# Patient Record
Sex: Male | Born: 1940
Health system: Southern US, Community
[De-identification: ages and names within clinical notes are randomized; demographics above are authoritative.]

## PROBLEM LIST (undated history)

## (undated) DIAGNOSIS — E785 Hyperlipidemia, unspecified: Secondary | ICD-10-CM

## (undated) DIAGNOSIS — R55 Syncope and collapse: Secondary | ICD-10-CM

## (undated) DIAGNOSIS — M1990 Unspecified osteoarthritis, unspecified site: Secondary | ICD-10-CM

## (undated) DIAGNOSIS — I77810 Thoracic aortic ectasia: Secondary | ICD-10-CM

## (undated) DIAGNOSIS — F32A Depression, unspecified: Secondary | ICD-10-CM

## (undated) DIAGNOSIS — R0789 Other chest pain: Secondary | ICD-10-CM

## (undated) DIAGNOSIS — I2699 Other pulmonary embolism without acute cor pulmonale: Secondary | ICD-10-CM

## (undated) DIAGNOSIS — F329 Major depressive disorder, single episode, unspecified: Secondary | ICD-10-CM

## (undated) DIAGNOSIS — Z8639 Personal history of other endocrine, nutritional and metabolic disease: Secondary | ICD-10-CM

## (undated) DIAGNOSIS — M1A9XX0 Chronic gout, unspecified, without tophus (tophi): Secondary | ICD-10-CM

## (undated) DIAGNOSIS — K2970 Gastritis, unspecified, without bleeding: Secondary | ICD-10-CM

## (undated) DIAGNOSIS — J4520 Mild intermittent asthma, uncomplicated: Secondary | ICD-10-CM

## (undated) DIAGNOSIS — I10 Essential (primary) hypertension: Secondary | ICD-10-CM

## (undated) DIAGNOSIS — M259 Joint disorder, unspecified: Secondary | ICD-10-CM

## (undated) DIAGNOSIS — K08109 Complete loss of teeth, unspecified cause, unspecified class: Secondary | ICD-10-CM

## (undated) DIAGNOSIS — N400 Enlarged prostate without lower urinary tract symptoms: Secondary | ICD-10-CM

## (undated) DIAGNOSIS — N401 Enlarged prostate with lower urinary tract symptoms: Secondary | ICD-10-CM

## (undated) DIAGNOSIS — K529 Noninfective gastroenteritis and colitis, unspecified: Secondary | ICD-10-CM

## (undated) DIAGNOSIS — E7800 Pure hypercholesterolemia, unspecified: Secondary | ICD-10-CM

## (undated) DIAGNOSIS — Z973 Presence of spectacles and contact lenses: Secondary | ICD-10-CM

## (undated) DIAGNOSIS — Z8719 Personal history of other diseases of the digestive system: Secondary | ICD-10-CM

## (undated) DIAGNOSIS — J45909 Unspecified asthma, uncomplicated: Secondary | ICD-10-CM

## (undated) DIAGNOSIS — K5792 Diverticulitis of intestine, part unspecified, without perforation or abscess without bleeding: Secondary | ICD-10-CM

## (undated) HISTORY — DX: Depression, unspecified: F32.A

## (undated) HISTORY — DX: Benign prostatic hyperplasia without lower urinary tract symptoms: N40.0

## (undated) HISTORY — DX: Thoracic aortic ectasia: I77.810

## (undated) HISTORY — DX: Syncope and collapse: R55

## (undated) HISTORY — DX: Unspecified osteoarthritis, unspecified site: M19.90

## (undated) HISTORY — DX: Personal history of other endocrine, nutritional and metabolic disease: Z86.39

## (undated) HISTORY — DX: Joint disorder, unspecified: M25.9

## (undated) HISTORY — DX: Other pulmonary embolism without acute cor pulmonale: I26.99

## (undated) HISTORY — DX: Pure hypercholesterolemia, unspecified: E78.00

## (undated) HISTORY — DX: Major depressive disorder, single episode, unspecified: F32.9

## (undated) HISTORY — DX: Gastritis, unspecified, without bleeding: K29.70

## (undated) HISTORY — DX: Diverticulitis of intestine, part unspecified, without perforation or abscess without bleeding: K57.92

## (undated) HISTORY — DX: Other chest pain: R07.89

## (undated) HISTORY — DX: Noninfective gastroenteritis and colitis, unspecified: K52.9

## (undated) HISTORY — DX: Unspecified asthma, uncomplicated: J45.909

## (undated) HISTORY — DX: Essential (primary) hypertension: I10

---

## 1998-01-28 HISTORY — PX: KNEE ARTHROSCOPY: SUR90

## 1999-01-16 ENCOUNTER — Encounter: Payer: Self-pay | Admitting: Internal Medicine

## 1999-01-16 ENCOUNTER — Ambulatory Visit (HOSPITAL_COMMUNITY): Admission: RE | Admit: 1999-01-16 | Discharge: 1999-01-16 | Payer: Self-pay | Admitting: Internal Medicine

## 1999-04-03 ENCOUNTER — Encounter: Payer: Self-pay | Admitting: Emergency Medicine

## 1999-04-03 ENCOUNTER — Emergency Department (HOSPITAL_COMMUNITY): Admission: EM | Admit: 1999-04-03 | Discharge: 1999-04-03 | Payer: Self-pay | Admitting: Emergency Medicine

## 2000-04-30 ENCOUNTER — Encounter: Payer: Self-pay | Admitting: Emergency Medicine

## 2000-04-30 ENCOUNTER — Emergency Department (HOSPITAL_COMMUNITY): Admission: EM | Admit: 2000-04-30 | Discharge: 2000-04-30 | Payer: Self-pay | Admitting: Emergency Medicine

## 2000-05-27 ENCOUNTER — Encounter: Payer: Self-pay | Admitting: Orthopedic Surgery

## 2000-05-27 ENCOUNTER — Encounter: Admission: RE | Admit: 2000-05-27 | Discharge: 2000-05-27 | Payer: Self-pay | Admitting: Orthopedic Surgery

## 2001-07-02 ENCOUNTER — Encounter: Admission: RE | Admit: 2001-07-02 | Discharge: 2001-07-02 | Payer: Self-pay | Admitting: Internal Medicine

## 2001-09-01 ENCOUNTER — Encounter: Admission: RE | Admit: 2001-09-01 | Discharge: 2001-09-01 | Payer: Self-pay | Admitting: Internal Medicine

## 2001-09-07 ENCOUNTER — Encounter: Admission: RE | Admit: 2001-09-07 | Discharge: 2001-09-07 | Payer: Self-pay | Admitting: Internal Medicine

## 2001-09-15 ENCOUNTER — Encounter: Admission: RE | Admit: 2001-09-15 | Discharge: 2001-09-15 | Payer: Self-pay | Admitting: Internal Medicine

## 2001-10-09 ENCOUNTER — Encounter: Admission: RE | Admit: 2001-10-09 | Discharge: 2001-10-09 | Payer: Self-pay | Admitting: Internal Medicine

## 2002-01-05 ENCOUNTER — Encounter: Admission: RE | Admit: 2002-01-05 | Discharge: 2002-01-05 | Payer: Self-pay | Admitting: Internal Medicine

## 2002-01-15 ENCOUNTER — Encounter: Admission: RE | Admit: 2002-01-15 | Discharge: 2002-01-15 | Payer: Self-pay | Admitting: Internal Medicine

## 2002-02-02 ENCOUNTER — Encounter: Admission: RE | Admit: 2002-02-02 | Discharge: 2002-02-02 | Payer: Self-pay | Admitting: Internal Medicine

## 2002-02-09 ENCOUNTER — Ambulatory Visit (HOSPITAL_COMMUNITY): Admission: RE | Admit: 2002-02-09 | Discharge: 2002-02-09 | Payer: Self-pay | Admitting: Internal Medicine

## 2002-02-09 ENCOUNTER — Encounter: Admission: RE | Admit: 2002-02-09 | Discharge: 2002-02-09 | Payer: Self-pay | Admitting: Internal Medicine

## 2002-04-12 ENCOUNTER — Encounter: Admission: RE | Admit: 2002-04-12 | Discharge: 2002-04-12 | Payer: Self-pay | Admitting: Internal Medicine

## 2002-04-26 ENCOUNTER — Encounter: Admission: RE | Admit: 2002-04-26 | Discharge: 2002-04-26 | Payer: Self-pay | Admitting: Internal Medicine

## 2002-04-26 ENCOUNTER — Ambulatory Visit (HOSPITAL_COMMUNITY): Admission: RE | Admit: 2002-04-26 | Discharge: 2002-04-26 | Payer: Self-pay | Admitting: Internal Medicine

## 2002-04-26 ENCOUNTER — Encounter: Payer: Self-pay | Admitting: Internal Medicine

## 2002-05-04 ENCOUNTER — Encounter: Admission: RE | Admit: 2002-05-04 | Discharge: 2002-05-04 | Payer: Self-pay | Admitting: Internal Medicine

## 2002-06-16 ENCOUNTER — Encounter: Admission: RE | Admit: 2002-06-16 | Discharge: 2002-06-16 | Payer: Self-pay | Admitting: Internal Medicine

## 2002-08-11 ENCOUNTER — Ambulatory Visit (HOSPITAL_COMMUNITY): Admission: RE | Admit: 2002-08-11 | Discharge: 2002-08-11 | Payer: Self-pay | Admitting: Internal Medicine

## 2002-08-11 ENCOUNTER — Encounter: Admission: RE | Admit: 2002-08-11 | Discharge: 2002-08-11 | Payer: Self-pay | Admitting: Internal Medicine

## 2002-08-11 ENCOUNTER — Encounter: Payer: Self-pay | Admitting: Internal Medicine

## 2002-08-19 ENCOUNTER — Encounter: Payer: Self-pay | Admitting: Internal Medicine

## 2002-08-19 ENCOUNTER — Ambulatory Visit (HOSPITAL_COMMUNITY): Admission: RE | Admit: 2002-08-19 | Discharge: 2002-08-19 | Payer: Self-pay | Admitting: Internal Medicine

## 2002-09-01 ENCOUNTER — Encounter: Admission: RE | Admit: 2002-09-01 | Discharge: 2002-09-01 | Payer: Self-pay | Admitting: Internal Medicine

## 2002-12-29 ENCOUNTER — Encounter: Admission: RE | Admit: 2002-12-29 | Discharge: 2002-12-29 | Payer: Self-pay | Admitting: Internal Medicine

## 2003-01-29 DIAGNOSIS — Z8719 Personal history of other diseases of the digestive system: Secondary | ICD-10-CM

## 2003-01-29 HISTORY — DX: Personal history of other diseases of the digestive system: Z87.19

## 2003-03-23 ENCOUNTER — Ambulatory Visit (HOSPITAL_COMMUNITY): Admission: RE | Admit: 2003-03-23 | Discharge: 2003-03-23 | Payer: Self-pay | Admitting: Internal Medicine

## 2003-03-23 ENCOUNTER — Encounter: Admission: RE | Admit: 2003-03-23 | Discharge: 2003-03-23 | Payer: Self-pay | Admitting: Internal Medicine

## 2003-03-29 HISTORY — PX: TRANSURETHRAL RESECTION OF PROSTATE: SHX73

## 2003-03-30 ENCOUNTER — Emergency Department (HOSPITAL_COMMUNITY): Admission: EM | Admit: 2003-03-30 | Discharge: 2003-03-30 | Payer: Self-pay | Admitting: Emergency Medicine

## 2003-04-01 ENCOUNTER — Inpatient Hospital Stay (HOSPITAL_COMMUNITY): Admission: RE | Admit: 2003-04-01 | Discharge: 2003-04-03 | Payer: Self-pay | Admitting: Urology

## 2003-04-01 ENCOUNTER — Encounter (INDEPENDENT_AMBULATORY_CARE_PROVIDER_SITE_OTHER): Payer: Self-pay | Admitting: *Deleted

## 2003-04-01 HISTORY — PX: TRANSURETHRAL RESECTION OF PROSTATE: SHX73

## 2003-08-25 ENCOUNTER — Encounter: Admission: RE | Admit: 2003-08-25 | Discharge: 2003-08-25 | Payer: Self-pay | Admitting: Internal Medicine

## 2003-10-12 ENCOUNTER — Ambulatory Visit: Payer: Self-pay | Admitting: Internal Medicine

## 2003-10-12 ENCOUNTER — Ambulatory Visit (HOSPITAL_COMMUNITY): Admission: RE | Admit: 2003-10-12 | Discharge: 2003-10-12 | Payer: Self-pay | Admitting: Internal Medicine

## 2004-01-13 ENCOUNTER — Ambulatory Visit (HOSPITAL_COMMUNITY): Admission: RE | Admit: 2004-01-13 | Discharge: 2004-01-13 | Payer: Self-pay | Admitting: Gastroenterology

## 2004-01-13 ENCOUNTER — Encounter (INDEPENDENT_AMBULATORY_CARE_PROVIDER_SITE_OTHER): Payer: Self-pay | Admitting: *Deleted

## 2004-03-10 ENCOUNTER — Emergency Department (HOSPITAL_COMMUNITY): Admission: EM | Admit: 2004-03-10 | Discharge: 2004-03-10 | Payer: Self-pay | Admitting: Emergency Medicine

## 2004-05-16 ENCOUNTER — Ambulatory Visit: Payer: Self-pay | Admitting: Internal Medicine

## 2004-07-08 ENCOUNTER — Emergency Department (HOSPITAL_COMMUNITY): Admission: EM | Admit: 2004-07-08 | Discharge: 2004-07-08 | Payer: Self-pay | Admitting: Emergency Medicine

## 2004-07-12 ENCOUNTER — Ambulatory Visit: Payer: Self-pay | Admitting: Internal Medicine

## 2004-10-07 ENCOUNTER — Emergency Department (HOSPITAL_COMMUNITY): Admission: EM | Admit: 2004-10-07 | Discharge: 2004-10-07 | Payer: Self-pay | Admitting: Emergency Medicine

## 2004-10-30 ENCOUNTER — Ambulatory Visit: Payer: Self-pay | Admitting: Hospitalist

## 2004-10-31 ENCOUNTER — Encounter (INDEPENDENT_AMBULATORY_CARE_PROVIDER_SITE_OTHER): Payer: Self-pay | Admitting: Hospitalist

## 2005-04-08 ENCOUNTER — Ambulatory Visit: Payer: Self-pay | Admitting: Internal Medicine

## 2005-07-01 ENCOUNTER — Ambulatory Visit (HOSPITAL_COMMUNITY): Admission: RE | Admit: 2005-07-01 | Discharge: 2005-07-01 | Payer: Self-pay | Admitting: Internal Medicine

## 2005-07-23 ENCOUNTER — Ambulatory Visit (HOSPITAL_COMMUNITY): Admission: RE | Admit: 2005-07-23 | Discharge: 2005-07-23 | Payer: Self-pay | Admitting: Internal Medicine

## 2005-07-23 ENCOUNTER — Ambulatory Visit: Payer: Self-pay | Admitting: Internal Medicine

## 2005-08-01 ENCOUNTER — Encounter: Admission: RE | Admit: 2005-08-01 | Discharge: 2005-08-28 | Payer: Self-pay | Admitting: Internal Medicine

## 2005-10-03 ENCOUNTER — Ambulatory Visit (HOSPITAL_COMMUNITY): Admission: RE | Admit: 2005-10-03 | Discharge: 2005-10-03 | Payer: Self-pay | Admitting: Hospitalist

## 2005-10-03 ENCOUNTER — Ambulatory Visit: Payer: Self-pay | Admitting: Hospitalist

## 2005-10-23 ENCOUNTER — Encounter: Admission: RE | Admit: 2005-10-23 | Discharge: 2005-10-23 | Payer: Self-pay | Admitting: Family Medicine

## 2005-12-12 ENCOUNTER — Encounter (INDEPENDENT_AMBULATORY_CARE_PROVIDER_SITE_OTHER): Payer: Self-pay | Admitting: Hospitalist

## 2005-12-12 DIAGNOSIS — I10 Essential (primary) hypertension: Secondary | ICD-10-CM | POA: Insufficient documentation

## 2005-12-12 DIAGNOSIS — Z8679 Personal history of other diseases of the circulatory system: Secondary | ICD-10-CM | POA: Insufficient documentation

## 2005-12-12 HISTORY — DX: Essential (primary) hypertension: I10

## 2005-12-12 HISTORY — DX: Personal history of other diseases of the circulatory system: Z86.79

## 2006-02-17 DIAGNOSIS — M1990 Unspecified osteoarthritis, unspecified site: Secondary | ICD-10-CM | POA: Insufficient documentation

## 2006-05-25 ENCOUNTER — Emergency Department (HOSPITAL_COMMUNITY): Admission: EM | Admit: 2006-05-25 | Discharge: 2006-05-25 | Payer: Self-pay | Admitting: *Deleted

## 2006-05-29 ENCOUNTER — Encounter (INDEPENDENT_AMBULATORY_CARE_PROVIDER_SITE_OTHER): Payer: Self-pay | Admitting: Pulmonary Disease

## 2006-05-29 ENCOUNTER — Ambulatory Visit: Payer: Self-pay | Admitting: Internal Medicine

## 2006-05-29 LAB — CONVERTED CEMR LAB
CO2: 28 meq/L (ref 19–32)
Calcium: 9.7 mg/dL (ref 8.4–10.5)
Chloride: 98 meq/L (ref 96–112)
Glucose, Bld: 114 mg/dL — ABNORMAL HIGH (ref 70–99)
Sodium: 138 meq/L (ref 135–145)

## 2006-05-31 ENCOUNTER — Ambulatory Visit (HOSPITAL_COMMUNITY): Admission: RE | Admit: 2006-05-31 | Discharge: 2006-05-31 | Payer: Self-pay | Admitting: Internal Medicine

## 2006-06-12 ENCOUNTER — Ambulatory Visit: Payer: Self-pay | Admitting: Internal Medicine

## 2006-06-12 ENCOUNTER — Encounter (INDEPENDENT_AMBULATORY_CARE_PROVIDER_SITE_OTHER): Payer: Self-pay | Admitting: Pulmonary Disease

## 2006-06-12 LAB — CONVERTED CEMR LAB
BUN: 16 mg/dL (ref 6–23)
CO2: 28 meq/L (ref 19–32)
Calcium: 8.9 mg/dL (ref 8.4–10.5)
Creatinine, Ser: 1.22 mg/dL (ref 0.40–1.50)
Glucose, Bld: 74 mg/dL (ref 70–99)

## 2006-09-13 ENCOUNTER — Ambulatory Visit: Payer: Self-pay | Admitting: Hospitalist

## 2006-09-13 ENCOUNTER — Inpatient Hospital Stay (HOSPITAL_COMMUNITY): Admission: EM | Admit: 2006-09-13 | Discharge: 2006-09-16 | Payer: Self-pay | Admitting: Emergency Medicine

## 2006-09-13 DIAGNOSIS — Z86718 Personal history of other venous thrombosis and embolism: Secondary | ICD-10-CM

## 2006-09-13 DIAGNOSIS — Z86711 Personal history of pulmonary embolism: Secondary | ICD-10-CM | POA: Insufficient documentation

## 2006-09-13 DIAGNOSIS — I2699 Other pulmonary embolism without acute cor pulmonale: Secondary | ICD-10-CM

## 2006-09-13 HISTORY — DX: Personal history of other venous thrombosis and embolism: Z86.718

## 2006-09-13 HISTORY — DX: Other pulmonary embolism without acute cor pulmonale: I26.99

## 2006-09-13 HISTORY — DX: Personal history of pulmonary embolism: Z86.711

## 2006-09-16 ENCOUNTER — Encounter (INDEPENDENT_AMBULATORY_CARE_PROVIDER_SITE_OTHER): Payer: Self-pay | Admitting: *Deleted

## 2006-09-18 ENCOUNTER — Ambulatory Visit: Payer: Self-pay | Admitting: General Surgery

## 2006-09-22 ENCOUNTER — Ambulatory Visit: Payer: Self-pay | Admitting: Internal Medicine

## 2006-09-29 ENCOUNTER — Encounter (INDEPENDENT_AMBULATORY_CARE_PROVIDER_SITE_OTHER): Payer: Self-pay | Admitting: Hospitalist

## 2006-10-02 ENCOUNTER — Ambulatory Visit: Payer: Self-pay | Admitting: Internal Medicine

## 2006-10-02 LAB — CONVERTED CEMR LAB

## 2006-10-06 ENCOUNTER — Telehealth: Payer: Self-pay | Admitting: *Deleted

## 2006-10-07 ENCOUNTER — Encounter (INDEPENDENT_AMBULATORY_CARE_PROVIDER_SITE_OTHER): Payer: Self-pay | Admitting: *Deleted

## 2006-10-07 ENCOUNTER — Ambulatory Visit: Payer: Self-pay | Admitting: Internal Medicine

## 2006-10-20 ENCOUNTER — Encounter (INDEPENDENT_AMBULATORY_CARE_PROVIDER_SITE_OTHER): Payer: Self-pay | Admitting: Hospitalist

## 2006-10-20 ENCOUNTER — Ambulatory Visit: Payer: Self-pay | Admitting: Internal Medicine

## 2006-10-20 LAB — CONVERTED CEMR LAB: INR: 3.6

## 2006-10-21 ENCOUNTER — Encounter (INDEPENDENT_AMBULATORY_CARE_PROVIDER_SITE_OTHER): Payer: Self-pay | Admitting: Hospitalist

## 2006-11-03 ENCOUNTER — Ambulatory Visit: Payer: Self-pay | Admitting: Hospitalist

## 2006-11-03 LAB — CONVERTED CEMR LAB
ALT: 20 units/L (ref 0–53)
Albumin: 4.4 g/dL (ref 3.5–5.2)
CO2: 25 meq/L (ref 19–32)
Calcium: 9.1 mg/dL (ref 8.4–10.5)
Chloride: 99 meq/L (ref 96–112)
Cholesterol: 176 mg/dL (ref 0–200)
Glucose, Bld: 85 mg/dL (ref 70–99)
Potassium: 3.8 meq/L (ref 3.5–5.3)
Sodium: 137 meq/L (ref 135–145)
Total Protein: 7.8 g/dL (ref 6.0–8.3)
Triglycerides: 117 mg/dL (ref <150)

## 2006-11-17 ENCOUNTER — Ambulatory Visit: Payer: Self-pay | Admitting: Infectious Diseases

## 2006-11-17 LAB — CONVERTED CEMR LAB: INR: 3.7

## 2006-11-25 ENCOUNTER — Encounter (INDEPENDENT_AMBULATORY_CARE_PROVIDER_SITE_OTHER): Payer: Self-pay | Admitting: Hospitalist

## 2006-12-15 ENCOUNTER — Ambulatory Visit: Payer: Self-pay | Admitting: *Deleted

## 2006-12-15 LAB — CONVERTED CEMR LAB: INR: 3.4

## 2007-03-23 ENCOUNTER — Ambulatory Visit: Payer: Self-pay | Admitting: Internal Medicine

## 2007-03-23 LAB — CONVERTED CEMR LAB: INR: 1.9

## 2007-04-20 ENCOUNTER — Ambulatory Visit: Payer: Self-pay | Admitting: Hospitalist

## 2007-04-20 LAB — CONVERTED CEMR LAB

## 2007-06-01 ENCOUNTER — Telehealth: Payer: Self-pay | Admitting: *Deleted

## 2007-06-08 ENCOUNTER — Ambulatory Visit: Payer: Self-pay | Admitting: Internal Medicine

## 2007-06-08 LAB — CONVERTED CEMR LAB: INR: 2.8

## 2007-07-13 ENCOUNTER — Ambulatory Visit: Payer: Self-pay | Admitting: Internal Medicine

## 2007-08-04 ENCOUNTER — Emergency Department (HOSPITAL_COMMUNITY): Admission: EM | Admit: 2007-08-04 | Discharge: 2007-08-04 | Payer: Self-pay | Admitting: Emergency Medicine

## 2007-08-10 ENCOUNTER — Ambulatory Visit: Payer: Self-pay | Admitting: Internal Medicine

## 2007-08-10 LAB — CONVERTED CEMR LAB: INR: 2.2

## 2007-11-18 ENCOUNTER — Ambulatory Visit (HOSPITAL_COMMUNITY): Admission: RE | Admit: 2007-11-18 | Discharge: 2007-11-18 | Payer: Self-pay | Admitting: *Deleted

## 2007-11-18 ENCOUNTER — Ambulatory Visit: Payer: Self-pay | Admitting: *Deleted

## 2007-11-19 ENCOUNTER — Encounter (INDEPENDENT_AMBULATORY_CARE_PROVIDER_SITE_OTHER): Payer: Self-pay | Admitting: *Deleted

## 2007-11-20 ENCOUNTER — Ambulatory Visit: Payer: Self-pay | Admitting: Cardiology

## 2007-12-04 ENCOUNTER — Ambulatory Visit: Payer: Self-pay

## 2007-12-04 ENCOUNTER — Ambulatory Visit: Payer: Self-pay | Admitting: Cardiology

## 2007-12-04 ENCOUNTER — Telehealth (INDEPENDENT_AMBULATORY_CARE_PROVIDER_SITE_OTHER): Payer: Self-pay | Admitting: Pharmacist

## 2007-12-04 ENCOUNTER — Encounter (INDEPENDENT_AMBULATORY_CARE_PROVIDER_SITE_OTHER): Payer: Self-pay | Admitting: *Deleted

## 2007-12-07 ENCOUNTER — Ambulatory Visit: Payer: Self-pay | Admitting: *Deleted

## 2008-01-04 ENCOUNTER — Ambulatory Visit: Payer: Self-pay | Admitting: Internal Medicine

## 2008-01-04 LAB — CONVERTED CEMR LAB: INR: 2.5

## 2008-02-29 ENCOUNTER — Telehealth: Payer: Self-pay | Admitting: *Deleted

## 2008-03-24 ENCOUNTER — Ambulatory Visit: Payer: Self-pay | Admitting: Internal Medicine

## 2008-03-24 LAB — CONVERTED CEMR LAB: INR: 2.5

## 2008-03-30 ENCOUNTER — Telehealth (INDEPENDENT_AMBULATORY_CARE_PROVIDER_SITE_OTHER): Payer: Self-pay | Admitting: *Deleted

## 2008-05-04 ENCOUNTER — Telehealth (INDEPENDENT_AMBULATORY_CARE_PROVIDER_SITE_OTHER): Payer: Self-pay | Admitting: *Deleted

## 2008-05-09 ENCOUNTER — Ambulatory Visit: Payer: Self-pay | Admitting: Internal Medicine

## 2008-05-09 LAB — CONVERTED CEMR LAB

## 2008-05-17 ENCOUNTER — Ambulatory Visit: Payer: Self-pay | Admitting: *Deleted

## 2008-05-17 LAB — CONVERTED CEMR LAB
BUN: 17 mg/dL (ref 6–23)
Calcium: 9.4 mg/dL (ref 8.4–10.5)
Creatinine, Ser: 1.21 mg/dL (ref 0.40–1.50)
GFR calc Af Amer: >60 mL/min (ref >60)
GFR calc non Af Amer: 60 mL/min — ABNORMAL LOW (ref >60)

## 2008-06-03 ENCOUNTER — Ambulatory Visit: Payer: Self-pay | Admitting: Infectious Disease

## 2008-06-03 ENCOUNTER — Encounter (INDEPENDENT_AMBULATORY_CARE_PROVIDER_SITE_OTHER): Payer: Self-pay | Admitting: Internal Medicine

## 2008-06-03 LAB — CONVERTED CEMR LAB
CO2: 31 meq/L (ref 19–32)
Calcium: 9.2 mg/dL (ref 8.4–10.5)
Creatinine, Ser: 1.1 mg/dL (ref 0.40–1.50)
Glucose, Bld: 77 mg/dL (ref 70–99)

## 2008-06-06 ENCOUNTER — Ambulatory Visit: Payer: Self-pay | Admitting: Vascular Surgery

## 2008-06-06 ENCOUNTER — Ambulatory Visit (HOSPITAL_COMMUNITY): Admission: RE | Admit: 2008-06-06 | Discharge: 2008-06-06 | Payer: Self-pay | Admitting: Infectious Disease

## 2008-06-06 ENCOUNTER — Ambulatory Visit: Payer: Self-pay | Admitting: *Deleted

## 2008-06-06 ENCOUNTER — Encounter: Payer: Self-pay | Admitting: Infectious Disease

## 2008-06-10 ENCOUNTER — Ambulatory Visit: Payer: Self-pay | Admitting: Internal Medicine

## 2008-06-10 ENCOUNTER — Encounter (INDEPENDENT_AMBULATORY_CARE_PROVIDER_SITE_OTHER): Payer: Self-pay | Admitting: Internal Medicine

## 2008-06-10 LAB — CONVERTED CEMR LAB
Monocyte/Macrophage: 12 % — ABNORMAL LOW (ref 50–90)
Neutrophil, Synovial: 75 % — ABNORMAL HIGH (ref 0–25)
WBC, Synovial: 14510 — ABNORMAL HIGH (ref 0–200)

## 2008-06-14 ENCOUNTER — Encounter (INDEPENDENT_AMBULATORY_CARE_PROVIDER_SITE_OTHER): Payer: Self-pay | Admitting: *Deleted

## 2008-07-11 ENCOUNTER — Ambulatory Visit: Payer: Self-pay | Admitting: Internal Medicine

## 2008-07-11 LAB — CONVERTED CEMR LAB: INR: 2.1

## 2008-09-19 ENCOUNTER — Ambulatory Visit: Payer: Self-pay | Admitting: Internal Medicine

## 2008-11-07 ENCOUNTER — Ambulatory Visit: Payer: Self-pay | Admitting: Internal Medicine

## 2008-11-07 LAB — CONVERTED CEMR LAB: INR: 2.9

## 2008-11-15 ENCOUNTER — Telehealth: Payer: Self-pay | Admitting: Internal Medicine

## 2008-12-05 ENCOUNTER — Ambulatory Visit: Payer: Self-pay | Admitting: Internal Medicine

## 2008-12-05 LAB — CONVERTED CEMR LAB: INR: 2.6

## 2009-01-10 ENCOUNTER — Ambulatory Visit: Payer: Self-pay | Admitting: Internal Medicine

## 2009-01-10 LAB — CONVERTED CEMR LAB
BUN: 17 mg/dL (ref 6–23)
Chloride: 103 meq/L (ref 96–112)
Cholesterol: 153 mg/dL (ref 0–200)
Eosinophils Relative: 4 % (ref 0–5)
HCT: 38.8 % — ABNORMAL LOW (ref 39.0–52.0)
HDL: 34 mg/dL — ABNORMAL LOW (ref >39)
Hemoglobin: 13.5 g/dL (ref 13.0–17.0)
LDL Cholesterol: 106 mg/dL — ABNORMAL HIGH (ref 0–99)
Lymphocytes Relative: 39 % (ref 12–46)
Lymphs Abs: 2.6 10*3/uL (ref 0.7–4.0)
Neutro Abs: 3.4 10*3/uL (ref 1.7–7.7)
Platelets: 273 10*3/uL (ref 150–400)
Potassium: 3.5 meq/L (ref 3.5–5.3)
Sodium: 141 meq/L (ref 135–145)
Total CHOL/HDL Ratio: 4.5
Triglycerides: 64 mg/dL (ref <150)
VLDL: 13 mg/dL (ref 0–40)
WBC: 6.6 10*3/uL (ref 4.0–10.5)

## 2009-02-13 ENCOUNTER — Ambulatory Visit: Payer: Self-pay | Admitting: Internal Medicine

## 2009-02-13 DIAGNOSIS — F329 Major depressive disorder, single episode, unspecified: Secondary | ICD-10-CM

## 2009-02-13 LAB — CONVERTED CEMR LAB

## 2009-02-20 ENCOUNTER — Encounter: Payer: Self-pay | Admitting: Internal Medicine

## 2009-02-20 ENCOUNTER — Telehealth (INDEPENDENT_AMBULATORY_CARE_PROVIDER_SITE_OTHER): Payer: Self-pay | Admitting: Pharmacist

## 2009-03-09 ENCOUNTER — Encounter: Payer: Self-pay | Admitting: Internal Medicine

## 2009-03-09 LAB — HM COLONOSCOPY: HM Colonoscopy: ABNORMAL

## 2009-04-03 ENCOUNTER — Ambulatory Visit: Payer: Self-pay | Admitting: Infectious Diseases

## 2009-04-03 LAB — CONVERTED CEMR LAB: INR: 1.9

## 2009-04-24 ENCOUNTER — Ambulatory Visit: Payer: Self-pay | Admitting: Infectious Diseases

## 2009-04-24 LAB — CONVERTED CEMR LAB: INR: 2.4

## 2009-05-01 ENCOUNTER — Telehealth: Payer: Self-pay | Admitting: Internal Medicine

## 2009-06-12 ENCOUNTER — Ambulatory Visit: Payer: Self-pay | Admitting: Internal Medicine

## 2009-06-12 LAB — CONVERTED CEMR LAB: INR: 2.8

## 2009-07-10 ENCOUNTER — Ambulatory Visit: Payer: Self-pay | Admitting: Internal Medicine

## 2009-07-10 LAB — CONVERTED CEMR LAB: INR: 4.4

## 2009-08-07 ENCOUNTER — Ambulatory Visit: Payer: Self-pay | Admitting: Internal Medicine

## 2009-08-28 ENCOUNTER — Ambulatory Visit: Payer: Self-pay | Admitting: Internal Medicine

## 2009-11-13 ENCOUNTER — Ambulatory Visit: Payer: Self-pay | Admitting: Internal Medicine

## 2009-11-13 LAB — CONVERTED CEMR LAB: INR: 2.3

## 2009-12-01 ENCOUNTER — Telehealth: Payer: Self-pay | Admitting: Internal Medicine

## 2009-12-11 ENCOUNTER — Ambulatory Visit: Payer: Self-pay | Admitting: Internal Medicine

## 2009-12-11 LAB — CONVERTED CEMR LAB

## 2010-01-08 ENCOUNTER — Ambulatory Visit: Payer: Self-pay | Admitting: Internal Medicine

## 2010-01-28 DIAGNOSIS — I77810 Thoracic aortic ectasia: Secondary | ICD-10-CM

## 2010-01-28 HISTORY — DX: Thoracic aortic ectasia: I77.810

## 2010-01-31 ENCOUNTER — Telehealth: Payer: Self-pay | Admitting: Internal Medicine

## 2010-02-05 ENCOUNTER — Ambulatory Visit: Admission: RE | Admit: 2010-02-05 | Discharge: 2010-02-05 | Payer: Self-pay | Source: Home / Self Care

## 2010-02-05 ENCOUNTER — Encounter: Payer: Self-pay | Admitting: Internal Medicine

## 2010-02-05 LAB — CONVERTED CEMR LAB
ALT: 17 units/L (ref 0–53)
Alkaline Phosphatase: 60 units/L (ref 39–117)
Basophils Relative: 1 % (ref 0–1)
Bilirubin Urine: NEGATIVE
CO2: 24 meq/L (ref 19–32)
Creatinine, Ser: 1.22 mg/dL (ref 0.40–1.50)
Eosinophils Absolute: 0.4 10*3/uL (ref 0.0–0.7)
Eosinophils Relative: 8 % — ABNORMAL HIGH (ref 0–5)
Glucose, Bld: 89 mg/dL (ref 70–99)
HCT: 38.6 % — ABNORMAL LOW (ref 39.0–52.0)
Ketones, ur: NEGATIVE mg/dL
Lymphs Abs: 2 10*3/uL (ref 0.7–4.0)
MCHC: 33.4 g/dL (ref 30.0–36.0)
MCV: 81.3 fL (ref 78.0–100.0)
Monocytes Relative: 7 % (ref 3–12)
RBC: 4.75 M/uL (ref 4.22–5.81)
Sodium: 136 meq/L (ref 135–145)
Specific Gravity, Urine: 1.023 (ref 1.005–1.03)
Total Bilirubin: 0.4 mg/dL (ref 0.3–1.2)
Total Protein: 7.8 g/dL (ref 6.0–8.3)
Urine Glucose: NEGATIVE mg/dL
WBC: 5.1 10*3/uL (ref 4.0–10.5)
pH: 6 (ref 5.0–8.0)

## 2010-02-08 ENCOUNTER — Emergency Department (HOSPITAL_COMMUNITY)
Admission: EM | Admit: 2010-02-08 | Discharge: 2010-02-09 | Payer: Self-pay | Source: Home / Self Care | Admitting: Emergency Medicine

## 2010-02-09 ENCOUNTER — Telehealth: Payer: Self-pay | Admitting: Internal Medicine

## 2010-02-09 ENCOUNTER — Encounter: Payer: Self-pay | Admitting: Internal Medicine

## 2010-02-10 ENCOUNTER — Telehealth: Payer: Self-pay | Admitting: Internal Medicine

## 2010-02-10 ENCOUNTER — Encounter: Payer: Self-pay | Admitting: Ophthalmology

## 2010-02-10 ENCOUNTER — Inpatient Hospital Stay (HOSPITAL_COMMUNITY)
Admission: EM | Admit: 2010-02-10 | Discharge: 2010-02-14 | Payer: Self-pay | Source: Home / Self Care | Attending: Internal Medicine | Admitting: Internal Medicine

## 2010-02-10 DIAGNOSIS — Z87898 Personal history of other specified conditions: Secondary | ICD-10-CM

## 2010-02-10 HISTORY — DX: Personal history of other specified conditions: Z87.898

## 2010-02-12 ENCOUNTER — Encounter: Payer: Self-pay | Admitting: Internal Medicine

## 2010-02-12 LAB — CARDIAC PANEL(CRET KIN+CKTOT+MB+TROPI)
CK, MB: 2.2 ng/mL (ref 0.3–4.0)
CK, MB: 1.6 ng/mL (ref 0.3–4.0)
CK, MB: 1.1 ng/mL (ref 0.3–4.0)
Relative Index: 1.6 (ref 0.0–2.5)
Relative Index: 1.2 (ref 0.0–2.5)
Relative Index: 0.9 (ref 0.0–2.5)
Total CK: 134 U/L (ref 7–232)
Total CK: 129 U/L (ref 7–232)
Total CK: 118 U/L (ref 7–232)
Troponin I: <0.01 ng/mL (ref 0.00–0.06)
Troponin I: 0.01 ng/mL (ref 0.00–0.06)
Troponin I: 0.01 ng/mL (ref 0.00–0.06)

## 2010-02-12 LAB — LIPID PANEL
Cholesterol: 116 mg/dL (ref 0–200)
HDL: 23 mg/dL — ABNORMAL LOW (ref >39)
LDL Cholesterol: 79 mg/dL (ref 0–99)
Total CHOL/HDL Ratio: 5 RATIO
Triglycerides: 68 mg/dL (ref <150)
VLDL: 14 mg/dL (ref 0–40)

## 2010-02-12 LAB — POCT I-STAT, CHEM 8
BUN: 21 mg/dL (ref 6–23)
Calcium, Ion: 1.04 mmol/L — ABNORMAL LOW (ref 1.12–1.32)
Chloride: 101 mEq/L (ref 96–112)
Creatinine, Ser: 1.4 mg/dL (ref 0.4–1.5)
Glucose, Bld: 90 mg/dL (ref 70–99)
HCT: 37 % — ABNORMAL LOW (ref 39.0–52.0)
Hemoglobin: 12.6 g/dL — ABNORMAL LOW (ref 13.0–17.0)
Potassium: 4 mEq/L (ref 3.5–5.1)
Sodium: 133 mEq/L — ABNORMAL LOW (ref 135–145)
TCO2: 25 mmol/L (ref 0–100)

## 2010-02-12 LAB — CBC
HCT: 36.5 % — ABNORMAL LOW (ref 39.0–52.0)
HCT: 34.8 % — ABNORMAL LOW (ref 39.0–52.0)
HCT: 33.7 % — ABNORMAL LOW (ref 39.0–52.0)
Hemoglobin: 12.1 g/dL — ABNORMAL LOW (ref 13.0–17.0)
Hemoglobin: 11.6 g/dL — ABNORMAL LOW (ref 13.0–17.0)
Hemoglobin: 11.4 g/dL — ABNORMAL LOW (ref 13.0–17.0)
MCH: 26.5 pg (ref 26.0–34.0)
MCH: 26.5 pg (ref 26.0–34.0)
MCH: 27.1 pg (ref 26.0–34.0)
MCHC: 33.2 g/dL (ref 30.0–36.0)
MCHC: 33.3 g/dL (ref 30.0–36.0)
MCHC: 33.8 g/dL (ref 30.0–36.0)
MCV: 79.9 fL (ref 78.0–100.0)
MCV: 79.5 fL (ref 78.0–100.0)
MCV: 80 fL (ref 78.0–100.0)
Platelets: 194 10*3/uL (ref 150–400)
Platelets: 196 10*3/uL (ref 150–400)
Platelets: 174 10*3/uL (ref 150–400)
RBC: 4.57 MIL/uL (ref 4.22–5.81)
RBC: 4.38 MIL/uL (ref 4.22–5.81)
RBC: 4.21 MIL/uL — ABNORMAL LOW (ref 4.22–5.81)
RDW: 13.5 % (ref 11.5–15.5)
RDW: 13.4 % (ref 11.5–15.5)
RDW: 13.6 % (ref 11.5–15.5)
WBC: 4.7 10*3/uL (ref 4.0–10.5)
WBC: 3.6 10*3/uL — ABNORMAL LOW (ref 4.0–10.5)
WBC: 3 10*3/uL — ABNORMAL LOW (ref 4.0–10.5)

## 2010-02-12 LAB — COMPREHENSIVE METABOLIC PANEL
ALT: 19 U/L (ref 0–53)
ALT: 19 U/L (ref 0–53)
AST: 27 U/L (ref 0–37)
AST: 28 U/L (ref 0–37)
Albumin: 3.9 g/dL (ref 3.5–5.2)
Albumin: 3 g/dL — ABNORMAL LOW (ref 3.5–5.2)
Alkaline Phosphatase: 56 U/L (ref 39–117)
Alkaline Phosphatase: 51 U/L (ref 39–117)
BUN: 22 mg/dL (ref 6–23)
BUN: 11 mg/dL (ref 6–23)
CO2: 23 mEq/L (ref 19–32)
CO2: 23 mEq/L (ref 19–32)
Calcium: 9 mg/dL (ref 8.4–10.5)
Calcium: 8.3 mg/dL — ABNORMAL LOW (ref 8.4–10.5)
Chloride: 100 mEq/L (ref 96–112)
Chloride: 108 mEq/L (ref 96–112)
Creatinine, Ser: 1.31 mg/dL (ref 0.4–1.5)
Creatinine, Ser: 1.15 mg/dL (ref 0.4–1.5)
GFR calc Af Amer: >60 mL/min (ref >60)
GFR calc Af Amer: >60 mL/min (ref >60)
GFR calc non Af Amer: 54 mL/min — ABNORMAL LOW (ref >60)
GFR calc non Af Amer: >60 mL/min (ref >60)
Glucose, Bld: 83 mg/dL (ref 70–99)
Glucose, Bld: 79 mg/dL (ref 70–99)
Potassium: 4.1 mEq/L (ref 3.5–5.1)
Potassium: 4 mEq/L (ref 3.5–5.1)
Sodium: 133 mEq/L — ABNORMAL LOW (ref 135–145)
Sodium: 138 mEq/L (ref 135–145)
Total Bilirubin: 0.7 mg/dL (ref 0.3–1.2)
Total Bilirubin: 0.6 mg/dL (ref 0.3–1.2)
Total Protein: 7.1 g/dL (ref 6.0–8.3)
Total Protein: 5.8 g/dL — ABNORMAL LOW (ref 6.0–8.3)

## 2010-02-12 LAB — URINALYSIS, ROUTINE W REFLEX MICROSCOPIC
Bilirubin Urine: NEGATIVE
Hgb urine dipstick: NEGATIVE
Ketones, ur: 15 mg/dL — AB
Nitrite: NEGATIVE
Protein, ur: NEGATIVE mg/dL
Specific Gravity, Urine: 1.019 (ref 1.005–1.030)
Urine Glucose, Fasting: NEGATIVE mg/dL
Urobilinogen, UA: 1 mg/dL (ref 0.0–1.0)
pH: 5.5 (ref 5.0–8.0)

## 2010-02-12 LAB — DIFFERENTIAL
Basophils Absolute: 0 10*3/uL (ref 0.0–0.1)
Basophils Absolute: 0 10*3/uL (ref 0.0–0.1)
Basophils Relative: 0 % (ref 0–1)
Basophils Relative: 0 % (ref 0–1)
Eosinophils Absolute: 0.2 10*3/uL (ref 0.0–0.7)
Eosinophils Absolute: 0.2 10*3/uL (ref 0.0–0.7)
Eosinophils Relative: 4 % (ref 0–5)
Eosinophils Relative: 4 % (ref 0–5)
Lymphocytes Relative: 28 % (ref 12–46)
Lymphocytes Relative: 32 % (ref 12–46)
Lymphs Abs: 1.3 10*3/uL (ref 0.7–4.0)
Lymphs Abs: 1.1 10*3/uL (ref 0.7–4.0)
Monocytes Absolute: 0.4 10*3/uL (ref 0.1–1.0)
Monocytes Absolute: 0.3 10*3/uL (ref 0.1–1.0)
Monocytes Relative: 9 % (ref 3–12)
Monocytes Relative: 10 % (ref 3–12)
Neutro Abs: 2.7 10*3/uL (ref 1.7–7.7)
Neutro Abs: 1.9 10*3/uL (ref 1.7–7.7)
Neutrophils Relative %: 59 % (ref 43–77)
Neutrophils Relative %: 54 % (ref 43–77)

## 2010-02-12 LAB — PROTIME-INR
INR: 2.1 — ABNORMAL HIGH (ref 0.00–1.49)
INR: 2.04 — ABNORMAL HIGH (ref 0.00–1.49)
INR: 2.27 — ABNORMAL HIGH (ref 0.00–1.49)
Prothrombin Time: 23.7 seconds — ABNORMAL HIGH (ref 11.6–15.2)
Prothrombin Time: 23.2 seconds — ABNORMAL HIGH (ref 11.6–15.2)
Prothrombin Time: 25.2 seconds — ABNORMAL HIGH (ref 11.6–15.2)

## 2010-02-12 LAB — POCT CARDIAC MARKERS
CKMB, poc: <1 ng/mL — ABNORMAL LOW (ref 1.0–8.0)
CKMB, poc: <1 ng/mL — ABNORMAL LOW (ref 1.0–8.0)
CKMB, poc: <1 ng/mL — ABNORMAL LOW (ref 1.0–8.0)
Myoglobin, poc: 114 ng/mL (ref 12–200)
Myoglobin, poc: 95.5 ng/mL (ref 12–200)
Myoglobin, poc: 175 ng/mL (ref 12–200)
Troponin i, poc: <0.05 ng/mL (ref 0.00–0.09)
Troponin i, poc: <0.05 ng/mL (ref 0.00–0.09)
Troponin i, poc: <0.05 ng/mL (ref 0.00–0.09)

## 2010-02-12 LAB — TSH: TSH: 1.372 u[IU]/mL (ref 0.350–4.500)

## 2010-02-12 LAB — AMMONIA: Ammonia: 32 umol/L (ref 11–35)

## 2010-02-12 LAB — D-DIMER, QUANTITATIVE: D-Dimer, Quant: 0.39 ug/mL-FEU (ref 0.00–0.48)

## 2010-02-12 LAB — LIPASE, BLOOD: Lipase: 41 U/L (ref 11–59)

## 2010-02-13 ENCOUNTER — Ambulatory Visit: Admit: 2010-02-13 | Payer: Self-pay

## 2010-02-14 ENCOUNTER — Encounter: Payer: Self-pay | Admitting: Ophthalmology

## 2010-02-14 DIAGNOSIS — Z8669 Personal history of other diseases of the nervous system and sense organs: Secondary | ICD-10-CM | POA: Insufficient documentation

## 2010-02-14 LAB — PROTIME-INR
INR: 2.58 — ABNORMAL HIGH (ref 0.00–1.49)
Prothrombin Time: 27.8 seconds — ABNORMAL HIGH (ref 11.6–15.2)

## 2010-02-14 LAB — CBC
HCT: 31.3 % — ABNORMAL LOW (ref 39.0–52.0)
Hemoglobin: 10.6 g/dL — ABNORMAL LOW (ref 13.0–17.0)
MCH: 27 pg (ref 26.0–34.0)
MCHC: 33.9 g/dL (ref 30.0–36.0)
MCV: 79.6 fL (ref 78.0–100.0)
Platelets: 182 10*3/uL (ref 150–400)
RBC: 3.93 MIL/uL — ABNORMAL LOW (ref 4.22–5.81)
RDW: 13.6 % (ref 11.5–15.5)
WBC: 3.7 10*3/uL — ABNORMAL LOW (ref 4.0–10.5)

## 2010-02-14 LAB — GLUCOSE, CAPILLARY: Glucose-Capillary: 88 mg/dL (ref 70–99)

## 2010-02-14 LAB — CARDIAC PANEL(CRET KIN+CKTOT+MB+TROPI)
CK, MB: 0.7 ng/mL (ref 0.3–4.0)
CK, MB: 0.7 ng/mL (ref 0.3–4.0)
CK, MB: 0.9 ng/mL (ref 0.3–4.0)
Relative Index: INVALID (ref 0.0–2.5)
Relative Index: INVALID (ref 0.0–2.5)
Relative Index: INVALID (ref 0.0–2.5)
Total CK: 60 U/L (ref 7–232)
Total CK: 71 U/L (ref 7–232)
Total CK: 66 U/L (ref 7–232)
Troponin I: 0.01 ng/mL (ref 0.00–0.06)
Troponin I: 0.01 ng/mL (ref 0.00–0.06)
Troponin I: <0.01 ng/mL (ref 0.00–0.06)

## 2010-02-14 LAB — FERRITIN: Ferritin: 664 ng/mL — ABNORMAL HIGH (ref 22–322)

## 2010-02-14 LAB — CORTISOL: Cortisol, Plasma: 3.5 ug/dL

## 2010-02-14 LAB — PREALBUMIN: Prealbumin: 13.8 mg/dL — ABNORMAL LOW (ref 17.0–34.0)

## 2010-02-21 ENCOUNTER — Ambulatory Visit: Admission: RE | Admit: 2010-02-21 | Discharge: 2010-02-21 | Payer: Self-pay | Source: Home / Self Care

## 2010-02-21 DIAGNOSIS — A048 Other specified bacterial intestinal infections: Secondary | ICD-10-CM | POA: Insufficient documentation

## 2010-02-21 DIAGNOSIS — E538 Deficiency of other specified B group vitamins: Secondary | ICD-10-CM | POA: Insufficient documentation

## 2010-02-21 LAB — CONVERTED CEMR LAB
BUN: 10 mg/dL (ref 6–23)
Basophils Absolute: 0 10*3/uL (ref 0.0–0.1)
Basophils Relative: 1 % (ref 0–1)
Calcium: 8.7 mg/dL (ref 8.4–10.5)
Chloride: 104 meq/L (ref 96–112)
Creatinine, Ser: 1.02 mg/dL (ref 0.40–1.50)
Eosinophils Relative: 8 % — ABNORMAL HIGH (ref 0–5)
Ferritin: 447 ng/mL — ABNORMAL HIGH (ref 22–322)
HCT: 35.1 % — ABNORMAL LOW (ref 39.0–52.0)
Hemoglobin: 11.5 g/dL — ABNORMAL LOW (ref 13.0–17.0)
MCHC: 32.8 g/dL (ref 30.0–36.0)
MCV: 82.8 fL (ref 78.0–100.0)
Monocytes Absolute: 0.5 10*3/uL (ref 0.1–1.0)
Monocytes Relative: 9 % (ref 3–12)
RBC: 4.24 M/uL (ref 4.22–5.81)
RDW: 14.8 % (ref 11.5–15.5)

## 2010-02-23 NOTE — Consult Note (Signed)
[name], [name]               ACCOUNT NO.:  [id number]  MEDICAL RECORD NO.:  [id number]           PATIENT TYPE:  LOCATION:                                 FACILITY:  PHYSICIAN:  Jordan Hawks. Elnoria Howard, MD    DATE OF BIRTH:  18-Mar-1940  DATE OF CONSULTATION:  02/11/2010 DATE OF DISCHARGE:                                CONSULTATION   This is a Teaching Service patient.  HISTORY OF PRESENT ILLNESS:  This is a 70 year old gentleman with a past medical history of PE and DVT in August 2008, requiring chronic Coumadin, hypertension, BPH, hyperlipidemia, osteoarthritis, and depression, who was admitted to the hospital with syncopal episode.  The patient states that on the day of admission he had some lightheadedness and mild diaphoresis with a resultant syncopal episode.  The patient was found unconscious by his daughter, and subsequently he was brought into the emergency room for further evaluation and treatment.  His blood pressure was noted to be in the 80s systolic with a heart rate in the 60s.  There was no evidence of any seizure which have precipitated the episode.  The patient also has some mild GI type symptoms with intermittent nausea and vomiting, no complaints of any dysphagia.  There is a report of weight loss over a year's time of approximately 60-65 pounds.  He did lose his wife in 2009 and does suffer from depression as a resolved, and there is a report that his depression appears to be worsening.  Currently, the patient feels better after IV hydration and feels well at this time.  No complaints of any nausea and vomiting currently.  No reports of any diarrhea, constipation, hematochezia, or melena.  Because of the patient's weight loss history, some nausea and vomiting issues, GI consultation was requested.  PAST MEDICAL HISTORY AND PAST SURGICAL HISTORY:  As stated above.  FAMILY HISTORY:  Noncontributory.  SOCIAL HISTORY:  Negative for alcohol, tobacco, or illicit  drug use.  REVIEW OF SYSTEMS:  As stated above in history of present illness, otherwise negative.  MEDICATIONS: 1. Aspirin 81 mg p.o. daily. 2. Celexa 20 mg p.o. daily. 3. Remeron 50 mg p.o. at bedtime. 4. Protonix 40 mg p.o. daily. 5. Coumadin 2.5-5 mg p.o. daily depending on the protocol. 6. Zofran 4 mg IV q.6 h. IV p.r.n.  ALLERGIES:  No known drug allergies.  PHYSICAL EXAMINATION:  VITAL SIGNS:  Blood pressure is 127/75, heart rate is 60, respirations 20, temperature is 99.1. GENERAL:  The patient is in no acute distress, alert and oriented. HEENT:  Normocephalic, atraumatic.  Extraocular muscles intact. NECK:  Supple.  No lymphadenopathy. LUNGS:  Clear to auscultation bilaterally. CARDIOVASCULAR:  Regular rate and rhythm. ABDOMEN:  Flat, soft, mild epigastric tenderness.  No rebound or rigidity.  Positive bowel sounds. EXTREMITIES:  No clubbing, cyanosis, or edema.  Hemoccult of the stool was heme negative, and the patient had brown stool.  LABORATORY VALUES:  White blood cell count 3.0, hemoglobin 11.4, MCV is 80.0, and platelets at 174,000.  PT is 25.2, INR 2.27.  Sodium is 138, potassium 4.0, chloride 108, CO2 23, glucose  79, BUN 11, creatinine is 1.1.  Total bilirubin 0.6, alk phos 51, AST 28, ALT is 19.  Albumin is 2.0.  IMPRESSION: 1. Mild anemia. 2. Weight loss. 3. Intermittent nausea and vomiting. 4. Epigastric discomfort.  I am uncertain about the source of the patient's symptoms.  He did have a colonoscopy in 2005 which was essentially unrevealing.  There was some evidence of some types of ulcerations in the left side of the colon, which was nonspecific.  The patient has not had any further lower GI symptoms.  At this time, the patient appears to be stable, but with the epigastric pain and mild anemia and weight loss, an EGD is warranted. Plan is for EGD tomorrow and further recommendations pending the findings.     Jordan Hawks Elnoria Howard,  MD     PDH/MEDQ  D:  02/11/2010  T:  02/12/2010  Job:  [id number]  Electronically Signed by Jeani Hawking MD on 02/21/2010 07:18:07 AM

## 2010-02-25 LAB — CONVERTED CEMR LAB
ALT: 13 units/L (ref 0–53)
AST: 22 units/L (ref 0–37)
Albumin: 4.6 g/dL (ref 3.5–5.2)
Alkaline Phosphatase: 51 units/L (ref 39–117)
BUN: 15 mg/dL (ref 6–23)
Calcium: 9.3 mg/dL (ref 8.4–10.5)
Chloride: 99 meq/L (ref 96–112)
Creatinine, Ser: 1.11 mg/dL (ref 0.40–1.50)
INR: 1.9
Potassium: 3.5 meq/L (ref 3.5–5.3)
VLDL: 25 mg/dL (ref 0–40)

## 2010-02-26 ENCOUNTER — Telehealth: Payer: Self-pay | Admitting: *Deleted

## 2010-02-27 NOTE — Progress Notes (Signed)
Summary: Refill/gh  Phone Note Refill Request Message from:  Fax from Pharmacy on December 01, 2009 2:20 PM  Refills Requested: Medication #1:  COUMADIN 5 MG TAB Take as directed.   Last Refilled: 08/01/2009  Method Requested: Electronic Initial call taken by: Angelina Ok RN,  December 01, 2009 2:20 PM  Follow-up for Phone Call        Refill approved-nurse to complete    Prescriptions: COUMADIN 5 MG TAB (WARFARIN SODIUM) Take as directed.  #30 x 2   Entered and Authorized by:   Vassie Loll MD   Signed by:   Vassie Loll MD on 12/01/2009   Method used:   Telephoned to ...       [facility] (retail)       [address]       [place], [place]  [address]       Ph: [phone]       Fax: [phone]   RxID:   [id number]

## 2010-02-27 NOTE — Progress Notes (Signed)
  Phone Note From Other Clinic   Caller: Nurse Call For: Hulen Luster PharmD CACP Reason for Call: Schedule Patient Appt, Medication Check, Diagnosis Check Request: Talk with Provider Action Taken: Phone Call Completed, Provider Notified, Patient called Details for Reason: Office Nurse of Dr. Jeani Hawking calls citing patient is to undergo colonoscopy on 10-Feb-11 and the GI Medicine Physician wishes patient to discontinue warfarin 4-5 days prior to planned colonoscopy. Details of Complaint: No complaints of bleeding or thrombosis per nursing/physician assessment. Details of Action Taken: Warfarin to be discontinued 4-5 days prior to planned GI procedure. To recommence following day and see me in [facility] 1 week later. Summary of Call: Planned interruption of warfarin around GI Medicine Procedure. Initial call taken by: Hulen Luster PharmD CACP

## 2010-02-27 NOTE — Progress Notes (Signed)
Summary: med refill/gp  Phone Note Refill Request Message from:  Fax from Pharmacy on May 01, 2009 12:10 PM  Refills Requested: Medication #1:  REMERON 15 MG TABS Take 1 tab by mouth at bedtime every day..   Last Refilled: 03/30/2009  Method Requested: Telephone to Pharmacy Initial call taken by: Chinita Pester RN,  May 01, 2009 12:10 PM  Follow-up for Phone Call        Refill approved-nurse to complete    Prescriptions: REMERON 15 MG TABS (MIRTAZAPINE) Take 1 tab by mouth at bedtime every day.  #31 x 3   Entered and Authorized by:   Vassie Loll MD   Signed by:   Vassie Loll MD on 05/01/2009   Method used:   Telephoned to ...       [facility] (retail)       [address]       [place], [place]  [address]       Ph: [phone]       Fax: [phone]   RxID:   [id number]   Appended Document: med refill/gp Rx refill request faxed to [facility].
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
## 2010-02-27 NOTE — Consult Note (Signed)
Summary: [facility]  [facility]   Imported By: Florinda Marker 03/06/2009 11:13:46  _____________________________________________________________________  External Attachment:    Type:   Image     Comment:   External Document

## 2010-02-27 NOTE — Assessment & Plan Note (Signed)
Summary: COU/SB.  Anticoagulant Therapy Managed by: Barbera Setters. Janie Morning  PharmD CACP PCP: Vassie Loll MD [facility] Attending: Blanch Media MD Indication 1: Pulmonary  embolus Indication 2: Encounter for therapeutic drug monitoring  V58.83 Start date: 09/13/2006 Duration: Indefinite  Patient Assessment Reviewed by: Chancy Milroy PharmD  November 13, 2009 Medication review: verified warfarin dosage & schedule,verified previous prescription medications, verified doses & any changes, verified new medications, reviewed OTC medications, reviewed OTC health products-vitamins supplements etc Complications: none Dietary changes: none   Health status changes: none   Lifestyle changes: none   Recent/future hospitalizations: none   Recent/future procedures: none   Recent/future dental: none Patient Assessment Part 2:  Have you MISSED ANY DOSES or CHANGED TABLETS?  No missed Warfarin doses or changed tablets.  Have you had any BRUISING or BLEEDING ( nose or gum bleeds,blood in urine or stool)?  No reported bruising or bleeding in nose, gums, urine, stool.  Have you STARTED or STOPPED any MEDICATIONS, including OTC meds,herbals or supplements?  No other medications or herbal supplements were started or stopped.  Have you CHANGED your DIET, especially green vegetables,or ALCOHOL intake?  No changes in diet or alcohol intake.  Have you had any ILLNESSES or HOSPITALIZATIONS?  No reported illnesses or hospitalizations  Have you had any signs of CLOTTING?(chest discomfort,dizziness,shortness of breath,arms tingling,slurred speech,swelling or redness in leg)    No chest discomfort, dizziness, shortness of breath, tingling in arm, slurred speech, swelling, or redness in leg.     Treatment  Target INR: 2.0-3.0 INR: 2.3  Date: 11/13/2009 Regimen In:  30.0mg /week INR reflects regimen in: 2.3  New  Tablet strength: : 5mg  Regimen Out:     Sunday: 1 Tablet     Monday: 1/2 Tablet     Tuesday: 1  Tablet     Wednesday: 1 Tablet     Thursday: 1/2 Tablet      Friday: 1 Tablet     Saturday: 1 Tablet Total Weekly: 30.0mg /week mg  Next INR Due: 12/11/2009 Adjusted by: Barbera Setters. Alexandria Lodge III PharmD CACP   Return to anticoagulation clinic:  12/11/2009 Time of next visit: 1030    Allergies: No Known Drug Allergies

## 2010-02-27 NOTE — Assessment & Plan Note (Signed)
Summary: COU/VS  Anticoagulant Therapy Managed by: Barbera Setters. Janie Morning  PharmD CACP PCP: Vassie Loll MD [facility] Attending: Sampson Goon MD, Onalee Hua Indication 1: Pulmonary  embolus Indication 2: Encounter for therapeutic drug monitoring  V58.83 Start date: 09/13/2006 Duration: Indefinite  Patient Assessment Reviewed by: Chancy Milroy PharmD  April 24, 2009 Medication review: verified warfarin dosage & schedule,verified previous prescription medications, verified doses & any changes, verified new medications, reviewed OTC medications, reviewed OTC health products-vitamins supplements etc Complications: none Dietary changes: none   Health status changes: none   Lifestyle changes: none   Recent/future hospitalizations: none   Recent/future procedures: none   Recent/future dental: none Patient Assessment Part 2:  Have you MISSED ANY DOSES or CHANGED TABLETS?  No missed Warfarin doses or changed tablets.  Have you had any BRUISING or BLEEDING ( nose or gum bleeds,blood in urine or stool)?  No reported bruising or bleeding in nose, gums, urine, stool.  Have you STARTED or STOPPED any MEDICATIONS, including OTC meds,herbals or supplements?  No other medications or herbal supplements were started or stopped.  Have you CHANGED your DIET, especially green vegetables,or ALCOHOL intake?  No changes in diet or alcohol intake.  Have you had any ILLNESSES or HOSPITALIZATIONS?  No reported illnesses or hospitalizations  Have you had any signs of CLOTTING?(chest discomfort,dizziness,shortness of breath,arms tingling,slurred speech,swelling or redness in leg)    No chest discomfort, dizziness, shortness of breath, tingling in arm, slurred speech, swelling, or redness in leg.     Treatment  Target INR: 2.0-3.0 INR: 2.4  Date: 04/24/2009 Regimen In:  37.5mg /week INR reflects regimen in: 2.4  New  Tablet strength: : 5mg  Regimen Out:     Sunday: 1 Tablet     Monday: 1 Tablet     Tuesday: 1  Tablet     Wednesday: 1 & 1/2 Tablet     Thursday: 1 Tablet      Friday: 1 Tablet     Saturday: 1 Tablet Total Weekly: 37.5mg /week mg  Next INR Due: 05/22/2009 Adjusted by: Barbera Setters. Alexandria Lodge III PharmD CACP   Return to anticoagulation clinic:  05/22/2009 Time of next visit: 1045    Allergies: No Known Drug Allergies

## 2010-02-27 NOTE — Assessment & Plan Note (Signed)
Summary: COU/CH  Anticoagulant Therapy Managed by: Barbera Setters. Janie Morning  PharmD CACP PCP: Vassie Loll MD [facility] Attending: Margarito Liner MD Indication 1: Pulmonary  embolus Indication 2: Encounter for therapeutic drug monitoring  V58.83 Start date: 09/13/2006 Duration: Indefinite  Patient Assessment Reviewed by: Chancy Milroy PharmD  August 07, 2009 Medication review: verified warfarin dosage & schedule,verified previous prescription medications, verified doses & any changes, verified new medications, reviewed OTC medications, reviewed OTC health products-vitamins supplements etc Complications: none Dietary changes: none   Health status changes: none   Lifestyle changes: none   Recent/future hospitalizations: none   Recent/future procedures: none   Recent/future dental: none Patient Assessment Part 2:  Have you MISSED ANY DOSES or CHANGED TABLETS?  No missed Warfarin doses or changed tablets.  Have you had any BRUISING or BLEEDING ( nose or gum bleeds,blood in urine or stool)?  No reported bruising or bleeding in nose, gums, urine, stool.  Have you STARTED or STOPPED any MEDICATIONS, including OTC meds,herbals or supplements?  No other medications or herbal supplements were started or stopped.  Have you CHANGED your DIET, especially green vegetables,or ALCOHOL intake?  No changes in diet or alcohol intake.  Have you had any ILLNESSES or HOSPITALIZATIONS?  No reported illnesses or hospitalizations  Have you had any signs of CLOTTING?(chest discomfort,dizziness,shortness of breath,arms tingling,slurred speech,swelling or redness in leg)    No chest discomfort, dizziness, shortness of breath, tingling in arm, slurred speech, swelling, or redness in leg.     Treatment  Target INR: 2.0-3.0 INR: 3.5  Date: 08/07/2009 Regimen In:  32.5mg /week INR reflects regimen in: 3.5  New  Tablet strength: : 5mg  Regimen Out:     Sunday: 1 Tablet     Monday: 1/2 Tablet     Tuesday: 1 Tablet     Wednesday: 1 Tablet     Thursday: 1/2 Tablet      Friday: 1 Tablet     Saturday: 1 Tablet Total Weekly: 30mg /wk mg  Next INR Due: 08/28/2009 Adjusted by: Barbera Setters. Alexandria Lodge III PharmD CACP   Return to anticoagulation clinic:  08/28/2009 Time of next visit: 1015    Allergies: No Known Drug Allergies

## 2010-02-27 NOTE — Assessment & Plan Note (Signed)
Summary: pt coming 10:00am  Anticoagulant Therapy Managed by: Barbera Setters. Janie Morning  PharmD CACP PCP: Vassie Loll MD [facility] Attending: Darl Pikes, Beth Indication 1: Pulmonary  embolus Indication 2: Encounter for therapeutic drug monitoring  V58.83 Start date: 09/13/2006 Duration: Indefinite  Patient Assessment Reviewed by: Chancy Milroy PharmD  Jun 12, 2009 Medication review: verified warfarin dosage & schedule,verified previous prescription medications, verified doses & any changes, verified new medications, reviewed OTC medications, reviewed OTC health products-vitamins supplements etc Complications: none Dietary changes: none   Health status changes: none   Lifestyle changes: none   Recent/future hospitalizations: none   Recent/future procedures: none   Recent/future dental: none Patient Assessment Part 2:  Have you MISSED ANY DOSES or CHANGED TABLETS?  No missed Warfarin doses or changed tablets.  Have you had any BRUISING or BLEEDING ( nose or gum bleeds,blood in urine or stool)?  No reported bruising or bleeding in nose, gums, urine, stool.  Have you STARTED or STOPPED any MEDICATIONS, including OTC meds,herbals or supplements?  No other medications or herbal supplements were started or stopped.  Have you CHANGED your DIET, especially green vegetables,or ALCOHOL intake?  No changes in diet or alcohol intake.  Have you had any ILLNESSES or HOSPITALIZATIONS?  No reported illnesses or hospitalizations  Have you had any signs of CLOTTING?(chest discomfort,dizziness,shortness of breath,arms tingling,slurred speech,swelling or redness in leg)    No chest discomfort, dizziness, shortness of breath, tingling in arm, slurred speech, swelling, or redness in leg.     Treatment  Target INR: 2.0-3.0 INR: 2.8  Date: 06/12/2009 Regimen In:  37.5mg /week INR reflects regimen in: 2.8  New  Tablet strength: : 5mg  Regimen Out:     Sunday: 1 Tablet     Monday: 1 Tablet     Tuesday:  1 Tablet     Wednesday: 1 & 1/2 Tablet     Thursday: 1 Tablet      Friday: 1 Tablet     Saturday: 1 Tablet Total Weekly: 37.5mg /week mg  Next INR Due: 07/10/2009 Adjusted by: Barbera Setters. Alexandria Lodge III PharmD CACP   Return to anticoagulation clinic:  07/10/2009 Time of next visit: 1015    Allergies: No Known Drug Allergies

## 2010-02-27 NOTE — Procedures (Signed)
Summary: [facility] Colonoscopy  [facility] Colonoscopy   Imported By: Florinda Marker 03/23/2009 15:15:31  _____________________________________________________________________  External Attachment:    Type:   Image     Comment:   External Document

## 2010-02-27 NOTE — Assessment & Plan Note (Signed)
Summary: COU/CH  Anticoagulant Therapy Managed by: Barbera Setters. Janie Morning  PharmD CACP PCP: Vassie Loll MD [facility] Attending: Darl Pikes, Beth Indication 1: Pulmonary  embolus Indication 2: Encounter for therapeutic drug monitoring  V58.83 Start date: 09/13/2006 Duration: Indefinite  Patient Assessment Reviewed by: Chancy Milroy PharmD  February 13, 2009 Medication review: verified warfarin dosage & schedule,verified previous prescription medications, verified doses & any changes, verified new medications, reviewed OTC medications, reviewed OTC health products-vitamins supplements etc Complications: none Dietary changes: none   Health status changes: none   Lifestyle changes: none   Recent/future hospitalizations: none   Recent/future procedures: none   Recent/future dental: none Patient Assessment Part 2:  Have you MISSED ANY DOSES or CHANGED TABLETS?  No missed Warfarin doses or changed tablets.  Have you had any BRUISING or BLEEDING ( nose or gum bleeds,blood in urine or stool)?  No reported bruising or bleeding in nose, gums, urine, stool.  Have you STARTED or STOPPED any MEDICATIONS, including OTC meds,herbals or supplements?  No other medications or herbal supplements were started or stopped.  Have you CHANGED your DIET, especially green vegetables,or ALCOHOL intake?  No changes in diet or alcohol intake.  Have you had any ILLNESSES or HOSPITALIZATIONS?  No reported illnesses or hospitalizations  Have you had any signs of CLOTTING?(chest discomfort,dizziness,shortness of breath,arms tingling,slurred speech,swelling or redness in leg)    No chest discomfort, dizziness, shortness of breath, tingling in arm, slurred speech, swelling, or redness in leg.     Treatment  Target INR: 2.0-3.0 INR: 2.5  Date: 02/13/2009 Regimen In:  30.0mg /week INR reflects regimen in: 2.5  New  Tablet strength: : 5mg  Regimen Out:     Sunday: 1 Tablet     Monday: 1 Tablet     Tuesday: 1  Tablet     Wednesday: 1/2 Tablet     Thursday: 1 Tablet      Friday: 1 Tablet     Saturday: 1 Tablet Total Weekly: 32.5mg /week mg  Next INR Due: 03/13/2009 Adjusted by: Barbera Setters. Alexandria Lodge III PharmD CACP   Return to anticoagulation clinic:  03/13/2009 Time of next visit: 1015    Allergies: No Known Drug Allergies

## 2010-02-27 NOTE — Assessment & Plan Note (Signed)
Summary: COU/VS  Anticoagulant Therapy Managed by: Barbera Setters. [name]  PharmD CACP PCP: Vassie Loll MD [facility] Attending: Sampson Goon MD, Onalee Hua Indication 1: Pulmonary  embolus Indication 2: Encounter for therapeutic drug monitoring  V58.83 Start date: 09/13/2006 Duration: Indefinite  Patient Assessment Reviewed by: Chancy Milroy PharmD  April 03, 2009 Medication review: verified warfarin dosage & schedule,verified previous prescription medications, verified doses & any changes, verified new medications, reviewed OTC medications, reviewed OTC health products-vitamins supplements etc Complications: none Dietary changes: none   Health status changes: none   Lifestyle changes: none   Recent/future hospitalizations: none   Recent/future procedures: yes  Details: YES. Had colonoscopy performed 10-Feb-11 and had continued bleeding per rectum for a couple of days--he was instructed by his GI Medicine Physician to stay OFF warfarin--until 16-Feb-11 which he did. He has been back on warfarin for about 2 weeks now.  He has NOT had any more continued BRPPR or melanotic stools. Recent/future dental: none Patient Assessment Part 2:  Have you MISSED ANY DOSES or CHANGED TABLETS?  YES. Was off of warfarin prior to and post-colonoscopy per instructions from his GI Medicine Physician who performed a colonoscopy on 10-Feb-11.  Have you had any BRUISING or BLEEDING ( nose or gum bleeds,blood in urine or stool)?  No reported bruising or bleeding in nose, gums, urine, stool.  Have you STARTED or STOPPED any MEDICATIONS, including OTC meds,herbals or supplements?  No other medications or herbal supplements were started or stopped.  Have you CHANGED your DIET, especially green vegetables,or ALCOHOL intake?  No changes in diet or alcohol intake.  Have you had any ILLNESSES or HOSPITALIZATIONS?  No reported illnesses or hospitalizations  Have you had any signs of CLOTTING?(chest discomfort,dizziness,shortness  of breath,arms tingling,slurred speech,swelling or redness in leg)    No chest discomfort, dizziness, shortness of breath, tingling in arm, slurred speech, swelling, or redness in leg.     Treatment  Target INR: 2.0-3.0 INR: 1.9  Date: 04/03/2009 Regimen In:  32.5mg /week INR reflects regimen in: 1.9  New  Tablet strength: : 5mg  Regimen Out:     Sunday: 1 Tablet     Monday: 1 Tablet     Tuesday: 1 Tablet     Wednesday: 1 & 1/2 Tablet     Thursday: 1 Tablet      Friday: 1 Tablet     Saturday: 1 Tablet Total Weekly: 37.5mg /week mg  Next INR Due: 04/24/2009 Adjusted by: Barbera Setters. Alexandria Lodge III PharmD CACP   Return to anticoagulation clinic:  04/24/2009 Time of next visit: 1045    Allergies: No Known Drug Allergies

## 2010-02-27 NOTE — Assessment & Plan Note (Signed)
Summary: COU/CH  Anticoagulant Therapy Managed by: Barbera Setters. Janie Morning  PharmD CACP PCP: Vassie Loll MD [facility] Attending: Darl Pikes, Beth Indication 1: Pulmonary  embolus Indication 2: Encounter for therapeutic drug monitoring  V58.83 Start date: 09/13/2006 Duration: Indefinite  Patient Assessment Reviewed by: Chancy Milroy PharmD  July 10, 2009 Medication review: verified warfarin dosage & schedule,verified previous prescription medications, verified doses & any changes, verified new medications, reviewed OTC medications, reviewed OTC health products-vitamins supplements etc Complications: none Dietary changes: none   Health status changes: none   Lifestyle changes: none   Recent/future hospitalizations: none   Recent/future procedures: none   Recent/future dental: none Patient Assessment Part 2:  Have you MISSED ANY DOSES or CHANGED TABLETS?  No missed Warfarin doses or changed tablets.  Have you had any BRUISING or BLEEDING ( nose or gum bleeds,blood in urine or stool)?  No reported bruising or bleeding in nose, gums, urine, stool.  Have you STARTED or STOPPED any MEDICATIONS, including OTC meds,herbals or supplements?  No other medications or herbal supplements were started or stopped.  Have you CHANGED your DIET, especially green vegetables,or ALCOHOL intake?  No changes in diet or alcohol intake.  Have you had any ILLNESSES or HOSPITALIZATIONS?  No reported illnesses or hospitalizations  Have you had any signs of CLOTTING?(chest discomfort,dizziness,shortness of breath,arms tingling,slurred speech,swelling or redness in leg)    No chest discomfort, dizziness, shortness of breath, tingling in arm, slurred speech, swelling, or redness in leg.     Treatment  Target INR: 2.0-3.0 INR: 4.4  Date: 07/10/2009 Regimen In:  37.5mg /week INR reflects regimen in: 4.4  New  Tablet strength: : 5mg  Regimen Out:     Sunday: 1 Tablet     Monday: 1 Tablet     Tuesday: 1 Tablet     Wednesday: 1/2 Tablet     Thursday: 1 Tablet      Friday: 1 Tablet     Saturday: 1 Tablet Total Weekly: 32.5mg /week mg  Next INR Due: 08/07/2009 Adjusted by: Barbera Setters. Alexandria Lodge III PharmD CACP   Return to anticoagulation clinic:  08/07/2009 Time of next visit: 1015  Hold:  1 Days     Allergies: No Known Drug Allergies

## 2010-02-27 NOTE — Assessment & Plan Note (Signed)
Summary: COU/CH  Anticoagulant Therapy Managed by: Barbera Setters. [name]  PharmD CACP PCP: Vassie Loll MD [facility] Attending: Lowella Bandy MD Indication 1: Pulmonary  embolus Indication 2: Encounter for therapeutic drug monitoring  V58.83 Start date: 09/13/2006 Duration: Indefinite  Patient Assessment Reviewed by: Chancy Milroy PharmD  December 11, 2009 Medication review: verified warfarin dosage & schedule,verified previous prescription medications, verified doses & any changes, verified new medications, reviewed OTC medications, reviewed OTC health products-vitamins supplements etc Complications: none Dietary changes: none   Health status changes: none   Lifestyle changes: none   Recent/future hospitalizations: none   Recent/future procedures: none   Recent/future dental: none Patient Assessment Part 2:  Have you MISSED ANY DOSES or CHANGED TABLETS?  No missed Warfarin doses or changed tablets.  Have you had any BRUISING or BLEEDING ( nose or gum bleeds,blood in urine or stool)?  No reported bruising or bleeding in nose, gums, urine, stool.  Have you STARTED or STOPPED any MEDICATIONS, including OTC meds,herbals or supplements?  No other medications or herbal supplements were started or stopped.  Have you CHANGED your DIET, especially green vegetables,or ALCOHOL intake?  No changes in diet or alcohol intake.  Have you had any ILLNESSES or HOSPITALIZATIONS?  No reported illnesses or hospitalizations  Have you had any signs of CLOTTING?(chest discomfort,dizziness,shortness of breath,arms tingling,slurred speech,swelling or redness in leg)    No chest discomfort, dizziness, shortness of breath, tingling in arm, slurred speech, swelling, or redness in leg.     Treatment  Target INR: 2.0-3.0 INR: 2.7  Date: 12/11/2009 Regimen In:  30.0mg /week INR reflects regimen in: 2.7  New  Tablet strength: : 5mg  Regimen Out:     Sunday: 1 Tablet     Monday: 1/2 Tablet     Tuesday: 1  Tablet     Wednesday: 1 Tablet     Thursday: 1/2 Tablet      Friday: 1 Tablet     Saturday: 1 Tablet Total Weekly: 30.0mg/week mg  Next INR Due: 01/08/2010 Adjusted by: James B. Groce III PharmD CACP   Return to anticoagulation clinic:  01/08/2010 Time of next visit: 0945    Allergies: No Known Drug Allergies Prescriptions: COUMADIN 5 MG TAB (WARFARIN SODIUM) Take as directed.  #30 x 2   Entered by:   Jay Groce PharmD   Authorized by:   Stewart Rogers MD   Signed by:   Jay Groce PharmD on 12/11/2009   Method used:   Telephoned to ...       [facility] (retail)       [phone]       [place], [place]  [address]       Ph: [phone]       Fax: [phone]   RxID:   [id number]
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
## 2010-02-27 NOTE — Assessment & Plan Note (Signed)
Summary: COU/CH  Anticoagulant Therapy Managed by: Barbera Setters. Janie Morning  PharmD CACP PCP: Vassie Loll MD [facility] Attending: Coralee Pesa MD, Joen Laura  Indication 1: Pulmonary  embolus Indication 2: Encounter for therapeutic drug monitoring  V58.83 Start date: 09/13/2006 Duration: Indefinite  Patient Assessment Reviewed by: Chancy Milroy PharmD  August 28, 2009 Medication review: verified warfarin dosage & schedule,verified previous prescription medications, verified doses & any changes, verified new medications, reviewed OTC medications, reviewed OTC health products-vitamins supplements etc Complications: none Dietary changes: none   Health status changes: none   Lifestyle changes: none   Recent/future hospitalizations: none   Recent/future procedures: none   Recent/future dental: none Patient Assessment Part 2:  Have you MISSED ANY DOSES or CHANGED TABLETS?  No missed Warfarin doses or changed tablets.  Have you had any BRUISING or BLEEDING ( nose or gum bleeds,blood in urine or stool)?  No reported bruising or bleeding in nose, gums, urine, stool.  Have you STARTED or STOPPED any MEDICATIONS, including OTC meds,herbals or supplements?  No other medications or herbal supplements were started or stopped.  Have you CHANGED your DIET, especially green vegetables,or ALCOHOL intake?  No changes in diet or alcohol intake.  Have you had any ILLNESSES or HOSPITALIZATIONS?  No reported illnesses or hospitalizations  Have you had any signs of CLOTTING?(chest discomfort,dizziness,shortness of breath,arms tingling,slurred speech,swelling or redness in leg)    No chest discomfort, dizziness, shortness of breath, tingling in arm, slurred speech, swelling, or redness in leg.     Treatment  Target INR: 2.0-3.0 INR: 2.80  Date: 08/28/2009 Regimen In:  30mg /wk INR reflects regimen in: 2.80  New  Tablet strength: : 5mg  Regimen Out:     Sunday: 1 Tablet     Monday: 1 Tablet     Tuesday: 1  Tablet     Wednesday: 0 Tablet     Thursday: 1 Tablet      Friday: 1 Tablet     Saturday: 1 Tablet Total Weekly: 30.0mg /week mg  Next INR Due: 09/25/2009 Adjusted by: Barbera Setters. Alexandria Lodge III PharmD CACP   Return to anticoagulation clinic:  09/25/2009 Time of next visit: 1030    Allergies: No Known Drug Allergies

## 2010-03-01 ENCOUNTER — Ambulatory Visit: Admit: 2010-03-01 | Payer: Self-pay | Admitting: Cardiology

## 2010-03-01 ENCOUNTER — Encounter (INDEPENDENT_AMBULATORY_CARE_PROVIDER_SITE_OTHER): Payer: PRIVATE HEALTH INSURANCE | Admitting: Cardiology

## 2010-03-01 ENCOUNTER — Encounter: Payer: Self-pay | Admitting: Cardiology

## 2010-03-01 DIAGNOSIS — R55 Syncope and collapse: Secondary | ICD-10-CM
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
## 2010-03-01 NOTE — Progress Notes (Signed)
  Phone Note Call from Patient   Caller: Daughter Reason for Call: Acute Illness, Talk to Doctor Summary of Call: Patient passed out this AM. BP systolic was low in 80s. Pt had earlier ED workup which did not show anything. I advised them to bring him back to ED for further evaluation. Of note, it seems he is on Wararin for PE, but daughter did not know the indicaiton. I am not sure though, if he needs lifelong anticoagulation.  Initial call taken by: Clerance Lav MD,  February 10, 2010 8:12 AM
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
## 2010-03-01 NOTE — Assessment & Plan Note (Signed)
Summary: COU/CH  Anticoagulant Therapy Managed by: Barbera Setters. Janie Morning  PharmD CACP PCP: Vassie Loll MD [facility] Attending: Rogelia Boga MD, Lanora Manis Indication 1: Pulmonary  embolus Indication 2: Encounter for therapeutic drug monitoring  V58.83 Start date: 09/13/2006 Duration: Indefinite  Patient Assessment Reviewed by: Chancy Milroy PharmD  February 05, 2010 Medication review: verified warfarin dosage & schedule,verified previous prescription medications, verified doses & any changes, verified new medications, reviewed OTC medications, reviewed OTC health products-vitamins supplements etc Complications: none Dietary changes: none   Health status changes: none   Lifestyle changes: none   Recent/future hospitalizations: none   Recent/future procedures: none   Recent/future dental: none Patient Assessment Part 2:  Have you MISSED ANY DOSES or CHANGED TABLETS?  No missed Warfarin doses or changed tablets.  Have you had any BRUISING or BLEEDING ( nose or gum bleeds,blood in urine or stool)?  No reported bruising or bleeding in nose, gums, urine, stool.  Have you STARTED or STOPPED any MEDICATIONS, including OTC meds,herbals or supplements?  No other medications or herbal supplements were started or stopped.  Have you CHANGED your DIET, especially green vegetables,or ALCOHOL intake?  No changes in diet or alcohol intake.  Have you had any ILLNESSES or HOSPITALIZATIONS?  No reported illnesses or hospitalizations  Have you had any signs of CLOTTING?(chest discomfort,dizziness,shortness of breath,arms tingling,slurred speech,swelling or redness in leg)    No chest discomfort, dizziness, shortness of breath, tingling in arm, slurred speech, swelling, or redness in leg.     Treatment  Target INR: 2.0-3.0 INR: 2.9  Date: 02/05/2010 Regimen In:  30.0mg /week INR reflects regimen in: 2.9  New  Tablet strength: : 5mg  Regimen Out:     Sunday: 0 Tablet     Monday: 1 Tablet     Tuesday: 1  Tablet     Wednesday: 1/2 Tablet     Thursday: 1 Tablet      Friday: 1 Tablet     Saturday: 1 Tablet Total Weekly: 27.5mg /week mg  Next INR Due: 03/05/2010 Adjusted by: Barbera Setters. Alexandria Lodge III PharmD CACP   Return to anticoagulation clinic:  03/05/2010 Time of next visit: 1400    Allergies: No Known Drug Allergies

## 2010-03-01 NOTE — Discharge Summary (Signed)
Summary: Hospital Discharge Update    Hospital Discharge Update:  Date of Admission: 02/10/2010 Date of Discharge: 02/14/2010  Brief Summary:  This is a 70 year old male with a hx of hypertension, PE/DVT in 2008, and depression who presented with weight loss, orthostatic vital signs, and nausea/vomiting.  EGD was performed and demonstrated gastritis for which the pt was started on a PPI.  IVF were given which resolved the pts orthostasis.  Pt did have a 2D echo performed here which was normal and cardiology recomnded addition of florinef for the pts neurocardiogenic syncope.  Of note, pt's coumadin was discontinued as he has had no other thromotic events since 08. The case was discused with Dr. Alexandria Lodge.  Lab or other results pending at discharge:  FU pathology from EGD.  Labs needed at follow-up: CBC with differential  Other follow-up issues:  FU pts BP as floronef was added and all BP meds discontinued.  Pt has not had hypertension while hospitalized.  Pt is anemic but this is probably inpart secondary to IVF administration.  Ferritin was high during this stay.  Please continue to monitor.   Problem list changes:  Added new problem of SYNCOPE, HX OF (ICD-V12.49)  Medication list changes:  Removed medication of COUMADIN 5 MG TAB (WARFARIN SODIUM) Take as directed. Removed medication of LISINOPRIL-HYDROCHLOROTHIAZIDE 10-12.5 MG TABS (LISINOPRIL-HYDROCHLOROTHIAZIDE) Take 1 tablet by mouth once a day Removed medication of KLOR-CON M20 20 MEQ CR-TABS (POTASSIUM CHLORIDE CRYS CR) Take 1 tablet by mouth once a day Added new medication of FLUDROCORTISONE ACETATE 0.1 MG TABS (FLUDROCORTISONE ACETATE) Take 1 tablet by mouth once a day - Signed Added new medication of PRILOSEC 40 MG CPDR (OMEPRAZOLE) Take 1 tablet by mouth once a day - Signed Rx of FLUDROCORTISONE ACETATE 0.1 MG TABS (FLUDROCORTISONE ACETATE) Take 1 tablet by mouth once a day;  #31 x 1;  Signed;  Entered by: Sinda Du MD;   Authorized by: Sinda Du MD;  Method used: Print then Give to Patient Rx of PRILOSEC 40 MG CPDR (OMEPRAZOLE) Take 1 tablet by mouth once a day;  #31 x 1;  Signed;  Entered by: Sinda Du MD;  Authorized by: Sinda Du MD;  Method used: Print then Give to Patient  The medication, problem, and allergy lists have been updated.  Please see the dictated discharge summary for details.  Discharge medications:  BABY ASPIRIN 81 MG CHEW (ASPIRIN) Take 1 tablet by mouth once a day TYLENOL 8 HOUR 650 MG CR-TABS (ACETAMINOPHEN) Take 1 tablet by mouth four times a day, as needed. REMERON 15 MG TABS (MIRTAZAPINE) Take 1 tab by mouth at bedtime every day. CITALOPRAM HYDROBROMIDE 20 MG TABS (CITALOPRAM HYDROBROMIDE) Take 1 tablet by mouth once a day FLUDROCORTISONE ACETATE 0.1 MG TABS (FLUDROCORTISONE ACETATE) Take 1 tablet by mouth once a day PRILOSEC 40 MG CPDR (OMEPRAZOLE) Take 1 tablet by mouth once a day  Other patient instructions:  Please come for an appointment at the outpatient clinic at [facility] on 1/25 at 9:45 am with Dr. Scot Dock  for a followup visit.  Please follow up with your cardiologist Dr. Shirlee Latch on 2/2 9:30 at [address]- [facility]  Please be careful when you stand.  If you feel dizzy, sit down, and call the clinic at [phone].   Please take your medication as prescribed below.  If you have any problem, Please call the clinic.   In case of an emergency  dial 911 or go  to the emergency department.   Note: Hospital Discharge Medications & Other Instructions handout was printed, one copy for patient and a second copy to be placed in hospital chart.

## 2010-03-01 NOTE — Letter (Signed)
Summary: *Referral Letter  [facility]  [address]   [place], [place] [address]   Phone: [phone]  Fax: [phone]    02/05/2010  Thank you in advance for agreeing to see my patient:  Arless Vineyard [address] [place], [place]  [address]  Phone: [phone]  Reason for Referral:  Weight loss, ulcers noted on recent colonoscopy, early satiety  Procedures Requested: EGD and possible repeat colonoscopy/ eval for inflammatory bowel disease  Current Medical Problems: 1)  HEALTH MAINTENANCE EXAM (ICD-V70.0) 2)  DEPRESSION (ICD-311) 3)  WEIGHT LOSS (ICD-783.21) 4)  POPLITEAL CYST, RIGHT (ICD-727.51) 5)  BAKER'S CYST, RIGHT KNEE (ICD-727.51) 6)  HYPOKALEMIA (ICD-276.8) 7)  CHEST PAIN (ICD-786.50) 8)  PULMONARY EMBOLISM (ICD-415.19) 9)  AFTERCARE, LONG-TERM USE, MEDICATIONS NEC (ICD-V58.69) 10)  OSTEOARTHRITIS (ICD-715.90) 11)  DIVERTICULITIS, HX OF (ICD-V12.79) 12)  HYPERTENSION (ICD-401.9)   Current Medications: 1)  BABY ASPIRIN 81 MG CHEW (ASPIRIN) Take 1 tablet by mouth once a day 2)  COUMADIN 5 MG TAB (WARFARIN SODIUM) Take as directed. 3)  TYLENOL 8 HOUR 650 MG CR-TABS (ACETAMINOPHEN) Take 1 tablet by mouth four times a day, as needed. 4)  LISINOPRIL-HYDROCHLOROTHIAZIDE 20-12.5 MG TABS (LISINOPRIL-HYDROCHLOROTHIAZIDE) Take 1 tablet by mouth once a day 5)  KLOR-CON M20 20 MEQ CR-TABS (POTASSIUM CHLORIDE CRYS CR) Take 1 tablet by mouth once a day 6)  REMERON 15 MG TABS (MIRTAZAPINE) Take 1 tab by mouth at bedtime every day.   Past Medical History: 1)  PE/DVT in August 2008.  The patient continues to take Coumadin and 2)      this episode appears to have been unprovoked. 3)  Hypertension. 4)   BPH. 5)  Adenosine Myoview in 2004 showing EF of 52% with no evidence for 6)      ischemia.  There was basal inferior fixed defect that may have been 7)      artifact. 8)  History of hypercholesterolemia.  9)  Diverticulitis, hx of colonoscopy  12/2003, 02/2009        - colonic ulcers noted on both colonoscopies.        - path 2005:increased inflammatory cells in lamina propria c/w chronic colitis                             (ddx:UC,Crohns,NSAIDs and divericular disease) 10) . Osteoarthritis       -  Left knee primarily 11)  -  Acute R knee swelling 9/07 12) Proximal carpo-scapholunate disassociation, seen by Dr. Althea Charon    Prior History of Blood Transfusions:   Pertinent Labs: CBC, CMET, ASCO-ab,ESR, CRP pending   Thank you again for agreeing to see our patient; please contact us if you have any further questions or need additional information.  Sincerely,  Nelda Bucks DO

## 2010-03-01 NOTE — Miscellaneous (Signed)
Clinical Lists Changes  Problems: Assessed DEPRESSION as comment only - Dr. Dierdre Highman (ED physician) called [place] about the patient to get our opinion as the patient had been brought to the ED by his daughters who were concerned that the patient was very sick as he has lost  ~40 pounds in the past few years and seems overly fatigued.  Dr. Dierdre Highman felt that the patient was stable and able to go home as labwork and imaging were all wnl (including head CT, CXR, and bloodwork). Patient has not been seen often in the clinic, though by reviewing records it appears that he has had a depressed mood and gradual weight loss since the death of his wife in 16-Jun-2007.  His daughters agree with this.  We spoke with the daughters and the patient and they agreed to that he should start an antidepressant (citalopram) and have close follow-up in the clinic. Medications: Added new medication of CITALOPRAM HYDROBROMIDE 20 MG TABS (CITALOPRAM HYDROBROMIDE) Take 1 tablet by mouth once a day - Signed Rx of CITALOPRAM HYDROBROMIDE 20 MG TABS (CITALOPRAM HYDROBROMIDE) Take 1 tablet by mouth once a day;  #30 x 5;  Signed;  Entered by: Danelle Berry, MD;  Authorized by: Danelle Berry, MD;  Method used: Handwritten Observations: Added new observation of PROBLEMS REV: Done (02/09/2010 4:18) Added new observation of MEDRECON: current updated (02/09/2010 4:18) Added new observation of NKA: T (02/09/2010 4:18) Added new observation of ALLERGY REV: Done (02/09/2010 4:18)    Prescriptions: CITALOPRAM HYDROBROMIDE 20 MG TABS (CITALOPRAM HYDROBROMIDE) Take 1 tablet by mouth once a day  #30 x 5   Entered and Authorized by:   Danelle Berry, MD   Signed by:   Danelle Berry, MD on 02/09/2010   Method used:   Handwritten   RxID:   [id number]    Current Problems (verified): 1)  Health Maintenance Exam  (ICD-V70.0) 2)  Depression  (ICD-311) 3)  Weight Loss  (ICD-783.21) 4)  Popliteal Cyst, Right  (ICD-727.51) 5)  Baker's Cyst,  Right Knee  (ICD-727.51) 6)  Hypokalemia  (ICD-276.8) 7)  Chest Pain  (ICD-786.50) 8)  Pulmonary Embolism  (ICD-415.19) 9)  Aftercare, Long-term Use, Medications Nec  (ICD-V58.69) 10)  Osteoarthritis  (ICD-715.90) 11)  Diverticulitis, Hx of  (ICD-V12.79) 12)  Hypertension  (ICD-401.9)  Current Medications (verified): 1)  Baby Aspirin 81 Mg Chew (Aspirin) .... Take 1 Tablet By Mouth Once A Day 2)  Coumadin 5 Mg Tab (Warfarin Sodium) .... Take As Directed. 3)  Tylenol 8 Hour 650 Mg Cr-Tabs (Acetaminophen) .... Take 1 Tablet By Mouth Four Times A Day, As Needed. 4)  Lisinopril-Hydrochlorothiazide 10-12.5 Mg Tabs (Lisinopril-Hydrochlorothiazide) .... Take 1 Tablet By Mouth Once A Day 5)  Klor-Con M20 20 Meq Cr-Tabs (Potassium Chloride Crys Cr) .... Take 1 Tablet By Mouth Once A Day 6)  Remeron 15 Mg Tabs (Mirtazapine) .... Take 1 Tab By Mouth At Bedtime Every Day. 7)  Citalopram Hydrobromide 20 Mg Tabs (Citalopram Hydrobromide) .... Take 1 Tablet By Mouth Once A Day  Allergies (verified): No Known Drug Allergies   Impression & Recommendations:  Problem # 1:  DEPRESSION (ICD-311) Dr. Dierdre Highman (ED physician) called [place] about the patient to get our opinion as the patient had been brought to the ED by his daughters who were concerned that the patient was very sick as he has lost  ~40 pounds in the past few years and seems overly fatigued.  Dr. Dierdre Highman felt that the patient was stable and able to go  home as labwork and imaging were all wnl (including head CT, CXR, and bloodwork). Patient has not been seen often in the clinic, though by reviewing records it appears that he has had a depressed mood and gradual weight loss since the death of his wife in 07-01-2007.  His daughters agree with this.  We spoke with the daughters and the patient and they agreed to that he should start an antidepressant (citalopram) and have close follow-up in the clinic.  Complete Medication List: 1)  Baby Aspirin 81 Mg Chew  (Aspirin) .... Take 1 tablet by mouth once a day 2)  Coumadin 5 Mg Tab (Warfarin sodium) .... Take as directed. 3)  Tylenol 8 Hour 650 Mg Cr-tabs (Acetaminophen) .... Take 1 tablet by mouth four times a day, as needed. 4)  Lisinopril-hydrochlorothiazide 10-12.5 Mg Tabs (Lisinopril-hydrochlorothiazide) .... Take 1 tablet by mouth once a day 5)  Klor-con M20 20 Meq Cr-tabs (Potassium chloride crys cr) .... Take 1 tablet by mouth once a day 6)  Remeron 15 Mg Tabs (Mirtazapine) .... Take 1 tab by mouth at bedtime every day. 7)  Citalopram Hydrobromide 20 Mg Tabs (Citalopram hydrobromide) .... Take 1 tablet by mouth once a day

## 2010-03-01 NOTE — Assessment & Plan Note (Signed)
Summary: 945AM COU APPT/CH  Anticoagulant Therapy Managed by: Barbera Setters. Janie Morning  PharmD CACP PCP: Vassie Loll MD [facility] Attending: Margarito Liner MD Indication 1: Pulmonary  embolus Indication 2: Encounter for therapeutic drug monitoring  V58.83 Start date: 09/13/2006 Duration: Indefinite  Patient Assessment Reviewed by: Chancy Milroy PharmD  January 08, 2010 Medication review: verified warfarin dosage & schedule,verified previous prescription medications, verified doses & any changes, verified new medications, reviewed OTC medications, reviewed OTC health products-vitamins supplements etc Complications: none Dietary changes: none   Health status changes: none   Lifestyle changes: none   Recent/future hospitalizations: none   Recent/future procedures: none   Recent/future dental: none Patient Assessment Part 2:  Have you MISSED ANY DOSES or CHANGED TABLETS?  No missed Warfarin doses or changed tablets.  Have you had any BRUISING or BLEEDING ( nose or gum bleeds,blood in urine or stool)?  No reported bruising or bleeding in nose, gums, urine, stool.  Have you STARTED or STOPPED any MEDICATIONS, including OTC meds,herbals or supplements?  No other medications or herbal supplements were started or stopped.  Have you CHANGED your DIET, especially green vegetables,or ALCOHOL intake?  No changes in diet or alcohol intake.  Have you had any ILLNESSES or HOSPITALIZATIONS?  No reported illnesses or hospitalizations  Have you had any signs of CLOTTING?(chest discomfort,dizziness,shortness of breath,arms tingling,slurred speech,swelling or redness in leg)    No chest discomfort, dizziness, shortness of breath, tingling in arm, slurred speech, swelling, or redness in leg.     Treatment  Target INR: 2.0-3.0 INR: 2.7  Date: 01/08/2010 Regimen In:  30.0mg /week INR reflects regimen in: 2.7  New  Tablet strength: : 5mg  Regimen Out:     Sunday: 1 Tablet     Monday: 1/2 Tablet  Tuesday: 1 Tablet     Wednesday: 1 Tablet     Thursday: 1/2 Tablet      Friday: 1 Tablet     Saturday: 1 Tablet Total Weekly: 30.0mg /week mg  Next INR Due: 02/05/2010 Adjusted by: Barbera Setters. Alexandria Lodge III PharmD CACP   Return to anticoagulation clinic:  02/05/2010 Time of next visit: 0945    Allergies: No Known Drug Allergies

## 2010-03-01 NOTE — Progress Notes (Signed)
Summary: ED f/u/ hla  Phone Note Outgoing Call   Summary of Call: per dr Claudette Laws pt needs a f/u, daughter has appt tues at 1530 w/ dr mills, pt is scheduled to follow daughter's appt at 1600 Initial call taken by: Marin Roberts RN,  February 09, 2010 9:48 AM  Follow-up for Phone Call        Thanks! Follow-up by: Leodis Sias MD,  February 09, 2010 11:42 AM

## 2010-03-01 NOTE — Progress Notes (Signed)
Summary: refill/gg  Phone Note Refill Request  on January 31, 2010 3:35 PM  Refills Requested: Medication #1:  KLOR-CON M20 20 MEQ CR-TABS Take 1 tablet by mouth once a day   Last Refilled: 12/30/2009  Medication #2:  LISINOPRIL 10 MG TABS Take 1 tablet by mouth once a day   Last Refilled: 12/30/2009  Medication #3:  HYDROCHLOROTHIAZIDE 25 MG TABS Take one tablet by mouth once daily   Last Refilled: 12/30/2009 Last office visit  02/13/09 last labs   01/10/09 !!   Method Requested: Fax to Local Pharmacy Initial call taken by: Merrie Roof RN,  January 31, 2010 3:35 PM  Follow-up for Phone Call        Has Jan 9th appt. Will fill one month. Pt must keep appt to receive additional meds Follow-up by: Blanch Media MD,  February 01, 2010 10:45 AM    Prescriptions: KLOR-CON M20 20 MEQ CR-TABS (POTASSIUM CHLORIDE CRYS CR) Take 1 tablet by mouth once a day  #31 x 0   Entered and Authorized by:   Blanch Media MD   Signed by:   Blanch Media MD on 02/01/2010   Method used:   Faxed to ...       [facility] (retail)       [address]       [place], [place]  [address]       Ph: [phone]       Fax: [phone]   RxID:   [id number] LISINOPRIL 10 MG TABS (LISINOPRIL) Take 1 tablet by mouth once a day  #30 x 0   Entered and Authorized by:   Blanch Media MD   Signed by:   Blanch Media MD on 02/01/2010   Method used:   Faxed to ...       [facility] (retail)       [address]       [address]       [place], [place]  [address]       Ph: [phone]       Fax: [phone]   RxID:   [id number] HYDROCHLOROTHIAZIDE 25 MG TABS (HYDROCHLOROTHIAZIDE) Take one tablet by mouth once daily  #31 x 0   Entered and Authorized by:   Blanch Media MD   Signed by:   Blanch Media MD on 02/01/2010   Method used:   Faxed to ...       [facility] (retail)       [address]       [place], [place]  [address]       Ph: [phone]       Fax: [phone]   RxID:   [id number]

## 2010-03-01 NOTE — Assessment & Plan Note (Signed)
Summary: HFU-PER DR BOWEN/CFB(PRIBULA)   Vital Signs:  Patient profile:   70 year old male Height:      71 inches (180.34 cm) Weight:      177.5 pounds (80.68 kg) BMI:     24.85 Temp:     97.5 degrees F (36.39 degrees C) oral Pulse rate:   67 / minute BP sitting:   110 / 79  (left arm) BP standing:   120 / 80  Vitals Entered By: Stanton Kidney Ditzler RN (February 21, 2010 9:35 AM) Is Patient Diabetic? No Pain Assessment Patient in pain? no      Nutritional Status BMI of 19 -24 = normal Nutritional Status Detail appetite good  Have you ever been in a relationship where you felt threatened, hurt or afraid?denies   Does patient need assistance? Functional Status Self care Ambulation Normal Comments Daughter with pt. HFU - feeling better.   Primary Care Provider:  Leodis Sias MD   History of Present Illness: This is a 70 year old male with a history of PE/DVT in 2008, hypertension and depression comes for hfu   he was in the hospital last week for syncope which was possibly from orhtostatic hypotension. Was started on florinef.  No chest pain, syncope, dizzines after discharge. has been on florinef without apperent side effects He was also started on h-pylori tretment after discharge. he started taking them yesterday  Depression History:      The patient denies a depressed mood most of the day and a diminished interest in his usual daily activities.         Preventive Screening-Counseling & Management  Alcohol-Tobacco     Smoking Status: quit     Year Quit: long time ago  Caffeine-Diet-Exercise     Does Patient Exercise: no  Current Medications (verified): 1)  Baby Aspirin 81 Mg Chew (Aspirin) .... Take 1 Tablet By Mouth Once A Day 2)  Tylenol 8 Hour 650 Mg Cr-Tabs (Acetaminophen) .... Take 1 Tablet By Mouth Four Times A Day, As Needed. 3)  Remeron 15 Mg Tabs (Mirtazapine) .... Take 1 Tab By Mouth At Bedtime Every Day. 4)  Citalopram Hydrobromide 20 Mg Tabs  (Citalopram Hydrobromide) .... Take 1 Tablet By Mouth Once A Day 5)  Fludrocortisone Acetate 0.1 Mg Tabs (Fludrocortisone Acetate) .... Take 1 Tablet By Mouth Once A Day 6)  Prilosec 40 Mg Cpdr (Omeprazole) .... Take 1 Tablet By Mouth Once A Day 7)  Clarithromycin 500 Mg Tabs (Clarithromycin) .... Two Times A Day 8)  Omeprazole 40 Mg Cpdr (Omeprazole) .... Take 1 Tablet By Mouth Once A Day 9)  Amoxicillin 500 Mg Caps (Amoxicillin) .... 2 Tab Two Times A Day  Allergies (verified): No Known Drug Allergies  Review of Systems  The patient denies anorexia, fever, weight loss, weight gain, vision loss, decreased hearing, hoarseness, chest pain, syncope, dyspnea on exertion, peripheral edema, prolonged cough, headaches, hemoptysis, abdominal pain, melena, hematochezia, severe indigestion/heartburn, hematuria, incontinence, genital sores, muscle weakness, suspicious skin lesions, transient blindness, difficulty walking, depression, unusual weight change, abnormal bleeding, enlarged lymph nodes, angioedema, breast masses, and testicular masses.    Physical Exam  General:  Gen: VS reveiwed, Alert, well developed, nodistress ENT: mucous membranes pink & moist. No abnormal finds in ear and nose. CVC:S1 S2 , no murmurs, no abnormal heart sounds. Lungs: Clear to auscultation B/L. No wheezes, crackles or other abnormal sounds Abdomen: soft, non distended, no tender. Normal Bowel sounds EXT: no pitting edema, no engorged veins, Pulsations normal  Neuro:alert,  oriented *3, cranial nerved 2-12 intact, strenght normal in all  extremities, senstations normal to light touch.      Impression & Recommendations:  Problem # 1:  SYNCOPE, HX OF (ICD-V12.49) felling better since discharge, no furhter episodes of syncope, dizziness etc tolerating florinef orhtostatic vistals ok   plan - contine monitoring BP for now- advised to check it at home as well and lets [place] know if it runs <100 or >150 systolic -  contine fludricortisone - check elctrolytes - follow up with cardiology on 03/01/10 - consider clarifying the role of fludricortisone  in him. He does not have any documented mineralocorticoid deficiency and his labs results in the hospital do not point towards aldesterone deficiency.  He might need to be taken off florinef for few weeks and see how does, his aldosterone levels can be checked at this time to see if he would benifit from mineralocorticoid supplementation   Orders: T-CBC w/Diff ([id number]) T-Basic Metabolic Panel ([id number])  Problem # 2:  ANEMIA (ICD-285.9) had Hb of 10-11 in the hospital with ferritin of 600 will rechekc CBC and ferritin will continue to monitor, no symptoms from anemia  order T-Ferritin ([id number]) Augusto Gamble ([id number])  Problem # 3:  HELICOBACTER PYLORI GASTRITIS (ICD-041.86) on treatment with ppi and antibiotics continue to monitor  Complete Medication List: 1)  Baby Aspirin 81 Mg Chew (Aspirin) .... Take 1 tablet by mouth once a day 2)  Tylenol 8 Hour 650 Mg Cr-tabs (Acetaminophen) .... Take 1 tablet by mouth four times a day, as needed. 3)  Remeron 15 Mg Tabs (Mirtazapine) .... Take 1 tab by mouth at bedtime every day. 4)  Citalopram Hydrobromide 20 Mg Tabs (Citalopram hydrobromide) .... Take 1 tablet by mouth once a day 5)  Fludrocortisone Acetate 0.1 Mg Tabs (Fludrocortisone acetate) .... Take 1 tablet by mouth once a day 6)  Prilosec 40 Mg Cpdr (Omeprazole) .... Take 1 tablet by mouth once a day 7)  Clarithromycin 500 Mg Tabs (Clarithromycin) .... Two times a day 8)  Omeprazole 40 Mg Cpdr (Omeprazole) .... Take 1 tablet by mouth once a day 9)  Amoxicillin 500 Mg Caps (Amoxicillin) .... 2 tab two times a day  Patient Instructions: 1)  Please schedule a follow-up appointment in 1 month.   Orders Added: 1)  Est. Patient Level IV [99214] 2)  T-CBC w/Diff [[id number]] 3)  T-Basic Metabolic Panel [[id number]] 4)  T-Ferritin  [[id number]] 5)  Augusto Gamble [[id number]]   Process Orders Check Orders Results:     [facility]: Order checked:     23350 -- T-Ferritin -- ABN required due to diagnosis (CPT: 03474)     23310 -- T-Iron -- ABN required due to diagnosis (CPT: 83540) Tests Sent for requisitioning (February 21, 2010 1:55 PM):     02/21/2010: [facility] -- T-CBC w/Diff [[id number]] (signed)     02/21/2010: [facility] -- T-Basic Metabolic Panel [phone] (signed)     02/21/2010: [facility] -- T-Ferritin [[id number]] (signed)     02/21/2010: [facility] -- Augusto Gamble [[id number]] (signed)     Prevention & Chronic Care Immunizations   Influenza vaccine: Fluvax 3+  (02/05/2010)   Influenza vaccine due: 09/28/2009    Tetanus booster: 01/10/2009: Tdap    Pneumococcal vaccine: Pneumovax (Medicare)  (02/05/2010)   Pneumococcal vaccine deferral: Deferred  (01/10/2009)    H. zoster vaccine: Not documented   H. zoster vaccine deferral: Deferred  (01/10/2009)  Colorectal Screening   Hemoccult: Negative  (07/17/2001)  Colonoscopy: Abnormal  (01/13/2004)   Colonoscopy action/deferral: GI referral  (01/10/2009)  Other Screening   PSA: 2.07  (01/10/2009)   PSA action/deferral: Discussed-PSA requested  (01/10/2009)   Smoking status: quit  (02/21/2010)  Lipids   Total Cholesterol: 153  (01/10/2009)   Lipid panel action/deferral: Lipid Panel ordered   LDL: 106  (01/10/2009)   LDL Direct: Not documented   HDL: 34  (01/10/2009)   Triglycerides: 64  (01/10/2009)  Hypertension   Last Blood Pressure: 110 / 79  (02/21/2010)   Serum creatinine: 1.22  (02/05/2010)   BMP action: Ordered   Serum potassium 3.8  (02/05/2010)  Self-Management Support :   Personal Goals (by the next clinic visit) :      Personal blood pressure goal: 140/90  (01/10/2009)   Patient will work on the following items until the next clinic visit to  reach self-care goals:     Medications and monitoring: take my medicines every day, check my blood pressure, bring all of my medications to every visit  (02/21/2010)     Eating: eat more vegetables, use fresh or frozen vegetables, eat foods that are low in salt, eat fruit for snacks and desserts, limit or avoid alcohol  (02/21/2010)     Activity: take a 30 minute walk every day  (02/21/2010)    Hypertension self-management support: Written self-care plan, Education handout, Resources for patients handout  (02/21/2010)   Hypertension self-care plan printed.   Hypertension education handout printed      Resource handout printed.  Process Orders Check Orders Results:     [facility]: Order checked:     23350 -- T-Ferritin -- ABN required due to diagnosis (CPT: 81191)     23310 -- T-Iron -- ABN required due to diagnosis (CPT: 219 344 4430) Tests Sent for requisitioning (February 21, 2010 1:55 PM):     02/21/2010: [facility] -- T-CBC w/Diff [[id number]] (signed)     02/21/2010: [facility] -- T-Basic Metabolic Panel [phone] (signed)     02/21/2010: [facility] -- T-Ferritin [phone] (signed)     02/21/2010: [facility] -- Augusto Gamble [[id number]] (signed)

## 2010-03-05 ENCOUNTER — Ambulatory Visit: Payer: Self-pay | Admitting: Pharmacist

## 2010-03-07 NOTE — Progress Notes (Signed)
Summary: call  ---- Converted from flag ---- ---- 02/09/2010 4:17 AM, Marin Roberts RN wrote: tried to call daughter, got vmail, left message to rtc asap for appt  h.  ---- 02/09/2010 4:17 AM, Danelle Berry, MD wrote: Mr. Mazo was seen by [place] in the ED and discharged home but he needs close follow-up in the [facility] clinic.  Any availablility early next week?  Please call the patient and his daughter Arville Lime at [phone] with the appointment details.  Thank you very much,  Shanda Bumps ------------------------------
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
## 2010-03-08 ENCOUNTER — Ambulatory Visit: Payer: Self-pay | Admitting: Internal Medicine

## 2010-03-15 ENCOUNTER — Other Ambulatory Visit (INDEPENDENT_AMBULATORY_CARE_PROVIDER_SITE_OTHER): Payer: PRIVATE HEALTH INSURANCE

## 2010-03-15 ENCOUNTER — Other Ambulatory Visit: Payer: Self-pay

## 2010-03-15 ENCOUNTER — Encounter (INDEPENDENT_AMBULATORY_CARE_PROVIDER_SITE_OTHER): Payer: Self-pay | Admitting: *Deleted

## 2010-03-15 DIAGNOSIS — R55 Syncope and collapse: Secondary | ICD-10-CM

## 2010-03-15 LAB — BASIC METABOLIC PANEL
BUN: 8 mg/dL (ref 6–23)
CO2: 28 mEq/L (ref 19–32)
Chloride: 106 mEq/L (ref 96–112)
GFR: 115.02 mL/min (ref >60.00)
Glucose, Bld: 99 mg/dL (ref 70–99)
Potassium: 3.3 mEq/L — ABNORMAL LOW (ref 3.5–5.1)
Sodium: 140 mEq/L (ref 135–145)

## 2010-03-22 ENCOUNTER — Ambulatory Visit: Payer: Self-pay | Admitting: Ophthalmology

## 2010-03-23 ENCOUNTER — Encounter: Payer: Self-pay | Admitting: Ophthalmology

## 2010-04-13 ENCOUNTER — Other Ambulatory Visit: Payer: Self-pay | Admitting: Internal Medicine

## 2010-04-13 ENCOUNTER — Ambulatory Visit (INDEPENDENT_AMBULATORY_CARE_PROVIDER_SITE_OTHER): Payer: PRIVATE HEALTH INSURANCE | Admitting: Internal Medicine

## 2010-04-13 DIAGNOSIS — I2699 Other pulmonary embolism without acute cor pulmonale: Secondary | ICD-10-CM

## 2010-04-13 DIAGNOSIS — I10 Essential (primary) hypertension: Secondary | ICD-10-CM

## 2010-04-13 DIAGNOSIS — Z8669 Personal history of other diseases of the nervous system and sense organs: Secondary | ICD-10-CM

## 2010-04-13 DIAGNOSIS — F329 Major depressive disorder, single episode, unspecified: Secondary | ICD-10-CM

## 2010-04-13 MED ORDER — FLUDROCORTISONE ACETATE 0.1 MG PO TABS: 0.0500 mg | ORAL_TABLET | Freq: Every day | ORAL | Status: DC

## 2010-04-13 NOTE — Patient Instructions (Addendum)
Change your Fludrocortisone to 1/2 tablet daily (0.05 mg). Check your blood pressure several times per day and if you can keep a record of that bring it with you to your next appointment.   If you start noticing dizziness when your standing or problems with passing out again call us right away.  [phone].    Please come back next week in the morning before you have breakfast to have a cholesterol check drawn.  If there is anything we need to discuss when I get the results I will call you.  Continue all your other medications as prescribed.  Follow up with me on 05/25/10.

## 2010-04-13 NOTE — Progress Notes (Signed)
  Subjective:    Patient ID: ALESSANDER SIKORSKI, male    DOB: [birth year], 70 y.o.   MRN: [id number]  HPI  Patient is a 70 year old man who presents today for follow up on his chronic medical conditions and to get established with me as a primary care provider.  He was recently hospitalized for syncope thought to be related to neurocardiogenic syncope.  He was started on Florinef and since then has been doing well.  He states he has been taking his medications without side effects.  He is able to get up and move more and has gained some weight which he attributes to a much improved appetite.  He states even his chronic pain he had before feels much better.  He also states his mood is much better recently and he is getting good sleep at night.  Review of Systems    Constitutional: Denies fever, chills, diaphoresis, appetite change and fatigue.  HEENT: Denies photophobia, eye pain, redness, hearing loss, ear pain, congestion, sore throat, rhinorrhea, sneezing, mouth sores, trouble swallowing, neck pain, neck stiffness and tinnitus.   Respiratory: Denies SOB, DOE, cough, chest tightness,  and wheezing.   Cardiovascular: Denies chest pain, palpitations and leg swelling.  Gastrointestinal: Denies nausea, vomiting, abdominal pain, diarrhea, constipation, blood in stool and abdominal distention.  Genitourinary: Denies dysuria, urgency, frequency, hematuria, flank pain and difficulty urinating.  Musculoskeletal: Denies myalgias, back pain, joint swelling, arthralgias and gait problem.  Skin: Denies pallor, rash and wound.  Neurological: Denies dizziness, seizures, syncope, weakness, light-headedness, numbness and headaches.  Hematological: Denies adenopathy. Easy bruising, personal or family bleeding history  Psychiatric/Behavioral: Denies suicidal ideation, mood changes, confusion, nervousness, sleep disturbance and agitation  Objective:   Physical Exam   Constitutional: Vital signs reviewed.  Patient is  a well-developed and well-nourished man in no acute distress and cooperative with exam. Alert and oriented x3.  Head: Normocephalic and atraumatic Ear: TM normal bilaterally Mouth: no erythema or exudates, MMM Eyes: PERRL, EOMI, conjunctivae normal, No scleral icterus.  Neck: Supple, Trachea midline normal ROM, No JVD, mass, thyromegaly, or carotid bruit present.  Cardiovascular: RRR, S1 normal, S2 normal, no MRG, pulses symmetric and intact bilaterally Pulmonary/Chest: CTAB, no wheezes, rales, or rhonchi Abdominal: Soft. Non-tender, non-distended, bowel sounds are normal, no masses, organomegaly, or guarding present.  GU: no CVA tenderness Musculoskeletal: No joint deformities, erythema, or stiffness, ROM full and no nontender Hematology: no cervical, inginal, or axillary adenopathy.  Neurological: A&O x3, Strenght is normal and symmetric bilaterally, cranial nerve II-XII are grossly intact, no focal motor deficit, sensory intact to light touch bilaterally.  Skin: Warm, dry and intact. No rash, cyanosis, or clubbing.  Psychiatric: Normal mood and affect. speech and behavior is normal. Judgment and thought content normal. Cognition and memory are normal.   Assessment & Plan:

## 2010-04-14 ENCOUNTER — Other Ambulatory Visit: Payer: Self-pay | Admitting: Ophthalmology

## 2010-04-16 ENCOUNTER — Other Ambulatory Visit: Payer: Self-pay | Admitting: *Deleted

## 2010-04-16 ENCOUNTER — Encounter: Payer: Self-pay | Admitting: Internal Medicine

## 2010-04-16 MED ORDER — OMEPRAZOLE 40 MG PO CPDR: 40.0000 mg | DELAYED_RELEASE_CAPSULE | Freq: Every day | ORAL | Status: DC

## 2010-04-16 NOTE — Assessment & Plan Note (Addendum)
Patient has had no recurrence of these episodes since starting Florinef.  He has followed up with Dr. Shirlee Latch and he wants to keep him on Florinef and not switch him to an acetylcholinesterase inhibitor because of the risk of severe bradycardia.  The patient seems to be tolerating the medication well but is having increased HTN.  We will continue the Florinef for this but at 0.05 mg daily to try to control the hypertension.  If he starts to have recurrences of his syncope we will likely have to go back to the 0.1 mg daily and adjust other BP meds to try to control his blood pressure.

## 2010-04-16 NOTE — Assessment & Plan Note (Signed)
His blood pressure is increased from his baseline likely due to the sodium retention and preload increase from the Florinef.  He has not had a recurrence of the syncope but his BP is well above his goal.  The plan is to decrease the dose of the Florinef to 0.05 mg daily and have him follow up to see where we go from here.  At his follow up he should have orthostatic vital signs taken and discuss if he has had a return of his syncope.

## 2010-04-16 NOTE — Assessment & Plan Note (Signed)
Mood is excellent today.  He states he is sleeping well and able to be active and involved.  His Celexa and Remeron doses are have been stable.  Will continue these medications for now and see if in the future he is able to be weaned off one or the other or both.

## 2010-04-16 NOTE — Telephone Encounter (Signed)
Patient on citalopram 20mg , started on prilosec while inpatient.  Interaction b/w the meds noted and max dose citalopram is 20mg  while on prilosec.  EKGs previously with normal QTc.  Will provide 90 day supply only and consider stopping as EGD while inpatient only showed some gastritis.

## 2010-04-16 NOTE — Assessment & Plan Note (Addendum)
He was taken off his coumadin because of his increase fall risk from the syncope.  With the treatment of the syncope we will need to have a discussion if he would need to return to coumadin.  Since he has not had a thromboembolic event since 2008 it is unlikely he will need to return to coumadin therapy.

## 2010-04-23 ENCOUNTER — Encounter: Payer: Self-pay | Admitting: *Deleted

## 2010-05-07 ENCOUNTER — Ambulatory Visit: Payer: PRIVATE HEALTH INSURANCE | Admitting: Cardiology

## 2010-05-21 ENCOUNTER — Other Ambulatory Visit: Payer: Self-pay | Admitting: *Deleted

## 2010-05-21 MED ORDER — MIRTAZAPINE 15 MG PO TABS: 15.0000 mg | ORAL_TABLET | Freq: Every day | ORAL | Status: DC

## 2010-05-25 ENCOUNTER — Encounter: Payer: Self-pay | Admitting: Internal Medicine

## 2010-05-25 ENCOUNTER — Ambulatory Visit (INDEPENDENT_AMBULATORY_CARE_PROVIDER_SITE_OTHER): Payer: PRIVATE HEALTH INSURANCE | Admitting: Cardiology

## 2010-05-25 ENCOUNTER — Encounter: Payer: Self-pay | Admitting: Cardiology

## 2010-05-25 ENCOUNTER — Ambulatory Visit (INDEPENDENT_AMBULATORY_CARE_PROVIDER_SITE_OTHER): Payer: PRIVATE HEALTH INSURANCE | Admitting: Internal Medicine

## 2010-05-25 VITALS — BP 162/90 | HR 59 | Ht 71.0 in | Wt 202.0 lb

## 2010-05-25 DIAGNOSIS — F329 Major depressive disorder, single episode, unspecified: Secondary | ICD-10-CM

## 2010-05-25 DIAGNOSIS — F3289 Other specified depressive episodes: Secondary | ICD-10-CM

## 2010-05-25 DIAGNOSIS — Z8669 Personal history of other diseases of the nervous system and sense organs: Secondary | ICD-10-CM

## 2010-05-25 DIAGNOSIS — R55 Syncope and collapse: Secondary | ICD-10-CM

## 2010-05-25 DIAGNOSIS — I2699 Other pulmonary embolism without acute cor pulmonale: Secondary | ICD-10-CM

## 2010-05-25 DIAGNOSIS — I10 Essential (primary) hypertension: Secondary | ICD-10-CM

## 2010-05-25 DIAGNOSIS — G5690 Unspecified mononeuropathy of unspecified upper limb: Secondary | ICD-10-CM

## 2010-05-25 MED ORDER — LISINOPRIL 20 MG PO TABS: 20.0000 mg | ORAL_TABLET | Freq: Every day | ORAL | Status: DC

## 2010-05-25 NOTE — Patient Instructions (Signed)
Continue taking all your medications as prescribed.  Come back in 2 weeks for a blood pressure check and a laboratory test.  If you have any problems please don't hesitate to call us at [phone].

## 2010-05-25 NOTE — Progress Notes (Signed)
  Subjective:    Patient ID: [name], male    DOB: 24-Sep-1940, 70 y.o.   MRN: [id number]  HPI  Patient is a 70 year old man with a PMH of HTN, Depression, and syncope who presents to clinic for follow up from his last visit.  He was hospitalized recently for his syncope and was started on Florinef.  At his follow up appointment his blood pressure was greatly elevated.  We decreased his Florinef to 1/2 tablet daily and added back his lisinopril.  He is here for a recheck today.  Since his last visit he has not had any recurrence of his syncope.  He has been taking his medications and doing quite well.  He actually saw Dr. Shirlee Latch today and he increased his lisinopril from 10 to 20 mg.   His mood has been good recently and he has been sleeping well.    He has had some intermittent numbness and tingling in his right arm for the last 2-3 weeks.  He states it is usually at night when he is sleeping on it or if he is resting his arm on a table.  If he lets his arm hang or changes position it resolves.  He denies weakness or pain.    Review of Systems    Constitutional: Denies fever, chills, diaphoresis, appetite change and fatigue.  HEENT: Denies photophobia, eye pain, redness, hearing loss, ear pain, congestion, sore throat, rhinorrhea, sneezing, mouth sores, trouble swallowing, neck pain, neck stiffness and tinnitus.   Respiratory: Denies SOB, DOE, cough, chest tightness,  and wheezing.   Cardiovascular: Denies chest pain, palpitations and leg swelling.  Gastrointestinal: Denies nausea, vomiting, abdominal pain, diarrhea, constipation, blood in stool and abdominal distention.  Genitourinary: Denies dysuria, urgency, frequency, hematuria, flank pain and difficulty urinating.  Musculoskeletal: Denies myalgias, back pain, joint swelling, arthralgias and gait problem.  Skin: Denies pallor, rash and wound.  Neurological: Positive for numbness and tingling in right arm and hand. Denies dizziness,  seizures, syncope, weakness, light-headedness,  and headaches.  Hematological: Denies adenopathy. Easy bruising, personal or family bleeding history  Psychiatric/Behavioral: Denies suicidal ideation, mood changes, confusion, nervousness, sleep disturbance and agitation  Objective:   Physical Exam    Constitutional: Vital signs reviewed.  Patient is a well-developed and well-nourished man in no acute distress and cooperative with exam. Alert and oriented x3.  Head: Normocephalic and atraumatic Ear: TM normal bilaterally Mouth: no erythema or exudates, MMM Eyes: PERRL, EOMI, conjunctivae normal, No scleral icterus.  Neck: Supple, Trachea midline normal ROM, No JVD, mass, thyromegaly, or carotid bruit present.  Cardiovascular: RRR, S1 normal, S2 normal, no MRG, pulses symmetric and intact bilaterally Pulmonary/Chest: CTAB, no wheezes, rales, or rhonchi Abdominal: Soft. Non-tender, non-distended, bowel sounds are normal, no masses, organomegaly, or guarding present.  GU: no CVA tenderness Musculoskeletal: No joint deformities, erythema, or stiffness, ROM full and no nontender Hematology: no cervical, inginal, or axillary adenopathy.  Neurological: A&O x3, Strength is normal and symmetric bilaterally, cranial nerve II-XII are grossly intact, no focal motor deficit, sensory intact to light touch bilaterally.  negative Tinnel sign in ulnar groove and over median nerve,  Median nerve compression test negative. Skin: Warm, dry and intact. No rash, cyanosis, or clubbing.  Psychiatric: Normal mood and affect. speech and behavior is normal. Judgment and thought content normal. Cognition and memory are normal.    Assessment & Plan:

## 2010-05-25 NOTE — Patient Instructions (Signed)
Increase Lisinopril to 20mg  daily.  Lab in 2 weeks--BMP  780.2  401.9--you have the order.   Your physician wants you to follow-up in: 6 months with Dr Shirlee Latch. (October 2012). You will receive a reminder letter in the mail two months in advance. If you don't receive a letter, please call our office to schedule the follow-up appointment.

## 2010-05-27 DIAGNOSIS — G5690 Unspecified mononeuropathy of unspecified upper limb: Secondary | ICD-10-CM | POA: Insufficient documentation
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
## 2010-05-27 NOTE — Assessment & Plan Note (Signed)
Patient's symptoms appear to have been neurocardiogenic in nature (vasovagal).  HR dropped and BP dropped with standing while in the hospital.  Since starting Florinef, symptoms have completely resolved. The problem now is that his BP is running high.  If he had a problem with orthostatic BP only, we could treat him with pyridostigmine (which would not raise BP).  However, he also had bradycardic episodes so I do not want to use this medication (could lower HR further).  I will continue current dose of Florinef and increase his lisinopril to 20 mg daily.  He will need BMET in 2 wks.

## 2010-05-27 NOTE — Assessment & Plan Note (Signed)
No recurrence of the syncope with the decreased dose of Florinef.  Will continue to monitor as we are working to control his blood pressure.  He will need orthostatic blood pressures at his follow up!

## 2010-05-27 NOTE — Assessment & Plan Note (Signed)
Mood has been excellent.  He has been sleeping well on the Remeron.  We will continue it for now and consider a possible taper off the medication in 6-12 months.

## 2010-05-27 NOTE — Assessment & Plan Note (Signed)
At today's visit he has no discernable sign of neuropathy and the pattern doesn't fully match either ulnar or median neuropathy.  He denies any neck pain or trauma to his shoulder or neck that would indicate a disk problem in his cervical spine.  If he continues to have problems and they get worse we could start with a MRI of his neck and possible EMG to try to diagnose the problem.  In the meantime he was encouraged to not to sleep on his shoulder and to try to find a padded elbow splint to help alleviate the symptoms.

## 2010-05-27 NOTE — Assessment & Plan Note (Signed)
Blood pressure today is better then previously.  Likely still increased from the Florinef dose.  His goal with his other comorbid conditions would be 140/90 but he was having syncope with orthostasis.  We will continue to titrate his lisinopril and have him come back in 2 weeks for a recheck on the new dose.  If he continues to be hypertensive the plan would be to decrease the Florinef to 0.05 mg every other day and follow from there.    BP Readings from Last 3 Encounters:  05/25/10 159/78  05/25/10 162/90  04/13/10 179/94
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
## 2010-06-08 ENCOUNTER — Other Ambulatory Visit: Payer: PRIVATE HEALTH INSURANCE

## 2010-06-08 DIAGNOSIS — Z8669 Personal history of other diseases of the nervous system and sense organs: Secondary | ICD-10-CM

## 2010-06-09 LAB — CORTISOL: Cortisol, Plasma: 1.4 ug/dL
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
## 2010-06-12 NOTE — Discharge Summary (Signed)
[name], [name]               ACCOUNT NO.:  [id number]   MEDICAL RECORD NO.:  [id number]          PATIENT TYPE:  INP   LOCATION:  5708                         FACILITY:  [facility]   PHYSICIAN:  Eliseo Gum, M.D.   DATE OF BIRTH:  03-26-1940   DATE OF ADMISSION:  09/13/2006  DATE OF DISCHARGE:  09/16/2006                               DISCHARGE SUMMARY   DISCHARGE DIAGNOSES:  1. Bilateral pulmonary embolism.  2. Left calf deep venous thrombosis.  3. Hypertension.  4. Hyperlipidemia.   DISCHARGE MEDICATIONS:  1. Hydrochlorothiazide 25 mg p.o. daily.  2. Aspirin 325 mg p.o. daily.  3. Lovenox 95 mg p.o. twice daily.  Stop on September 18, 2006.  4. Coumadin 7.5 mg p.o. daily.  5. Vicodin 5/500 mg p.o. q.6h. p.r.n. pain.   DISPOSITION/FOLLOWUP:  1. Follow up at the [facility]      with Dr. Yetta Barre on October 07, 2006, at 9:15 a.m.      Continue to follow the patient's INR and any more symptoms      associated with a pulmonary embolism.  2. Follow up with [facility] with      Dr. Alexandria Lodge on Monday, September 22, 2006, at 3:30 p.m., to make more      adjustments made with Coumadin.  3. Follow up with [facility] on      Thursday, September 18, 2006, at 10 a.m., to have PT and INR values      checked by labs.  4. The patient also has a home health physical therapist set up to      help ambulation, and was sent home on a single-point cane.   PROCEDURES PERFORMED:  1. On September 13, 2006, an electrocardiogram showed beta cardia.  2. On September 13, 2006, showed no abnormalities.  3. On September 13, 2006, a CT with contrast showed filling defects in      the pulmonary arteries to the lower lobes bilaterally, right      greater than left, and is consistent with pulmonary embolism.   LABORATORY DATA:  On September 13, 2006, on September 14, 2006, and on September 15, 2006, CBCs, BMETs and  CMETs.  On September 13, 2006, a urinalysis  showed no abnormalities.  On September 13, 2006, urine culture showed no  organisms present.  On September 15, 2006, showed a PSA which was normal.   CONSULTATIONS:  None.   HISTORY OF PRESENT ILLNESS:  [name] is a 70 year old  African/American male with a significant past medical history for  hypertension, typical chest pain and possible congestive heart failure,  per the family, who presents to the emergency department with a history  of leg pain for the past five days and chest pain for the past three  days.  He describes his increasingly worsening chest pain as non-  radiating and states that it increases in pain with arm movements or  deep respiration.  He states it doesn't feel like my heart has enough  space to move.  He  describes his left leg pain as constant and says his  left leg is swollen and it hurts when anyone touches it.  He reports  having a decreased appetite and nausea due to the pain.  In the  emergency department the patient received sublingual 0.4 mg of  nitroglycerin, which did not change his chest pain.   PHYSICAL EXAMINATION:  VITAL SIGNS:  Temperature 97 degrees, blood  pressure 129/82, pulse 59, respirations 18, O2 saturation 100% on room  air.  GENERAL:  Elderly male, laying comfortably in bed, in no acute distress.  HEENT:  He is anicteric.  Pupils equal, round, reactive to light and  accommodation.  ENT:  Deferred.  NECK:  Trachea midline without masses.  LUNGS:  Clear to auscultation bilaterally with shallow tachypneic  inspirations.  CARDIOVASCULAR:  A regular rate and rhythm with no rales, murmurs or  gallops.  ABDOMEN:  Bowel sounds positive.  Nontender, non-distended.  EXTREMITIES:  With 2+ pulses in all four extremities.  Left calf  swollen, warm and tender on examination.  GENITOURINARY:  Deferred.  SKIN:  Dry and without ulcerations or discolorations.  LYMPH:  Deferred.  MUSCULOSKELETAL:  Good muscle  tone.  NEUROLOGIC:  Grossly intact.  Alert and oriented x3.  The patient is a  good historian.   ADMISSION LABORATORY DATA:  Showed sodium of 137, potassium 3.9,  chloride 102, BUN 7, glucose 80.  Hemoglobin 15, hematocrit 45,  platelets 293, white blood cell count of 6.3.  Cardiac enzymes were also  drawn which showed a myoglobin of 79.4, CK-MB of 1.1 and a troponin I of  less than 0.05, all of which was normal.   HOSPITAL COURSE:  #1 - PULMONARY EMBOLISM, SECONDARY TO LEFT LEG DEEP  VENOUS THROMBOSIS:  The patient's chest pain started two days after his  leg pain, which suggested a deep venous thrombosis with pulmonary  embolism.  The diagnosis of pulmonary embolism was confirmed with a CT  with contrast, which showed bilateral embolisms in the lower lung lobes.  The patient was started on Lovenox 95 mg twice daily and Coumadin 10 mg  daily.  He received laboratory tests to check his PT and INR, to ensure  therapeutic goal of 2 to 3.  His INR showed consistent bridging and had  the early value starting on September 14, 2006, of 1.1 to 1.3 to 1.9.  He  was without signs or symptoms of thrombocytopenia or new thrombotic  events.  The patient's history is negative of traditional risk factors  for deep venous thrombosis, such as a history of previous deep venous  thrombosis, smoking vacationing or long periods of inactivity, surgery  or medication use, and a hyper-coagulable panel was advisable when the  patient was finished with his regimen of Coumadin.  Upon discharge the  patient will continue to take 7.5 mg of Coumadin daily for one year, or  up to life-long.  He will follow up with Dr. Yetta Barre and Dr.  Alexandria Lodge at [facility].   #2 - HYPERTENSION:  The patient's hypertension was stable and well-  controlled throughout the hospital course.  The patient received 25 mg  of hydrochlorothiazide and will continue taking this medication upon   discharge.   #3 - HYPERLIPIDEMIA/[place] HEART RISK FACTORS FOR CARDIOVASCULAR  DISEASE:  In an attempt to better control the risk factors for  cardiovascular disease in [name], the [place] score sheet was  used to calculate his overall cardiovascular risk  factors.  There was  some criteria such as age, controlled blood pressure, history of deep  venous thrombosis, lipid panel results, etc.  The patient began taking  Lipitor 40 mg to lower his LDL to less than 100, and will continue this  medication upon discharge.   #4 - HYPERCOAGULABILITY/THROMBOPHILIA:  Due to Mr. Brunsman lack of  traditional risk factors for deep venous thrombosis, it was advised that  to further investigate possible comorbidities that could cause his deep  venous thromboses.  Mr. Sagar does not have signs of infection such as  fever or leukocytosis, and his urinalysis and urine culture were  negative for any symptomatic urinary tract infection.  The pathology  reports of his past colonoscopy in 2004, showed no evidence of  colorectal cancer.  His past transurethral resection of the prostate  procedure in 2005, showed no evidence of prostate cancer on biopsy.  His  PSA in 2004, was 7; however, his follow-up PSA was normal on September 16, 2006, at 1.1.  He does not have hypercalcemia or increased homocysteine  levels in the blood.  He has been negative for other hypercoagulable  markers such as positive lupus markers, and has also tested negative for  protein-C as anti-thrombin-3 deficiencies.   #5 - PAIN ON AMBULATION:  The patient's pain is improved but he still  has difficulty to pain on ambulation, due to his sore left leg.  The  patient will receive Vicodin 5/500 mg q.6h. p.r.n.  He was shown to have  an O2 saturation of consistently above 94% on ambulation, and could not  bear full weight.  OT and PT recommended limited home health nursing  care and the use of a one-point cane.   DISCHARGE  VITAL SIGNS:  Temperature 98.2 degrees, pulse 51, respirations  20 with O2 saturation of 99% on 2 liters of oxygen, blood pressure  90/52.  Previous blood pressure was 128/73.   LABORATORY DATA:  White blood cell count of 6.9, hemoglobin 13.3,  hematocrit 38.5, platelets 309.  No pending labs.      Yetta Barre, M.D.  Electronically Signed      Eliseo Gum, M.D.  Electronically Signed    SS/MEDQ  D:  09/16/2006  T:  09/16/2006  Job:  [id number]

## 2010-06-12 NOTE — Discharge Summary (Signed)
NAMELUNDY, COZART               ACCOUNT NO.:  [id number]   MEDICAL RECORD NO.:  [id number]          PATIENT TYPE:  INP   LOCATION:  5708                         FACILITY:  [facility]   PHYSICIAN:  Eliseo Gum, M.D.   DATE OF BIRTH:  Jan 25, 1941   DATE OF ADMISSION:  09/13/2006  DATE OF DISCHARGE:  09/16/2006                               DISCHARGE SUMMARY   DISCHARGE DIAGNOSES:  1. Bilateral pulmonary embolism caused by left calf  deep venous      thrombosis.  2. Hypertension.  3. Hyperlipidemia.   DISCHARGE MEDICATIONS:  1. Hydrochlorothiazide 75 mg p.o. daily.  2. Aspirin 325 mg p.o. daily.  3. Lovenox 95 mg p.o. b.i.d. stop on August 21.  4. Coumadin 7.5 mg p.o. daily.  5. Vicodin 5/500 p.o. q.6 h. p.r.n. for pain.   DISPOSITION AND FOLLOW UP:  1. Follow up with [facility] outpatient clinic with Dr. Yetta Barre on September 9 at 9:15 a.m. Continue to follow up PT/INR and      any more symptoms associated with pulmonary embolism.  2. Follow up with [facility] outpatient clinic with Dr. Alexandria Lodge on      Monday, August 25 at 3:30 p.m. to make adjustments with Coumadin.  3. Follow up with [facility] outpatient clinic on Thursday, August 21      at 10 a.m. to have PT/INR values checked.  4. The patient also has home health/physical therapy set up to help      with ambulation, and was sent home on a single point cane.   PROCEDURES PERFORMED/IMAGES PERFORMED:  1. August 16, an EKG which showed bradycardia.  2. August 16 chest x-ray showed no abnormalities.  3. August 16 CT with contrast showed filling defects of the pulmonary      arteries to the lower lobes bilaterally, right greater than left      side -- consistent with pulmonary embolisms.   LABS PERFORMED:  1. August 16, 17, and 18, 2008: CBC, BMET, and CMET.  2. On August 16 urinalysis showed no abnormalities.  3. August 16 urine cultures showed no organisms present.  4. August 18  PSA was normal.   CONSULTATIONS:  None.   BRIEF HISTORY AND PHYSICAL:  Mr. Deruiter is a 70 year old African-  American male with a significant past medical history for hypertension,  atypical chest pain, and possible CHF per family.  He presented to the  ED with a history of leg pain for the past 5 days and chest pain for the  past 3 days.  He describes his gradually increasing chest pain as  nonradiating; and it also increases with movements or with increased  respiration.  He states it does not feel like my heart has enough space  to move.  He describes his left leg pain as constant; and says that his  left leg is swollen and it hurts when anyone touches it.  He works  having decreased appetite nausea, due to the pain.  In the ED the  patient received sublingual 0.4 mg of nitroglycerin, which did not  changed his chest pain.   PHYSICAL EXAM:  VITAL SIGNS:  Temperature of 97.0, blood pressure of  129/82, pulse of 59, and respiratory rate of 18.  O2 saturation of 100%  on room air.  GENERAL:  An elderly male, laying comfortably in bed in no acute  distress.  EYES:  Anicteric.  Pupils equal, round, reactive to light and  accommodation.  ENT deferred.  NECK:  Trachea midline without masses.  RESPIRATORY:  Lungs clear to auscultation bilaterally with shallow  tachypnea, respirations.  CARDIOVASCULAR:  Regular rate and rhythm with no rubs, murmurs, or  gallops.  GASTROINTESTINAL:  Bowel sounds positive nontender, nondistended.  EXTREMITIES:  2+ pulses in all four extremities.  Left calf swollen,  warm, and tender on exam.  GENITOURINARY:  Deferred.  SKIN:  Dry and without ulcerations or discolorations.  LYMPH:  Deferred.  MUSCULOSKELETAL:  Good muscle tone.  NEUROLOGIC:  Grossly intact.  PSYCHIATRIC:  Alert and oriented x3.  The patient is a good historian.   ADMISSION LABORATORY FINDINGS:  Dictation ended at this point.      Yetta Barre, M.D.       Eliseo Gum, M.D.     SS/MEDQ   D:  09/16/2006  T:  09/16/2006  Job:  [id number]

## 2010-06-12 NOTE — Assessment & Plan Note (Signed)
[facility]                            CARDIOLOGY OFFICE NOTE   NAME:[name], [name]                      MRN:          [id number]  DATE:11/20/2007                            DOB:          03/02/1940    ADDENDUM:   ASSESSMENT AND PLAN:  1. I am going to set him up for an abdominal ultrasound to screen for      an abdominal aortic aneurysm given his age between 71 and 89 and      his history of smoking.     Marca Ancona, MD     DM/MedQ  DD: 11/20/2007  DT: 11/21/2007  Job #: [id number]   cc:   Manning Charity, MD

## 2010-06-15 NOTE — H&P (Signed)
NAME:  [name], [name]                         ACCOUNT NO.:  [id number]   MEDICAL RECORD NO.:  [id number]                   PATIENT TYPE:  INP   LOCATION:  0002                                 [facility]ACILITY[facility]  [facility]   PHYSICIAN:  Maretta Bees. Vonita Moss, M.D.             DATE OF BIRTH:  1940/04/22   DATE OF ADMISSION:  04/01/2003  DATE OF DISCHARGE:                                HISTORY & PHYSICAL   HISTORY:  This 70 year old black male has had 10-year history of voiding  symptoms and weak stream and has never really been actively treated but was  on 5 mg of terazosin for hypertension which has still not relieved his  voiding complaints, he went into urinary retention and presented to my  office yesterday with Foley catheter in place.  He had three brothers that  had history of BPH and prostatism and what sounds like TUR of the prostate.  There was also a question of some prostate cancer by history.   I counseled him about therapeutic intervention after finding a benign-  feeling prostate and I did get a PSA although it could be elevated from his  Foley catheter being in place.  Felt appropriate to go ahead and advise TUR  of the prostate at this time.  Obviously if malignancy was found it could be  treated postoperatively. With a radical prostatectomy or radiation therapy  if necessary.  In the meantime he is very anxious about getting his Foley  out which was bothersome to him and he has been voiding satisfactorily  again.  He was advised about the risks of hemorrhage, infection, very small  risk of incontinence, some risk for sexual dysfunction.   PAST MEDICAL HISTORY:  He has hypertension.  Denies diabetes, heart attacks,  or strokes.   MEDICATIONS:  Medications include 5 mg of terazosin and 25 mg of  hydrochlorothiazide a day.   ALLERGIES:  None.   PAST SURGICAL HISTORY:  He has had left knee surgery and surgery on his  right hand many years ago.   SOCIAL HISTORY:  He quit smoking 30  years ago.  He drinks only occasional  alcohol but none lately.   FAMILY HISTORY:  Positive for prostate cancer, heart disease, and  hypertension.   REVIEW OF SYSTEMS:  Noted on health history form.   PHYSICAL EXAMINATION:  VITAL SIGNS:  His blood pressure is 120/80, pulse is  80, and temperature is 98.6.  GENERAL:  He is alert and oriented.  SKIN:  Warm and dry.  NECK:  Supple.  CHEST:  No respiratory distress.  Heart tones regular.  ABDOMEN:  Soft, nontender.  GU/RECTAL:  External genitalia reveals presence of a Foley catheter and  prostate feels benign and smooth.   IMPRESSION:  1. Benign prostatic hypertrophy and urinary retention.  2. Hypertension.   PLAN:  TUR of the prostate.  Maretta Bees. Vonita Moss, M.D.    LJP/MEDQ  D:  04/01/2003  T:  04/01/2003  Job:  [id number]

## 2010-06-15 NOTE — Op Note (Signed)
NAMEJARREN, PARA               ACCOUNT NO.:  [id number]   MEDICAL RECORD NO.:  [id number]          PATIENT TYPE:  AMB   LOCATION:  ENDO                         FACILITY:  [facility]   PHYSICIAN:  Jordan Hawks. Elnoria Howard, MD    DATE OF BIRTH:  09-01-1940   DATE OF PROCEDURE:  01/13/2004  DATE OF DISCHARGE:                                 OPERATIVE REPORT   PROCEDURE:  Colonoscopy.   INDICATIONS FOR PROCEDURE:  Screening colonoscopy.   PERFORMING PHYSICIAN:  Jordan Hawks. Elnoria Howard, MD   CONSENT:  Informed consent was obtained from the patient describing the  risks of bleeding, infection, perforation, medication reactions, a 10% miss  rate for small colon cancer or polyp and the risks of death all of which are  not exclusive of any other complications that may occur.   PRE-PROCEDURE PHYSICAL:  CARDIAC:  Regular rate and rhythm.  LUNGS:  Clear to auscultation bilaterally.  ABDOMEN:  Soft, non-tender and non-distended.   MEDICATIONS:  Versed 5 mg IV and Fentanyl 50 mcg IV.   DESCRIPTION OF PROCEDURE:  After adequate sedation was achieved, a rectal  examination was performed which was negative for any palpable abnormalities.  The colonoscope was then inserted from the anus and advanced under direct  visualization to the terminal ileum without difficulty. The patient was  noted to have a very good prep.  Photo documentation of the terminal ileum  and cecum was obtained.  Upon slow withdrawal of the colonoscope, there is  no evidence of any masses or polyps or vascular abnormalities in the cecum,  ascending, transverse, descending or sigmoid colon.  The patient is noted to  have multiple scattered diverticula throughout the entire colon with the  greatest concentration in the descending and sigmoid region.  The ascending  colon was noted to have three to four small ulcerations that were non-  bleeding.  The etiology of these ulcerations is unknown.  Multiple biopsies  were taken of this area.  Upon  initial encounter of one of the ulcerations,  it was felt that this could possibly be malignant.  However, in finding that  the other ulcerations existed this likelihood decreased. However, the  initial ulceration was tattooed with 3 or 4 ml of indigo carmine.  The  colonoscope was then withdrawn into the rectum and a retroflexion was  performed.  There was no evidence of any masses. However, he is documented  to have mild external hemorrhoids.  The colonoscope was then straightened  and withdrawn from the patient.  The procedure was terminated.   PLAN:  1.  The plan at this time is to follow-up on biopsies.  2.  Return to the clinic in two weeks.       PDH/MEDQ  D:  01/13/2004  T:  01/13/2004  Job:  [id number]   cc:   Donald Pore, MD  Internal Medicine Resident - [address]  [place]  [place] [address]  Fax: [phone]

## 2010-06-15 NOTE — Op Note (Signed)
NAME:  GILMER, KAMINSKY                         ACCOUNT NO.:  [id number]   MEDICAL RECORD NO.:  [id number]                   PATIENT TYPE:  INP   LOCATION:  0002                                 FACILITY:  [facility]   PHYSICIAN:  Maretta Bees. Vonita Moss, M.D.             DATE OF BIRTH:  06/29/1940   DATE OF PROCEDURE:  04/01/2003  DATE OF DISCHARGE:                                 OPERATIVE REPORT   PREOPERATIVE DIAGNOSES:  1. Benign prostatic hypertrophy.  2. Urinary retention.   POSTOPERATIVE DIAGNOSES:  1. Benign prostatic hypertrophy.  2. Urinary retention.   PROCEDURE:  Transurethral resection prostate.   SURGEON:  Maretta Bees. Vonita Moss, M.D.   ANESTHESIA:  General.   INDICATIONS:  This 70 year old, black male has had a 10 year history of  prostatism despite being on 5 mg of Hytrin, and he went into urinary  retention and apparently has had brothers who have significant bladder  outlet obstructive symptoms requiring therapy, and there is a question of a  family history of prostate cancer.  He is brought to the OR today to relieve  his bladder outlet obstruction with TUR of the prostate.   DESCRIPTION OF PROCEDURE:  The patient was brought to the operating room and  placed in lithotomy position.  External genitalia were prepped and draped in  the usual fashion.  He was sounded from 49 to 30 [place] without difficulty.  A 28 French resectoscope sheath was inserted.  The bladder had trabeculation  and irritation from the Foley catheter but no papillary tumors, stones, or  other lesions.  He had mainly bilobar hypertrophy.  Resection was begun.  He  had very vascular inflamed gland but once I got down the capsule, I was able  to obtain very good hemostasis.  The prostate was resected on the capsule  bilaterally.  There was a small amount of anterior tissue requiring  resection.  At this point, the prostatic fossa was well-excavated.  There  was good hemostasis.  Residual chips were removed  from the bladder.  The  ureteral orifices were felt to be intact.  The external sphincter seemed to  be intact as the scope was removed with continued good hemostasis.  A 24  French 30 mL Foley was inserted and connected to closed drainage with  crystal clear irrigation.  Foley catheter was placed on traction, and he was  taken to the recovery room in good condition, having tolerated the procedure  well.  The estimated blood loss was approximately 250 mL.                                               Maretta Bees. Vonita Moss, M.D.    LJP/MEDQ  D:  04/01/2003  T:  04/01/2003  Job:  [id number]

## 2010-06-15 NOTE — Discharge Summary (Signed)
NAME:  [name], [name]                         ACCOUNT NO.:  [id number]   MEDICAL RECORD NO.:  [id number]                   PATIENT TYPE:  INP   LOCATION:  0343                                 FACILITY:  [facility]   PHYSICIAN:  Maretta Bees. Vonita Moss, M.D.             DATE OF BIRTH:  01-29-1940   DATE OF ADMISSION:  04/01/2003  DATE OF DISCHARGE:  04/03/2003                                 DISCHARGE SUMMARY   FINAL DIAGNOSES:  1. Benign prostatic hypertrophy and urinary retention.  2. Hypertension.   PROCEDURE:  Transurethral resection of prostate on April 01, 2003.   HISTORY:  This 70 year old black male was admitted because he had gone into  urinary retention despite being on 5 mg of terazosin for hypertension which  should have also helped his voiding. There is a question of family history  of prostate cancer. Preoperative PSA was a little 7, but that was right  after the Foley catheter was inserted.   He was in relatively good health, except for some hypertension. He denies  diabetes, heart attack, or strokes.   PHYSICAL EXAMINATION:  Noncontributory except for benign-feeling prostate  and Foley catheter in place.   HOSPITAL COURSE:  After admission he underwent TUR of the prostate and his  postoperative course was benign. Foley catheter was removed on April 03, 2003, and he voided satisfactorily after that, and was ready for discharge  that day to go home on diet as tolerated and to continue 5 mg of terazosin.  He was also sent home on Cipro 250 mg b.i.d. for five days. He is to be on  limited activity for a month and return to the office in two weeks for  follow-up. He was sent home in improved condition.                                               Maretta Bees. Vonita Moss, M.D.    LJP/MEDQ  D:  04/11/2003  T:  04/11/2003  Job:  [id number]

## 2010-07-10 ENCOUNTER — Other Ambulatory Visit: Payer: Self-pay | Admitting: *Deleted

## 2010-07-11 MED ORDER — OMEPRAZOLE 40 MG PO CPDR: 40.0000 mg | DELAYED_RELEASE_CAPSULE | Freq: Every day | ORAL | Status: DC

## 2010-08-07 ENCOUNTER — Other Ambulatory Visit: Payer: Self-pay | Admitting: Internal Medicine

## 2010-08-31 ENCOUNTER — Encounter: Payer: Self-pay | Admitting: Internal Medicine

## 2010-08-31 ENCOUNTER — Emergency Department (HOSPITAL_COMMUNITY): Payer: PRIVATE HEALTH INSURANCE

## 2010-08-31 ENCOUNTER — Inpatient Hospital Stay (HOSPITAL_COMMUNITY)
Admission: EM | Admit: 2010-08-31 | Discharge: 2010-09-04 | DRG: 690 | Disposition: A | Discharge status: Home / Self Care | Payer: PRIVATE HEALTH INSURANCE | Attending: Internal Medicine | Admitting: Internal Medicine

## 2010-08-31 DIAGNOSIS — N3000 Acute cystitis without hematuria: Principal | ICD-10-CM | POA: Diagnosis present

## 2010-08-31 DIAGNOSIS — M1710 Unilateral primary osteoarthritis, unspecified knee: Secondary | ICD-10-CM | POA: Diagnosis present

## 2010-08-31 DIAGNOSIS — F3289 Other specified depressive episodes: Secondary | ICD-10-CM | POA: Diagnosis present

## 2010-08-31 DIAGNOSIS — E7800 Pure hypercholesterolemia, unspecified: Secondary | ICD-10-CM | POA: Diagnosis present

## 2010-08-31 DIAGNOSIS — F329 Major depressive disorder, single episode, unspecified: Secondary | ICD-10-CM | POA: Diagnosis present

## 2010-08-31 DIAGNOSIS — A6001 Herpesviral infection of penis: Secondary | ICD-10-CM | POA: Diagnosis present

## 2010-08-31 DIAGNOSIS — Z86718 Personal history of other venous thrombosis and embolism: Secondary | ICD-10-CM

## 2010-08-31 DIAGNOSIS — D649 Anemia, unspecified: Secondary | ICD-10-CM | POA: Diagnosis present

## 2010-08-31 DIAGNOSIS — A498 Other bacterial infections of unspecified site: Secondary | ICD-10-CM | POA: Diagnosis present

## 2010-08-31 DIAGNOSIS — N400 Enlarged prostate without lower urinary tract symptoms: Secondary | ICD-10-CM | POA: Diagnosis present

## 2010-08-31 DIAGNOSIS — E785 Hyperlipidemia, unspecified: Secondary | ICD-10-CM | POA: Diagnosis present

## 2010-08-31 DIAGNOSIS — Z79899 Other long term (current) drug therapy: Secondary | ICD-10-CM

## 2010-08-31 DIAGNOSIS — Z7982 Long term (current) use of aspirin: Secondary | ICD-10-CM

## 2010-08-31 DIAGNOSIS — Z86711 Personal history of pulmonary embolism: Secondary | ICD-10-CM

## 2010-08-31 DIAGNOSIS — I10 Essential (primary) hypertension: Secondary | ICD-10-CM | POA: Diagnosis present

## 2010-08-31 DIAGNOSIS — B002 Herpesviral gingivostomatitis and pharyngotonsillitis: Secondary | ICD-10-CM | POA: Diagnosis present

## 2010-08-31 LAB — URINE MICROSCOPIC-ADD ON

## 2010-08-31 LAB — CBC
HCT: 36.3 % — ABNORMAL LOW (ref 39.0–52.0)
Hemoglobin: 12.8 g/dL — ABNORMAL LOW (ref 13.0–17.0)
MCH: 27.7 pg (ref 26.0–34.0)
MCHC: 35.3 g/dL (ref 30.0–36.0)
MCV: 78.6 fL (ref 78.0–100.0)
RBC: 4.62 MIL/uL (ref 4.22–5.81)

## 2010-08-31 LAB — DIFFERENTIAL
Basophils Relative: 1 % (ref 0–1)
Lymphocytes Relative: 22 % (ref 12–46)
Lymphs Abs: 2.5 10*3/uL (ref 0.7–4.0)
Monocytes Absolute: 0.7 10*3/uL (ref 0.1–1.0)
Monocytes Relative: 6 % (ref 3–12)
Neutro Abs: 8 10*3/uL — ABNORMAL HIGH (ref 1.7–7.7)
Neutrophils Relative %: 68 % (ref 43–77)

## 2010-08-31 LAB — BASIC METABOLIC PANEL
BUN: 11 mg/dL (ref 6–23)
CO2: 26 mEq/L (ref 19–32)
Calcium: 8.8 mg/dL (ref 8.4–10.5)
Glucose, Bld: 77 mg/dL (ref 70–99)
Sodium: 134 mEq/L — ABNORMAL LOW (ref 135–145)

## 2010-08-31 LAB — URINALYSIS, ROUTINE W REFLEX MICROSCOPIC
Bilirubin Urine: NEGATIVE
Nitrite: POSITIVE — AB
Specific Gravity, Urine: 1.014 (ref 1.005–1.030)
Urobilinogen, UA: 1 mg/dL (ref 0.0–1.0)
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
## 2010-09-01 DIAGNOSIS — R319 Hematuria, unspecified: Secondary | ICD-10-CM

## 2010-09-01 DIAGNOSIS — N390 Urinary tract infection, site not specified: Secondary | ICD-10-CM

## 2010-09-01 LAB — BASIC METABOLIC PANEL
CO2: 26 mEq/L (ref 19–32)
Calcium: 8.8 mg/dL (ref 8.4–10.5)
Creatinine, Ser: 0.95 mg/dL (ref 0.50–1.35)
GFR calc non Af Amer: >60 mL/min (ref >60)
Glucose, Bld: 82 mg/dL (ref 70–99)

## 2010-09-01 LAB — LIPID PANEL
Cholesterol: 145 mg/dL (ref 0–200)
LDL Cholesterol: 95 mg/dL (ref 0–99)
VLDL: 16 mg/dL (ref 0–40)

## 2010-09-01 LAB — CBC
MCH: 26.9 pg (ref 26.0–34.0)
MCHC: 34.4 g/dL (ref 30.0–36.0)
MCV: 78.3 fL (ref 78.0–100.0)
Platelets: 250 10*3/uL (ref 150–400)
RBC: 4.46 MIL/uL (ref 4.22–5.81)
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
## 2010-09-02 LAB — URINE CULTURE
Colony Count: >=100000
Culture  Setup Time: 201208032034
Culture  Setup Time: 201208041150

## 2010-09-02 LAB — BASIC METABOLIC PANEL
BUN: 14 mg/dL (ref 6–23)
GFR calc Af Amer: >60 mL/min (ref >60)
GFR calc non Af Amer: >60 mL/min (ref >60)
Potassium: 3.6 mEq/L (ref 3.5–5.1)
Sodium: 139 mEq/L (ref 135–145)

## 2010-09-02 LAB — CBC
HCT: 34 % — ABNORMAL LOW (ref 39.0–52.0)
MCH: 27.2 pg (ref 26.0–34.0)
MCHC: 34.7 g/dL (ref 30.0–36.0)
MCV: 78.3 fL (ref 78.0–100.0)
RDW: 15.6 % — ABNORMAL HIGH (ref 11.5–15.5)

## 2010-09-02 LAB — HEPATIC FUNCTION PANEL
AST: 16 U/L (ref 0–37)
Bilirubin, Direct: 0.1 mg/dL (ref 0.0–0.3)
Total Bilirubin: 0.3 mg/dL (ref 0.3–1.2)

## 2010-09-03 ENCOUNTER — Encounter: Payer: Self-pay | Admitting: Dietician

## 2010-09-03 DIAGNOSIS — R319 Hematuria, unspecified: Secondary | ICD-10-CM

## 2010-09-03 DIAGNOSIS — N390 Urinary tract infection, site not specified: Secondary | ICD-10-CM

## 2010-09-03 LAB — FERRITIN: Ferritin: 175 ng/mL (ref 22–322)

## 2010-09-03 LAB — BASIC METABOLIC PANEL
BUN: 13 mg/dL (ref 6–23)
CO2: 28 mEq/L (ref 19–32)
Chloride: 102 mEq/L (ref 96–112)
Creatinine, Ser: 0.92 mg/dL (ref 0.50–1.35)
GFR calc Af Amer: >60 mL/min (ref >60)
Glucose, Bld: 82 mg/dL (ref 70–99)

## 2010-09-03 LAB — CBC
HCT: 36.1 % — ABNORMAL LOW (ref 39.0–52.0)
MCV: 78.5 fL (ref 78.0–100.0)
RDW: 15.5 % (ref 11.5–15.5)
WBC: 6.1 10*3/uL (ref 4.0–10.5)

## 2010-09-03 LAB — VITAMIN B12: Vitamin B-12: 235 pg/mL (ref 211–911)

## 2010-09-03 LAB — IRON AND TIBC: UIBC: 188 ug/dL

## 2010-09-03 LAB — FOLATE: Folate: 5.6 ng/mL

## 2010-09-03 LAB — OCCULT BLOOD X 1 CARD TO LAB, STOOL: Fecal Occult Bld: NEGATIVE

## 2010-09-04 LAB — BASIC METABOLIC PANEL
BUN: 12 mg/dL (ref 6–23)
CO2: 28 mEq/L (ref 19–32)
Chloride: 102 mEq/L (ref 96–112)
Creatinine, Ser: 0.98 mg/dL (ref 0.50–1.35)
Glucose, Bld: 79 mg/dL (ref 70–99)

## 2010-09-04 LAB — CBC
Hemoglobin: 12 g/dL — ABNORMAL LOW (ref 13.0–17.0)
MCH: 26.6 pg (ref 26.0–34.0)
Platelets: 308 10*3/uL (ref 150–400)
RBC: 4.51 MIL/uL (ref 4.22–5.81)
WBC: 5.9 10*3/uL (ref 4.0–10.5)

## 2010-09-06 LAB — CULTURE, BLOOD (ROUTINE X 2): Culture  Setup Time: 201208032145

## 2010-09-14 ENCOUNTER — Ambulatory Visit (INDEPENDENT_AMBULATORY_CARE_PROVIDER_SITE_OTHER): Payer: PRIVATE HEALTH INSURANCE | Admitting: Internal Medicine

## 2010-09-14 ENCOUNTER — Encounter: Payer: Self-pay | Admitting: Internal Medicine

## 2010-09-14 DIAGNOSIS — I10 Essential (primary) hypertension: Secondary | ICD-10-CM

## 2010-09-14 DIAGNOSIS — N390 Urinary tract infection, site not specified: Secondary | ICD-10-CM

## 2010-09-14 DIAGNOSIS — B009 Herpesviral infection, unspecified: Secondary | ICD-10-CM | POA: Insufficient documentation

## 2010-09-14 DIAGNOSIS — M1990 Unspecified osteoarthritis, unspecified site: Secondary | ICD-10-CM

## 2010-09-14 DIAGNOSIS — F329 Major depressive disorder, single episode, unspecified: Secondary | ICD-10-CM

## 2010-09-14 HISTORY — DX: Herpesviral infection, unspecified: B00.9

## 2010-09-14 MED ORDER — MIRTAZAPINE 15 MG PO TABS: 15.0000 mg | ORAL_TABLET | Freq: Every day | ORAL | Status: DC
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
## 2010-09-14 NOTE — Patient Instructions (Addendum)
Continue all your medications as prescribed.  Keep your group appointment to help keep you healthy.  Follow up with me in 6 months.

## 2010-09-20 DIAGNOSIS — N390 Urinary tract infection, site not specified: Secondary | ICD-10-CM | POA: Insufficient documentation

## 2010-09-20 NOTE — Assessment & Plan Note (Signed)
We will continue his current regimen at this time.  We discussed a possible trial off Celexa sometime in the future and he will consider it but states that he feels he still needs the Remeron to sleep.  We will refill it at this time and encourage him to continue to let us know if he is having any difficulty.

## 2010-09-20 NOTE — Assessment & Plan Note (Signed)
BP Readings from Last 3 Encounters:  09/14/10 137/79  05/25/10 159/78  05/25/10 162/90   Blood pressure today is excellent.  We will continue his current dose of Florinef and lisinopril since he is not having any side effects.

## 2010-09-20 NOTE — Assessment & Plan Note (Signed)
He has no current symptoms after treatment in the hospital.  We will continue to monitor him. There is no indication to recheck his UA or culture.

## 2010-09-20 NOTE — Assessment & Plan Note (Signed)
No apparent side effects from the cortisone shot and he is feeling well.  I encouraged him to keep contact with his orthopedist and we will continue to follow as well.

## 2010-09-20 NOTE — Assessment & Plan Note (Signed)
We discussed the cause of the lesions as well as signs to look for.  At this time he has no indication for suppressive therapy and would like to only treat if he becomes symptomatic which I think is reasonable.  I encouraged him to come to clinic if he has an acute flare and we can test to be sure what we are dealing with and treat.

## 2010-10-04 ENCOUNTER — Other Ambulatory Visit: Payer: Self-pay | Admitting: Internal Medicine

## 2010-10-05 ENCOUNTER — Telehealth: Payer: Self-pay | Admitting: *Deleted

## 2010-10-05 NOTE — Telephone Encounter (Signed)
Pt's daughter had called yesterday and left a message about completion of handicap sticker per the doctor. I called her and she stated her father has a brace,has SOB w/distance, and has pain and needs the sticker.  I talked w/Dr. Tonny Branch, who saw the pt at his last OV; he stated he does not remember pt having a brace nor these problems.  But if he does, he needs to be seen at the clinic. I called and informed pt's daughter about scheduling another appt; she stated she need to talk to her father first.  And will call back if he agree to come in for an appt.

## 2010-10-06 NOTE — Discharge Summary (Signed)
[name], [name] NO.:  [id number]  MEDICAL RECORD NO.:  [id number]       FACILITY:  [facility]  ATTENDING PHYSICIAN:  Ileana Roup, M.D.  DATE OF BIRTH:  May 28, 1940  DATE OF ADMISSION:  08/31/2010 DATE OF DISCHARGE:  09/04/2010                              DISCHARGE SUMMARY   CC: OPC and PRIMARY CARE PHYSICIAN:  Dr. Leodis Sias.  DISCHARGE DIAGNOSES: 1. Acute complicated cystitis/pyelonephritis. 2. Osteoarthritis. 3. 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 Normocytic anemia. 5. Hypertension. 6. History of hyperlipidemia. 7. History of Depression. 8. History of pulmonary embolism with a deep vein thrombosis in August     2008. 9.History of a benign prostate hyperplasia. 10.History of diverticulitis. 11.History of syncopal episodes, suspect neurocardiogenic.  January     2012, EF 60-65%, mild aortic root dilation.  DISCHARGE MEDICATIONS: 1. Aspirin 81 mg p.o. daily. 2. Citalopram 20 mg 1 tablet p.o. daily. 3. Mirtazapine 15 mg 1 tablet p.o. daily. 4. Fludrocortisone 0.05 mg take 1 tablet p.o. daily. 5. Lisinopril 20 mg take 1 tablet p.o. daily. 6. Double strength Bactrim 800/160 mg take 1 tablet p.o. twice daily.  DISPOSITION AND FOLLOWUP:    1. followup appointment with his PCP, Dr.Christopher Pribula at 3:15pm     on August 17th, 2012 at [facility]  to evaluate his normocytic anemia,intermittent loose stools and UTI resolution.   2. followup appointment with his Orthopedics Dr. Althea Charon at 10:00am on     August 8th, 2012 at [facility].      The patient's right knee swelling/pain secondary to osteoarthritis had     significantly improved on discharge with less swelling noted and no pain     reported by the patient.   PROCEDURES PERFORMED:  No procedures performed during this hospitalization.  CONSULTATIONS:  No consultations done during this hospitalization.  ADMISSION  HISTORY:[name] is a 70 year old man with a history of a BPH who presents with a painful hematuria on August 31, 2010.  His urine has been red with visible clots.  He also experienced a burning with urination, urgency, suprapubic pain, and increased frequency.  The blood begins with onset of urination and it continued throughout urination. He has also had trouble starting a stream and emptying his bladder because of the dysuria.  He reports bilateral flank pain, fever, polydipsia, mild productive cough, weakness and chills.  The patient denies recent vigorous exercise, trauma, nausea/vomiting, loss of appetite and the urethral discharge.  The patient also denies Tylenol or NSAID use, other than a daily baby aspirin.  He denies any cystoscopic procedures, recent visit to urologist, or recent catheter placement. His last transurethral resection of the prostate was 3-4 years ago.  The patient report experiencing similar hematuria after this procedure.  The patient also reports intermittent loose stools for past 2 months. He reports going 3-4 times daily about 3-4 times a week.  He denies mucosa, grease/oily stools or blood in his stools.  He also reports no dietary changes in the past 2 months and he has not noticed any association with anything he eats.  In addition, the denies associated abdominal pain, although he has increased urgency with this episode.  He reports a large amount of loose stools when he  has to go.  He reports a swollen, painful right knee for the past 3-4 months. Movements triggers the pain while rest alleviates the pain.  He denies the pain has limited to his daily activities.  Both the patient and his family reported they are trying to make appointment to see his orthopedic doctor Dr. Althea Charon.  For many years this patient reports blisters on his penis.  These blisters are erythematous and painful.  They also resolve in 3-4 days. The patient reports accompanying  dysuria without hematuria, fever or malaise.  Over the past year he has had episodes of blisters 4-5 times. He also reports recurrent oral ulcers on his tongue in the hard palate that are painful.  He had no lesions during this hospitalization.  PHYSICAL EXAMINATION:   VITAL SIGNS:  T 100.5, HR 84, BP 147/74, RR 16, O2 sat 96% on RA. GENERAL:  Alert, older man lying in the bed completely covered in blanket. EYES:  No scleral icterus, PERRL, EOMI. MOUTH:  Oral mucosa dry with no erythema, ulcers or exudates. NECK:  No cervical, supraclavicular lymphadenopathy. RESPIRATORY:  CTAP, no wheezing or crackles. CVS:  Normal S1, S2.  No M/R/G.  No JVD.  No carotid bruits noted. ABDOMEN:  BS positive, soft, NT/ND, no guarding or rebound, liver could not be palpated, mild CVA tenderness with palpation.   EXTREMITIES:  No pretibial edema, dorsalis pedis and radial 2+ bilaterally. GU:  No lesions/ulcers on penis or scrotum, no tenderness, no inguinal lymphadenopathy.  Prostate is smooth, enlarged and nontender. MUSCULOSKELETAL:  Right knee edematous without erythema or increased warmth compared to left knee, muscle strength hip flexion/extension 5/5 bilaterally, knee flexion/extension 5/5 bilaterally.  Pain with a right knee flexion and extension. NEURO:  CN II-XII grossly intact.  Fine touch and proprioception intact in lower extremities bilaterally. PSYCH:  Appropriate, alert and oriented x3.  LABORATORY FINDINGS:  Sodium 134, potassium 4.3, glucose 77, BUN 11, creatinine 0.96, calcium 8.8.  White blood cell count 11.8, hemoglobin 12.8, hematocrit 36.3, platelets 269.   Urinalysisshows turbid urine with ketones 15 mg/dL, large blood, protein 629  mg/dL, positive nitrates, and large leukocytes. Blood culture: no growth.  IMAGING RESULTS:  1. Chest x-ray: borderline cardiomegaly.  No pleural effusion or pneumothorax.  2. Abdomen and pelvis CT scan without contrast :     no renal, urethral, or  bladder calculi.  Bilateral renal cysts are stable since    the prior study consistent with benign etiology.  No free fluids noted.    Prostate gland is enlarged.  Bilateral inguinal hernias contained only    fat.  There is a fairly extensive diverticular disease in the left    colon, but no evidence for diverticulitis.  The terminal ileus is    normal.  The appendix is normal.  HOSPITAL COURSE:  1. Painful hematuria/UTI.  The patient was diagnosed with acute     complicated cystitis/pyelonephritis.  The patient's     history of BPH may increase the risk of this disease due to     obstruction.  CT scan was negative for renal, urethral, and a     bladder calculi.  The patient was initially started on ceftriaxone     1 g per day IV for 2.5 days.  Urine culture and a sensitivity     testing revealed more than 100,000 E-coli CFU with sensitivity to     all antibiotics.  On the second day of treatment, the patient  reported improvement in hematuria, dysuria, flank pain and     weakness.  In addition, leukocytosis had resolved.  The patient was     transitioned to oral antibiotics, double strength Bactrim twice a     day, on the third day of admission.  On discharge the patient     reported a resolution of all symptoms except for very mild dysuria.     The patient will be discharged on twice a day oral Bactrim to be     taken for next 6 days so that a 10-day course of antibiotic     treatment is completed.  2. Recurrent penile blisters/recurrent oral ulcers.      Given the recurrent painful nature of this blisters and 4-5     outbreaks each year, HSV serology tests were ordered with results of      positive HSV-1 and HSV-2.  The patient was informed of his     test results and was instructed to visit a PCP during outbreaks.     Based on confirmatory testing under the guidance of his PCP, a decision     can be made whether chronic, suppressive antiviral therapy may be     beneficial to  reduce the number of any outbreaks.  3. Right knee pain/swelling.  The patient has a history of     osteoarthritis that has affected both knees.  His right knee     initially showed signs of inflammation and edema exacerbated by     movement and alleviated by rest, characteristics of osteoarthritis.     On discharge,his knee had significantly improved with the reduction      in edema and no pain reported by the patient.  An appointment      with Dr. Althea Charon at [facility] was made for August 8 at          10:00 a.m.  4. Intermittent, chronic loose stools.  The patient reports     intermittent, loose stools over the past 2 months without abd pain.     He did report urgency and relieved with defecation.  His     stool guaiac test was negative.     The patient's last colonoscopy in December 2005 revealed colitis     and ulcers of ascending colon and diverticula in the descending     colon, but no polyps.       The patient will follow up with     his primary care physician Dr. Tonny Branch on August 9.  Under the     guidance of his PCP, the etiology of this episode can be further     explored.  5. Mild, normocytic anemia.  The patient had an episode of a     normocytic anemia during his last admission in January 2012.  The     anemia was considered more likely dilutional secondary to IV fluid     administration.  During this admission his hemoglobin was 12.8     initially.  During admission hemoglobin level was 11.8, 12.4, and     12.  An anemia panel revealed no signs of iron deficiency anemia or     B12/folate deficiency anemia.  The anemia can be monitored and further      evaluated by his PCP.  6. History of hyperlipidemia.  The patient's last echo was done in     January 2012 revealed a preserved LVEF of 60-65%.  Fasting lipid  revealed LDL 95 during this admission.  According to ATP III     guidelines with 2 CV risk factors, hypertension and a low HDL, the     goal LDL  would be less than 130.  Since the patient is currently at     goal, no further intervention will be needed.  7. Hypertension.  Continue treatment with lisinopril.  8. Depression.  Continue treatment with citalopram and mirtazapine.     The patient denied any feelings of sadness, hopelessness, loss of     appetite, loss of interest/pleasure.  His depression is effectively     managed with his current medications.  DISCHARGE VITALS:  Temperature 98.5, HR 71, BP 137/78, RR 18, O2 saturation 99% on room air.  DISCHARGE LABS:  WBC 5.9, hemoglobin 12.0, hematocrit 35.4, platelets 308.  Sodium 138, potassium 3.8, glucose 79, BUN 12, creatinine 0.98, calcium 9.1.    ______________________________ Dede Query, MD   ______________________________ Ileana Roup, M.D.    NL/MEDQ  D:  09/05/2010  T:  09/06/2010  Job:  [id number]  cc:   [facility]  Electronically Signed by Dede Query MD on 09/27/2010 06:58:52 AM Electronically Signed by Margarito Liner M.D. on 10/06/2010 02:28:41 PM

## 2010-10-12 ENCOUNTER — Ambulatory Visit: Payer: PRIVATE HEALTH INSURANCE | Admitting: Dietician

## 2010-11-09 LAB — CBC
HCT: 38.5 — ABNORMAL LOW
HCT: 38.5 — ABNORMAL LOW
HCT: 40.1
Hemoglobin: 13
Hemoglobin: 13.2
MCV: 82
MCV: 82.3
MCV: 83.3
MCV: 84
Platelets: 287
Platelets: 293
Platelets: 300
Platelets: 309
RBC: 4.78
WBC: 5.7
WBC: 6.2
WBC: 6.3
WBC: 6.9

## 2010-11-09 LAB — COMPREHENSIVE METABOLIC PANEL
AST: 19
Albumin: 3.2 — ABNORMAL LOW
Chloride: 104
Creatinine, Ser: 1.11
GFR calc Af Amer: >60
Sodium: 137
Total Bilirubin: 0.8

## 2010-11-09 LAB — I-STAT 8, (EC8 V) (CONVERTED LAB)
Acid-Base Excess: 2
Bicarbonate: 29.5 — ABNORMAL HIGH
Chloride: 102
HCT: 45
Hemoglobin: 15.3
Operator id: 196461
Sodium: 137
pCO2, Ven: 59 — ABNORMAL HIGH

## 2010-11-09 LAB — PROTIME-INR
INR: 1.1
INR: 1.1
Prothrombin Time: 14.1

## 2010-11-09 LAB — URINALYSIS, ROUTINE W REFLEX MICROSCOPIC
Nitrite: NEGATIVE
Specific Gravity, Urine: 1.008
Urobilinogen, UA: 0.2
pH: 6

## 2010-11-09 LAB — PROTEIN C, TOTAL: Protein C, Total: 66 % — ABNORMAL LOW (ref 70–140)

## 2010-11-09 LAB — LUPUS ANTICOAGULANT PANEL
DRVVT: 37.5 (ref 36.1–47.0)
PTT Lupus Anticoagulant: 60.4 — ABNORMAL HIGH (ref 36.3–48.8)
PTTLA Confirmation: 0 (ref <8.0)

## 2010-11-09 LAB — LIPID PANEL
Cholesterol: 140
HDL: 24 — ABNORMAL LOW
LDL Cholesterol: 100 — ABNORMAL HIGH
Total CHOL/HDL Ratio: 5.8

## 2010-11-09 LAB — URINE CULTURE

## 2010-11-09 LAB — ANTITHROMBIN III: AntiThromb III Func: 77 (ref 76–126)

## 2010-11-09 LAB — DIFFERENTIAL
Eosinophils Relative: 6 — ABNORMAL HIGH
Lymphocytes Relative: 40
Lymphs Abs: 2.6
Monocytes Relative: 7

## 2010-11-09 LAB — POCT CARDIAC MARKERS
Myoglobin, poc: 79.4
Operator id: 196461
Troponin i, poc: <0.05

## 2010-11-09 LAB — APTT
aPTT: 31
aPTT: 43 — ABNORMAL HIGH

## 2010-11-09 LAB — BETA-2-GLYCOPROTEIN I ABS, IGG/M/A
Beta-2 Glyco I IgG: 6 U/mL (ref <20)
Beta-2-Glycoprotein I IgA: 5 U/mL (ref <10)
Beta-2-Glycoprotein I IgM: <4 U/mL (ref <10)

## 2010-12-28 ENCOUNTER — Other Ambulatory Visit: Payer: Self-pay | Admitting: Internal Medicine

## 2011-03-11 IMAGING — CT CT HEAD W/O CM
1 of 2 series · 13 of 30 positions shown, 17 images · non-contrast
Comparison: None

CLINICAL DATA: Shortness of breath and fatigue.  Cough.
Sleepiness.

CT HEAD WITHOUT CONTRAST
TECHNIQUE: Contiguous axial images were obtained from the base of
the skull through the vertex without contrast.

[Series 2: brain · axial · 0.47mm/px · z∈[+103,+241]mm · 13 of 32 slices shown, 17 images]
[im 3/32  brain]
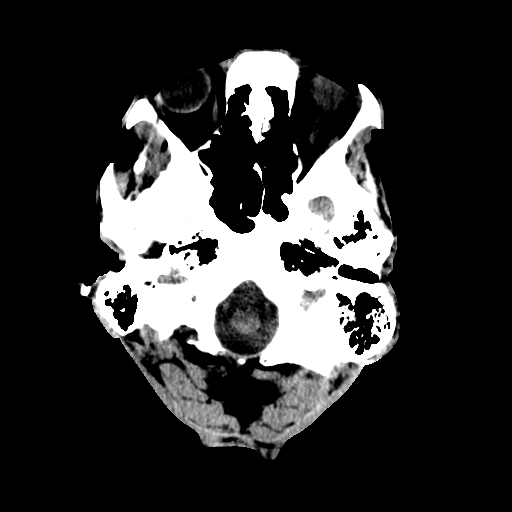
[im 3/32  bone]
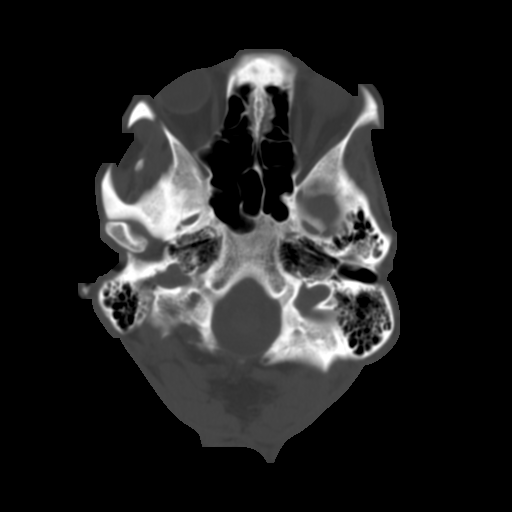
[im 5/32  brain]
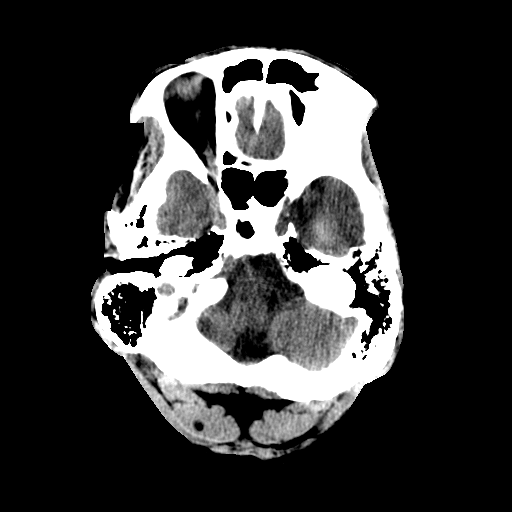
[im 7/32  brain]
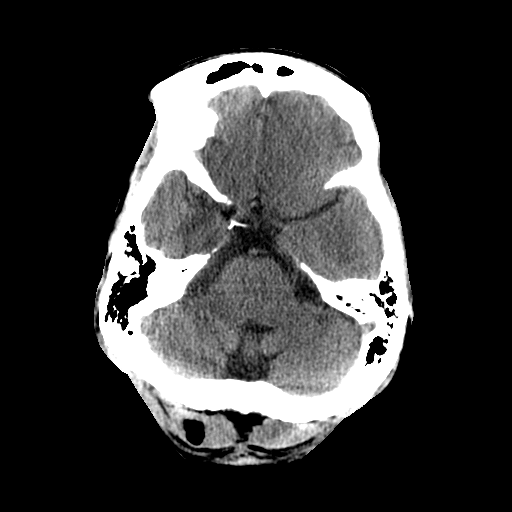
[im 9/32  brain]
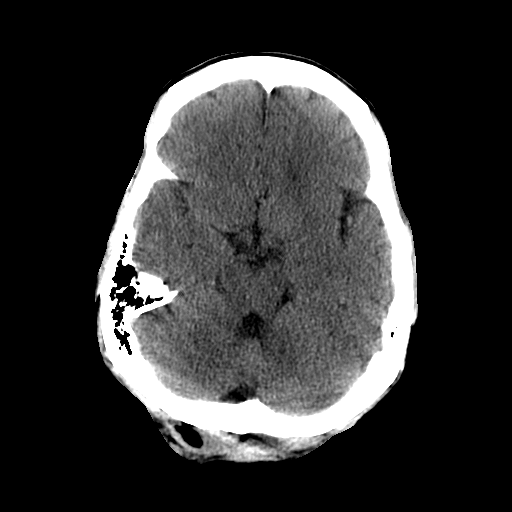
[im 12/32  brain]
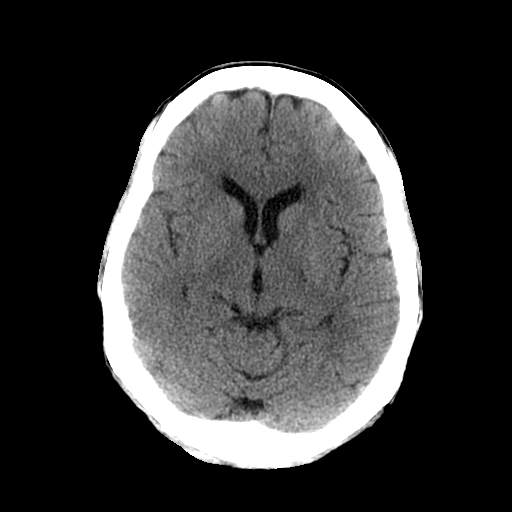
[im 12/32  bone]
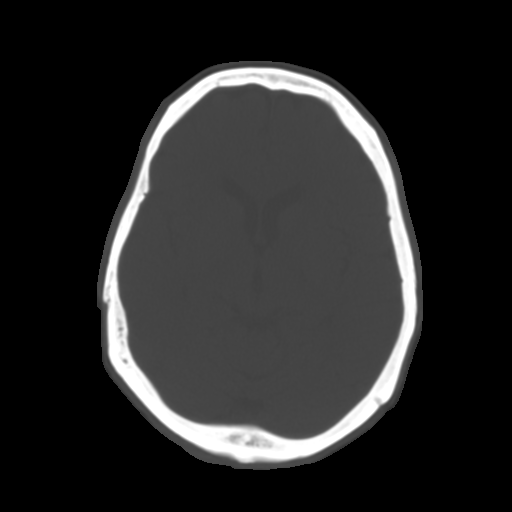
[im 14/32  brain]
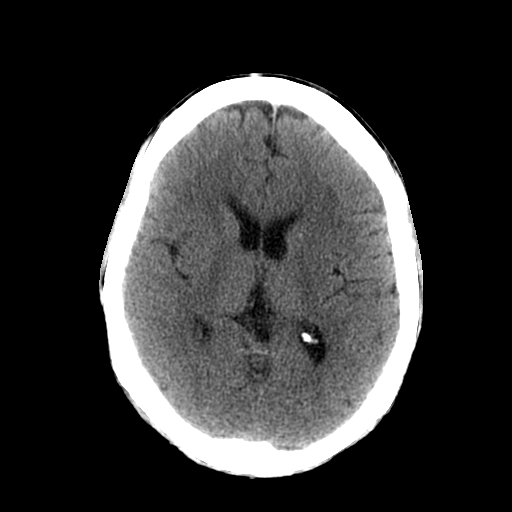
[im 16/32  brain]
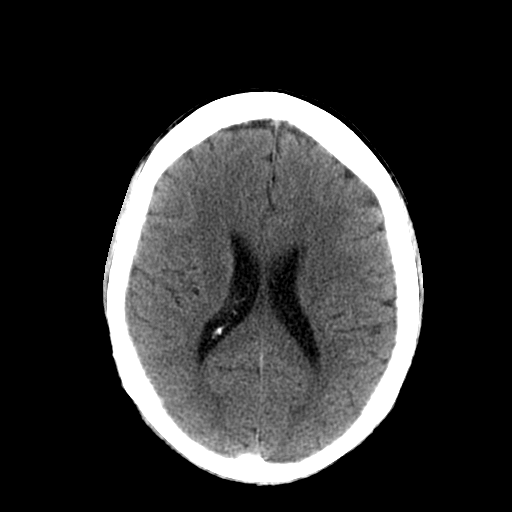
[im 18/32  brain]
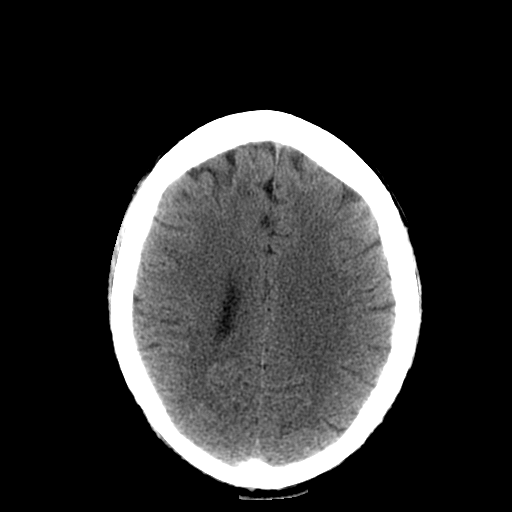
[im 20/32  brain]
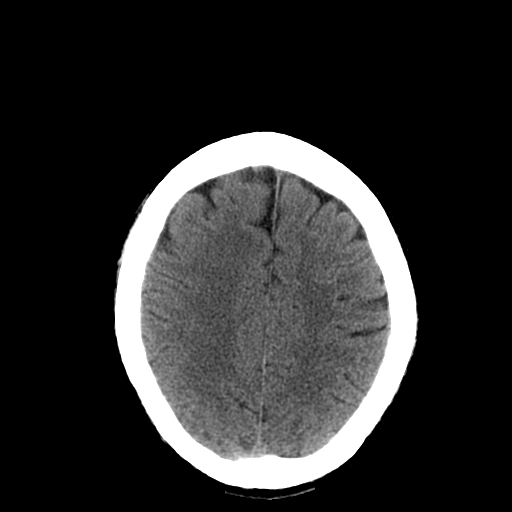
[im 20/32  bone]
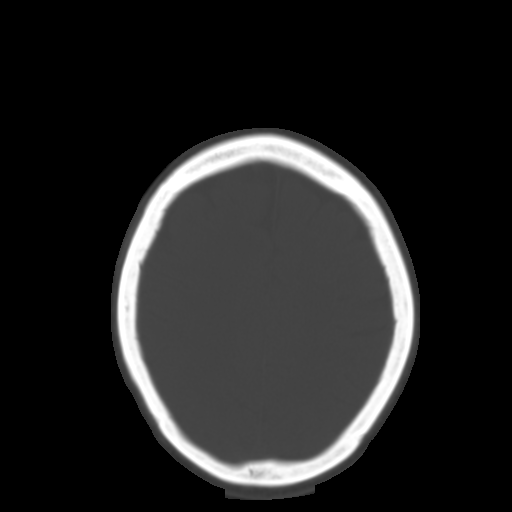
[im 23/32  brain]
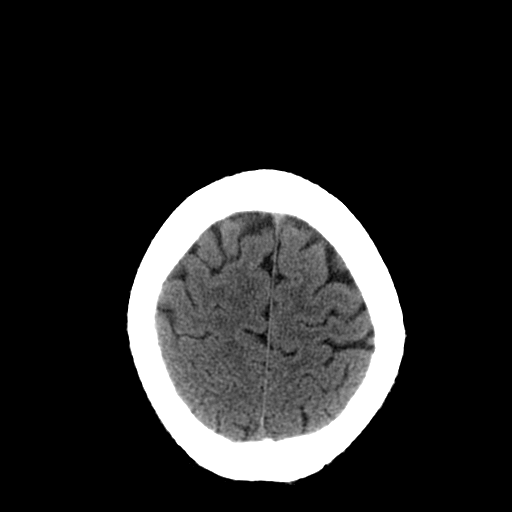
[im 25/32  brain]
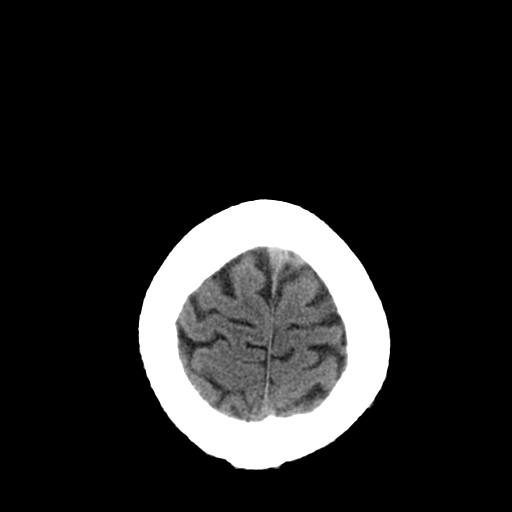
[im 27/32  brain]
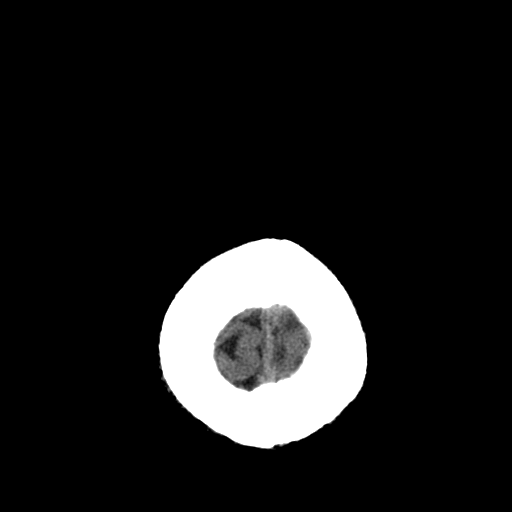
[im 29/32  brain]
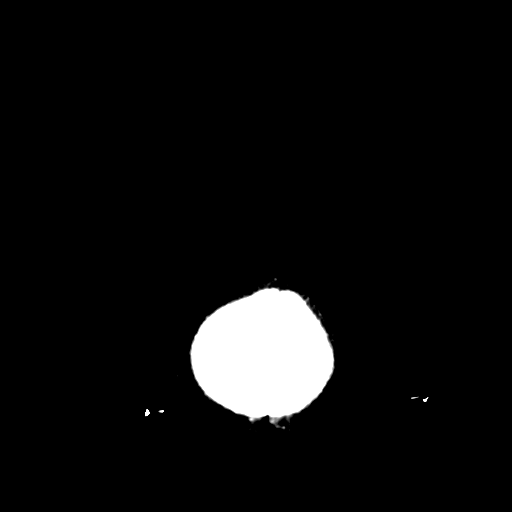
[im 29/32  bone]
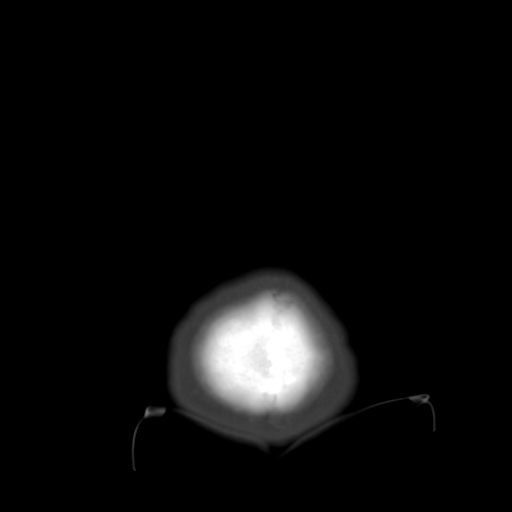

[13 of 30 positions shown; findings below may reference images not displayed]

FINDINGS: The brain has a normal appearance without evidence for
hemorrhage, infarction, hydrocephalus, or mass lesion.  There is no
extra axial fluid collection.  The skull and paranasal sinuses are
normal. :
IMPRESSION: 1.  No acute intracranial abnormalities.

## 2011-03-12 IMAGING — CT CT HEAD W/O CM
1 series · 16 of 30 positions shown, 20 images · non-contrast
Comparison: 02/09/2010

CLINICAL DATA: Syncope.

CT HEAD WITHOUT CONTRAST
TECHNIQUE: Contiguous axial images were obtained from the base of
the skull through the vertex without contrast.

[Series 2: head trauma 4.8 h37s · axial · 0.47mm/px · z∈[-148,+12]mm · 16 of 36 slices shown, 20 images]
[im 2/36  brain]
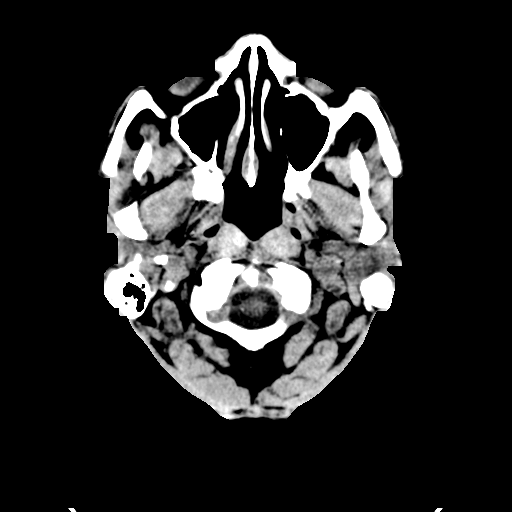
[im 2/36  bone]
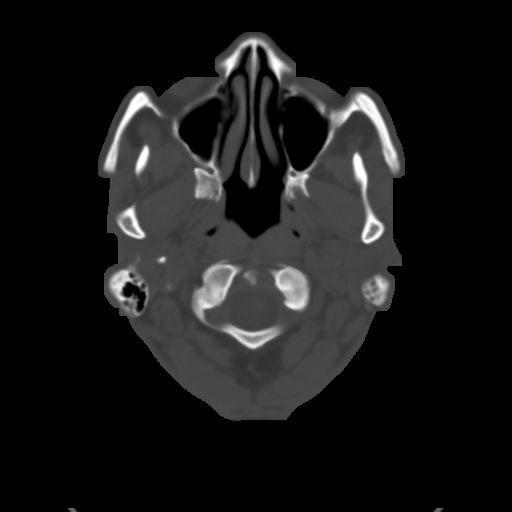
[im 4/36  brain]
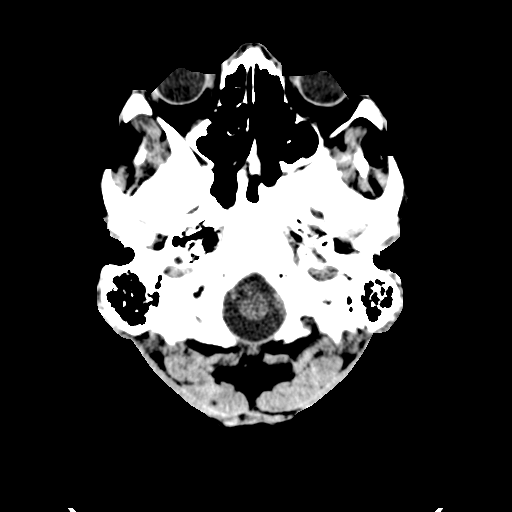
[im 7/36  brain]
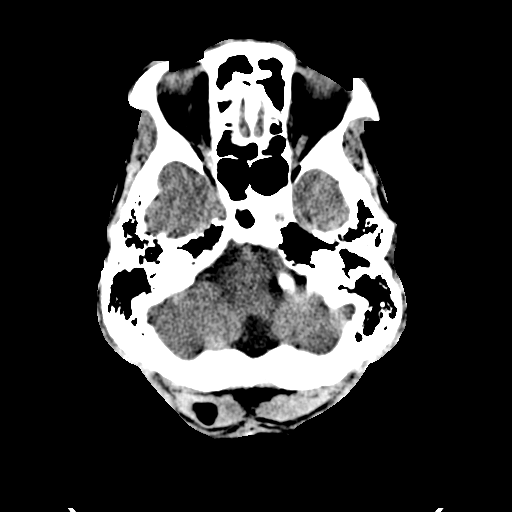
[im 9/36  brain]
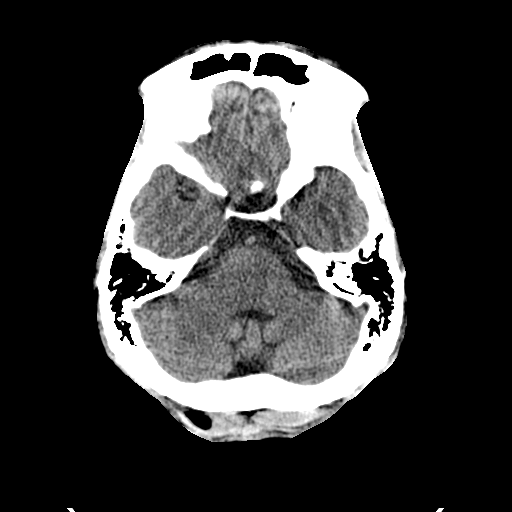
[im 10/36  brain]
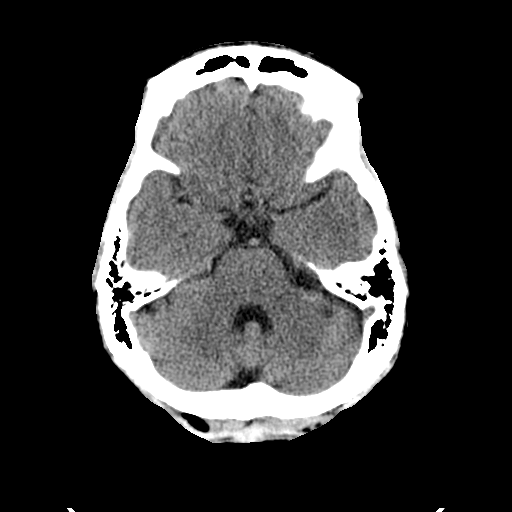
[im 10/36  bone]
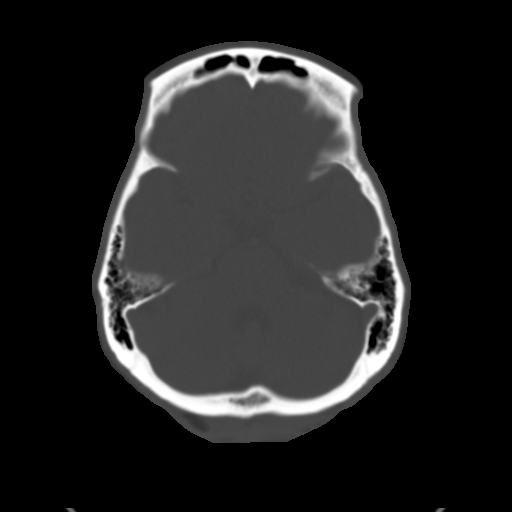
[im 13/36  brain]
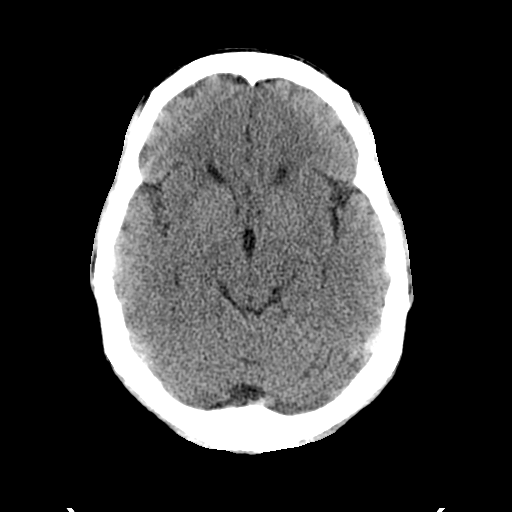
[im 15/36  brain]
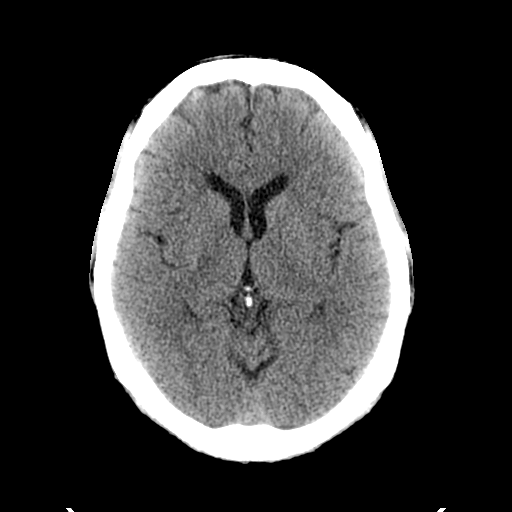
[im 17/36  brain]
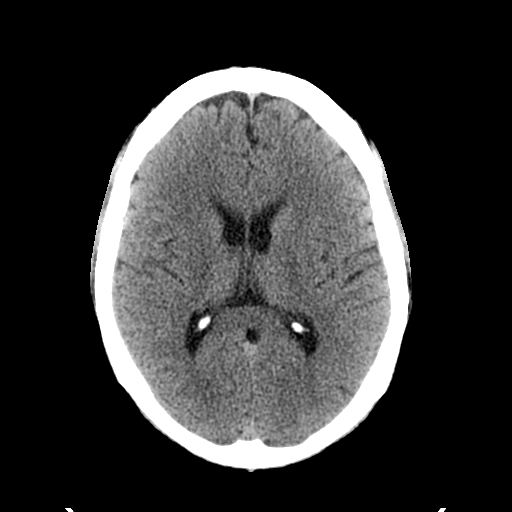
[im 19/36  brain]
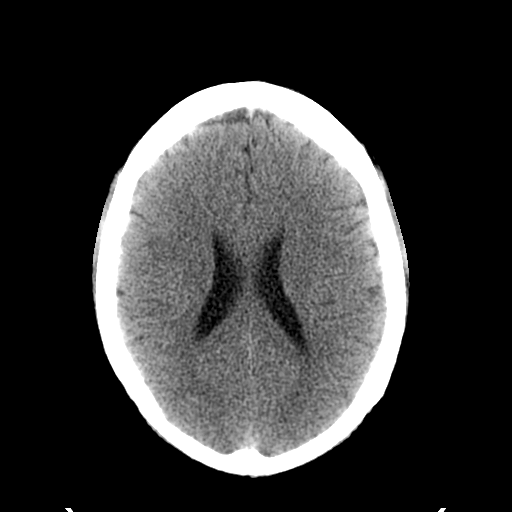
[im 19/36  bone]
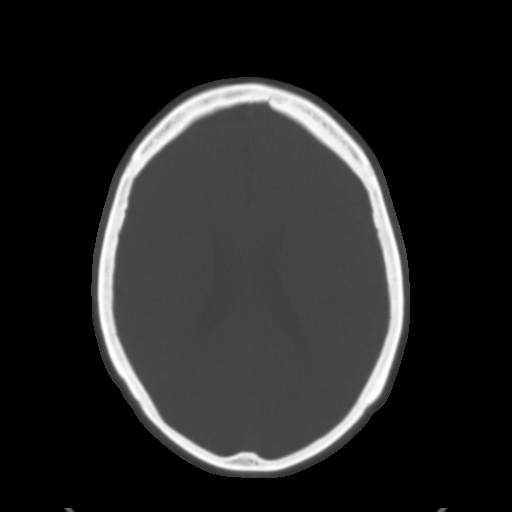
[im 21/36  brain]
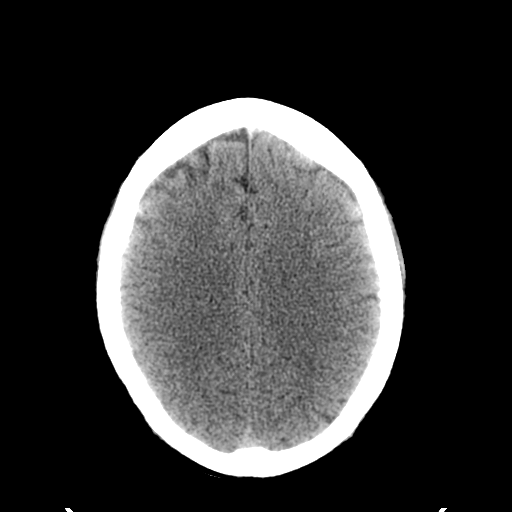
[im 23/36  brain]
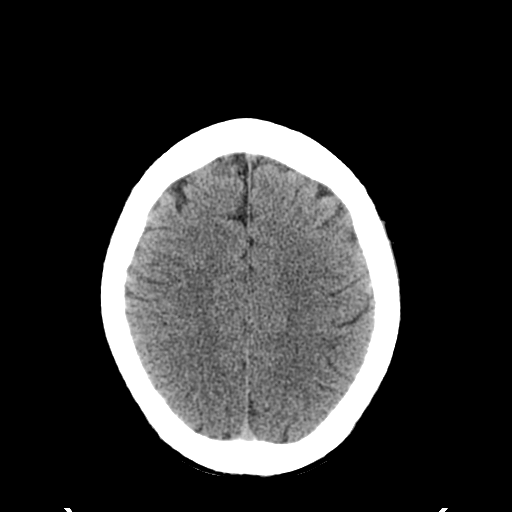
[im 26/36  brain]
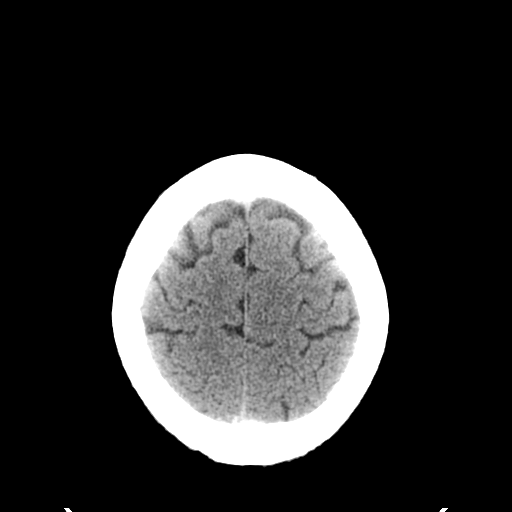
[im 27/36  brain]
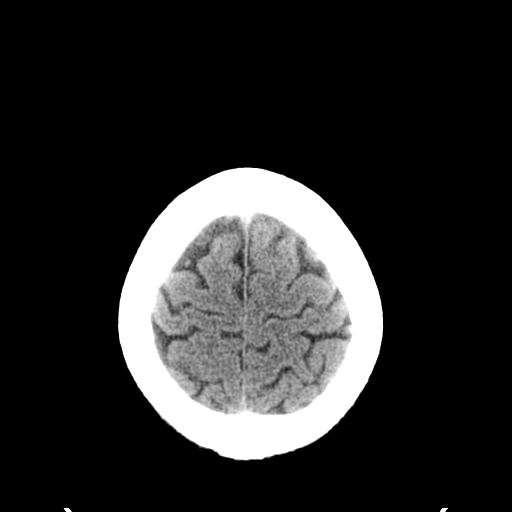
[im 27/36  bone]
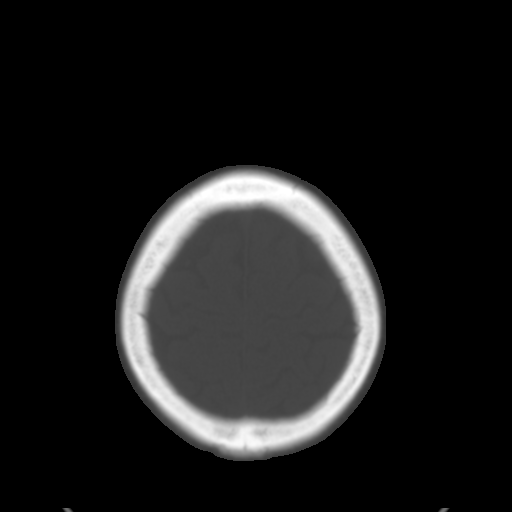
[im 29/36  brain]
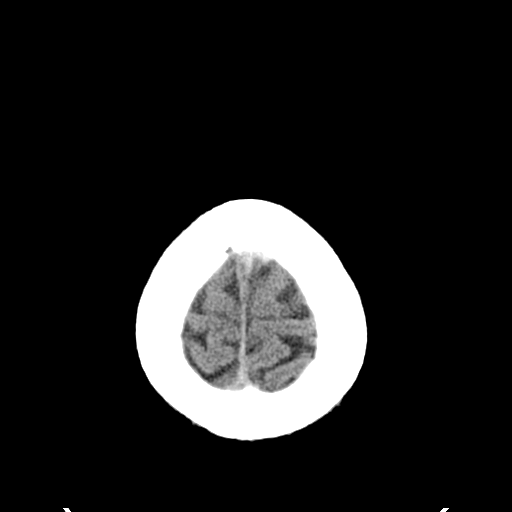
[im 32/36  brain]
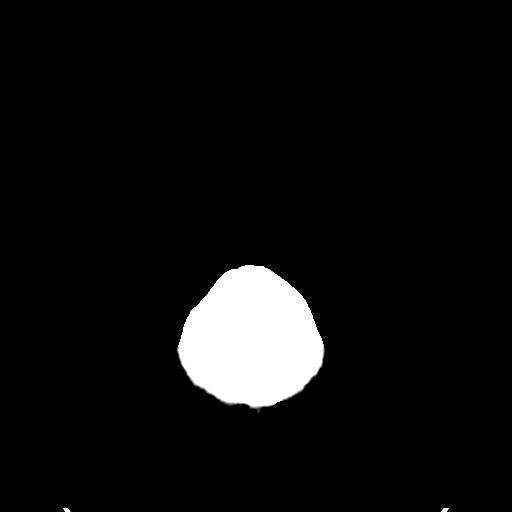
[im 34/36  brain]
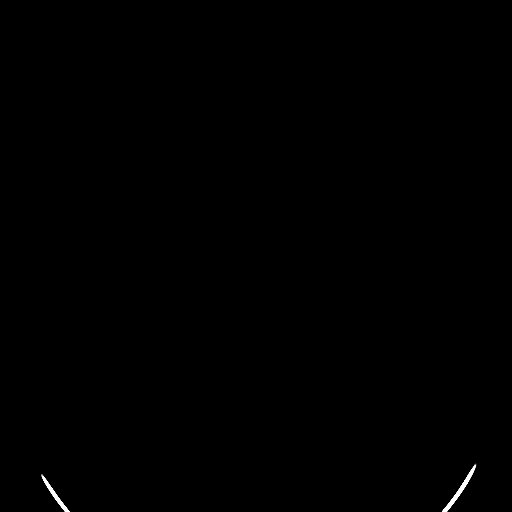

[16 of 30 positions shown; findings below may reference images not displayed]

FINDINGS: Stable head CT with no evidence of acute infarction,
hemorrhage, mass effect or extra-axial fluid collection.
Ventricles are stable and normal in size.  No evidence of mass
lesion.  The skull is normal.
IMPRESSION: Stable head CT.  No acute findings.

## 2011-03-12 IMAGING — CR DG CHEST 1V PORT
1 series · 1 of 1 positions shown · non-contrast
Comparison: 02/08/2010

CLINICAL DATA: Syncope, CHF, hypertension and DVT.

PORTABLE CHEST - 1 VIEW

[view not recorded]
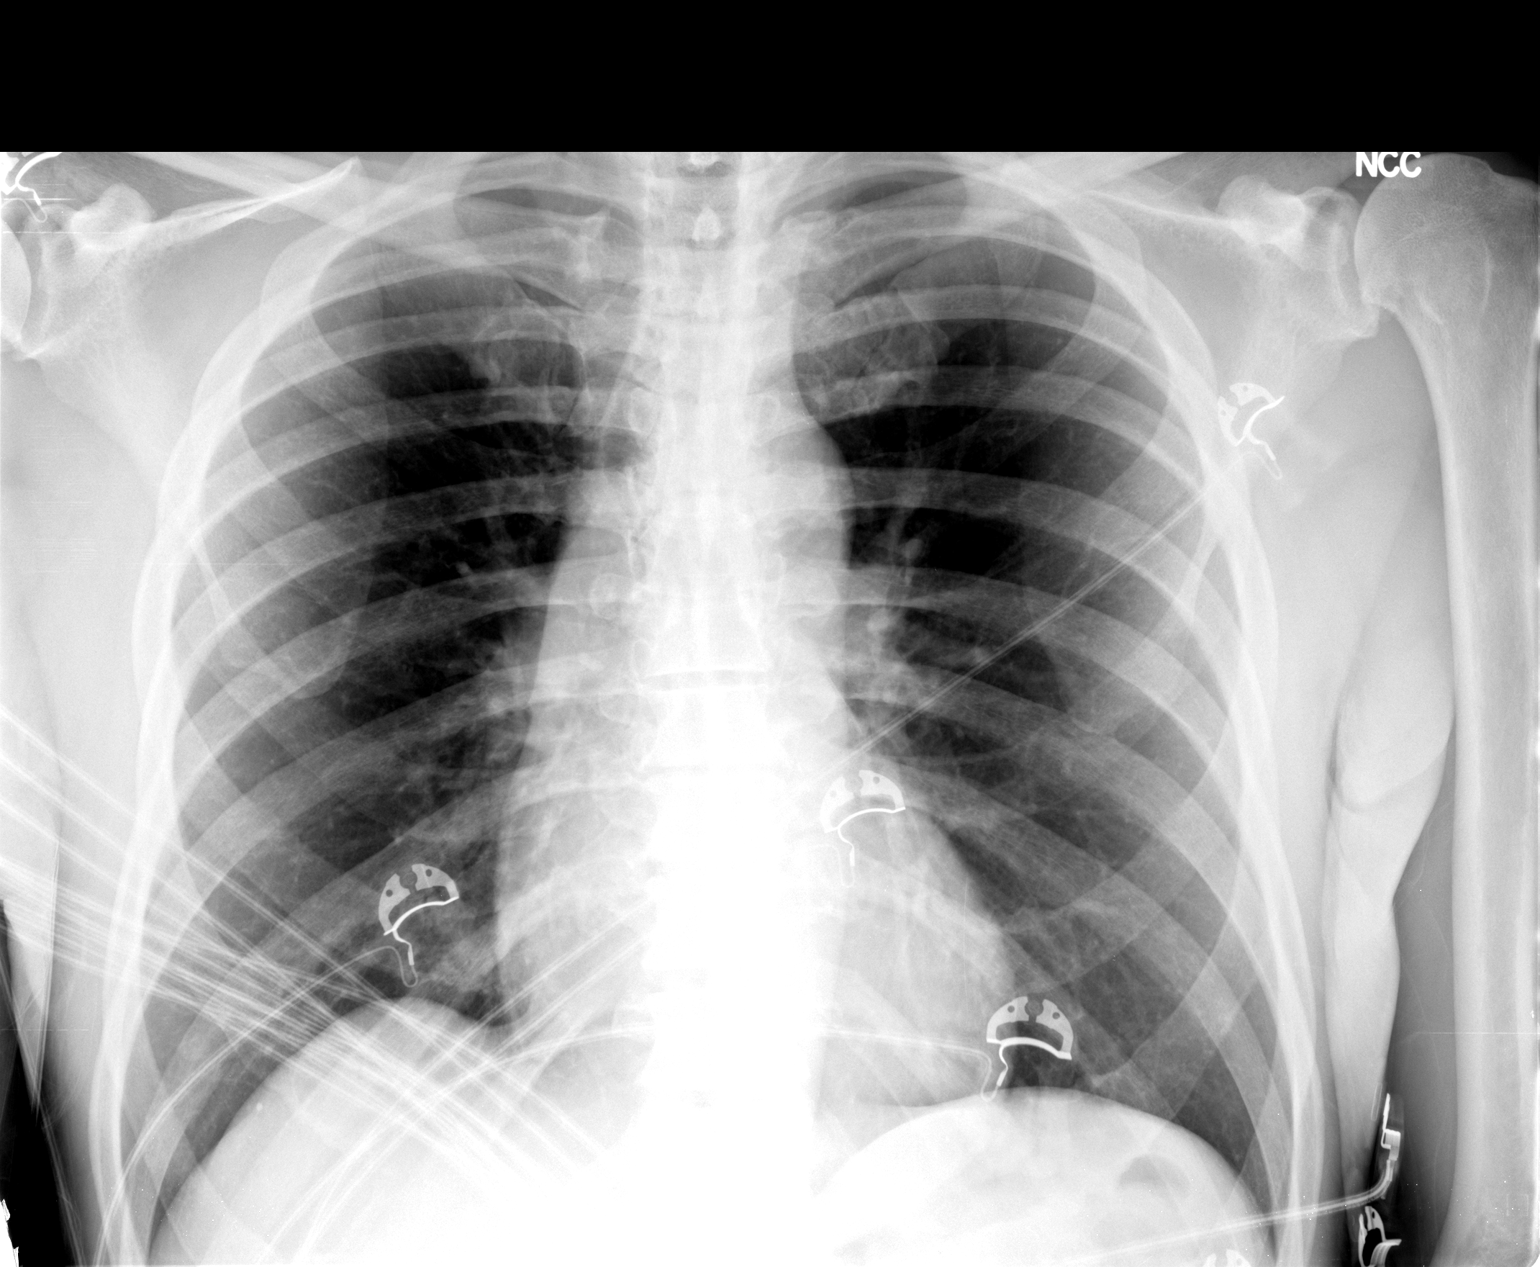

[1 of 1 positions shown; findings below may reference images not displayed]

FINDINGS: The lungs are clear bilaterally.  A nodular opacity is
seen in the right lung base likely related to nipple shadow.  No
confluent airspace opacities, pleural effuions or pneumothoracies
are seen.  The heart is normal in size and contour.  The upper
abdomen and osseous structures are normal.
IMPRESSION: No acute cardiopulmonary disease.

## 2011-05-31 ENCOUNTER — Other Ambulatory Visit: Payer: Self-pay | Admitting: Cardiology

## 2011-06-28 ENCOUNTER — Other Ambulatory Visit: Payer: Self-pay | Admitting: Internal Medicine

## 2011-08-29 ENCOUNTER — Other Ambulatory Visit: Payer: Self-pay | Admitting: Internal Medicine

## 2011-10-04 ENCOUNTER — Emergency Department (HOSPITAL_COMMUNITY): Payer: PRIVATE HEALTH INSURANCE

## 2011-10-04 ENCOUNTER — Encounter (HOSPITAL_COMMUNITY): Payer: Self-pay | Admitting: Emergency Medicine

## 2011-10-04 ENCOUNTER — Emergency Department (HOSPITAL_COMMUNITY)
Admission: EM | Admit: 2011-10-04 | Discharge: 2011-10-04 | Disposition: A | Discharge status: Home / Self Care | Payer: PRIVATE HEALTH INSURANCE | Attending: Emergency Medicine | Admitting: Emergency Medicine

## 2011-10-04 ENCOUNTER — Encounter: Payer: Self-pay | Admitting: *Deleted

## 2011-10-04 DIAGNOSIS — M109 Gout, unspecified: Secondary | ICD-10-CM

## 2011-10-04 DIAGNOSIS — F329 Major depressive disorder, single episode, unspecified: Secondary | ICD-10-CM | POA: Insufficient documentation

## 2011-10-04 DIAGNOSIS — Z86711 Personal history of pulmonary embolism: Secondary | ICD-10-CM | POA: Insufficient documentation

## 2011-10-04 DIAGNOSIS — Z87891 Personal history of nicotine dependence: Secondary | ICD-10-CM | POA: Insufficient documentation

## 2011-10-04 DIAGNOSIS — F3289 Other specified depressive episodes: Secondary | ICD-10-CM | POA: Insufficient documentation

## 2011-10-04 DIAGNOSIS — I10 Essential (primary) hypertension: Secondary | ICD-10-CM | POA: Insufficient documentation

## 2011-10-04 DIAGNOSIS — N400 Enlarged prostate without lower urinary tract symptoms: Secondary | ICD-10-CM | POA: Insufficient documentation

## 2011-10-04 DIAGNOSIS — E7800 Pure hypercholesterolemia, unspecified: Secondary | ICD-10-CM | POA: Insufficient documentation

## 2011-10-04 DIAGNOSIS — M1710 Unilateral primary osteoarthritis, unspecified knee: Secondary | ICD-10-CM | POA: Insufficient documentation

## 2011-10-04 MED ORDER — IBUPROFEN 600 MG PO TABS: 600.0000 mg | ORAL_TABLET | Freq: Three times a day (TID) | ORAL | Status: AC | PRN

## 2011-10-04 MED ORDER — IBUPROFEN 400 MG PO TABS
600.0000 mg | ORAL_TABLET | Freq: Once | ORAL | Status: AC
  Administered 2011-10-04: 600 mg via ORAL
  Filled 2011-10-04: qty 1

## 2011-10-04 NOTE — Progress Notes (Unsigned)
i have spoken to one of pt's 5 daughters and she is trying to locate him and have him come to ED, i will speak w/ her in appr to see if she has been able to locate him

## 2011-10-04 NOTE — ED Notes (Signed)
Patient currently sitting up in bed; no respiratory or acute distress noted.  Patient updated on plan of care; informed patient that we are currently waiting on further orders/disposition from EDP.  Patient has no other questions or concerns at this time; will continue to monitor. 

## 2011-10-04 NOTE — ED Provider Notes (Signed)
History     CSN: [id number]  Arrival date & time 10/04/11  1645   First MD Initiated Contact with Patient 10/04/11 2018      Chief Complaint  Patient presents with  . Foot Swelling    (Consider location/radiation/quality/duration/timing/severity/associated sxs/prior treatment) HPI Comments: Patient states he walked into a wall 5 days ago, hitting his left great, toe, which has been intermittently painful.  Most consistently for the past 3, days, with, redness.  He does have a history of gout in that toe.  Has not taken any over-the-counter medication.  Prior to arrival.  He did attempt to go to outpatient clinic today, but they were closing and told him to come to the emergency department for further evaluation.  He does have a followup appointment on Monday  The history is provided by the patient.    Past Medical History  Diagnosis Date  . Pulmonary embolism     with DVT in 8/08 pt continues to be on coumadin and this eepisode appears unprovoked  . HTN (hypertension)   . BPH (benign prostatic hyperplasia)   . History Of Hypercholesterolemia   . Other chest pain     Adenosine myoview in 2004 showing EF 52% with no evidence for ischemia.  There was a basal inferior fixed defect that my have been artifact.  . Hypercholesterolemia   . Diverticulitis     colonoscopy in 12/05  . Osteoarthritis     Primarily left knee, acute right knee swelling in 9/07  . Colitis     3-4 ulcers on colonoscopy in 12/05  . Wrist disorder     Proximal carpo-scapholunate dissosiation -seen by Dr. Lita Mains  . Foot pain, left     h/o  . Depression   . Gastritis   . Syncope     Suspect neurocardiogenic.  Always with standing or micturation.  Had bradycardic/hypotensive response with standing when hospitalized in 1/12.  On Florine[place].  Echo (1/12): EF 60-65%, mild aortic root dilation.     Past Surgical History  Procedure Date  . Transurethral resection of prostate 3/05    Vonita Moss    Family History   Problem Relation Age of Onset  . Heart disease Father   . Stroke Father   . Heart disease Brother 66    History  Substance Use Topics  . Smoking status: Former Games developer  . Smokeless tobacco: Not on file  . Alcohol Use: No      Review of Systems  Constitutional: Negative for fever.  Respiratory: Negative for shortness of breath.   Cardiovascular: Negative for chest pain.  Musculoskeletal: Positive for joint swelling.  Skin: Negative for wound.  Neurological: Negative for dizziness.    Allergies  Review of patient's allergies indicates no known allergies.  Home Medications   Current Outpatient Rx  Name Route Sig Dispense Refill  . ASPIRIN EC 81 MG PO TBEC Oral Take 81 mg by mouth every evening.    Marland Kitchen CITALOPRAM HYDROBROMIDE 20 MG PO TABS Oral Take 20 mg by mouth every evening.    Marland Kitchen FLUDROCORTISONE ACETATE 0.1 MG PO TABS Oral Take 0.05 mg by mouth every evening.    Marland Kitchen LISINOPRIL 20 MG PO TABS Oral Take 20 mg by mouth every evening.    Marland Kitchen MIRTAZAPINE 15 MG PO TABS Oral Take 15 mg by mouth at bedtime.    . IBUPROFEN 600 MG PO TABS Oral Take 1 tablet (600 mg total) by mouth every 8 (eight) hours as needed for pain. 30  tablet 0    BP 187/94  Pulse 70  Temp 98.3 F (36.8 C) (Oral)  Resp 16  SpO2 98%  Physical Exam  Constitutional: He appears well-developed and well-nourished.  HENT:  Head: Normocephalic.  Neck: Normal range of motion.  Cardiovascular: Normal rate.   Pulmonary/Chest: Effort normal.  Musculoskeletal: Normal range of motion.  Neurological: He is alert.  Skin: Skin is warm. There is erythema.    ED Course  Procedures (including critical care time)  Labs Reviewed - No data to display Dg Foot Complete Left  10/04/2011  *RADIOLOGY REPORT*  Clinical Data: Left foot pain and swelling.  LEFT FOOT - COMPLETE 3+ VIEW  Comparison: 10/07/2004  Findings: No evidence of acute fracture or dislocation.  Severe hallux valgus again demonstrated with bony and soft  tissue bunion formation.  Pes planus also noted, without change.  IMPRESSION:  1.  No acute findings. 2.  Stable hallux valgus with bunion.  Pes planus.   Original Report Authenticated By: Danae Orleans, M.D.      1. Gout       MDM   I believe this is to be gout x-ray was reviewed.  Its negative for any fractures, dislocations, or subluxations        Arman Filter, NP 10/04/11 2047

## 2011-10-04 NOTE — Progress Notes (Signed)
Called ED and spoke w/ chg nurse, gave update.

## 2011-10-04 NOTE — Progress Notes (Unsigned)
I agree.  It has been over a year since he was seen in the clinic.  We had made a conscious decision to not anticoagulate him because of his fall risk and the risk of intracranial hemorrhage.  If it is swollen he needs to be seen ASAP.

## 2011-10-04 NOTE — ED Notes (Signed)
Patient complaining of left foot swelling; patient states that he hit his left foot on a piece of furniture on Sunday.  Patient states that the swelling started Sunday night, decreased by Wednesday, and then increased again on Thursday.  Patient went to see Outpatient Clinics today, but was told they they could not see him until next week -- OC did not want him to wait until Monday to seek medical attention, so informed patient he should be seen in ED.  Patient reports pain in left foot; rates pain 7/10 on the numerical pain scale; describes pain as "intermittent" and "burning".  Patient denies needs for assistance with walking (such as cane or walker) on a regular basis.  Patient alert and oriented x4; PERRL present.  Will continue to monitor.

## 2011-10-04 NOTE — Progress Notes (Unsigned)
i have now spoken w/ daughter once again and pt is on the way back to [place].

## 2011-10-04 NOTE — Progress Notes (Signed)
Myriam Jacobson, I reviewed his records and he has a hx of PE. I do not see him currently on anticoagulation. He may have a DVT, I would send him to the ED for evaluation if we have no appointment, since it is getting more swollen and painful. Thanks.

## 2011-10-04 NOTE — ED Notes (Signed)
Patient given discharge paperwork; went over discharge instructions with patient.  Patient instructed to take iburprofen as directed, to follow up with primary care physician (to keep scheduled appointment on Monday), and to return to the ED for new, worsening, or concerning symptoms.

## 2011-10-04 NOTE — Progress Notes (Unsigned)
Pt presents c/o swelling of L foot for appr 1 week, starting sat or sun, progressively more swelling and becoming painful. Today he presents and foot is swollen, denies pain in leg, shortness of breath, temp of foot is no different from R. No appt available today, appt given for 9/9 at 0815 dr Tonny Branch, pt and daughter are acceptable. He is cautioned to go to ED or urg care if foot becomes worse or any above or loss of sensation or temp in foot.

## 2011-10-04 NOTE — ED Notes (Addendum)
C/o L foot swelling since Sunday.  States he stumbled over something on Sunday and swelling started 3 hours later. Pain started yesterday. Denies sob or any other symptoms.

## 2011-10-04 NOTE — Progress Notes (Signed)
[name], I reviewed his records and he has a hx of PE. I do not see him currently on anticoagulation. He may have a DVT, I would send him to the ED for evaluation if we have no appointment, since it is getting more swollen and painful. Thanks. 

## 2011-10-07 ENCOUNTER — Encounter: Payer: Self-pay | Admitting: Internal Medicine

## 2011-10-07 ENCOUNTER — Ambulatory Visit (INDEPENDENT_AMBULATORY_CARE_PROVIDER_SITE_OTHER): Payer: PRIVATE HEALTH INSURANCE | Admitting: Internal Medicine

## 2011-10-07 VITALS — BP 181/91 | HR 62 | Temp 97.1°F | Ht 71.0 in | Wt 220.4 lb

## 2011-10-07 DIAGNOSIS — I10 Essential (primary) hypertension: Secondary | ICD-10-CM

## 2011-10-07 DIAGNOSIS — Z8669 Personal history of other diseases of the nervous system and sense organs: Secondary | ICD-10-CM

## 2011-10-07 DIAGNOSIS — M7989 Other specified soft tissue disorders: Secondary | ICD-10-CM
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
## 2011-10-07 NOTE — Patient Instructions (Addendum)
1.  Stop the Florinef  2.  Use the Ibuprofen 600 mg TID with meals as needed for the pain in your left foot.  3.  Follow up with me in 1 week to recheck your blood pressure.

## 2011-10-07 NOTE — Progress Notes (Signed)
Thanks

## 2011-10-14 ENCOUNTER — Ambulatory Visit (INDEPENDENT_AMBULATORY_CARE_PROVIDER_SITE_OTHER): Payer: PRIVATE HEALTH INSURANCE | Admitting: Internal Medicine

## 2011-10-14 ENCOUNTER — Encounter: Payer: Self-pay | Admitting: Internal Medicine

## 2011-10-14 VITALS — BP 160/80 | HR 67 | Temp 98.0°F | Ht 71.0 in | Wt 220.0 lb

## 2011-10-14 DIAGNOSIS — Z8669 Personal history of other diseases of the nervous system and sense organs: Secondary | ICD-10-CM

## 2011-10-14 DIAGNOSIS — M7989 Other specified soft tissue disorders: Secondary | ICD-10-CM

## 2011-10-14 DIAGNOSIS — I10 Essential (primary) hypertension: Secondary | ICD-10-CM
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
## 2011-10-14 NOTE — Patient Instructions (Addendum)
1.  Continue your medications as prescribed.   2.  Keep an eye on your blood pressure.  When [employer] comes to your house ask for a blood pressure cuff.  If they need anything from me let me know.  3.  Follow up with me in November to recheck your blood pressure.  If you need [place] before then please give [place] a call.

## 2011-11-27 ENCOUNTER — Other Ambulatory Visit: Payer: Self-pay | Admitting: Cardiology

## 2011-12-20 ENCOUNTER — Encounter: Payer: PRIVATE HEALTH INSURANCE | Admitting: Internal Medicine

## 2012-01-01 ENCOUNTER — Other Ambulatory Visit: Payer: Self-pay | Admitting: Cardiology

## 2012-01-10 ENCOUNTER — Encounter: Payer: Self-pay | Admitting: Internal Medicine

## 2012-01-10 ENCOUNTER — Ambulatory Visit (INDEPENDENT_AMBULATORY_CARE_PROVIDER_SITE_OTHER): Payer: PRIVATE HEALTH INSURANCE | Admitting: Internal Medicine

## 2012-01-10 VITALS — BP 157/92 | HR 62 | Temp 97.1°F | Ht 71.0 in | Wt 227.9 lb

## 2012-01-10 DIAGNOSIS — Z299 Encounter for prophylactic measures, unspecified: Secondary | ICD-10-CM

## 2012-01-10 DIAGNOSIS — I77810 Thoracic aortic ectasia: Secondary | ICD-10-CM | POA: Insufficient documentation

## 2012-01-10 DIAGNOSIS — I10 Essential (primary) hypertension: Secondary | ICD-10-CM

## 2012-01-10 DIAGNOSIS — Z23 Encounter for immunization: Secondary | ICD-10-CM

## 2012-01-10 DIAGNOSIS — I712 Thoracic aortic aneurysm, without rupture: Secondary | ICD-10-CM | POA: Insufficient documentation

## 2012-01-10 DIAGNOSIS — F329 Major depressive disorder, single episode, unspecified: Secondary | ICD-10-CM

## 2012-01-10 HISTORY — DX: Thoracic aortic ectasia: I77.810

## 2012-01-10 LAB — BASIC METABOLIC PANEL
BUN: 14 mg/dL (ref 6–23)
CO2: 27 mEq/L (ref 19–32)
Calcium: 9.4 mg/dL (ref 8.4–10.5)
Chloride: 104 mEq/L (ref 96–112)
Creat: 1.06 mg/dL (ref 0.50–1.35)
Glucose, Bld: 82 mg/dL (ref 70–99)

## 2012-01-10 MED ORDER — CITALOPRAM HYDROBROMIDE 20 MG PO TABS: ORAL_TABLET | ORAL | Status: AC

## 2012-01-10 MED ORDER — LISINOPRIL 20 MG PO TABS: 20.0000 mg | ORAL_TABLET | Freq: Every day | ORAL | Status: DC

## 2012-01-10 MED ORDER — MIRTAZAPINE 15 MG PO TABS: 15.0000 mg | ORAL_TABLET | Freq: Every day | ORAL | Status: DC

## 2012-01-10 NOTE — Patient Instructions (Addendum)
1.  Stop in the lab to have your blood checked.  I will call you if we need to talk about anything.  2.  Decrease your citalopram.  Cut the pills in half for 2 weeks then stop the medication.  - Keep an eye out for worsening depression: problems sleeping, increased irritability, increased sadness or down mood, inability to do the things you want to or need to do, or thoughts of hurting yourself or others.  -  If you notice any of these please call me right away.  3.  Continue your other medications as prescribed.    4.  Follow up with me in February.
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
## 2012-01-20 NOTE — Assessment & Plan Note (Signed)
He has a history of syncope without low blood pressures.  This was thought to be neurocardiogenic and was successfully treated with Florinef and no recurrence of symptoms.  He is now having problems with hypertension.  We will stop the florinef today and monitor for recurrence of symptoms.

## 2012-01-20 NOTE — Assessment & Plan Note (Signed)
Pain is much improved and he has stopped taking the ibuprofen.  He has not had problems with his stomach while taking the medication.

## 2012-01-20 NOTE — Assessment & Plan Note (Signed)
Based on uptodate recommendations he should undergo a repeat echocardiogram ~yearly as long as he has no progression of symptoms of aortic regurgitation.  We will work to get him scheduled for a repeat echo after the first of the year and see him back in February to discuss the results.

## 2012-01-20 NOTE — Assessment & Plan Note (Signed)
No recurrence of symptoms with discontinuation of the florinef. We will continue to monitor.

## 2012-01-20 NOTE — Assessment & Plan Note (Signed)
BP Readings from Last 4 Encounters:  10/07/11 181/91  10/04/11 187/94  09/14/10 137/79  05/25/10 159/78   His BP today is elevated.  We will have him stop the florinef now that he is not having the recurrent syncope events and have him return in two weeks for follow up and recheck his BP.

## 2012-01-20 NOTE — Assessment & Plan Note (Addendum)
He appears to have a mild strain of the MTP on the left great toe likely from when he stumbled.  We will treat conservatively with ice, rest, and tylenol or ibuprofen for pain relief.  He was cautioned to take the ibuprofen with food three times daily.

## 2012-01-20 NOTE — Assessment & Plan Note (Signed)
He has been maintained on Celexa and Remeron for over a year and a half for his depression. He would like to try to come off the medication to see how he does.  We discussed the episodic nature of depression and we will work to taper him off the celexa.  He was cautioned to keep an eye out for recurrent depression symptoms including sleep problems, problems with concentration, worsening mood, increased irritability, suicidal or homicidal thoughts.  He was cautioned to call the office or seek help if he notices these signs.  We will continue the Remeron for sleep at this time and continue to follow up with him in about 1-2 months to see how he is doing.

## 2012-01-20 NOTE — Assessment & Plan Note (Addendum)
Lab Results  Component Value Date   NA 139 01/10/2012   K 4.3 01/10/2012   CL 104 01/10/2012   CO2 27 01/10/2012   BUN 14 01/10/2012   CREATININE 1.06 01/10/2012   CREATININE 0.98 09/04/2010    BP Readings from Last 3 Encounters:  01/10/12 157/92  10/14/11 160/80  10/07/11 181/91    Assessment: Hypertension control:  mildly elevated  Progress toward goals:  improved Barriers to meeting goals:  no barriers identified  Plan: Hypertension treatment:  He is due for a recheck of his Bmet with his lisinopril use.  He is very close to his goal and had been out of his medication for about 3 days.  We will encourage him to take it daily and recheck it at his next follow up.  If he continues to be mildly elevated we will add 12.5 mg of HCTZ or chlorthalidone.  He will need to be watched closely because of his history of orthostasis and syncope.

## 2012-01-20 NOTE — Assessment & Plan Note (Signed)
BP Readings from Last 5 Encounters:  10/14/11 160/80  10/07/11 181/91  10/04/11 187/94  09/14/10 137/79  05/25/10 159/78   Blood pressure is better today but still not at his goal of <140/90.  He has only been off the medication for about 5 days so we will recheck him at his next follow up appointment and adjust his medications if needed.  We need to be careful pushing his blood pressure too low with his syncopal history.

## 2012-03-13 ENCOUNTER — Encounter: Payer: PRIVATE HEALTH INSURANCE | Admitting: Internal Medicine

## 2012-03-31 ENCOUNTER — Other Ambulatory Visit: Payer: Self-pay | Admitting: Internal Medicine

## 2012-03-31 NOTE — Telephone Encounter (Signed)
At his last appointment we had worked to taper him off the medication.  I called [name] and he states that he is doing well from a mood stand point after tapering down the medication.  We will discuss this at his follow up appointment Friday and for now I will refuse the refill.

## 2012-04-03 ENCOUNTER — Encounter: Payer: PRIVATE HEALTH INSURANCE | Admitting: Internal Medicine

## 2012-04-05 ENCOUNTER — Emergency Department (HOSPITAL_COMMUNITY): Payer: PRIVATE HEALTH INSURANCE

## 2012-04-05 ENCOUNTER — Encounter (HOSPITAL_COMMUNITY): Payer: Self-pay | Admitting: Emergency Medicine

## 2012-04-05 ENCOUNTER — Emergency Department (HOSPITAL_COMMUNITY)
Admission: EM | Admit: 2012-04-05 | Discharge: 2012-04-05 | Disposition: A | Discharge status: Home / Self Care | Payer: PRIVATE HEALTH INSURANCE | Attending: Emergency Medicine | Admitting: Emergency Medicine

## 2012-04-05 DIAGNOSIS — Z8739 Personal history of other diseases of the musculoskeletal system and connective tissue: Secondary | ICD-10-CM | POA: Insufficient documentation

## 2012-04-05 DIAGNOSIS — J209 Acute bronchitis, unspecified: Secondary | ICD-10-CM | POA: Insufficient documentation

## 2012-04-05 DIAGNOSIS — Z8659 Personal history of other mental and behavioral disorders: Secondary | ICD-10-CM | POA: Insufficient documentation

## 2012-04-05 DIAGNOSIS — R52 Pain, unspecified: Secondary | ICD-10-CM | POA: Insufficient documentation

## 2012-04-05 DIAGNOSIS — M1990 Unspecified osteoarthritis, unspecified site: Secondary | ICD-10-CM | POA: Insufficient documentation

## 2012-04-05 DIAGNOSIS — Z8639 Personal history of other endocrine, nutritional and metabolic disease: Secondary | ICD-10-CM | POA: Insufficient documentation

## 2012-04-05 DIAGNOSIS — Z8719 Personal history of other diseases of the digestive system: Secondary | ICD-10-CM | POA: Insufficient documentation

## 2012-04-05 DIAGNOSIS — Z87891 Personal history of nicotine dependence: Secondary | ICD-10-CM | POA: Insufficient documentation

## 2012-04-05 DIAGNOSIS — Z862 Personal history of diseases of the blood and blood-forming organs and certain disorders involving the immune mechanism: Secondary | ICD-10-CM | POA: Insufficient documentation

## 2012-04-05 DIAGNOSIS — I10 Essential (primary) hypertension: Secondary | ICD-10-CM | POA: Insufficient documentation

## 2012-04-05 DIAGNOSIS — Z7982 Long term (current) use of aspirin: Secondary | ICD-10-CM | POA: Insufficient documentation

## 2012-04-05 DIAGNOSIS — Z79899 Other long term (current) drug therapy: Secondary | ICD-10-CM | POA: Insufficient documentation

## 2012-04-05 DIAGNOSIS — Z87448 Personal history of other diseases of urinary system: Secondary | ICD-10-CM | POA: Insufficient documentation

## 2012-04-05 DIAGNOSIS — J40 Bronchitis, not specified as acute or chronic: Secondary | ICD-10-CM

## 2012-04-05 DIAGNOSIS — Z8679 Personal history of other diseases of the circulatory system: Secondary | ICD-10-CM | POA: Insufficient documentation

## 2012-04-05 DIAGNOSIS — Z86711 Personal history of pulmonary embolism: Secondary | ICD-10-CM | POA: Insufficient documentation

## 2012-04-05 MED ORDER — AEROCHAMBER Z-STAT PLUS/MEDIUM MISC
1.0000 | Freq: Once | Status: AC
  Administered 2012-04-05: 1
  Filled 2012-04-05: qty 1

## 2012-04-05 MED ORDER — CEPHALEXIN 500 MG PO CAPS: 500.0000 mg | ORAL_CAPSULE | Freq: Four times a day (QID) | ORAL | Status: DC

## 2012-04-05 MED ORDER — ALBUTEROL SULFATE HFA 108 (90 BASE) MCG/ACT IN AERS
2.0000 | INHALATION_SPRAY | RESPIRATORY_TRACT | Status: DC | PRN
  Administered 2012-04-05: 2 via RESPIRATORY_TRACT
  Filled 2012-04-05: qty 6.7

## 2012-04-05 NOTE — ED Notes (Signed)
Pt c/o cough x several days with pain with cough and body aches; pt sts chills also

## 2012-04-05 NOTE — ED Provider Notes (Signed)
History     CSN: [id number]  Arrival date & time 04/05/12  1302   First MD Initiated Contact with Patient 04/05/12 1519      Chief Complaint  Patient presents with  . URI  . Generalized Body Aches  . Cough    (Consider location/radiation/quality/duration/timing/severity/associated sxs/prior treatment) HPI Comments: [name] is a 72 y.o. male who has had a persistent cough for 5 days. He occasional produces sputum with the cough, but usually not to. He has had fever, and chills. He denies nausea, vomiting, weakness, or dizziness. He is not a smoker. He does not have chronic bronchitis. He has no other complaints. There are no known modifying factors.  Patient is a 72 y.o. male presenting with URI and cough. The history is provided by the patient.  URI Presenting symptoms: cough   Cough   Past Medical History  Diagnosis Date  . Pulmonary embolism     with DVT in 8/08 pt continues to be on coumadin and this eepisode appears unprovoked  . HTN (hypertension)   . BPH (benign prostatic hyperplasia)   . History of hypercholesterolemia   . Other chest pain     Adenosine myoview in 2004 showing EF 52% with no evidence for ischemia.  There was a basal inferior fixed defect that my have been artifact.  . Hypercholesterolemia   . Diverticulitis     colonoscopy in 12/05  . Osteoarthritis     Primarily left knee, acute right knee swelling in 9/07  . Colitis     3-4 ulcers on colonoscopy in 12/05  . Wrist disorder     Proximal carpo-scapholunate dissosiation -seen by Dr. Lita Mains  . Foot pain, left     h/o  . Depression   . Gastritis   . Syncope     Suspect neurocardiogenic.  Always with standing or micturation.  Had bradycardic/hypotensive response with standing when hospitalized in 1/12.  On Florinef.  Echo (1/12): EF 60-65%, mild aortic root dilation.   . Aortic root dilatation 2012    Seen on Echo in 2012.  39mm    Past Surgical History  Procedure Laterality Date  .  Transurethral resection of prostate  3/05    Vonita Moss    Family History  Problem Relation Age of Onset  . Heart disease Father   . Stroke Father   . Heart disease Brother 76    History  Substance Use Topics  . Smoking status: Former Games developer  . Smokeless tobacco: Not on file  . Alcohol Use: No      Review of Systems  Respiratory: Positive for cough.   All other systems reviewed and are negative.    Allergies  Review of patient's allergies indicates no known allergies.  Home Medications   Current Outpatient Rx  Name  Route  Sig  Dispense  Refill  . aspirin EC 81 MG tablet   Oral   Take 81 mg by mouth every evening.         Marland Kitchen lisinopril (PRINIVIL,ZESTRIL) 20 MG tablet   Oral   Take 1 tablet (20 mg total) by mouth daily.   30 tablet   6   . Pseudoeph-Doxylamine-DM-APAP (NYQUIL PO)   Oral   Take 30 mLs by mouth at bedtime as needed (for cough).         Marland Kitchen albuterol (PROVENTIL HFA;VENTOLIN HFA) 108 (90 BASE) MCG/ACT inhaler   Inhalation   Inhale 2 puffs into the lungs every 6 (six) hours as needed  for wheezing.         . cephALEXin (KEFLEX) 500 MG capsule   Oral   Take 1 capsule (500 mg total) by mouth 4 (four) times daily.   28 capsule   0     BP 124/79  Pulse 67  Temp(Src) 98.3 F (36.8 C) (Oral)  Resp 16  SpO2 100%  Physical Exam  Nursing note and vitals reviewed. Constitutional: He is oriented to person, place, and time. He appears well-developed and well-nourished.  HENT:  Head: Normocephalic and atraumatic.  Right Ear: External ear normal.  Left Ear: External ear normal.  Eyes: Conjunctivae and EOM are normal. Pupils are equal, round, and reactive to light.  Neck: Normal range of motion and phonation normal. Neck supple.  Cardiovascular: Normal rate, regular rhythm, normal heart sounds and intact distal pulses.   Pulmonary/Chest: Effort normal. No respiratory distress. He has no wheezes. He has no rales. He exhibits no tenderness and no  bony tenderness.  Somewhat diminished expiratory air movement  Abdominal: Soft. Normal appearance. There is no tenderness.  Musculoskeletal: Normal range of motion.  Neurological: He is alert and oriented to person, place, and time. He has normal strength. No cranial nerve deficit or sensory deficit. He exhibits normal muscle tone. Coordination normal.  Skin: Skin is warm, dry and intact.  Psychiatric: He has a normal mood and affect. His behavior is normal. Judgment and thought content normal.    ED Course  Procedures (including critical care time)  ED Treatment: Albuterol inhaler and spacer     Labs Reviewed - No data to display Ct Abdomen Pelvis W Contrast  04/08/2012  **ADDENDUM** CREATED: 04/08/2012 22:05:26  Discussed the case with the ordering physician, Dr. Bernette Mayers. The history provided by the technologist of prostate cancer may be inaccurate, as there is no documentation of this elsewhere. There is a history of BPH with prior TURP, which is also compatible with the imaging findings.  **END ADDENDUM** SIGNED BY: Waneta Martins, M.D.   04/08/2012  *RADIOLOGY REPORT*  Clinical Data: Hematuria, prostate cancer.  CT ABDOMEN AND PELVIS WITH CONTRAST  Technique:  Multidetector CT imaging of the abdomen and pelvis was performed following the standard protocol during bolus administration of intravenous contrast.  Contrast: OMNIPAQUE IOHEXOL 300 MG/ML  SOLN  Comparison: 08/31/2010  Findings: The lung bases are clear.  Heart size within normal limits.  Punctate calcifications within the liver, in keeping with sequelae of prior granulomas infection.  Unremarkable spleen, pancreas, biliary system, adrenal glands.  There are bilateral renal cysts and too small to further characterize hypodensities.  No hydronephrosis or hydroureter. Bifid left renal pelvis.  Colonic diverticulosis and mild pericolonic fat stranding along a segment of descending colon which may reflect sequelae of prior  inflammation or a mild acute diverticulitis in the appropriate clinical setting.  Normal appendix.  Horizontal segment of the duodenum shows a diverticulum arising along the superior margin.  Thick-walled segment of ileum in the right lower quadrant (series 2 image 67).  No free intraperitoneal air or fluid.  No lymphadenopathy.  Normal caliber aorta and branch vessels.  Partially decompressed bladder without intraluminal filling defect. Heterogeneous enhancement of a large and lobular prostate gland is nonspecific.  Fat containing inguinal hernias.  Multilevel degenerative changes without acute osseous finding.  IMPRESSION: Colon diverticulosis.  Mild pericolonic fat stranding along a segment of descending colon may reflect sequelae of prior inflammation/diverticulitis.  Correlate clinically if concern for mild / acute inflammation.  There is  a thick-walled short segment of proximal ileum in the right lower quadrant.  This may be a pseudo lesion due to peristalsis, sequelae of prior inflammation, or a mass. Consider GI follow up and capsule endoscopy to further evaluate.  Enlarged/lobular prostate gland. Per the provided history, the patient has known prostate cancer.  Original Report Authenticated By: Jearld Lesch, M.D.    Nursing notes, applicable records and vitals reviewed.  Radiologic Images/Reports reviewed.   1. Bronchitis       MDM  Evaluation is consistent with acute bronchitis. Doubt metabolic instability, serious bacterial infection or impending vascular collapse; the patient is stable for discharge.   Plan: Home Medications- inhaler, Keflex; Home Treatments- reat; Recommended follow up- PCP prn       Flint Melter, MD 04/09/12 [phone]

## 2012-04-05 NOTE — ED Notes (Signed)
MD at bedside. 

## 2012-04-08 ENCOUNTER — Encounter (HOSPITAL_COMMUNITY): Payer: Self-pay | Admitting: *Deleted

## 2012-04-08 ENCOUNTER — Emergency Department (HOSPITAL_COMMUNITY)
Admission: EM | Admit: 2012-04-08 | Discharge: 2012-04-08 | Disposition: A | Discharge status: Home / Self Care | Payer: PRIVATE HEALTH INSURANCE | Attending: Emergency Medicine | Admitting: Emergency Medicine

## 2012-04-08 ENCOUNTER — Emergency Department (HOSPITAL_COMMUNITY): Payer: PRIVATE HEALTH INSURANCE

## 2012-04-08 DIAGNOSIS — Z8679 Personal history of other diseases of the circulatory system: Secondary | ICD-10-CM | POA: Insufficient documentation

## 2012-04-08 DIAGNOSIS — Z8639 Personal history of other endocrine, nutritional and metabolic disease: Secondary | ICD-10-CM | POA: Insufficient documentation

## 2012-04-08 DIAGNOSIS — R319 Hematuria, unspecified: Secondary | ICD-10-CM | POA: Insufficient documentation

## 2012-04-08 DIAGNOSIS — Z87891 Personal history of nicotine dependence: Secondary | ICD-10-CM | POA: Insufficient documentation

## 2012-04-08 DIAGNOSIS — I10 Essential (primary) hypertension: Secondary | ICD-10-CM | POA: Insufficient documentation

## 2012-04-08 DIAGNOSIS — Z862 Personal history of diseases of the blood and blood-forming organs and certain disorders involving the immune mechanism: Secondary | ICD-10-CM | POA: Insufficient documentation

## 2012-04-08 DIAGNOSIS — Z8669 Personal history of other diseases of the nervous system and sense organs: Secondary | ICD-10-CM | POA: Insufficient documentation

## 2012-04-08 DIAGNOSIS — J40 Bronchitis, not specified as acute or chronic: Secondary | ICD-10-CM | POA: Insufficient documentation

## 2012-04-08 DIAGNOSIS — Z8739 Personal history of other diseases of the musculoskeletal system and connective tissue: Secondary | ICD-10-CM | POA: Insufficient documentation

## 2012-04-08 DIAGNOSIS — N400 Enlarged prostate without lower urinary tract symptoms: Secondary | ICD-10-CM | POA: Insufficient documentation

## 2012-04-08 DIAGNOSIS — Z79899 Other long term (current) drug therapy: Secondary | ICD-10-CM | POA: Insufficient documentation

## 2012-04-08 DIAGNOSIS — Z8659 Personal history of other mental and behavioral disorders: Secondary | ICD-10-CM | POA: Insufficient documentation

## 2012-04-08 DIAGNOSIS — Z8719 Personal history of other diseases of the digestive system: Secondary | ICD-10-CM | POA: Insufficient documentation

## 2012-04-08 DIAGNOSIS — Z7982 Long term (current) use of aspirin: Secondary | ICD-10-CM | POA: Insufficient documentation

## 2012-04-08 DIAGNOSIS — Z86718 Personal history of other venous thrombosis and embolism: Secondary | ICD-10-CM | POA: Insufficient documentation

## 2012-04-08 LAB — URINALYSIS, ROUTINE W REFLEX MICROSCOPIC
Bilirubin Urine: NEGATIVE
Ketones, ur: NEGATIVE mg/dL
Nitrite: NEGATIVE
Protein, ur: NEGATIVE mg/dL
Specific Gravity, Urine: 1.021 (ref 1.005–1.030)
Urobilinogen, UA: 0.2 mg/dL (ref 0.0–1.0)

## 2012-04-08 LAB — CBC WITH DIFFERENTIAL/PLATELET
Basophils Relative: 1 % (ref 0–1)
Eosinophils Absolute: 0.6 10*3/uL (ref 0.0–0.7)
Eosinophils Relative: 8 % — ABNORMAL HIGH (ref 0–5)
HCT: 36.8 % — ABNORMAL LOW (ref 39.0–52.0)
Hemoglobin: 12.9 g/dL — ABNORMAL LOW (ref 13.0–17.0)
MCH: 28 pg (ref 26.0–34.0)
MCHC: 35.1 g/dL (ref 30.0–36.0)
MCV: 80 fL (ref 78.0–100.0)
Monocytes Absolute: 0.4 10*3/uL (ref 0.1–1.0)
Neutro Abs: 2.6 10*3/uL (ref 1.7–7.7)
Neutrophils Relative %: 37 % — ABNORMAL LOW (ref 43–77)
RBC: 4.6 MIL/uL (ref 4.22–5.81)

## 2012-04-08 LAB — PROTIME-INR
INR: 1.06 (ref 0.00–1.49)
Prothrombin Time: 13.7 seconds (ref 11.6–15.2)

## 2012-04-08 LAB — APTT: aPTT: 34 seconds (ref 24–37)

## 2012-04-08 LAB — BASIC METABOLIC PANEL
BUN: 10 mg/dL (ref 6–23)
CO2: 24 mEq/L (ref 19–32)
Calcium: 9.2 mg/dL (ref 8.4–10.5)
GFR calc non Af Amer: 64 mL/min — ABNORMAL LOW (ref >90)
Glucose, Bld: 82 mg/dL (ref 70–99)
Sodium: 136 mEq/L (ref 135–145)

## 2012-04-08 MED ORDER — IOHEXOL 300 MG/ML  SOLN
100.0000 mL | Freq: Once | INTRAMUSCULAR | Status: AC | PRN
  Administered 2012-04-08: 100 mL via INTRAVENOUS

## 2012-04-08 MED ORDER — IOHEXOL 300 MG/ML  SOLN
50.0000 mL | Freq: Once | INTRAMUSCULAR | Status: AC | PRN
  Administered 2012-04-08: 50 mL via ORAL

## 2012-04-08 NOTE — ED Notes (Signed)
Pt finished 1st cup oral contrast. CT notified.

## 2012-04-08 NOTE — ED Provider Notes (Signed)
History     CSN: [id number]  Arrival date & time 04/08/12  1503   First MD Initiated Contact with Patient 04/08/12 2003      Chief Complaint  Patient presents with  . Hematuria    (Consider location/radiation/quality/duration/timing/severity/associated sxs/prior treatment) Patient is a 72 y.o. male presenting with hematuria.  Hematuria    Pt with history of DVT no longer on coumadin was seen recently for URI symptoms and diagnosed with bronchitis, given inhaler and Keflex with some improvement. Today he noticed streaks of blood in his urine, but denies any dysuria, frequency, abdominal pain, fever, vomiting or back pain. He was evaluated for hematuria about 2 years ago, but at that time was also having UTI symptoms and symptoms cleared with Abx.  Past Medical History  Diagnosis Date  . Pulmonary embolism     with DVT in 8/08 pt continues to be on coumadin and this eepisode appears unprovoked  . HTN (hypertension)   . BPH (benign prostatic hyperplasia)   . History of hypercholesterolemia   . Other chest pain     Adenosine myoview in 2004 showing EF 52% with no evidence for ischemia.  There was a basal inferior fixed defect that my have been artifact.  . Hypercholesterolemia   . Diverticulitis     colonoscopy in 12/05  . Osteoarthritis     Primarily left knee, acute right knee swelling in 9/07  . Colitis     3-4 ulcers on colonoscopy in 12/05  . Wrist disorder     Proximal carpo-scapholunate dissosiation -seen by Dr. Lita Mains  . Foot pain, left     h/o  . Depression   . Gastritis   . Syncope     Suspect neurocardiogenic.  Always with standing or micturation.  Had bradycardic/hypotensive response with standing when hospitalized in 1/12.  On Florinef.  Echo (1/12): EF 60-65%, mild aortic root dilation.   . Aortic root dilatation 2012    Seen on Echo in 2012.  39mm    Past Surgical History  Procedure Laterality Date  . Transurethral resection of prostate  3/05   Vonita Moss    Family History  Problem Relation Age of Onset  . Heart disease Father   . Stroke Father   . Heart disease Brother 38    History  Substance Use Topics  . Smoking status: Former Games developer  . Smokeless tobacco: Not on file  . Alcohol Use: No      Review of Systems  Genitourinary: Positive for hematuria.   All other systems reviewed and are negative except as noted in HPI.   Allergies  Review of patient's allergies indicates no known allergies.  Home Medications   Current Outpatient Rx  Name  Route  Sig  Dispense  Refill  . albuterol (PROVENTIL HFA;VENTOLIN HFA) 108 (90 BASE) MCG/ACT inhaler   Inhalation   Inhale 2 puffs into the lungs every 6 (six) hours as needed for wheezing.         Marland Kitchen aspirin EC 81 MG tablet   Oral   Take 81 mg by mouth every evening.         . cephALEXin (KEFLEX) 500 MG capsule   Oral   Take 1 capsule (500 mg total) by mouth 4 (four) times daily.   28 capsule   0   . lisinopril (PRINIVIL,ZESTRIL) 20 MG tablet   Oral   Take 1 tablet (20 mg total) by mouth daily.   30 tablet   6   .  Pseudoeph-Doxylamine-DM-APAP (NYQUIL PO)   Oral   Take 30 mLs by mouth at bedtime as needed (for cough).           BP 180/71  Pulse 72  Temp(Src) 98.2 F (36.8 C)  Resp 18  SpO2 99%  Physical Exam  Nursing note and vitals reviewed. Constitutional: He is oriented to person, place, and time. He appears well-developed and well-nourished.  HENT:  Head: Normocephalic and atraumatic.  Eyes: EOM are normal. Pupils are equal, round, and reactive to light.  Neck: Normal range of motion. Neck supple.  Cardiovascular: Normal rate, normal heart sounds and intact distal pulses.   Pulmonary/Chest: Effort normal and breath sounds normal.  Abdominal: Bowel sounds are normal. He exhibits no distension. There is no tenderness.  Genitourinary: Penis normal.  No sores or discharge, no testicular tenderness  Musculoskeletal: Normal range of motion. He  exhibits no edema and no tenderness.  Neurological: He is alert and oriented to person, place, and time. He has normal strength. No cranial nerve deficit or sensory deficit.  Skin: Skin is warm and dry. No rash noted.  Psychiatric: He has a normal mood and affect.    ED Course  Procedures (including critical care time)  Labs Reviewed  URINALYSIS, ROUTINE W REFLEX MICROSCOPIC - Abnormal; Notable for the following:    APPearance CLOUDY (*)    Hgb urine dipstick LARGE (*)    Leukocytes, UA SMALL (*)    All other components within normal limits  URINE MICROSCOPIC-ADD ON - Abnormal; Notable for the following:    Bacteria, UA FEW (*)    All other components within normal limits  CBC WITH DIFFERENTIAL - Abnormal; Notable for the following:    Hemoglobin 12.9 (*)    HCT 36.8 (*)    Neutrophils Relative 37 (*)    Lymphocytes Relative 48 (*)    Eosinophils Relative 8 (*)    All other components within normal limits  BASIC METABOLIC PANEL - Abnormal; Notable for the following:    GFR calc non Af Amer 64 (*)    GFR calc Af Amer 74 (*)    All other components within normal limits  PROTIME-INR  APTT   Ct Abdomen Pelvis W Contrast  04/08/2012  **ADDENDUM** CREATED: 04/08/2012 22:05:26  Discussed the case with the ordering physician, Dr. Bernette Mayers. The history provided by the technologist of prostate cancer may be inaccurate, as there is no documentation of this elsewhere. There is a history of BPH with prior TURP, which is also compatible with the imaging findings.  **END ADDENDUM** SIGNED BY: Waneta Martins, M.D.   04/08/2012  *RADIOLOGY REPORT*  Clinical Data: Hematuria, prostate cancer.  CT ABDOMEN AND PELVIS WITH CONTRAST  Technique:  Multidetector CT imaging of the abdomen and pelvis was performed following the standard protocol during bolus administration of intravenous contrast.  Contrast: OMNIPAQUE IOHEXOL 300 MG/ML  SOLN  Comparison: 08/31/2010  Findings: The lung bases are clear.   Heart size within normal limits.  Punctate calcifications within the liver, in keeping with sequelae of prior granulomas infection.  Unremarkable spleen, pancreas, biliary system, adrenal glands.  There are bilateral renal cysts and too small to further characterize hypodensities.  No hydronephrosis or hydroureter. Bifid left renal pelvis.  Colonic diverticulosis and mild pericolonic fat stranding along a segment of descending colon which may reflect sequelae of prior inflammation or a mild acute diverticulitis in the appropriate clinical setting.  Normal appendix.  Horizontal segment of the duodenum shows a  diverticulum arising along the superior margin.  Thick-walled segment of ileum in the right lower quadrant (series 2 image 67).  No free intraperitoneal air or fluid.  No lymphadenopathy.  Normal caliber aorta and branch vessels.  Partially decompressed bladder without intraluminal filling defect. Heterogeneous enhancement of a large and lobular prostate gland is nonspecific.  Fat containing inguinal hernias.  Multilevel degenerative changes without acute osseous finding.  IMPRESSION: Colon diverticulosis.  Mild pericolonic fat stranding along a segment of descending colon may reflect sequelae of prior inflammation/diverticulitis.  Correlate clinically if concern for mild / acute inflammation.  There is a thick-walled short segment of proximal ileum in the right lower quadrant.  This may be a pseudo lesion due to peristalsis, sequelae of prior inflammation, or a mass. Consider GI follow up and capsule endoscopy to further evaluate.  Enlarged/lobular prostate gland. Per the provided history, the patient has known prostate cancer.  Original Report Authenticated By: Jearld Lesch, M.D.      No diagnosis found.    MDM  UA shows hematuria, but no signs of infection. Will evaluate for painless hematuria. Labs and CT ordered.   10:25 PM Pt continues to have small clots in his urine. Labs and imaging have  been essentially unremarkable. CT shows some enlarged prostate, has history of BPH s/p TERP. No known prostate CA. Other GI findings are likely incidental and not related to hematuria.He has followup appointment at PCP in 3 days. Advised to continue Keflex, return for any other concerns.       Charles B. Bernette Mayers, MD 04/08/12 2229

## 2012-04-08 NOTE — ED Notes (Signed)
Pt reports that he has had blood in his urine that started this morning.  Denies pain.

## 2012-04-10 ENCOUNTER — Encounter: Payer: Self-pay | Admitting: Internal Medicine

## 2012-04-10 ENCOUNTER — Ambulatory Visit (INDEPENDENT_AMBULATORY_CARE_PROVIDER_SITE_OTHER): Payer: PRIVATE HEALTH INSURANCE | Admitting: Internal Medicine

## 2012-04-10 VITALS — BP 127/79 | HR 60 | Temp 98.4°F | Wt 210.0 lb

## 2012-04-10 DIAGNOSIS — R319 Hematuria, unspecified: Secondary | ICD-10-CM | POA: Insufficient documentation

## 2012-04-10 DIAGNOSIS — J209 Acute bronchitis, unspecified: Secondary | ICD-10-CM

## 2012-04-10 LAB — POCT URINALYSIS DIPSTICK
Glucose, UA: NEGATIVE
Nitrite, UA: NEGATIVE
Protein, UA: 30
Spec Grav, UA: >=1.03
Urobilinogen, UA: 0.2

## 2012-04-10 MED ORDER — ALBUTEROL SULFATE HFA 108 (90 BASE) MCG/ACT IN AERS: 2.0000 | INHALATION_SPRAY | Freq: Four times a day (QID) | RESPIRATORY_TRACT | Status: DC | PRN

## 2012-04-10 MED ORDER — BENZONATATE 100 MG PO CAPS: 100.0000 mg | ORAL_CAPSULE | Freq: Three times a day (TID) | ORAL | Status: DC | PRN
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
## 2012-04-10 NOTE — Patient Instructions (Signed)
1.  Finish today's doses of the antibiotic and then stop  2.  Continue the albuterol up to 4 times daily while you recover from the cough.  3.  Start Tessalon Perrles take 1 tablet 3 times daily for the cough.  4. When you are feeling better from the cough we will set you up for the breathing tests.

## 2012-05-08 ENCOUNTER — Encounter: Payer: PRIVATE HEALTH INSURANCE | Admitting: Internal Medicine

## 2012-06-12 ENCOUNTER — Ambulatory Visit (INDEPENDENT_AMBULATORY_CARE_PROVIDER_SITE_OTHER): Payer: PRIVATE HEALTH INSURANCE | Admitting: Internal Medicine

## 2012-06-12 ENCOUNTER — Encounter: Payer: Self-pay | Admitting: Internal Medicine

## 2012-06-12 VITALS — BP 149/76 | HR 56 | Temp 97.0°F | Ht 70.25 in | Wt 208.1 lb

## 2012-06-12 DIAGNOSIS — R319 Hematuria, unspecified: Secondary | ICD-10-CM

## 2012-06-12 DIAGNOSIS — H409 Unspecified glaucoma: Secondary | ICD-10-CM | POA: Insufficient documentation

## 2012-06-12 DIAGNOSIS — I10 Essential (primary) hypertension: Secondary | ICD-10-CM

## 2012-06-12 DIAGNOSIS — R0602 Shortness of breath: Secondary | ICD-10-CM | POA: Insufficient documentation

## 2012-06-12 MED ORDER — TRAVOPROST 0.004 % OP SOLN: 1.0000 [drp] | Freq: Every day | OPHTHALMIC | Status: DC

## 2012-06-12 NOTE — Patient Instructions (Signed)
1.  Continue your medications as prescribed for now.  2.  We will work to schedule you for the breathing tests.  When the results come in someone will call you.  3.  Follow up in July or August to meet your new primary care doctor.  4.  It has been an absolute pleasure caring for your over the last few years.  I wish you all the best in the future.
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
## 2012-06-15 NOTE — Progress Notes (Signed)
Talked with daughter Corrie Dandy - appt for PFT [facility] 06/19/12 2PM. Stanton Kidney Shaiden Aldous RN 06/15/12 3:10PM

## 2012-06-18 NOTE — Assessment & Plan Note (Signed)
Recently diagnosed.  On Travatan.

## 2012-06-18 NOTE — Assessment & Plan Note (Signed)
He has had several episodes that are consistent with COPD exacerbations in recent months but has never had formal PFT testing done.  We will schedule him for those today to see if he has any obstructive or restrictive disease.  He is not limited in his activity and has not been hospitalized for his breathing problems recently.

## 2012-06-18 NOTE — Assessment & Plan Note (Signed)
This has resolved since his last appointment.  Cystoscopy and DRE did not reveal any cause of the hematuria.  Possibly secondary to reaction to Keflex.

## 2012-06-18 NOTE — Assessment & Plan Note (Signed)
BP Readings from Last 3 Encounters:  06/12/12 149/76  04/10/12 127/79  04/08/12 180/71    Lab Results  Component Value Date   NA 136 04/08/2012   K HEMOLYSIS AT THIS LEVEL MAY AFFECT RESULT 04/08/2012   CREATININE 1.13 04/08/2012    Assessment: Blood pressure control: controlled Progress toward BP goal:  at goal Comments: Blood pressure is below his goal of <150/90.  He denies headache, vision changes, dizziness on standing, or chest pain.   Plan: Medications:  continue current medications, Lisinopril 20 mg daily  Educational resources provided: brochure;handout;video Self management tools provided:   Other plans:

## 2012-06-18 NOTE — Progress Notes (Signed)
Case discussed with Dr. Pribula soon after the resident saw the patient.  We reviewed the resident's history and exam and pertinent patient test results.  I agree with the assessment, diagnosis and plan of care documented in the resident's note. 

## 2012-06-19 ENCOUNTER — Ambulatory Visit (HOSPITAL_COMMUNITY)
Admission: RE | Admit: 2012-06-19 | Discharge: 2012-06-19 | Disposition: A | Discharge status: Home / Self Care | Payer: Medicare Other | Source: Ambulatory Visit | Attending: Internal Medicine | Admitting: Internal Medicine

## 2012-06-19 DIAGNOSIS — R0602 Shortness of breath: Secondary | ICD-10-CM

## 2012-06-19 DIAGNOSIS — R062 Wheezing: Secondary | ICD-10-CM | POA: Insufficient documentation

## 2012-06-19 MED ORDER — ALBUTEROL SULFATE (5 MG/ML) 0.5% IN NEBU
2.5000 mg | INHALATION_SOLUTION | Freq: Once | RESPIRATORY_TRACT | Status: AC
  Administered 2012-06-19: 2.5 mg via RESPIRATORY_TRACT

## 2012-07-26 DIAGNOSIS — J209 Acute bronchitis, unspecified: Secondary | ICD-10-CM | POA: Insufficient documentation

## 2012-07-26 NOTE — Assessment & Plan Note (Signed)
He continues to have large blood in his urine today.  Temporally this correlates with the start of Keflex which is almost exclusively eliminated in the urine unchanged.  We will refer him over to urology for likely cystoscopy to ensure there is no more worrisome cause for his gross hematuria

## 2012-07-26 NOTE — Assessment & Plan Note (Signed)
HE hsa finished 10 days of the 14 days prescribed of Keflex.  We will have him take today's last dose and then stop.  He will follow up in a few weeks to schedule PFTS.

## 2012-08-31 ENCOUNTER — Other Ambulatory Visit: Payer: Self-pay | Admitting: Internal Medicine

## 2012-10-08 ENCOUNTER — Encounter: Payer: PRIVATE HEALTH INSURANCE | Admitting: Internal Medicine

## 2012-11-26 ENCOUNTER — Encounter: Payer: PRIVATE HEALTH INSURANCE | Admitting: Internal Medicine

## 2012-11-26 ENCOUNTER — Encounter: Payer: Self-pay | Admitting: Internal Medicine

## 2012-11-26 ENCOUNTER — Ambulatory Visit (INDEPENDENT_AMBULATORY_CARE_PROVIDER_SITE_OTHER): Payer: Medicare Other | Admitting: Internal Medicine

## 2012-11-26 VITALS — BP 147/86 | HR 64 | Temp 97.9°F | Ht 70.0 in | Wt 202.7 lb

## 2012-11-26 DIAGNOSIS — I10 Essential (primary) hypertension: Secondary | ICD-10-CM

## 2012-11-26 DIAGNOSIS — Z23 Encounter for immunization: Secondary | ICD-10-CM

## 2012-11-26 DIAGNOSIS — Z0000 Encounter for general adult medical examination without abnormal findings: Secondary | ICD-10-CM

## 2012-11-26 DIAGNOSIS — H409 Unspecified glaucoma: Secondary | ICD-10-CM

## 2012-11-26 MED ORDER — ZOSTER VACCINE LIVE 19400 UNT/0.65ML ~~LOC~~ SOLR: 0.6500 mL | Freq: Once | SUBCUTANEOUS | Status: DC
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
## 2012-11-26 NOTE — Assessment & Plan Note (Addendum)
On exam bilateral eyes are injected, and per patient this is normal for him. Patient with a history of glaucoma for which he takes Travatan ophthalmic drops at nighttime. He is followed by an ophthalmologist.
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
## 2012-11-26 NOTE — Assessment & Plan Note (Signed)
Patient received the flu shot today. A prescription was given to him for the Zostavax.

## 2012-12-07 NOTE — Progress Notes (Signed)
Case discussed with Dr. Glenn soon after the resident saw the patient.  We reviewed the resident's history and exam and pertinent patient test results.  I agree with the assessment, diagnosis and plan of care documented in the resident's note. 

## 2013-02-04 ENCOUNTER — Ambulatory Visit (INDEPENDENT_AMBULATORY_CARE_PROVIDER_SITE_OTHER): Payer: Commercial Managed Care - HMO | Admitting: Internal Medicine

## 2013-02-04 ENCOUNTER — Encounter: Payer: Self-pay | Admitting: Internal Medicine

## 2013-02-04 VITALS — BP 151/89 | HR 63 | Temp 97.0°F | Ht 71.0 in | Wt 207.3 lb

## 2013-02-04 DIAGNOSIS — I10 Essential (primary) hypertension: Secondary | ICD-10-CM

## 2013-02-04 DIAGNOSIS — Z0000 Encounter for general adult medical examination without abnormal findings: Secondary | ICD-10-CM

## 2013-02-04 DIAGNOSIS — R0602 Shortness of breath: Secondary | ICD-10-CM

## 2013-02-04 LAB — BASIC METABOLIC PANEL WITH GFR
BUN: 12 mg/dL (ref 6–23)
CHLORIDE: 99 meq/L (ref 96–112)
CO2: 29 meq/L (ref 19–32)
CREATININE: 1.03 mg/dL (ref 0.50–1.35)
Calcium: 9.4 mg/dL (ref 8.4–10.5)
GFR, Est African American: 84 mL/min
GFR, Est Non African American: 73 mL/min
Glucose, Bld: 75 mg/dL (ref 70–99)
POTASSIUM: 3.7 meq/L (ref 3.5–5.3)
Sodium: 135 mEq/L (ref 135–145)

## 2013-02-04 MED ORDER — LISINOPRIL 20 MG PO TABS: ORAL_TABLET | ORAL | Status: DC

## 2013-02-04 MED ORDER — ALBUTEROL SULFATE HFA 108 (90 BASE) MCG/ACT IN AERS: 2.0000 | INHALATION_SPRAY | Freq: Four times a day (QID) | RESPIRATORY_TRACT | Status: DC | PRN

## 2013-02-04 MED ORDER — ZOSTER VACCINE LIVE 19400 UNT/0.65ML ~~LOC~~ SOLR: 0.6500 mL | Freq: Once | SUBCUTANEOUS | Status: DC

## 2013-02-04 MED ORDER — HYDROCHLOROTHIAZIDE 12.5 MG PO CAPS: 12.5000 mg | ORAL_CAPSULE | Freq: Every day | ORAL | Status: DC

## 2013-02-04 NOTE — Progress Notes (Signed)
Case discussed with Dr. Glenn soon after the resident saw the patient.  We reviewed the resident's history and exam and pertinent patient test results.  I agree with the assessment, diagnosis, and plan of care documented in the resident's note. 

## 2013-02-04 NOTE — Assessment & Plan Note (Signed)
BP Readings from Last 3 Encounters:  02/04/13 151/89  11/26/12 147/86  06/12/12 149/76    Lab Results  Component Value Date   NA 136 04/08/2012   K HEMOLYSIS AT THIS LEVEL MAY AFFECT RESULT 04/08/2012   CREATININE 1.13 04/08/2012    Assessment: Blood pressure control: mildly elevated Progress toward BP goal:  deteriorated Comments: His BP continues to slowly increase despite reported compliance with his Lisinopril. He does endorse dietary indiscretions over the holidays which could have contributed to his increased blood pressure today.   Plan: Medications:  continue current medications, and begin HCTZ 12.5mg  daily. With his h/o syncope, he will need to be monitored closely. He is to call the clinic if he begins to feel dizzy or lightheaded after starting the HCTZ. Educational resources provided: Therapist, sports tools provided:   Other plans: Checking a BMP today. He is to follow up within the next 6 weeks.

## 2013-02-04 NOTE — Patient Instructions (Signed)
Continue to take the Lisinopril 20mg  daily. I am adding another medication called HCTZ, which you are supposed to take one tablet daily. If you begin to feel dizzy or lightheaded, please call the clinic, as this new medication could be causing that.   Hydrochlorothiazide, HCTZ capsules or tablets What is this medicine? HYDROCHLOROTHIAZIDE (hye droe klor oh THYE a zide) is a diuretic. It increases the amount of urine passed, which causes the body to lose salt and water. This medicine is used to treat high blood pressure. It is also reduces the swelling and water retention caused by various medical conditions, such as heart, liver, or kidney disease. This medicine may be used for other purposes; ask your health care provider or pharmacist if you have questions. COMMON BRAND NAME(S): Esidrix, Ezide, HydroDIURIL, Microzide, Oretic, Zide What should I tell my health care provider before I take this medicine? They need to know if you have any of these conditions: -diabetes -gout -immune system problems, like lupus -kidney disease or kidney stones -liver disease -pancreatitis -small amount of urine or difficulty passing urine -an unusual or allergic reaction to hydrochlorothiazide, sulfa drugs, other medicines, foods, dyes, or preservatives -pregnant or trying to get pregnant -breast-feeding How should I use this medicine? Take this medicine by mouth with a glass of water. Follow the directions on the prescription label. Take your medicine at regular intervals. Remember that you will need to pass urine frequently after taking this medicine. Do not take your doses at a time of day that will cause you problems. Do not stop taking your medicine unless your doctor tells you to. Talk to your pediatrician regarding the use of this medicine in children. Special care may be needed. Overdosage: If you think you have taken too much of this medicine contact a poison control center or emergency room at  once. NOTE: This medicine is only for you. Do not share this medicine with others. What if I miss a dose? If you miss a dose, take it as soon as you can. If it is almost time for your next dose, take only that dose. Do not take double or extra doses. What may interact with this medicine? -cholestyramine -colestipol -digoxin -dofetilide -lithium -medicines for blood pressure -medicines for diabetes -medicines that relax muscles for surgery -other diuretics -steroid medicines like prednisone or cortisone This list may not describe all possible interactions. Give your health care provider a list of all the medicines, herbs, non-prescription drugs, or dietary supplements you use. Also tell them if you smoke, drink alcohol, or use illegal drugs. Some items may interact with your medicine. What should I watch for while using this medicine? Visit your doctor or health care professional for regular checks on your progress. Check your blood pressure as directed. Ask your doctor or health care professional what your blood pressure should be and when you should contact him or her. You may need to be on a special diet while taking this medicine. Ask your doctor. Check with your doctor or health care professional if you get an attack of severe diarrhea, nausea and vomiting, or if you sweat a lot. The loss of too much body fluid can make it dangerous for you to take this medicine. You may get drowsy or dizzy. Do not drive, use machinery, or do anything that needs mental alertness until you know how this medicine affects you. Do not stand or sit up quickly, especially if you are an older patient. This reduces the risk of dizzy  or fainting spells. Alcohol may interfere with the effect of this medicine. Avoid alcoholic drinks. This medicine may affect your blood sugar level. If you have diabetes, check with your doctor or health care professional before changing the dose of your diabetic medicine. This medicine  can make you more sensitive to the sun. Keep out of the sun. If you cannot avoid being in the sun, wear protective clothing and use sunscreen. Do not use sun lamps or tanning beds/booths. What side effects may I notice from receiving this medicine? Side effects that you should report to your doctor or health care professional as soon as possible: -allergic reactions such as skin rash or itching, hives, swelling of the lips, mouth, tongue, or throat -changes in vision -chest pain -eye pain -fast or irregular heartbeat -feeling faint or lightheaded, falls -gout attack -muscle pain or cramps -pain or difficulty when passing urine -pain, tingling, numbness in the hands or feet -redness, blistering, peeling or loosening of the skin, including inside the mouth -unusually weak or tired Side effects that usually do not require medical attention (report to your doctor or health care professional if they continue or are bothersome): -change in sex drive or performance -dry mouth -headache -stomach upset This list may not describe all possible side effects. Call your doctor for medical advice about side effects. You may report side effects to FDA at 1-800-FDA-1088. Where should I keep my medicine? Keep out of the reach of children. Store at room temperature between 15 and 30 degrees C (59 and 86 degrees F). Do not freeze. Protect from light and moisture. Keep container closed tightly. Throw away any unused medicine after the expiration date. NOTE: This sheet is a summary. It may not cover all possible information. If you have questions about this medicine, talk to your doctor, pharmacist, or health care provider.  2014, [employer]. (2009-09-08 12:57:37)
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
## 2013-03-18 ENCOUNTER — Encounter: Payer: Medicare Other | Admitting: Internal Medicine

## 2013-04-01 ENCOUNTER — Other Ambulatory Visit: Payer: Self-pay | Admitting: Internal Medicine

## 2013-04-01 DIAGNOSIS — Z0000 Encounter for general adult medical examination without abnormal findings: Secondary | ICD-10-CM

## 2013-04-01 MED ORDER — ZOSTER VACCINE LIVE 19400 UNT/0.65ML ~~LOC~~ SOLR: 0.6500 mL | Freq: Once | SUBCUTANEOUS | Status: DC

## 2013-04-01 MED ORDER — ZOSTER VACCINE LIVE 19400 UNT/0.65ML ~~LOC~~ SOLR
0.6500 mL | Freq: Once | SUBCUTANEOUS | Status: DC
Start: 2013-04-01 — End: 2013-07-13

## 2013-04-08 ENCOUNTER — Ambulatory Visit (INDEPENDENT_AMBULATORY_CARE_PROVIDER_SITE_OTHER): Payer: Medicare HMO | Admitting: Internal Medicine

## 2013-04-08 VITALS — BP 130/78 | HR 69 | Temp 97.0°F | Ht 71.0 in | Wt 204.5 lb

## 2013-04-08 DIAGNOSIS — I77810 Thoracic aortic ectasia: Secondary | ICD-10-CM

## 2013-04-08 DIAGNOSIS — Z0000 Encounter for general adult medical examination without abnormal findings: Secondary | ICD-10-CM

## 2013-04-08 DIAGNOSIS — Z8669 Personal history of other diseases of the nervous system and sense organs: Secondary | ICD-10-CM

## 2013-04-08 DIAGNOSIS — I10 Essential (primary) hypertension: Secondary | ICD-10-CM

## 2013-04-08 NOTE — Patient Instructions (Addendum)
Continue to take your medications as prescribed, and take the hydrochlorothiazide with food.   Come back to see me in 3 months or sooner if needed.   If you begin to have dizziness, fall, or pass out, please call the clinic ASAP.
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
## 2013-04-09 NOTE — Assessment & Plan Note (Addendum)
BP Readings from Last 3 Encounters:  04/08/13 130/78  02/04/13 151/89  11/26/12 147/86    Lab Results  Component Value Date   NA 135 02/04/2013   K 3.7 02/04/2013   CREATININE 1.03 02/04/2013    Assessment: Blood pressure control: controlled Progress toward BP goal:  at goal Comments: Since starting the HCTZ 12.5mg  daily, his blood pressure is better controlled.   Plan: Medications:  continue current medications Educational resources provided: brochure;handout;video Self management tools provided:   Other plans: f/u in 6 months. Will check BMP at that time.

## 2013-04-09 NOTE — Assessment & Plan Note (Signed)
No further episodes of syncope since stating the HCTZ.

## 2013-04-09 NOTE — Assessment & Plan Note (Signed)
Last lipid panel was wnl. Will recheck lipid panel at his next clinic visit.

## 2013-04-09 NOTE — Assessment & Plan Note (Addendum)
Per note from 12/2011, the pt was to have a repeat ECHO to evaluate his aortic root dilation. However, the last ECHO in our medical records is from 01/2010, which showed mild aortic root dilation to 42mm with no significant valvular abnormality. Will repeat ECHO to assess stability of the aortic root and the valve.  - 2D ECHO

## 2013-04-15 NOTE — Progress Notes (Signed)
Pt aware of appt at [facility] 2D echo 04/27/13 2PM. No restrictions. Hilda Blades Amaura Authier RN 04/15/13 4PM

## 2013-04-22 NOTE — Progress Notes (Signed)
Case discussed with Dr. Glenn at the time of the visit.  We reviewed the resident's history and exam and pertinent patient test results.  I agree with the assessment, diagnosis, and plan of care documented in the resident's note.   

## 2013-04-27 ENCOUNTER — Ambulatory Visit (HOSPITAL_COMMUNITY)
Admission: RE | Admit: 2013-04-27 | Discharge: 2013-04-27 | Disposition: A | Discharge status: Home / Self Care | Payer: Medicare HMO | Source: Ambulatory Visit | Attending: Internal Medicine | Admitting: Internal Medicine

## 2013-04-27 DIAGNOSIS — I77819 Aortic ectasia, unspecified site: Secondary | ICD-10-CM | POA: Insufficient documentation

## 2013-04-27 DIAGNOSIS — I517 Cardiomegaly: Secondary | ICD-10-CM

## 2013-04-27 DIAGNOSIS — I77810 Thoracic aortic ectasia: Secondary | ICD-10-CM

## 2013-04-27 DIAGNOSIS — Z86711 Personal history of pulmonary embolism: Secondary | ICD-10-CM | POA: Insufficient documentation

## 2013-04-27 DIAGNOSIS — I10 Essential (primary) hypertension: Secondary | ICD-10-CM | POA: Insufficient documentation

## 2013-04-27 DIAGNOSIS — Z87891 Personal history of nicotine dependence: Secondary | ICD-10-CM | POA: Insufficient documentation

## 2013-04-27 NOTE — Progress Notes (Signed)
*  PRELIMINARY RESULTS* Echocardiogram 2D Echocardiogram has been performed.  Leavy Cella 04/27/2013, 2:49 PM

## 2013-07-13 ENCOUNTER — Encounter: Payer: Self-pay | Admitting: Internal Medicine

## 2013-07-13 ENCOUNTER — Ambulatory Visit (INDEPENDENT_AMBULATORY_CARE_PROVIDER_SITE_OTHER): Payer: Medicare HMO | Admitting: Internal Medicine

## 2013-07-13 VITALS — BP 123/81 | HR 61 | Temp 97.5°F | Ht 70.0 in | Wt 193.2 lb

## 2013-07-13 DIAGNOSIS — I10 Essential (primary) hypertension: Secondary | ICD-10-CM

## 2013-07-13 DIAGNOSIS — J45909 Unspecified asthma, uncomplicated: Secondary | ICD-10-CM

## 2013-07-13 DIAGNOSIS — J4520 Mild intermittent asthma, uncomplicated: Secondary | ICD-10-CM | POA: Insufficient documentation

## 2013-07-13 DIAGNOSIS — H409 Unspecified glaucoma: Secondary | ICD-10-CM

## 2013-07-13 DIAGNOSIS — Z0000 Encounter for general adult medical examination without abnormal findings: Secondary | ICD-10-CM

## 2013-07-13 DIAGNOSIS — Z8679 Personal history of other diseases of the circulatory system: Secondary | ICD-10-CM

## 2013-07-13 DIAGNOSIS — R319 Hematuria, unspecified: Secondary | ICD-10-CM

## 2013-07-13 DIAGNOSIS — D649 Anemia, unspecified: Secondary | ICD-10-CM

## 2013-07-13 HISTORY — DX: Mild intermittent asthma, uncomplicated: J45.20

## 2013-07-13 HISTORY — DX: Unspecified asthma, uncomplicated: J45.909

## 2013-07-13 MED ORDER — LISINOPRIL 20 MG PO TABS: ORAL_TABLET | ORAL | Status: DC

## 2013-07-13 MED ORDER — HYDROCHLOROTHIAZIDE 12.5 MG PO CAPS: 12.5000 mg | ORAL_CAPSULE | Freq: Every day | ORAL | Status: DC

## 2013-07-13 NOTE — Progress Notes (Signed)
   Subjective:    Patient ID: [name], male    DOB: 1941-01-20, 73 y.o.   MRN: [id number]  HPI  This is my first visit with [name] as his insurance company forced him to change providers. Please see the A&P for the status of the pt's chronic medical problems.   Review of Systems  Constitutional: Positive for appetite change. Negative for unexpected weight change.  HENT: Negative for rhinorrhea.   Eyes: Negative for itching and visual disturbance.  Respiratory: Positive for cough. Negative for shortness of breath.   Cardiovascular: Negative for chest pain.  Gastrointestinal: Negative for abdominal pain and blood in stool.  Genitourinary: Negative for hematuria and difficulty urinating.       + nocturia x 1  Musculoskeletal: Positive for gait problem and joint swelling.       C/O swelling R knee, resolved with elevation of leg.   Skin: Negative for rash.  Neurological: Negative for syncope and headaches.  Psychiatric/Behavioral: Negative for sleep disturbance.       Objective:   Physical Exam  Constitutional: He is oriented to person, place, and time. He appears well-developed and well-nourished. No distress.  HENT:  Head: Normocephalic and atraumatic.  Right Ear: External ear normal.  Left Ear: External ear normal.  Nose: Nose normal.  Eyes: Conjunctivae and EOM are normal.  Cardiovascular: Normal rate, regular rhythm and normal heart sounds.   Pulmonary/Chest: Effort normal and breath sounds normal. No respiratory distress.  Musculoskeletal: Normal range of motion. He exhibits no edema and no tenderness.  No effusion R knee  Neurological: He is alert and oriented to person, place, and time.  Skin: Skin is warm and dry. He is not diaphoretic.  Psychiatric: He has a normal mood and affect. His behavior is normal. Judgment and thought content normal.          Assessment & Plan:

## 2013-07-13 NOTE — Assessment & Plan Note (Addendum)
States got Zostavax at pharmacy. Will request records.   Requested handicap placard. States sometimes he cannot walk 200 feet without stopping to rest. Completed form.

## 2013-07-13 NOTE — Assessment & Plan Note (Signed)
Has had macroscopic in past, not currently. Has had microscopic as recently as 2014. Dr Jill Alexanders note in 05/2012 was "Cystoscopy and DRE did not reveal any cause of the hematuria." I cannot find cystoscopy report nor office notes and pt is not clear. Surgical hx list TURP in 2005 and there is supporting documents in [place]. I will start by getting old records.

## 2013-07-13 NOTE — Assessment & Plan Note (Signed)
Uses eye gtts.

## 2013-07-13 NOTE — Patient Instructions (Signed)
1. You are doing great. Your blood pressure is great. 2. See me in 6 months

## 2013-07-13 NOTE — Assessment & Plan Note (Signed)
Great control on HCTZ 12.5 and lisinopril 20. Checks BP at home and also well controlled. No lightheadedness or dizziness. Last BMP 5 months ago and good.

## 2013-07-13 NOTE — Assessment & Plan Note (Signed)
Normocytic. Baseline HgB about 12. Vit B [id number] 08/2010 - needs repeated. We discussed checking today vs next blood draw in fall and settled on checking next appt. Iron studies were basically nl.

## 2013-07-13 NOTE — Assessment & Plan Note (Signed)
He uses his ALB MDI up to 2 times weekly when the pollen falls off the trees. It causes him to cough that leads him to use the MDI. No SOB nor DOE.

## 2013-07-13 NOTE — Assessment & Plan Note (Addendum)
Reviewed PMH and updated overview.

## 2013-07-21 ENCOUNTER — Encounter: Payer: Medicare HMO | Admitting: Internal Medicine

## 2013-08-04 ENCOUNTER — Encounter: Payer: Self-pay | Admitting: Internal Medicine

## 2013-08-04 DIAGNOSIS — N401 Enlarged prostate with lower urinary tract symptoms: Secondary | ICD-10-CM | POA: Insufficient documentation

## 2013-08-04 DIAGNOSIS — N400 Enlarged prostate without lower urinary tract symptoms: Secondary | ICD-10-CM | POA: Insufficient documentation

## 2013-10-23 ENCOUNTER — Emergency Department (HOSPITAL_COMMUNITY)
Admission: EM | Admit: 2013-10-23 | Discharge: 2013-10-23 | Disposition: A | Discharge status: Home / Self Care | Payer: Medicare HMO | Attending: Emergency Medicine | Admitting: Emergency Medicine

## 2013-10-23 ENCOUNTER — Encounter (HOSPITAL_COMMUNITY): Payer: Self-pay | Admitting: Emergency Medicine

## 2013-10-23 ENCOUNTER — Emergency Department (HOSPITAL_COMMUNITY): Payer: Medicare HMO

## 2013-10-23 DIAGNOSIS — Z87448 Personal history of other diseases of urinary system: Secondary | ICD-10-CM | POA: Insufficient documentation

## 2013-10-23 DIAGNOSIS — Z7982 Long term (current) use of aspirin: Secondary | ICD-10-CM | POA: Insufficient documentation

## 2013-10-23 DIAGNOSIS — I10 Essential (primary) hypertension: Secondary | ICD-10-CM | POA: Diagnosis not present

## 2013-10-23 DIAGNOSIS — Z79899 Other long term (current) drug therapy: Secondary | ICD-10-CM | POA: Diagnosis not present

## 2013-10-23 DIAGNOSIS — Z8719 Personal history of other diseases of the digestive system: Secondary | ICD-10-CM | POA: Insufficient documentation

## 2013-10-23 DIAGNOSIS — Z8639 Personal history of other endocrine, nutritional and metabolic disease: Secondary | ICD-10-CM | POA: Insufficient documentation

## 2013-10-23 DIAGNOSIS — J45909 Unspecified asthma, uncomplicated: Secondary | ICD-10-CM | POA: Diagnosis not present

## 2013-10-23 DIAGNOSIS — Z862 Personal history of diseases of the blood and blood-forming organs and certain disorders involving the immune mechanism: Secondary | ICD-10-CM | POA: Insufficient documentation

## 2013-10-23 DIAGNOSIS — Z86711 Personal history of pulmonary embolism: Secondary | ICD-10-CM | POA: Diagnosis not present

## 2013-10-23 DIAGNOSIS — Z8659 Personal history of other mental and behavioral disorders: Secondary | ICD-10-CM | POA: Diagnosis not present

## 2013-10-23 DIAGNOSIS — Z8739 Personal history of other diseases of the musculoskeletal system and connective tissue: Secondary | ICD-10-CM | POA: Diagnosis not present

## 2013-10-23 DIAGNOSIS — R42 Dizziness and giddiness: Secondary | ICD-10-CM | POA: Insufficient documentation

## 2013-10-23 DIAGNOSIS — R0789 Other chest pain: Secondary | ICD-10-CM | POA: Insufficient documentation

## 2013-10-23 DIAGNOSIS — M1990 Unspecified osteoarthritis, unspecified site: Secondary | ICD-10-CM | POA: Insufficient documentation

## 2013-10-23 DIAGNOSIS — R55 Syncope and collapse: Secondary | ICD-10-CM | POA: Insufficient documentation

## 2013-10-23 LAB — I-STAT TROPONIN, ED
TROPONIN I, POC: 0 ng/mL (ref 0.00–0.08)
Troponin i, poc: 0 ng/mL (ref 0.00–0.08)

## 2013-10-23 LAB — COMPREHENSIVE METABOLIC PANEL
ALT: 12 U/L (ref 0–53)
ANION GAP: 11 (ref 5–15)
AST: 23 U/L (ref 0–37)
Albumin: 4.1 g/dL (ref 3.5–5.2)
Alkaline Phosphatase: 56 U/L (ref 39–117)
BILIRUBIN TOTAL: 0.4 mg/dL (ref 0.3–1.2)
BUN: 16 mg/dL (ref 6–23)
CALCIUM: 9.4 mg/dL (ref 8.4–10.5)
CO2: 27 meq/L (ref 19–32)
CREATININE: 1.23 mg/dL (ref 0.50–1.35)
Chloride: 97 mEq/L (ref 96–112)
GFR, EST AFRICAN AMERICAN: 66 mL/min — AB (ref >90)
GFR, EST NON AFRICAN AMERICAN: 57 mL/min — AB (ref >90)
GLUCOSE: 76 mg/dL (ref 70–99)
Potassium: 3.9 mEq/L (ref 3.7–5.3)
Sodium: 135 mEq/L — ABNORMAL LOW (ref 137–147)
Total Protein: 8 g/dL (ref 6.0–8.3)

## 2013-10-23 LAB — CBC WITH DIFFERENTIAL/PLATELET
BASOS ABS: 0.1 10*3/uL (ref 0.0–0.1)
Basophils Relative: 1 % (ref 0–1)
EOS PCT: 6 % — AB (ref 0–5)
Eosinophils Absolute: 0.4 10*3/uL (ref 0.0–0.7)
HEMATOCRIT: 42.6 % (ref 39.0–52.0)
HEMOGLOBIN: 14.9 g/dL (ref 13.0–17.0)
LYMPHS PCT: 43 % (ref 12–46)
Lymphs Abs: 2.9 10*3/uL (ref 0.7–4.0)
MCH: 28 pg (ref 26.0–34.0)
MCHC: 35 g/dL (ref 30.0–36.0)
MCV: 79.9 fL (ref 78.0–100.0)
MONO ABS: 0.4 10*3/uL (ref 0.1–1.0)
MONOS PCT: 7 % (ref 3–12)
Neutro Abs: 2.8 10*3/uL (ref 1.7–7.7)
Neutrophils Relative %: 43 % (ref 43–77)
Platelets: 246 10*3/uL (ref 150–400)
RBC: 5.33 MIL/uL (ref 4.22–5.81)
RDW: 13.8 % (ref 11.5–15.5)
WBC: 6.5 10*3/uL (ref 4.0–10.5)

## 2013-10-23 LAB — URINALYSIS, ROUTINE W REFLEX MICROSCOPIC
Bilirubin Urine: NEGATIVE
GLUCOSE, UA: NEGATIVE mg/dL
Hgb urine dipstick: NEGATIVE
Ketones, ur: NEGATIVE mg/dL
LEUKOCYTES UA: NEGATIVE
NITRITE: NEGATIVE
PH: 5 (ref 5.0–8.0)
Protein, ur: NEGATIVE mg/dL
Specific Gravity, Urine: 1.013 (ref 1.005–1.030)
Urobilinogen, UA: 0.2 mg/dL (ref 0.0–1.0)

## 2013-10-23 LAB — LIPASE, BLOOD: LIPASE: 62 U/L — AB (ref 11–59)

## 2013-10-23 MED ORDER — SODIUM CHLORIDE 0.9 % IV BOLUS (SEPSIS)
1000.0000 mL | Freq: Once | INTRAVENOUS | Status: AC
  Administered 2013-10-23: 1000 mL via INTRAVENOUS

## 2013-10-23 MED ORDER — IOHEXOL 350 MG/ML SOLN
80.0000 mL | Freq: Once | INTRAVENOUS | Status: AC | PRN
  Administered 2013-10-23: 80 mL via INTRAVENOUS

## 2013-10-23 MED ORDER — POLYETHYLENE GLYCOL 3350 17 G PO PACK: 17.0000 g | PACK | Freq: Every day | ORAL | Status: DC

## 2013-10-23 NOTE — ED Notes (Signed)
Patient returned from CT

## 2013-10-23 NOTE — ED Notes (Signed)
Pt complains of dizziness and racing heart. Denies fainting/loc. Denies any chest pain. MD at bedside.

## 2013-10-23 NOTE — ED Provider Notes (Addendum)
CSN: [id number]     Arrival date & time 10/23/13  1001 History   First MD Initiated Contact with Patient 10/23/13 1008     Chief Complaint  Patient presents with  . Dizziness     (Consider location/radiation/quality/duration/timing/severity/associated sxs/prior Treatment) The history is provided by the patient.  [name] is a 73 y.o. male hx of HTN, BPH, HL, here with lightheadedness and near syncope. He has been feeling light headed and dizzy for the last 3 days. Denies vertigo. Feels like he is going to pass out but didn't pass out. Has some intermittent left sided chest pain that is pleuritic. Also had some palpitations this morning. Intermittent epigastric pain as well. Denies vomiting or diarrhea or fevers. Was admitted in 2008 for syncope and was diagnosed with PE.    Past Medical History  Diagnosis Date  . HTN (hypertension)   . BPH (benign prostatic hyperplasia)   . History of hypercholesterolemia   . Other chest pain     Adenosine myoview in 2004 showing EF 52% with no evidence for ischemia.  There was a basal inferior fixed defect that my have been artifact.  . Hypercholesterolemia   . Diverticulitis     colonoscopy in 12/05  . Osteoarthritis     Primarily left knee, acute right knee swelling in 9/07  . Colitis     3-4 ulcers on colonoscopy in 12/05  . Wrist disorder     Proximal carpo-scapholunate dissosiation -seen by Dr. Derryl Harbor  . Depression   . Gastritis   . Syncope     Suspect neurocardiogenic.  Always with standing or micturation.  Had bradycardic/hypotensive response with standing when hospitalized in 1/12.  On Florinef.  Echo (1/12): EF 60-65%, mild aortic root dilation.   . Aortic root dilatation 2012    Seen on Echo in 2012.  17mm  . Asthma, chronic 07/13/2013    PFT's 05/2012 : FEV1 / FVC 75 and 78 pre and post BD. FEV1 74 and 75 pre and post BD. TLC 72. RV 93. DLCO 79. Read as restrictive lung disease, possible with obstructive lung disease. Prior  smoker.    . PE (pulmonary thromboembolism) 09/13/2006    In 2008. On Warfarin until 01/2010 when he was admitted for syncope and noticed that he had completed course of warfarin and it was stopped.    Past Surgical History  Procedure Laterality Date  . Transurethral resection of prostate  3/05    [name]   Family History  Problem Relation Age of Onset  . Heart disease Father   . Stroke Father   . Heart disease Brother 87   History  Substance Use Topics  . Smoking status: Former Research scientist (life sciences)  . Smokeless tobacco: Not on file  . Alcohol Use: No    Review of Systems  Neurological: Positive for dizziness.  All other systems reviewed and are negative.     Allergies  Review of patient's allergies indicates no known allergies.  Home Medications   Prior to Admission medications   Medication Sig Start Date End Date Taking? Authorizing Provider  albuterol (PROVENTIL HFA;VENTOLIN HFA) 108 (90 BASE) MCG/ACT inhaler Inhale 2 puffs into the lungs every 6 (six) hours as needed for wheezing or shortness of breath. 02/04/13  Yes [name], MD  aspirin EC 81 MG tablet Take 81 mg by mouth every evening.   Yes Historical Provider, MD  hydrochlorothiazide (MICROZIDE) 12.5 MG capsule Take 1 capsule (12.5 mg total) by mouth daily. 07/13/13 07/13/14  Yes Bartholomew Crews, MD  latanoprost (XALATAN) 0.005 % ophthalmic solution Place 1 drop into both eyes at bedtime.   Yes Historical Provider, MD  lisinopril (PRINIVIL,ZESTRIL) 20 MG tablet Take 20 mg by mouth every evening.   Yes Historical Provider, MD   BP 128/65  Pulse 53  Temp(Src) 98 F (36.7 C) (Oral)  Resp 17  SpO2 100% Physical Exam  Nursing note and vitals reviewed. Constitutional: He is oriented to person, place, and time.  Chronically ill, NAD   HENT:  Head: Normocephalic.  Mouth/Throat: Oropharynx is clear and moist.  Eyes: Conjunctivae and EOM are normal. Pupils are equal, round, and reactive to light.  Neck: Normal range of  motion. Neck supple.  Cardiovascular: Normal rate, regular rhythm and normal heart sounds.   Pulmonary/Chest: Effort normal and breath sounds normal. No respiratory distress. He has no wheezes. He has no rales.  Abdominal: Soft. Bowel sounds are normal. He exhibits no distension. There is no tenderness. There is no rebound.  Musculoskeletal: Normal range of motion. He exhibits no edema and no tenderness.  Neurological: He is alert and oriented to person, place, and time. No cranial nerve deficit. Coordination normal.  Skin: Skin is warm and dry.  Psychiatric: He has a normal mood and affect. His behavior is normal. Judgment and thought content normal.    ED Course  Procedures (including critical care time) Labs Review Labs Reviewed  CBC WITH DIFFERENTIAL - Abnormal; Notable for the following:    Eosinophils Relative 6 (*)    All other components within normal limits  COMPREHENSIVE METABOLIC PANEL - Abnormal; Notable for the following:    Sodium 135 (*)    GFR calc non Af Amer 57 (*)    GFR calc Af Amer 66 (*)    All other components within normal limits  LIPASE, BLOOD - Abnormal; Notable for the following:    Lipase 62 (*)    All other components within normal limits  URINE CULTURE  URINALYSIS, ROUTINE W REFLEX MICROSCOPIC  I-STAT TROPOININ, ED  I-STAT TROPOININ, ED    Imaging Review Ct Angio Chest Pe W/cm &/or Wo Cm  10/23/2013   CLINICAL DATA:  Chest pain.  EXAM: CT ANGIOGRAPHY CHEST WITH CONTRAST  TECHNIQUE: Multidetector CT imaging of the chest was performed using the standard protocol during bolus administration of intravenous contrast. Multiplanar CT image reconstructions and MIPs were obtained to evaluate the vascular anatomy.  CONTRAST:  1mL OMNIPAQUE IOHEXOL 350 MG/ML SOLN  COMPARISON:  10/23/2013.  FINDINGS: Pulmonary arteries are normal. No pulmonary embolus. Stable diffuse aortic ectasia. Maximum diameter of the ascending aorta is 4.6 cm. This is stable. Heart size is  stable.  Shotty mediastinal lymph nodes.  Thoracic esophagus unremarkable.  Large airways are patent. No significant pulmonary infiltrate. No pleural effusion or pneumothorax.  Are simple left renal cyst. Gallbladder mildly distended.Visualized upper abdominal structures otherwise unremarkable.  Thyroid unremarkable. No significant axillary adenopathy. There are are shotty axillary lymph nodes present. Chest wall is unremarkable. Degenerative changes thoracic spine.  Review of the MIP images confirms the above findings.  IMPRESSION: 1. No evidence pulmonary embolus. 2. Stable diffuse thoracic aortic ectasia with maximum diameter of the ascending aorta to 4.6 cm. 3. Mild distention of the gallbladder.   Electronically Signed   By: Marcello Moores  Register   On: 10/23/2013 12:32   Dg Abd Acute W/chest  10/23/2013   CLINICAL DATA:  Left chest pain.  Diarrhea and epigastric pain.  EXAM: ACUTE ABDOMEN SERIES (ABDOMEN  2 VIEW & CHEST 1 VIEW)  COMPARISON:  Chest radiograph 04/05/2012  FINDINGS: Chest radiograph demonstrates clear lungs. Normal appearance of the heart. No evidence for free air. Gas and stool in the colon. Nonobstructive bowel gas pattern. No large abdominal calcifications.  IMPRESSION: No acute chest abnormality.  Nonspecific bowel gas pattern. Small-moderate stool burden in the abdomen.   Electronically Signed   By: Markus Daft M.D.   On: 10/23/2013 11:29     EKG Interpretation   Date/Time:  Saturday October 23 2013 10:17:53 EDT Ventricular Rate:  60 PR Interval:  201 QRS Duration: 100 QT Interval:  434 QTC Calculation: 434 R Axis:   13 Text Interpretation:  Sinus rhythm Abnormal R-wave progression, early  transition No significant change since last tracing Confirmed by Raif Chachere  MD,  Aniayah Alaniz ([id number]) on 10/23/2013 10:26:44 AM      MDM   Final diagnoses:  None    [name] is a 73 y.o. male here with near syncope, chest pain. Given hx of PE will get CT angio. Consider ACS as well but he  does have hx of neurocardiogenic syncope. Abdomen soft and nontender so will check labs and hold off on CT ab/pel.   2:13 PM CT showed no PE. Not orthostatic, but felt better with IVF. Xray ab showed mod stool burden. Lipase slightly elevated, which is nonspecific but CT showed no obvious pancreatitis. Gallbladder mildly distended on CT but LFTs nl and no RUQ tenderness. Will d/c home with miralax, hydration. There is incidental ascending aortic aneurysm 4.5 cm that is stable. Will have him f/u with internal medicine clinic. I messaged his providers on epic.      Wandra Arthurs, MD 10/23/13 Delta Sakina Briones, MD 10/23/13 [phone]

## 2013-10-23 NOTE — Discharge Instructions (Signed)
Stay hydrated.   Take miralax daily until you have a good bowel movement then stop.   Follow up with your doctor.   Return to ER if you have severe abdominal pain, vomiting, dehydration, chest pain, passing out.

## 2013-10-24 ENCOUNTER — Encounter: Payer: Self-pay | Admitting: Internal Medicine

## 2013-10-24 LAB — URINE CULTURE
Colony Count: NO GROWTH
Culture: NO GROWTH

## 2013-11-05 ENCOUNTER — Ambulatory Visit (INDEPENDENT_AMBULATORY_CARE_PROVIDER_SITE_OTHER): Payer: Medicare HMO | Admitting: Internal Medicine

## 2013-11-05 ENCOUNTER — Encounter: Payer: Self-pay | Admitting: Internal Medicine

## 2013-11-05 ENCOUNTER — Ambulatory Visit (HOSPITAL_COMMUNITY)
Admission: RE | Admit: 2013-11-05 | Discharge: 2013-11-05 | Disposition: A | Discharge status: Home / Self Care | Payer: Medicare HMO | Source: Ambulatory Visit | Attending: Internal Medicine | Admitting: Internal Medicine

## 2013-11-05 VITALS — BP 130/70 | HR 81 | Temp 98.2°F | Ht 70.0 in | Wt 197.8 lb

## 2013-11-05 DIAGNOSIS — M25561 Pain in right knee: Secondary | ICD-10-CM

## 2013-11-05 DIAGNOSIS — I10 Essential (primary) hypertension: Secondary | ICD-10-CM

## 2013-11-05 DIAGNOSIS — M179 Osteoarthritis of knee, unspecified: Secondary | ICD-10-CM | POA: Insufficient documentation

## 2013-11-05 DIAGNOSIS — M25562 Pain in left knee: Secondary | ICD-10-CM

## 2013-11-05 DIAGNOSIS — M1710 Unilateral primary osteoarthritis, unspecified knee: Secondary | ICD-10-CM | POA: Insufficient documentation

## 2013-11-05 HISTORY — DX: Osteoarthritis of knee, unspecified: M17.9

## 2013-11-05 NOTE — Patient Instructions (Signed)
General Instructions: Please cont with your medications as before  I will send you over for x ray of your knees  Please take some tylenol for pain in you knees as needed.   Please bring your medicines with you each time you come to clinic.  Medicines may include prescription medications, over-the-counter medications, herbal remedies, eye drops, vitamins, or other pills.   Progress Toward Treatment Goals:  Treatment Goal 11/05/2013  Blood pressure at goal    Self Care Goals & Plans:  Self Care Goal 11/05/2013  Manage my medications take my medicines as prescribed; bring my medications to every visit; refill my medications on time  Monitor my health -  Eat healthy foods -  Be physically active -  Meeting treatment goals -    No flowsheet data found.   Care Management & Community Referrals:  Referral 11/05/2013  Referrals made for care management support none needed

## 2013-11-06 NOTE — Assessment & Plan Note (Signed)
BP Readings from Last 3 Encounters:  11/05/13 130/70  10/23/13 132/63  07/13/13 123/81    Lab Results  Component Value Date   NA 135* 10/23/2013   K 3.9 10/23/2013   CREATININE 1.23 10/23/2013    Assessment: Blood pressure control: controlled Progress toward BP goal:  at goal Comments: well controlled with Lisinopril 20 mg daily and HCTZ 12.5 mg daily.   Plan: Medications:  continue current medications Educational resources provided:   Self management tools provided:   Other plans: routine follow up

## 2013-11-06 NOTE — Assessment & Plan Note (Addendum)
Assessment: Most likely diagnosis is osteoarthritis in view of advance age and x ray finding indication mild joint degeneration.  Ddx: No septic arthritis. I do not suspect RA.   Plan: 1. Labs/imaging: Xray of both knees>> mild knee joint degeneration bilaterally 2. Therapy: I encouraged him to use OTC Tylenol for now.  3. Follow up: Follow up with PCP or if pain is not adequately controlled with Tylenol. NSAIDs can be considered.

## 2013-11-06 NOTE — Progress Notes (Signed)
Patient ID: [name], male   DOB: 27-Sep-1940, 73 y.o.   MRN: [id number]   Subjective:   HPI: [name] is a 73 y.o. man w/ PMH PE in '08 (off coumadin), OA, and HTN.   Reason(s) for this visit: 1. Ed follow up for near syncope: He was seen on ED on 9/26 for a near syncope episode. A CTA of chest was negative for PE. He was discharged home after he got better with IVF. Today he feels well. No recurrence of dizziness and no cardiopulmonary symptoms. Daughter accompanies him to clinic. 2. Bilateral Knee Pain: Pt complaining of knee pain for several months. Rates pain as 5/10 and tends to worsen when he tries to stand up from a seated position. Pain is sharp but not present at rest. Knees do not buckle under him. He has x ray of his knees from 2002 which were normal. No fevers no other joint involvement. No fevers.   Please see the A&P for the status of the pt's chronic medical problems.  ROS: Constitutional: Denies fever, chills, diaphoresis, appetite change and fatigue.  Respiratory: Denies SOB, DOE, cough, chest tightness, and wheezing. Denies chest pain. CVS: No chest pain, palpitations and leg swelling.  GI: No abdominal pain, nausea, vomiting, bloody stools GU: No dysuria, frequency, hematuria, or flank pain.   Psych: No depression symptoms. No SI or SA.    Objective:  Physical Exam: Filed Vitals:   11/05/13 1506  BP: 130/70  Pulse: 81  Temp: 98.2 F (36.8 C)  TempSrc: Oral  Height: 5\' 10"  (1.778 m)  Weight: 197 lb 12.8 oz (89.721 kg)  SpO2: 100%   General: Well nourished. No acute distress. Daughter in room.  HEENT: Normal oral mucosa. MMM.  Lungs: CTA bilaterally. Heart: RRR; no extra sounds or murmurs  Abdomen: Non-distended, normal bowel sounds, soft, nontender; no hepatosplenomegaly  Extremities: There is tenderness in the knees bilaterally.Left > Right. No evidence of fluid accumulation. No joint instability. No joint swelling and no crepitus.  No  pedal edema. No joint swelling or tenderness. Neurologic: Normal EOM,  Alert and oriented x3. No obvious neurologic/cranial nerve deficits.  Assessment & Plan:  Discussed case with my attending in the clinic, Dr. Lynnae January See problem based charting.

## 2013-11-08 NOTE — Progress Notes (Signed)
Internal Medicine Clinic Attending  Case discussed with Dr. Kazibwe soon after the resident saw the patient.  We reviewed the resident's history and exam and pertinent patient test results.  I agree with the assessment, diagnosis, and plan of care documented in the resident's note. 

## 2014-02-03 ENCOUNTER — Ambulatory Visit (INDEPENDENT_AMBULATORY_CARE_PROVIDER_SITE_OTHER): Payer: Commercial Managed Care - HMO | Admitting: Internal Medicine

## 2014-02-03 VITALS — BP 108/76 | HR 63 | Temp 98.3°F | Wt 196.4 lb

## 2014-02-03 DIAGNOSIS — Z23 Encounter for immunization: Secondary | ICD-10-CM

## 2014-02-03 DIAGNOSIS — J4520 Mild intermittent asthma, uncomplicated: Secondary | ICD-10-CM

## 2014-02-03 DIAGNOSIS — R319 Hematuria, unspecified: Secondary | ICD-10-CM

## 2014-02-03 DIAGNOSIS — I10 Essential (primary) hypertension: Secondary | ICD-10-CM | POA: Diagnosis not present

## 2014-02-03 DIAGNOSIS — M170 Bilateral primary osteoarthritis of knee: Secondary | ICD-10-CM | POA: Diagnosis not present

## 2014-02-03 DIAGNOSIS — D6489 Other specified anemias: Secondary | ICD-10-CM

## 2014-02-03 DIAGNOSIS — E663 Overweight: Secondary | ICD-10-CM

## 2014-02-03 DIAGNOSIS — E538 Deficiency of other specified B group vitamins: Secondary | ICD-10-CM | POA: Diagnosis not present

## 2014-02-03 DIAGNOSIS — Z0000 Encounter for general adult medical examination without abnormal findings: Secondary | ICD-10-CM

## 2014-02-03 LAB — BASIC METABOLIC PANEL WITH GFR
BUN: 13 mg/dL (ref 6–23)
CALCIUM: 9.5 mg/dL (ref 8.4–10.5)
CO2: 28 mEq/L (ref 19–32)
Chloride: 99 mEq/L (ref 96–112)
Creat: 1.36 mg/dL — ABNORMAL HIGH (ref 0.50–1.35)
GFR, Est African American: 60 mL/min
GFR, Est Non African American: 52 mL/min — ABNORMAL LOW
Glucose, Bld: 67 mg/dL — ABNORMAL LOW (ref 70–99)
POTASSIUM: 3.9 meq/L (ref 3.5–5.3)
Sodium: 135 mEq/L (ref 135–145)

## 2014-02-03 LAB — VITAMIN B12: Vitamin B-12: 261 pg/mL (ref 211–911)

## 2014-02-03 LAB — LIPID PANEL
CHOLESTEROL: 161 mg/dL (ref 0–200)
HDL: 37 mg/dL — AB (ref >39)
LDL Cholesterol: 103 mg/dL — ABNORMAL HIGH (ref 0–99)
Total CHOL/HDL Ratio: 4.4 Ratio
Triglycerides: 105 mg/dL (ref <150)
VLDL: 21 mg/dL (ref 0–40)

## 2014-02-03 NOTE — Assessment & Plan Note (Signed)
Repeat BP great on HCTZ 12.5 and lisinopril 20. Cont meds.  BP Readings from Last 3 Encounters:  02/03/14 108/76  11/05/13 130/70  10/23/13 132/63

## 2014-02-03 NOTE — Patient Instructions (Signed)
I will mail you your test results See me in 6 months

## 2014-02-03 NOTE — Progress Notes (Signed)
   Subjective:    Patient ID: [name], male    DOB: January 25, 1941, 74 y.o.   MRN: [id number]  HPI  Please see the A&P for the status of the pt's chronic medical problems.  Review of Systems  Constitutional: Negative for activity change, appetite change and unexpected weight change.  Eyes: Negative for visual disturbance.  Cardiovascular: Negative for chest pain.  Genitourinary: Negative for hematuria.  Musculoskeletal: Positive for arthralgias.  Neurological: Positive for dizziness.       In AM only       Objective:   Physical Exam  Constitutional: He is oriented to person, place, and time. He appears well-developed and well-nourished. No distress.  HENT:  Head: Normocephalic and atraumatic.  Right Ear: External ear normal.  Left Ear: External ear normal.  Nose: Nose normal.  Eyes: Conjunctivae and EOM are normal.  Cardiovascular: Normal rate, regular rhythm and normal heart sounds.   Pulmonary/Chest: Breath sounds normal.  Musculoskeletal: He exhibits edema.  Neurological: He is alert and oriented to person, place, and time.  Skin: Skin is warm and dry. He is not diaphoretic.  Psychiatric: He has a normal mood and affect. His behavior is normal. Judgment and thought content normal.          Assessment & Plan:

## 2014-02-03 NOTE — Assessment & Plan Note (Signed)
Vit B 12 low nl in past. Repeat today.

## 2014-02-04 NOTE — Assessment & Plan Note (Addendum)
Flu vaccine today. Pt states got shingles vaccine [employer] so will request records.  Cr up a bit today. Will need to recheck at 6 month F/U.

## 2014-02-04 NOTE — Assessment & Plan Note (Signed)
B "weak in knees" when awakes but eases off. We reviewed his plain films from ED. Tylenol PRN.

## 2014-02-04 NOTE — Assessment & Plan Note (Signed)
Have not been successful getting records. Last UA 2015 showed no hematuria and pt denies hematuria.

## 2014-02-04 NOTE — Assessment & Plan Note (Signed)
Uses ALB MDI prn. Only gets dyspnic when walks fast and then rests.

## 2014-02-07 ENCOUNTER — Encounter: Payer: Self-pay | Admitting: Internal Medicine

## 2014-02-27 ENCOUNTER — Encounter (HOSPITAL_COMMUNITY): Payer: Self-pay | Admitting: Cardiology

## 2014-02-27 ENCOUNTER — Emergency Department (HOSPITAL_COMMUNITY): Payer: Commercial Managed Care - HMO

## 2014-02-27 ENCOUNTER — Emergency Department (HOSPITAL_COMMUNITY)
Admission: EM | Admit: 2014-02-27 | Discharge: 2014-02-27 | Disposition: A | Discharge status: Home / Self Care | Payer: Commercial Managed Care - HMO | Attending: Emergency Medicine | Admitting: Emergency Medicine

## 2014-02-27 DIAGNOSIS — Y998 Other external cause status: Secondary | ICD-10-CM | POA: Insufficient documentation

## 2014-02-27 DIAGNOSIS — Y93K1 Activity, walking an animal: Secondary | ICD-10-CM | POA: Insufficient documentation

## 2014-02-27 DIAGNOSIS — Z8719 Personal history of other diseases of the digestive system: Secondary | ICD-10-CM | POA: Insufficient documentation

## 2014-02-27 DIAGNOSIS — Z87438 Personal history of other diseases of male genital organs: Secondary | ICD-10-CM | POA: Diagnosis not present

## 2014-02-27 DIAGNOSIS — Y9289 Other specified places as the place of occurrence of the external cause: Secondary | ICD-10-CM | POA: Diagnosis not present

## 2014-02-27 DIAGNOSIS — Z87891 Personal history of nicotine dependence: Secondary | ICD-10-CM | POA: Diagnosis not present

## 2014-02-27 DIAGNOSIS — X58XXXA Exposure to other specified factors, initial encounter: Secondary | ICD-10-CM | POA: Diagnosis not present

## 2014-02-27 DIAGNOSIS — I10 Essential (primary) hypertension: Secondary | ICD-10-CM | POA: Insufficient documentation

## 2014-02-27 DIAGNOSIS — J45909 Unspecified asthma, uncomplicated: Secondary | ICD-10-CM | POA: Insufficient documentation

## 2014-02-27 DIAGNOSIS — M19031 Primary osteoarthritis, right wrist: Secondary | ICD-10-CM | POA: Diagnosis not present

## 2014-02-27 DIAGNOSIS — M19041 Primary osteoarthritis, right hand: Secondary | ICD-10-CM | POA: Insufficient documentation

## 2014-02-27 DIAGNOSIS — S63501A Unspecified sprain of right wrist, initial encounter: Secondary | ICD-10-CM | POA: Diagnosis not present

## 2014-02-27 DIAGNOSIS — Z8659 Personal history of other mental and behavioral disorders: Secondary | ICD-10-CM | POA: Diagnosis not present

## 2014-02-27 DIAGNOSIS — Z8639 Personal history of other endocrine, nutritional and metabolic disease: Secondary | ICD-10-CM | POA: Diagnosis not present

## 2014-02-27 DIAGNOSIS — Z86711 Personal history of pulmonary embolism: Secondary | ICD-10-CM | POA: Diagnosis not present

## 2014-02-27 DIAGNOSIS — Z7982 Long term (current) use of aspirin: Secondary | ICD-10-CM | POA: Diagnosis not present

## 2014-02-27 DIAGNOSIS — S6991XA Unspecified injury of right wrist, hand and finger(s), initial encounter: Secondary | ICD-10-CM | POA: Diagnosis present

## 2014-02-27 MED ORDER — HYDROCODONE-ACETAMINOPHEN 5-325 MG PO TABS: ORAL_TABLET | ORAL | Status: DC

## 2014-02-27 NOTE — Discharge Instructions (Signed)
Please read and follow all provided instructions.  Your diagnoses today include:  1. Wrist sprain, right, initial encounter   2. Primary osteoarthritis of right hand    Tests performed today include:  An x-ray of the affected area - does NOT show any broken bones  Vital signs. See below for your results today.   Medications prescribed:   Vicodin (hydrocodone/acetaminophen) - narcotic pain medication  DO NOT drive or perform any activities that require you to be awake and alert because this medicine can make you drowsy. BE VERY CAREFUL not to take multiple medicines containing Tylenol (also called acetaminophen). Doing so can lead to an overdose which can damage your liver and cause liver failure and possibly death.  Use pain medication only under direct supervision at the lowest possible dose needed to control your pain.   Take any prescribed medications only as directed.  Home care instructions:   Follow any educational materials contained in this packet  Follow R.I.C.E. Protocol:  R - rest your injury   I  - use ice on injury without applying directly to skin  C - compress injury with bandage or splint  E - elevate the injury as much as possible  Follow-up instructions: Please follow-up with your primary care provider if you continue to have significant pain in 3 days. In this case you may have a more severe injury that requires further care.   Return instructions:   Please return if your fingers are numb or tingling, appear gray or blue, or you have severe pain (also elevate the arm and loosen splint or wrap if you were given one)  Please return to the Emergency Department if you experience worsening symptoms.   Please return if you have any other emergent concerns.  Additional Information:  Your vital signs today were: BP 92/69 mmHg   Pulse 80   Temp(Src) 97.4 F (36.3 C) (Oral)   Resp 18   Wt 196 lb (88.905 kg)   SpO2 100% If your blood pressure (BP) was  elevated above 135/85 this visit, please have this repeated by your doctor within one month. --------------

## 2014-02-27 NOTE — ED Notes (Signed)
Pt reports that he was pulling on his dogs leash and hurt his right wrist. Pt with swelling to the wrist.

## 2014-02-27 NOTE — ED Notes (Signed)
Declined W/C at D/C and was escorted to lobby by RN. 

## 2014-02-27 NOTE — ED Provider Notes (Signed)
CSN: [id number]     Arrival date & time 02/27/14  1032 History   First MD Initiated Contact with Patient 02/27/14 1049     Chief Complaint  Patient presents with  . Wrist Pain     (Consider location/radiation/quality/duration/timing/severity/associated sxs/prior Treatment) HPI Comments: Patient presents with complaint of right wrist pain and swelling starting yesterday morning. Patient states that he was walking his dogs 2 days ago and the dog's leash pulled on his hand. He did not have significant pain after this event until yesterday. Patient does have a history of osteoarthritis however states that his wrist does not usually swell this with osteoarthritis. He took Tylenol prior to arrival with some relief. No numbness or tingling. No pain in his elbow or shoulder.  Patient is a 74 y.o. male presenting with wrist pain. The history is provided by the patient.  Wrist Pain Associated symptoms include arthralgias and joint swelling. Pertinent negatives include no neck pain, numbness or weakness.    Past Medical History  Diagnosis Date  . HTN (hypertension)   . BPH (benign prostatic hyperplasia)   . History of hypercholesterolemia   . Other chest pain     Adenosine myoview in 2004 showing EF 52% with no evidence for ischemia.  There was a basal inferior fixed defect that my have been artifact.  . Hypercholesterolemia   . Diverticulitis     colonoscopy in 12/05  . Osteoarthritis     Primarily left knee, acute right knee swelling in 9/07  . Colitis     3-4 ulcers on colonoscopy in 12/05  . Wrist disorder     Proximal carpo-scapholunate dissosiation -seen by Dr. Derryl Harbor  . Depression   . Gastritis   . Syncope     Suspect neurocardiogenic.  Always with standing or micturation.  Had bradycardic/hypotensive response with standing when hospitalized in 1/12.  On Florinef.  Echo (1/12): EF 60-65%, mild aortic root dilation.   . Aortic root dilatation 2012    Seen on Echo in 2012.  81mm  .  Asthma, chronic 07/13/2013    PFT's 05/2012 : FEV1 / FVC 75 and 78 pre and post BD. FEV1 74 and 75 pre and post BD. TLC 72. RV 93. DLCO 79. Read as restrictive lung disease, possible with obstructive lung disease. Prior smoker.    . PE (pulmonary thromboembolism) 09/13/2006    In 2008. On Warfarin until 01/2010 when he was admitted for syncope and noticed that he had completed course of warfarin and it was stopped.    Past Surgical History  Procedure Laterality Date  . Transurethral resection of prostate  3/05    [name]   Family History  Problem Relation Age of Onset  . Heart disease Father   . Stroke Father   . Heart disease Brother 36   History  Substance Use Topics  . Smoking status: Former Research scientist (life sciences)  . Smokeless tobacco: Not on file  . Alcohol Use: No    Review of Systems  Constitutional: Negative for activity change.  Musculoskeletal: Positive for joint swelling and arthralgias. Negative for back pain, gait problem and neck pain.  Skin: Negative for wound.  Neurological: Negative for weakness and numbness.    Allergies  Review of patient's allergies indicates no known allergies.  Home Medications   Prior to Admission medications   Medication Sig Start Date End Date Taking? Authorizing Provider  albuterol (PROVENTIL HFA;VENTOLIN HFA) 108 (90 BASE) MCG/ACT inhaler Inhale 2 puffs into the lungs every 6 (six) hours  as needed for wheezing or shortness of breath. 02/04/13   [name], MD  aspirin EC 81 MG tablet Take 81 mg by mouth every evening.    Historical Provider, MD  hydrochlorothiazide (MICROZIDE) 12.5 MG capsule Take 1 capsule (12.5 mg total) by mouth daily. 07/13/13 07/13/14  Bartholomew Crews, MD  latanoprost (XALATAN) 0.005 % ophthalmic solution Place 1 drop into both eyes at bedtime.    Historical Provider, MD  lisinopril (PRINIVIL,ZESTRIL) 20 MG tablet Take 20 mg by mouth every evening.    Historical Provider, MD  polyethylene glycol (MIRALAX / GLYCOLAX) packet  Take 17 g by mouth daily. 10/23/13   Wandra Arthurs, MD   BP 92/69 mmHg  Pulse 80  Temp(Src) 97.4 F (36.3 C) (Oral)  Resp 18  Wt 196 lb (88.905 kg)  SpO2 100%   Physical Exam  Constitutional: He appears well-developed and well-nourished.  HENT:  Head: Normocephalic and atraumatic.  Eyes: Conjunctivae are normal.  Neck: Normal range of motion. Neck supple.  Cardiovascular: Normal pulses.   Pulses:      Radial pulses are 2+ on the right side, and 2+ on the left side.  Musculoskeletal: He exhibits edema and tenderness.       Right shoulder: Normal.       Right elbow: Normal.      Right wrist: He exhibits decreased range of motion, tenderness, bony tenderness and swelling.       Right forearm: Normal.       Right hand: He exhibits decreased range of motion. He exhibits no tenderness and no bony tenderness. Normal sensation noted.  Neurological: He is alert. No sensory deficit.  Motor, sensation, and vascular distal to the injury is fully intact.   Skin: Skin is warm and dry.  Psychiatric: He has a normal mood and affect.  Nursing note and vitals reviewed.   ED Course  Procedures (including critical care time) Labs Review Labs Reviewed - No data to display  Imaging Review Dg Forearm Right  02/27/2014   CLINICAL DATA:  Right wrist pain and swelling beginning the morning of 02/26/2014.  EXAM: RIGHT FOREARM - 2 VIEW  COMPARISON:  None.  FINDINGS: No acute bony or joint abnormality is identified. Marked degenerative change is seen about the carpus with volar tilt of the lunate and proximal migration of the capitate identified (SLAC change.  IMPRESSION: No acute abnormality.  Marked degenerative disease about the carpus with SLAC wrist identified.   Electronically Signed   By: Inge Rise M.D.   On: 02/27/2014 11:52   Dg Wrist Complete Right  02/27/2014   CLINICAL DATA:  Acute right wrist pain and swelling without known injury. Initial encounter.  EXAM: RIGHT WRIST - COMPLETE 3+  VIEW  COMPARISON:  August 04, 2007.  FINDINGS: No definite fracture or dislocation is noted. Degenerative change of the radiocarpal joint is again noted. Degenerative change of intercarpal joints is also noted.  IMPRESSION: Degenerative changes seen involving the radiocarpal and intercarpal joints. No fracture or dislocation is noted.   Electronically Signed   By: Sabino Dick M.D.   On: 02/27/2014 11:51     EKG Interpretation None       11:13 AM Patient seen and examined. X-ray pending.    Vital signs reviewed and are as follows: BP 92/69 mmHg  Pulse 80  Temp(Src) 97.4 F (36.3 C) (Oral)  Resp 18  Wt 196 lb (88.905 kg)  SpO2 100%  12:03 PM Patient seen by Dr.  Lockwood. Patient and family informed of x-ray results. No fracture. Will give velcro splint, pain medication, PCP follow-up this week.  Discussed with patient and family at bedside that if taking stronger pain medication, he should take 1/2-1 tablet only and use under direct supervision. We discussed fall risk and side effects of narcotic pain medication.  MDM   Final diagnoses:  Wrist sprain, right, initial encounter  Primary osteoarthritis of right hand   Patient with wrist/hand pain and swelling. He has significant osteoarthritis. No fractures. No sign of overlying infection. Do not suspect septic arthritis. Will immobilize and give pain medication. Discussed pain medication precautions with patient and family. PCP follow-up in 3 days if not feeling better.    Carlisle Cater, PA-C 02/27/14 [place], MD 02/27/14 [phone]

## 2014-03-03 ENCOUNTER — Inpatient Hospital Stay (HOSPITAL_COMMUNITY)
Admission: EM | Admit: 2014-03-03 | Discharge: 2014-03-05 | DRG: 513 | Disposition: A | Discharge status: Home / Self Care | Payer: Commercial Managed Care - HMO | Attending: Internal Medicine | Admitting: Internal Medicine

## 2014-03-03 ENCOUNTER — Emergency Department (HOSPITAL_COMMUNITY): Payer: Commercial Managed Care - HMO

## 2014-03-03 ENCOUNTER — Encounter (HOSPITAL_COMMUNITY): Payer: Self-pay

## 2014-03-03 ENCOUNTER — Ambulatory Visit (INDEPENDENT_AMBULATORY_CARE_PROVIDER_SITE_OTHER): Payer: Commercial Managed Care - HMO | Admitting: Internal Medicine

## 2014-03-03 DIAGNOSIS — J4520 Mild intermittent asthma, uncomplicated: Secondary | ICD-10-CM | POA: Diagnosis not present

## 2014-03-03 DIAGNOSIS — L03011 Cellulitis of right finger: Secondary | ICD-10-CM | POA: Diagnosis not present

## 2014-03-03 DIAGNOSIS — Z86711 Personal history of pulmonary embolism: Secondary | ICD-10-CM

## 2014-03-03 DIAGNOSIS — I10 Essential (primary) hypertension: Secondary | ICD-10-CM | POA: Diagnosis present

## 2014-03-03 DIAGNOSIS — M25531 Pain in right wrist: Secondary | ICD-10-CM | POA: Diagnosis not present

## 2014-03-03 DIAGNOSIS — M109 Gout, unspecified: Secondary | ICD-10-CM | POA: Diagnosis not present

## 2014-03-03 DIAGNOSIS — M25431 Effusion, right wrist: Secondary | ICD-10-CM

## 2014-03-03 DIAGNOSIS — Z87891 Personal history of nicotine dependence: Secondary | ICD-10-CM

## 2014-03-03 DIAGNOSIS — M1712 Unilateral primary osteoarthritis, left knee: Secondary | ICD-10-CM | POA: Diagnosis present

## 2014-03-03 DIAGNOSIS — M659 Synovitis and tenosynovitis, unspecified: Secondary | ICD-10-CM

## 2014-03-03 DIAGNOSIS — M65841 Other synovitis and tenosynovitis, right hand: Principal | ICD-10-CM | POA: Diagnosis present

## 2014-03-03 DIAGNOSIS — I739 Peripheral vascular disease, unspecified: Secondary | ICD-10-CM | POA: Diagnosis not present

## 2014-03-03 DIAGNOSIS — Z7982 Long term (current) use of aspirin: Secondary | ICD-10-CM

## 2014-03-03 DIAGNOSIS — N179 Acute kidney failure, unspecified: Secondary | ICD-10-CM | POA: Diagnosis present

## 2014-03-03 DIAGNOSIS — N400 Enlarged prostate without lower urinary tract symptoms: Secondary | ICD-10-CM | POA: Diagnosis present

## 2014-03-03 DIAGNOSIS — J45909 Unspecified asthma, uncomplicated: Secondary | ICD-10-CM | POA: Diagnosis present

## 2014-03-03 DIAGNOSIS — L539 Erythematous condition, unspecified: Secondary | ICD-10-CM | POA: Diagnosis not present

## 2014-03-03 DIAGNOSIS — M779 Enthesopathy, unspecified: Secondary | ICD-10-CM | POA: Diagnosis present

## 2014-03-03 DIAGNOSIS — M6588 Other synovitis and tenosynovitis, other site: Secondary | ICD-10-CM | POA: Diagnosis not present

## 2014-03-03 DIAGNOSIS — L03113 Cellulitis of right upper limb: Secondary | ICD-10-CM | POA: Diagnosis not present

## 2014-03-03 DIAGNOSIS — M79644 Pain in right finger(s): Secondary | ICD-10-CM | POA: Diagnosis not present

## 2014-03-03 DIAGNOSIS — M65949 Unspecified synovitis and tenosynovitis, unspecified hand: Secondary | ICD-10-CM

## 2014-03-03 DIAGNOSIS — M19031 Primary osteoarthritis, right wrist: Secondary | ICD-10-CM | POA: Diagnosis not present

## 2014-03-03 DIAGNOSIS — F329 Major depressive disorder, single episode, unspecified: Secondary | ICD-10-CM | POA: Diagnosis present

## 2014-03-03 DIAGNOSIS — R229 Localized swelling, mass and lump, unspecified: Secondary | ICD-10-CM | POA: Diagnosis not present

## 2014-03-03 DIAGNOSIS — E780 Pure hypercholesterolemia: Secondary | ICD-10-CM | POA: Diagnosis not present

## 2014-03-03 DIAGNOSIS — Z8679 Personal history of other diseases of the circulatory system: Secondary | ICD-10-CM | POA: Diagnosis present

## 2014-03-03 DIAGNOSIS — M79641 Pain in right hand: Secondary | ICD-10-CM | POA: Diagnosis not present

## 2014-03-03 LAB — CBC WITH DIFFERENTIAL/PLATELET
Basophils Absolute: 0.1 10*3/uL (ref 0.0–0.1)
Basophils Relative: 1 % (ref 0–1)
EOS ABS: 0.2 10*3/uL (ref 0.0–0.7)
Eosinophils Relative: 3 % (ref 0–5)
HCT: 41.9 % (ref 39.0–52.0)
Hemoglobin: 14.5 g/dL (ref 13.0–17.0)
LYMPHS ABS: 2.7 10*3/uL (ref 0.7–4.0)
Lymphocytes Relative: 34 % (ref 12–46)
MCH: 27.7 pg (ref 26.0–34.0)
MCHC: 34.6 g/dL (ref 30.0–36.0)
MCV: 80 fL (ref 78.0–100.0)
Monocytes Absolute: 0.6 10*3/uL (ref 0.1–1.0)
Monocytes Relative: 7 % (ref 3–12)
NEUTROS PCT: 55 % (ref 43–77)
Neutro Abs: 4.4 10*3/uL (ref 1.7–7.7)
PLATELETS: 341 10*3/uL (ref 150–400)
RBC: 5.24 MIL/uL (ref 4.22–5.81)
RDW: 13.9 % (ref 11.5–15.5)
WBC: 8 10*3/uL (ref 4.0–10.5)

## 2014-03-03 LAB — BASIC METABOLIC PANEL
Anion gap: 10 (ref 5–15)
BUN: 22 mg/dL (ref 6–23)
CO2: 25 mmol/L (ref 19–32)
Calcium: 9.4 mg/dL (ref 8.4–10.5)
Chloride: 98 mmol/L (ref 96–112)
Creatinine, Ser: 1.33 mg/dL (ref 0.50–1.35)
GFR calc Af Amer: 60 mL/min — ABNORMAL LOW (ref >90)
GFR, EST NON AFRICAN AMERICAN: 52 mL/min — AB (ref >90)
Glucose, Bld: 88 mg/dL (ref 70–99)
Potassium: 4.7 mmol/L (ref 3.5–5.1)
Sodium: 133 mmol/L — ABNORMAL LOW (ref 135–145)

## 2014-03-03 LAB — SEDIMENTATION RATE: Sed Rate: 24 mm/hr — ABNORMAL HIGH (ref 0–16)

## 2014-03-03 MED ORDER — FOLIC ACID 1 MG PO TABS
1.0000 mg | ORAL_TABLET | Freq: Every day | ORAL | Status: DC
  Administered 2014-03-05: 1 mg via ORAL
  Filled 2014-03-03 (×2): qty 1

## 2014-03-03 MED ORDER — HEPARIN SODIUM (PORCINE) 5000 UNIT/ML IJ SOLN
5000.0000 [IU] | Freq: Three times a day (TID) | INTRAMUSCULAR | Status: DC
  Administered 2014-03-04 – 2014-03-05 (×3): 5000 [IU] via SUBCUTANEOUS
  Filled 2014-03-03 (×6): qty 1

## 2014-03-03 MED ORDER — ALBUTEROL SULFATE (2.5 MG/3ML) 0.083% IN NEBU: 2.5000 mg | INHALATION_SOLUTION | Freq: Four times a day (QID) | RESPIRATORY_TRACT | Status: DC | PRN

## 2014-03-03 MED ORDER — SODIUM CHLORIDE 0.9 % IV SOLN
INTRAVENOUS | Status: DC
  Administered 2014-03-03: via INTRAVENOUS

## 2014-03-03 MED ORDER — HYDROMORPHONE HCL 1 MG/ML IJ SOLN
1.0000 mg | Freq: Once | INTRAMUSCULAR | Status: AC
  Administered 2014-03-03: 1 mg via INTRAVENOUS
  Filled 2014-03-03: qty 1

## 2014-03-03 MED ORDER — HYDROCODONE-ACETAMINOPHEN 5-325 MG PO TABS: 1.0000 | ORAL_TABLET | Freq: Once | ORAL | Status: DC

## 2014-03-03 MED ORDER — ADULT MULTIVITAMIN W/MINERALS CH
1.0000 | ORAL_TABLET | Freq: Every day | ORAL | Status: DC
  Administered 2014-03-05: 1 via ORAL
  Filled 2014-03-03 (×2): qty 1

## 2014-03-03 MED ORDER — VANCOMYCIN HCL IN DEXTROSE 1-5 GM/200ML-% IV SOLN
1000.0000 mg | Freq: Once | INTRAVENOUS | Status: AC
  Administered 2014-03-03: 1000 mg via INTRAVENOUS
  Filled 2014-03-03: qty 200

## 2014-03-03 MED ORDER — HYDROCHLOROTHIAZIDE 12.5 MG PO CAPS
12.5000 mg | ORAL_CAPSULE | Freq: Every day | ORAL | Status: DC
  Filled 2014-03-03: qty 1

## 2014-03-03 MED ORDER — OXYCODONE HCL 5 MG PO TABS
5.0000 mg | ORAL_TABLET | ORAL | Status: DC | PRN
  Administered 2014-03-04 – 2014-03-05 (×4): 5 mg via ORAL
  Filled 2014-03-03 (×5): qty 1

## 2014-03-03 MED ORDER — IBUPROFEN 800 MG PO TABS
800.0000 mg | ORAL_TABLET | Freq: Once | ORAL | Status: AC
  Administered 2014-03-03: 800 mg via ORAL
  Filled 2014-03-03: qty 1

## 2014-03-03 MED ORDER — ACETAMINOPHEN 650 MG RE SUPP: 650.0000 mg | Freq: Four times a day (QID) | RECTAL | Status: DC | PRN

## 2014-03-03 MED ORDER — ONDANSETRON HCL 4 MG PO TABS: 4.0000 mg | ORAL_TABLET | Freq: Four times a day (QID) | ORAL | Status: DC | PRN

## 2014-03-03 MED ORDER — LISINOPRIL 20 MG PO TABS
20.0000 mg | ORAL_TABLET | Freq: Every evening | ORAL | Status: DC
  Filled 2014-03-03: qty 1

## 2014-03-03 MED ORDER — ONDANSETRON HCL 4 MG/2ML IJ SOLN: 4.0000 mg | Freq: Four times a day (QID) | INTRAMUSCULAR | Status: DC | PRN

## 2014-03-03 MED ORDER — POLYETHYLENE GLYCOL 3350 17 G PO PACK
17.0000 g | PACK | Freq: Every day | ORAL | Status: DC
  Administered 2014-03-05: 17 g via ORAL
  Filled 2014-03-03 (×2): qty 1

## 2014-03-03 MED ORDER — ACETAMINOPHEN 325 MG PO TABS
650.0000 mg | ORAL_TABLET | Freq: Four times a day (QID) | ORAL | Status: DC | PRN
Start: 2014-03-03 — End: 2014-03-05
  Administered 2014-03-04: 650 mg via ORAL
  Filled 2014-03-03: qty 2

## 2014-03-03 MED ORDER — VANCOMYCIN HCL IN DEXTROSE 750-5 MG/150ML-% IV SOLN
750.0000 mg | Freq: Two times a day (BID) | INTRAVENOUS | Status: DC
  Filled 2014-03-03 (×2): qty 150

## 2014-03-03 MED ORDER — VITAMIN B-1 100 MG PO TABS
100.0000 mg | ORAL_TABLET | Freq: Every day | ORAL | Status: DC
  Administered 2014-03-05: 100 mg via ORAL
  Filled 2014-03-03 (×2): qty 1

## 2014-03-03 MED ORDER — LATANOPROST 0.005 % OP SOLN
1.0000 [drp] | Freq: Every day | OPHTHALMIC | Status: DC
  Administered 2014-03-04 (×2): 1 [drp] via OPHTHALMIC
  Filled 2014-03-03: qty 2.5

## 2014-03-03 MED ORDER — PIPERACILLIN-TAZOBACTAM 3.375 G IVPB 30 MIN: 3.3750 g | Freq: Once | INTRAVENOUS | Status: DC

## 2014-03-03 MED ORDER — PIPERACILLIN-TAZOBACTAM 3.375 G IVPB
3.3750 g | Freq: Three times a day (TID) | INTRAVENOUS | Status: DC
  Administered 2014-03-04 – 2014-03-05 (×4): 3.375 g via INTRAVENOUS
  Filled 2014-03-03 (×7): qty 50

## 2014-03-03 NOTE — Progress Notes (Signed)
Taken to [facility] ER per Dr Algis Liming 2:45PM via w/c with belongings. Hilda Blades Aprel Egelhoff RN 03/03/14 2:45PM

## 2014-03-03 NOTE — Assessment & Plan Note (Signed)
The patient states it is been ongoing since 02/24/14 with little to no relief. The patient had presented to the ED and had x-rays complete at that time that showed no acute pathology or crystals. The patient has had worsening swelling and pain since that time that is not relieved with Percocet. This is a very atypical presentation or location for gout and there is still concern for septic joint.  Therefore the patient was sent to the ED for joint aspiration to be able to direct treatment

## 2014-03-03 NOTE — ED Provider Notes (Signed)
CSN: [id number]     Arrival date & time 03/03/14  1442 History   First MD Initiated Contact with Patient 03/03/14 1751     No chief complaint on file.    (Consider location/radiation/quality/duration/timing/severity/associated sxs/prior Treatment) HPI 74 year old male past history is below notable for osteoarthritis, history of carpal scapholunate dissociation who presents to ED from clinic for further evaluation of right wrist pain. Patient states right wrist has been bothering him for the last week. Patient denies having any trauma. He was seen in this ED on 1/31 and had x-rays of that time which were unremarkable. Patient was instructed to follow-up with his PCP and did so today who was concerned about this being a septic joint and advised to come to the ED for further evaluation. Patient reports over the last day his right pinky and wrists have increased in swelling as well as redness. He denies any fevers, chills, nausea, vomiting. Pain is rated 10/10. Palpation and movement worsened pain significantly. No history of septic joint in past. No other joints are bothering him. No GU symptoms. No other complaints at this time. Past Medical History  Diagnosis Date  . HTN (hypertension)   . BPH (benign prostatic hyperplasia)   . History of hypercholesterolemia   . Other chest pain     Adenosine myoview in 2004 showing EF 52% with no evidence for ischemia.  There was a basal inferior fixed defect that my have been artifact.  . Hypercholesterolemia   . Diverticulitis     colonoscopy in 12/05  . Osteoarthritis     Primarily left knee, acute right knee swelling in 9/07  . Colitis     3-4 ulcers on colonoscopy in 12/05  . Wrist disorder     Proximal carpo-scapholunate dissosiation -seen by Dr. Derryl Harbor  . Depression   . Gastritis   . Syncope     Suspect neurocardiogenic.  Always with standing or micturation.  Had bradycardic/hypotensive response with standing when hospitalized in 1/12.  On  Florinef.  Echo (1/12): EF 60-65%, mild aortic root dilation.   . Aortic root dilatation 2012    Seen on Echo in 2012.  36mm  . Asthma, chronic 07/13/2013    PFT's 05/2012 : FEV1 / FVC 75 and 78 pre and post BD. FEV1 74 and 75 pre and post BD. TLC 72. RV 93. DLCO 79. Read as restrictive lung disease, possible with obstructive lung disease. Prior smoker.    . PE (pulmonary thromboembolism) 09/13/2006    In 2008. On Warfarin until 01/2010 when he was admitted for syncope and noticed that he had completed course of warfarin and it was stopped.    Past Surgical History  Procedure Laterality Date  . Transurethral resection of prostate  3/05    [name]   Family History  Problem Relation Age of Onset  . Heart disease Father   . Stroke Father   . Heart disease Brother 76   History  Substance Use Topics  . Smoking status: Former Research scientist (life sciences)  . Smokeless tobacco: Not on file  . Alcohol Use: No    Review of Systems  Constitutional: Negative for fever, activity change and appetite change.  HENT: Negative for congestion, rhinorrhea and sore throat.   Eyes: Negative for visual disturbance.  Respiratory: Negative for cough and shortness of breath.   Cardiovascular: Negative for chest pain, palpitations and leg swelling.  Gastrointestinal: Negative for nausea, vomiting, abdominal pain and diarrhea.  Genitourinary: Negative for dysuria, flank pain, decreased urine volume and  difficulty urinating.  Musculoskeletal: Positive for joint swelling and arthralgias. Negative for back pain and neck pain.  Skin: Negative for rash.  Neurological: Negative for dizziness, syncope, speech difficulty, weakness, light-headedness, numbness and headaches.  Psychiatric/Behavioral: Negative for confusion.      Allergies  Review of patient's allergies indicates no known allergies.  Home Medications   Prior to Admission medications   Medication Sig Start Date End Date Taking? Authorizing Provider  albuterol  (PROVENTIL HFA;VENTOLIN HFA) 108 (90 BASE) MCG/ACT inhaler Inhale 2 puffs into the lungs every 6 (six) hours as needed for wheezing or shortness of breath. 02/04/13   [name], MD  aspirin EC 81 MG tablet Take 81 mg by mouth every evening.    Historical Provider, MD  hydrochlorothiazide (MICROZIDE) 12.5 MG capsule Take 1 capsule (12.5 mg total) by mouth daily. 07/13/13 07/13/14  Bartholomew Crews, MD  HYDROcodone-acetaminophen (NORCO/VICODIN) 5-325 MG per tablet Take 1/2 - 1 tablets every 8 hours as needed for severe pain 02/27/14   Carlisle Cater, PA-C  latanoprost (XALATAN) 0.005 % ophthalmic solution Place 1 drop into both eyes at bedtime.    Historical Provider, MD  lisinopril (PRINIVIL,ZESTRIL) 20 MG tablet Take 20 mg by mouth every evening.    Historical Provider, MD  polyethylene glycol (MIRALAX / GLYCOLAX) packet Take 17 g by mouth daily. 10/23/13   Wandra Arthurs, MD   BP 91/60 mmHg  Pulse 69  Temp(Src) 98.8 F (37.1 C) (Oral)  Resp 18  SpO2 97% Physical Exam  Constitutional: He is oriented to person, place, and time. He appears well-developed and well-nourished. No distress.  HENT:  Head: Normocephalic and atraumatic.  Nose: Nose normal.  Mouth/Throat: Oropharynx is clear and moist. No oropharyngeal exudate.  Eyes: Conjunctivae and EOM are normal.  Neck: Normal range of motion. Neck supple. No JVD present.  Cardiovascular: Normal rate, regular rhythm, normal heart sounds and intact distal pulses.   Pulmonary/Chest: Effort normal and breath sounds normal. No respiratory distress.  Abdominal: Soft. He exhibits no distension. There is no tenderness. There is no rebound and no guarding.  Musculoskeletal:       Right wrist: He exhibits decreased range of motion, tenderness, bony tenderness and swelling (overlying dorsum of distal ulna).       Hands: Neurological: He is alert and oriented to person, place, and time. No cranial nerve deficit.  Skin: Skin is warm and dry. No rash noted.   Psychiatric: He has a normal mood and affect.  Nursing note and vitals reviewed.   ED Course  Procedures (including critical care time) Labs Review Labs Reviewed - No data to display  Imaging Review No results found.   EKG Interpretation None      MDM   Final diagnoses:  None    [name] is a 74 y.o. male with H&P as above. Patient arrives hemodynamically stable but in obvious distress. Patient has no fever. Right fifth digit is grossly swollen and tender along the flexor surface. Patient has pain with passive extension and flexion. Patient holds the finger in the flexed position. This raises concern for flexor tenosynovitis. Patient also has history of gout, but getting this clinical picture. Patient has never had gout in the hand. Patient also has a small amount of swelling on the dorsum of the right distal ulna region and decreased range of motion of the wrist. X-rays were done which are unremarkable for acute abnormality and otherwise show chronic changes. Screening labs are notable for  8000 white count and sedimentation rate of 24. Discussed case with hand surgeon, Dr. Lenon Curt, who advised starting patient on antibiotics and admission to hospitalist and he will see them in the morning. Patient was given IV Vanco and Zosyn and admitted to the hospitalist.  Pt seen in conjunction with Dr. Corky Sox, [facility] Resident - PGY-2     Kirstie Peri, MD 00/92/33 0076  Delora Fuel, MD 22/63/33 [id number]

## 2014-03-03 NOTE — H&P (Signed)
[facility] History and Physical  KWAKU MOSTAFA KVQ:[id number] DOB: 03/01/1940 DOA: 03/03/2014  Referring physician: Kirstie Peri, MD PCP: Larey Dresser, MD   Chief Complaint: Right hand finger pain  HPI: [name] is a 74 y.o. male presents with flexor tenosynovitis. Patient has a history of gout in the past and was actually seen today in the office for pain in his right wrist and little finger. It was suspected that he might have an infected joint and was sent to the ED. Patient was evaluated and hand surgery suggested admitting for IV antibiotics. Patient has severe pain in the wrist and also has noted increased warmth. Patient has had no fevers noted. Patient has no other complaints.   Review of Systems:  Complete ROS is unremarkable other than noted above in HPI  Past Medical History  Diagnosis Date  . HTN (hypertension)   . BPH (benign prostatic hyperplasia)   . History of hypercholesterolemia   . Other chest pain     Adenosine myoview in 2004 showing EF 52% with no evidence for ischemia.  There was a basal inferior fixed defect that my have been artifact.  . Hypercholesterolemia   . Diverticulitis     colonoscopy in 12/05  . Osteoarthritis     Primarily left knee, acute right knee swelling in 9/07  . Colitis     3-4 ulcers on colonoscopy in 12/05  . Wrist disorder     Proximal carpo-scapholunate dissosiation -seen by Dr. Derryl Harbor  . Depression   . Gastritis   . Syncope     Suspect neurocardiogenic.  Always with standing or micturation.  Had bradycardic/hypotensive response with standing when hospitalized in 1/12.  On Florinef.  Echo (1/12): EF 60-65%, mild aortic root dilation.   . Aortic root dilatation 2012    Seen on Echo in 2012.  64mm  . Asthma, chronic 07/13/2013    PFT's 05/2012 : FEV1 / FVC 75 and 78 pre and post BD. FEV1 74 and 75 pre and post BD. TLC 72. RV 93. DLCO 79. Read as restrictive lung disease, possible with obstructive lung  disease. Prior smoker.    . PE (pulmonary thromboembolism) 09/13/2006    In 2008. On Warfarin until 01/2010 when he was admitted for syncope and noticed that he had completed course of warfarin and it was stopped.    Past Surgical History  Procedure Laterality Date  . Transurethral resection of prostate  3/05    [name]   Social History:  reports that he has quit smoking. He does not have any smokeless tobacco history on file. He reports that he does not drink alcohol or use illicit drugs.  No Known Allergies  Family History  Problem Relation Age of Onset  . Heart disease Father   . Stroke Father   . Heart disease Brother 83     Prior to Admission medications   Medication Sig Start Date End Date Taking? Authorizing Provider  albuterol (PROVENTIL HFA;VENTOLIN HFA) 108 (90 BASE) MCG/ACT inhaler Inhale 2 puffs into the lungs every 6 (six) hours as needed for wheezing or shortness of breath. 02/04/13  Yes [name], MD  aspirin EC 81 MG tablet Take 81 mg by mouth every evening.   Yes Historical Provider, MD  hydrochlorothiazide (MICROZIDE) 12.5 MG capsule Take 1 capsule (12.5 mg total) by mouth daily. 07/13/13 07/13/14 Yes Bartholomew Crews, MD  HYDROcodone-acetaminophen (NORCO/VICODIN) 5-325 MG per tablet Take 1/2 - 1 tablets every 8 hours as needed  for severe pain 02/27/14  Yes Carlisle Cater, PA-C  latanoprost (XALATAN) 0.005 % ophthalmic solution Place 1 drop into both eyes at bedtime.   Yes Historical Provider, MD  lisinopril (PRINIVIL,ZESTRIL) 20 MG tablet Take 20 mg by mouth every evening.   Yes Historical Provider, MD  polyethylene glycol (MIRALAX / GLYCOLAX) packet Take 17 g by mouth daily. 10/23/13  Yes Wandra Arthurs, MD   Physical Exam: Marcelline Mates:   03/03/14 2130 03/03/14 2145 03/03/14 2215 03/03/14 2230  BP: 112/56 96/58 98/63  104/65  Pulse: 63 63 60 63  Temp:      TempSrc:      Resp: 11 27 13 19   SpO2: 98% 95% 100% 96%    Wt Readings from Last 3 Encounters:    02/27/14 88.905 kg (196 lb)  02/03/14 89.086 kg (196 lb 6.4 oz)  11/05/13 89.721 kg (197 lb 12.8 oz)    General:  Appears calm and comfortable Eyes: PERRL, normal lids, irises & conjunctiva ENT: grossly normal hearing, lips & tongue Neck: no LAD, masses or thyromegaly Cardiovascular: RRR, no m/r Respiratory: CTA bilaterally, no w/r/r. Normal respiratory effort. Abdomen: soft, ntnd Skin: no induration ++warmth over the right wrist Musculoskeletal: severe pain in his right wrist and little finger on movement Psychiatric: grossly normal mood and affect, speech fluent and appropriate Neurologic: grossly non-focal.          Labs on Admission:  Basic Metabolic Panel:  Recent Labs Lab 03/03/14 1845  NA 133*  K 4.7  CL 98  CO2 25  GLUCOSE 88  BUN 22  CREATININE 1.33  CALCIUM 9.4   Liver Function Tests: No results for input(s): AST, ALT, ALKPHOS, BILITOT, PROT, ALBUMIN in the last 168 hours. No results for input(s): LIPASE, AMYLASE in the last 168 hours. No results for input(s): AMMONIA in the last 168 hours. CBC:  Recent Labs Lab 03/03/14 1845  WBC 8.0  NEUTROABS 4.4  HGB 14.5  HCT 41.9  MCV 80.0  PLT 341   Cardiac Enzymes: No results for input(s): CKTOTAL, CKMB, CKMBINDEX, TROPONINI in the last 168 hours.  BNP (last 3 results) No results for input(s): BNP in the last 8760 hours.  ProBNP (last 3 results) No results for input(s): PROBNP in the last 8760 hours.  CBG: No results for input(s): GLUCAP in the last 168 hours.  Radiological Exams on Admission: Dg Wrist Complete Right  03/03/2014   CLINICAL DATA:  Acute right wrist pain for 5 days without known injury. Initial encounter.  EXAM: RIGHT WRIST - COMPLETE 3+ VIEW  COMPARISON:  February 27, 2014; August 04, 2007.  FINDINGS: No acute fracture or dislocation is noted. Narrowing of the radiocarpal joint is noted with subchondral cyst formation in the distal radius. Widening of scapholunate space is noted suggesting  ligamentous injury.  IMPRESSION: Degenerative changes as described above.  No acute abnormality seen.   Electronically Signed   By: Sabino Dick M.D.   On: 03/03/2014 21:09   Dg Hand Complete Right  03/03/2014   CLINICAL DATA:  Right hand pain for 5 days.  No known injury.  EXAM: RIGHT HAND - COMPLETE 3+ VIEW  COMPARISON:  Plain films of the right wrist 08/04/2007.  FINDINGS: No acute bony or joint abnormality is identified. The patient has marked radiocarpal degenerative change. The scapholunate interval is widened with proximal migration of the capitate. Small bony fragment off the dorsal aspect of the wrist may be due to old trauma and is unchanged.  IMPRESSION: No  acute finding.  Radiocarpal degenerative change in [facility] wrist.   Electronically Signed   By: Inge Rise M.D.   On: 03/03/2014 19:27      Assessment/Plan Active Problems:   Essential hypertension   Asthma, chronic   Flexor tenosynovitis of finger   1. Flexor tenosynovitis of finger -will admit to med-surg -ED called Hand surgery they will see patient in am -started on antibiotics emperically with zosyn and vancocin -cultures have been drawn in the ED  2. Asthma -currently stable -will continue proventil as needed  3. HTN -currently stable -will monitor pressures -will continue with home medications    Code Status: Full Code (must indicate code status--if unknown or must be presumed, indicate so) DVT Prophylaxis:Heparin Family Communication: Daughter (indicate person spoken with, if applicable, with phone number if by telephone) Disposition Plan: Home (indicate anticipated LOS)  Time spent: 25min  Angelle Isais A [facility] Pager [phone]

## 2014-03-03 NOTE — ED Notes (Signed)
Pt presents with worsening pain to R wrist.  Pt seen at clinic and then here for same, reports he had xray done and given splint.  Pt denies any initial injury, reports he noted pain and swelling to R 5th finger then onset of wrist pain.  Pt reports swelling to R wrist, reports inability to sleep due to pain.
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
## 2014-03-03 NOTE — Progress Notes (Signed)
Medicine attending: I personally interviewed and briefly examined this patient and reviewed pertinent laboratory and radiographic data together with resident physician Dr.Nora Algis Liming and I concur with her evaluation and management plan. This man has a history of gout which up until now is only affected joints in his feet. He now presents with acute onset of severe pain in his right wrist. Significant limitation and motion. Extension of pain and swelling into his small finger. Pain on palpation at the wrist joint and along the carpal tendons. Concern is that this is not gout but a infected joint. He will be referred to the emergency department today and consideration of joint aspiration for culture and crystal analysis.

## 2014-03-03 NOTE — Progress Notes (Addendum)
ANTIBIOTIC CONSULT NOTE - INITIAL  Pharmacy Consult for Vancomycin/Zosyn Indication: Tenosynovitis  No Known Allergies   Vital Signs: Temp: 98.6 F (37 C) (02/04 2321) Temp Source: Oral (02/04 1503) BP: 113/70 mmHg (02/04 2321) Pulse Rate: 53 (02/04 2321)  Labs:  Recent Labs  03/03/14 1845  WBC 8.0  HGB 14.5  PLT 341  CREATININE 1.33    Medical History: Past Medical History  Diagnosis Date  . HTN (hypertension)   . BPH (benign prostatic hyperplasia)   . History of hypercholesterolemia   . Other chest pain     Adenosine myoview in 2004 showing EF 52% with no evidence for ischemia.  There was a basal inferior fixed defect that my have been artifact.  . Hypercholesterolemia   . Diverticulitis     colonoscopy in 12/05  . Osteoarthritis     Primarily left knee, acute right knee swelling in 9/07  . Colitis     3-4 ulcers on colonoscopy in 12/05  . Wrist disorder     Proximal carpo-scapholunate dissosiation -seen by Dr. Derryl Harbor  . Depression   . Gastritis   . Syncope     Suspect neurocardiogenic.  Always with standing or micturation.  Had bradycardic/hypotensive response with standing when hospitalized in 1/12.  On Florinef.  Echo (1/12): EF 60-65%, mild aortic root dilation.   . Aortic root dilatation 2012    Seen on Echo in 2012.  57mm  . Asthma, chronic 07/13/2013    PFT's 05/2012 : FEV1 / FVC 75 and 78 pre and post BD. FEV1 74 and 75 pre and post BD. TLC 72. RV 93. DLCO 79. Read as restrictive lung disease, possible with obstructive lung disease. Prior smoker.    . PE (pulmonary thromboembolism) 09/13/2006    In 2008. On Warfarin until 01/2010 when he was admitted for syncope and noticed that he had completed course of warfarin and it was stopped.    Assessment: Possible right wrist infection. WBC WNL. Renal function ok. Other labs as above.   Goal of Therapy:  Vancomycin trough level 15-20 mcg/ml  Plan:  -Vancomycin 750 mg IV q12h -Zosyn 3.375G IV q8h to be  infused over 4 hours -Trend WBC, temp, renal function  -Drug levels as indicated   Narda Bonds 03/03/2014,11:24 PM   SrCr this am 2.03.  Will adjust vancomycin to 1250 mg IV q24 hours  Excell Seltzer, PharmD

## 2014-03-04 ENCOUNTER — Encounter (HOSPITAL_COMMUNITY)
Admission: EM | Disposition: A | Discharge status: Home / Self Care | Payer: Self-pay | Source: Home / Self Care | Attending: Internal Medicine

## 2014-03-04 ENCOUNTER — Inpatient Hospital Stay (HOSPITAL_COMMUNITY): Payer: Commercial Managed Care - HMO | Admitting: Anesthesiology

## 2014-03-04 ENCOUNTER — Encounter (HOSPITAL_COMMUNITY): Payer: Self-pay | Admitting: Anesthesiology

## 2014-03-04 DIAGNOSIS — N179 Acute kidney failure, unspecified: Secondary | ICD-10-CM

## 2014-03-04 DIAGNOSIS — M79641 Pain in right hand: Secondary | ICD-10-CM

## 2014-03-04 HISTORY — PX: I & D EXTREMITY: SHX5045

## 2014-03-04 LAB — CBC
HCT: 38.9 % — ABNORMAL LOW (ref 39.0–52.0)
HEMOGLOBIN: 13 g/dL (ref 13.0–17.0)
MCH: 27.3 pg (ref 26.0–34.0)
MCHC: 33.4 g/dL (ref 30.0–36.0)
MCV: 81.6 fL (ref 78.0–100.0)
Platelets: 313 10*3/uL (ref 150–400)
RBC: 4.77 MIL/uL (ref 4.22–5.81)
RDW: 14 % (ref 11.5–15.5)
WBC: 5.6 10*3/uL (ref 4.0–10.5)

## 2014-03-04 LAB — APTT
APTT: 45 s — AB (ref 24–37)
APTT: 38 s — AB (ref 24–37)

## 2014-03-04 LAB — COMPREHENSIVE METABOLIC PANEL
ALBUMIN: 3.5 g/dL (ref 3.5–5.2)
ALT: 17 U/L (ref 0–53)
AST: 23 U/L (ref 0–37)
Alkaline Phosphatase: 57 U/L (ref 39–117)
Anion gap: 9 (ref 5–15)
BUN: 30 mg/dL — AB (ref 6–23)
CHLORIDE: 97 mmol/L (ref 96–112)
CO2: 25 mmol/L (ref 19–32)
Calcium: 8.5 mg/dL (ref 8.4–10.5)
Creatinine, Ser: 2.03 mg/dL — ABNORMAL HIGH (ref 0.50–1.35)
GFR calc Af Amer: 36 mL/min — ABNORMAL LOW (ref >90)
GFR, EST NON AFRICAN AMERICAN: 31 mL/min — AB (ref >90)
GLUCOSE: 95 mg/dL (ref 70–99)
Potassium: 4.6 mmol/L (ref 3.5–5.1)
Sodium: 131 mmol/L — ABNORMAL LOW (ref 135–145)
Total Bilirubin: 0.9 mg/dL (ref 0.3–1.2)
Total Protein: 7.3 g/dL (ref 6.0–8.3)

## 2014-03-04 LAB — GLUCOSE, CAPILLARY
GLUCOSE-CAPILLARY: 105 mg/dL — AB (ref 70–99)
Glucose-Capillary: 86 mg/dL (ref 70–99)

## 2014-03-04 LAB — URINALYSIS, ROUTINE W REFLEX MICROSCOPIC
Bilirubin Urine: NEGATIVE
Glucose, UA: NEGATIVE mg/dL
HGB URINE DIPSTICK: NEGATIVE
KETONES UR: NEGATIVE mg/dL
Leukocytes, UA: NEGATIVE
Nitrite: NEGATIVE
Protein, ur: NEGATIVE mg/dL
Specific Gravity, Urine: 1.015 (ref 1.005–1.030)
UROBILINOGEN UA: 1 mg/dL (ref 0.0–1.0)
pH: 5 (ref 5.0–8.0)

## 2014-03-04 LAB — C-REACTIVE PROTEIN: CRP: 2.6 mg/dL — ABNORMAL HIGH (ref <0.60)

## 2014-03-04 LAB — PROTIME-INR
INR: 1.63 — ABNORMAL HIGH (ref 0.00–1.49)
INR: 1.16 (ref 0.00–1.49)
PROTHROMBIN TIME: 19.5 s — AB (ref 11.6–15.2)
Prothrombin Time: 14.9 seconds (ref 11.6–15.2)

## 2014-03-04 LAB — URIC ACID: Uric Acid, Serum: 9 mg/dL — ABNORMAL HIGH (ref 4.0–7.8)

## 2014-03-04 SURGERY — IRRIGATION AND DEBRIDEMENT EXTREMITY
Anesthesia: General | Laterality: Right

## 2014-03-04 MED ORDER — DEXAMETHASONE SODIUM PHOSPHATE 4 MG/ML IJ SOLN
INTRAMUSCULAR | Status: AC
  Filled 2014-03-04: qty 2

## 2014-03-04 MED ORDER — MIDAZOLAM HCL 2 MG/2ML IJ SOLN
INTRAMUSCULAR | Status: AC
  Filled 2014-03-04: qty 2

## 2014-03-04 MED ORDER — DEXAMETHASONE SODIUM PHOSPHATE 4 MG/ML IJ SOLN
INTRAMUSCULAR | Status: DC | PRN
  Administered 2014-03-04: 8 mg via INTRAVENOUS

## 2014-03-04 MED ORDER — LACTATED RINGERS IV SOLN
INTRAVENOUS | Status: DC | PRN
  Administered 2014-03-04: 13:00:00 via INTRAVENOUS

## 2014-03-04 MED ORDER — EPHEDRINE SULFATE 50 MG/ML IJ SOLN
INTRAMUSCULAR | Status: DC | PRN
  Administered 2014-03-04: 10 mg via INTRAVENOUS
  Administered 2014-03-04: 5 mg via INTRAVENOUS
  Administered 2014-03-04: 10 mg via INTRAVENOUS

## 2014-03-04 MED ORDER — MIDAZOLAM HCL 5 MG/5ML IJ SOLN
INTRAMUSCULAR | Status: DC | PRN
  Administered 2014-03-04: 1 mg via INTRAVENOUS

## 2014-03-04 MED ORDER — LIDOCAINE HCL (CARDIAC) 20 MG/ML IV SOLN
INTRAVENOUS | Status: AC
  Filled 2014-03-04: qty 5

## 2014-03-04 MED ORDER — PROPOFOL 10 MG/ML IV BOLUS
INTRAVENOUS | Status: DC | PRN
  Administered 2014-03-04: 180 mg via INTRAVENOUS

## 2014-03-04 MED ORDER — PHENYLEPHRINE HCL 10 MG/ML IJ SOLN
INTRAMUSCULAR | Status: DC | PRN
  Administered 2014-03-04 (×5): 80 ug via INTRAVENOUS

## 2014-03-04 MED ORDER — CEFAZOLIN SODIUM-DEXTROSE 2-3 GM-% IV SOLR
INTRAVENOUS | Status: AC
  Filled 2014-03-04: qty 50

## 2014-03-04 MED ORDER — FENTANYL CITRATE 0.05 MG/ML IJ SOLN
INTRAMUSCULAR | Status: DC | PRN
  Administered 2014-03-04: 50 ug via INTRAVENOUS
  Administered 2014-03-04: 100 ug via INTRAVENOUS

## 2014-03-04 MED ORDER — ROCURONIUM BROMIDE 50 MG/5ML IV SOLN
INTRAVENOUS | Status: AC
  Filled 2014-03-04: qty 1

## 2014-03-04 MED ORDER — 0.9 % SODIUM CHLORIDE (POUR BTL) OPTIME
TOPICAL | Status: DC | PRN
  Administered 2014-03-04: 1000 mL

## 2014-03-04 MED ORDER — ONDANSETRON HCL 4 MG/2ML IJ SOLN
INTRAMUSCULAR | Status: DC | PRN
  Administered 2014-03-04: 4 mg via INTRAVENOUS

## 2014-03-04 MED ORDER — BUPIVACAINE HCL (PF) 0.25 % IJ SOLN
INTRAMUSCULAR | Status: DC | PRN
  Administered 2014-03-04: 20 mL

## 2014-03-04 MED ORDER — FENTANYL CITRATE 0.05 MG/ML IJ SOLN
INTRAMUSCULAR | Status: AC
  Filled 2014-03-04: qty 5

## 2014-03-04 MED ORDER — BUPIVACAINE HCL (PF) 0.25 % IJ SOLN
INTRAMUSCULAR | Status: AC
  Filled 2014-03-04: qty 30

## 2014-03-04 MED ORDER — LACTATED RINGERS IV SOLN
INTRAVENOUS | Status: DC
  Administered 2014-03-04: 12:00:00 via INTRAVENOUS

## 2014-03-04 MED ORDER — PROPOFOL 10 MG/ML IV BOLUS
INTRAVENOUS | Status: AC
  Filled 2014-03-04: qty 20

## 2014-03-04 MED ORDER — ONDANSETRON HCL 4 MG/2ML IJ SOLN
INTRAMUSCULAR | Status: AC
  Filled 2014-03-04: qty 2

## 2014-03-04 MED ORDER — LIDOCAINE HCL (CARDIAC) 10 MG/ML IV SOLN
INTRAVENOUS | Status: DC | PRN
  Administered 2014-03-04: 60 mg via INTRAVENOUS

## 2014-03-04 MED ORDER — VANCOMYCIN HCL 10 G IV SOLR
1250.0000 mg | INTRAVENOUS | Status: DC
  Filled 2014-03-04 (×2): qty 1250

## 2014-03-04 SURGICAL SUPPLY — 42 items
BAG DECANTER FOR FLEXI CONT (MISCELLANEOUS) IMPLANT
BANDAGE ELASTIC 3 VELCRO ST LF (GAUZE/BANDAGES/DRESSINGS) IMPLANT
BANDAGE ELASTIC 4 VELCRO ST LF (GAUZE/BANDAGES/DRESSINGS) ×3 IMPLANT
BNDG GAUZE ELAST 4 BULKY (GAUZE/BANDAGES/DRESSINGS) ×3 IMPLANT
CORDS BIPOLAR (ELECTRODE) ×3 IMPLANT
CUFF TOURNIQUET SINGLE 18IN (TOURNIQUET CUFF) IMPLANT
DRAPE SURG 17X23 STRL (DRAPES) ×3 IMPLANT
ELECT REM PT RETURN 9FT ADLT (ELECTROSURGICAL) ×3
ELECTRODE REM PT RTRN 9FT ADLT (ELECTROSURGICAL) ×1 IMPLANT
GAUZE PACKING IODOFORM 1/4X15 (GAUZE/BANDAGES/DRESSINGS) ×3 IMPLANT
GAUZE PACKING IODOFORM 1/4X5 (PACKING) IMPLANT
GAUZE SPONGE 4X4 12PLY STRL (GAUZE/BANDAGES/DRESSINGS) ×3 IMPLANT
GAUZE XEROFORM 1X8 LF (GAUZE/BANDAGES/DRESSINGS) ×3 IMPLANT
GLOVE BIOGEL M STRL SZ7.5 (GLOVE) ×6 IMPLANT
GOWN STRL REUS W/ TWL LRG LVL3 (GOWN DISPOSABLE) ×2 IMPLANT
GOWN STRL REUS W/TWL LRG LVL3 (GOWN DISPOSABLE) ×4
HANDPIECE INTERPULSE COAX TIP (DISPOSABLE)
KIT BASIN OR (CUSTOM PROCEDURE TRAY) ×3 IMPLANT
KIT ROOM TURNOVER OR (KITS) ×3 IMPLANT
MANIFOLD NEPTUNE II (INSTRUMENTS) ×3 IMPLANT
NEEDLE HYPO 25GX1X1/2 BEV (NEEDLE) IMPLANT
NS IRRIG 1000ML POUR BTL (IV SOLUTION) ×3 IMPLANT
PACK ORTHO EXTREMITY (CUSTOM PROCEDURE TRAY) ×3 IMPLANT
PAD ARMBOARD 7.5X6 YLW CONV (MISCELLANEOUS) ×6 IMPLANT
PAD CAST 4YDX4 CTTN HI CHSV (CAST SUPPLIES) IMPLANT
PADDING CAST COTTON 4X4 STRL (CAST SUPPLIES)
SET HNDPC FAN SPRY TIP SCT (DISPOSABLE) IMPLANT
SOAP 2 % CHG 4 OZ (WOUND CARE) ×3 IMPLANT
SPONGE LAP 18X18 X RAY DECT (DISPOSABLE) ×3 IMPLANT
SPONGE LAP 4X18 X RAY DECT (DISPOSABLE) IMPLANT
SUT ETHILON 5 0 PS 2 18 (SUTURE) ×3 IMPLANT
SUT VIC AB 4-0 SH 27 (SUTURE) ×2
SUT VIC AB 4-0 SH 27XBRD (SUTURE) ×1 IMPLANT
SWAB CULTURE LIQUID MINI MALE (MISCELLANEOUS) ×3 IMPLANT
SYR CONTROL 10ML LL (SYRINGE) IMPLANT
TOWEL OR 17X24 6PK STRL BLUE (TOWEL DISPOSABLE) ×3 IMPLANT
TOWEL OR 17X26 10 PK STRL BLUE (TOWEL DISPOSABLE) ×3 IMPLANT
TUBE ANAEROBIC SPECIMEN COL (MISCELLANEOUS) IMPLANT
TUBE CONNECTING 12'X1/4 (SUCTIONS) ×1
TUBE CONNECTING 12X1/4 (SUCTIONS) ×2 IMPLANT
WATER STERILE IRR 1000ML POUR (IV SOLUTION) IMPLANT
YANKAUER SUCT BULB TIP NO VENT (SUCTIONS) ×3 IMPLANT

## 2014-03-04 NOTE — Progress Notes (Signed)
Nutrition Brief Note  Patient identified on the Malnutrition Screening Tool (MST) Report  Wt Readings from Last 15 Encounters:  02/27/14 196 lb (88.905 kg)  02/03/14 196 lb 6.4 oz (89.086 kg)  11/05/13 197 lb 12.8 oz (89.721 kg)  07/13/13 193 lb 3.2 oz (87.635 kg)  04/08/13 204 lb 8 oz (92.761 kg)  02/04/13 207 lb 4.8 oz (94.031 kg)  11/26/12 202 lb 11.2 oz (91.944 kg)  06/12/12 208 lb 1.6 oz (94.394 kg)  04/10/12 210 lb (95.255 kg)  01/10/12 227 lb 14.4 oz (103.375 kg)  10/14/11 220 lb (99.791 kg)  10/07/11 220 lb 6.4 oz (99.973 kg)  09/14/10 209 lb 8 oz (95.029 kg)  05/25/10 204 lb 6.4 oz (92.715 kg)  05/25/10 202 lb (91.627 kg)    Body Mass Index: 28.40 kg/(m^2). Patient meets criteria for overweight based on current BMI. Weight has been stable.  Pt is currently NPO at this time. Pt reports appetite is good currently and PTA at home eating 3 meals a day with no other difficulties. No significant fat or muscle mass loss observed. Labs and medications reviewed.   No nutrition interventions warranted at this time. If nutrition issues arise, please consult RD.   Kallie Locks, MS, RD, LDN Pager # [phone] After hours/ weekend pager # [phone]

## 2014-03-04 NOTE — Transfer of Care (Signed)
Immediate Anesthesia Transfer of Care Note  Patient: [name]  Procedure(s) Performed: Procedure(s) with comments: IRRIGATION AND DEBRIDEMENT FINGER / HAND (Right) - I&D Right Small Finger and Right Wrist  Patient Location: PACU  Anesthesia Type:General  Level of Consciousness: awake, alert  and patient cooperative  Airway & Oxygen Therapy: Patient Spontanous Breathing and Patient connected to nasal cannula oxygen  Post-op Assessment: Report given to RN, Post -op Vital signs reviewed and stable and Patient moving all extremities  Post vital signs: Reviewed and stable  Last Vitals:  Filed Vitals:   03/04/14 1423  BP:   Pulse: 76  Temp:   Resp: 11    Complications: No apparent anesthesia complications

## 2014-03-04 NOTE — Anesthesia Postprocedure Evaluation (Signed)
  Anesthesia Post-op Note  Patient: [name]  Procedure(s) Performed: Procedure(s) with comments: IRRIGATION AND DEBRIDEMENT FINGER / HAND (Right) - I&D Right Small Finger and Right Wrist  Patient Location: PACU  Anesthesia Type:General  Level of Consciousness: awake and alert   Airway and Oxygen Therapy: Patient Spontanous Breathing  Post-op Pain: none  Post-op Assessment: Post-op Vital signs reviewed  Post-op Vital Signs: Reviewed  Last Vitals:  Filed Vitals:   03/04/14 1536  BP: 113/64  Pulse: 73  Temp: 36.4 C  Resp: 16    Complications: No apparent anesthesia complications

## 2014-03-04 NOTE — Anesthesia Preprocedure Evaluation (Addendum)
Anesthesia Evaluation  Patient identified by MRN, date of birth, ID band Patient awake    Reviewed: Allergy & Precautions, NPO status , Patient's Chart, lab work & pertinent test results  Airway Mallampati: II  TM Distance: >3 FB Neck ROM: Limited    Dental  (+) Edentulous Upper, Edentulous Lower   Pulmonary asthma , former smoker,          Cardiovascular hypertension, Pt. on medications + Peripheral Vascular Disease     Neuro/Psych negative neurological ROS     GI/Hepatic negative GI ROS,   Endo/Other  negative endocrine ROS  Renal/GU ARF and CRFRenal disease     Musculoskeletal  (+) Arthritis -,   Abdominal   Peds  Hematology negative hematology ROS (+)   Anesthesia Other Findings   Reproductive/Obstetrics                            Anesthesia Physical Anesthesia Plan  ASA: III  Anesthesia Plan: General   Post-op Pain Management:    Induction: Intravenous  Airway Management Planned: LMA  Additional Equipment:   Intra-op Plan:   Post-operative Plan:   Informed Consent: I have reviewed the patients History and Physical, chart, labs and discussed the procedure including the risks, benefits and alternatives for the proposed anesthesia with the patient or authorized representative who has indicated his/her understanding and acceptance.     Plan Discussed with: CRNA  Anesthesia Plan Comments:         Anesthesia Quick Evaluation

## 2014-03-04 NOTE — Progress Notes (Signed)
Pt underwent I&D today of RSF, R dorsal hand; no gross infection noted, culture of tendon sheath obtained.  Would cont empiric antibiotics for now, until gstain returns.  Would also start anti-inflammatory as this process may represent inflammatory process rather than infectious.

## 2014-03-04 NOTE — Consult Note (Signed)
Reason for Consult:R hand pain, ?infection Referring Physician: ER/Hospitalist  CC:My hand and small finger hurts  HPI:  [name] is an 74 y.o. left handed male who presents with pain and swelling of R hand, sf, presented for this last week, given pain meds, has become worse, no antibiotics per pt.  Denies injury.       .   Pain is rated at   5 /10 and is described as sharp/dull.  Pain is constant/intermittant.  Pain is made better by rest/immobilization, worse with motion.   Associated signs/symptoms:h.o gout Previous treatment:    Past Medical History  Diagnosis Date  . HTN (hypertension)   . BPH (benign prostatic hyperplasia)   . History of hypercholesterolemia   . Other chest pain     Adenosine myoview in 2004 showing EF 52% with no evidence for ischemia.  There was a basal inferior fixed defect that my have been artifact.  . Hypercholesterolemia   . Diverticulitis     colonoscopy in 12/05  . Osteoarthritis     Primarily left knee, acute right knee swelling in 9/07  . Colitis     3-4 ulcers on colonoscopy in 12/05  . Wrist disorder     Proximal carpo-scapholunate dissosiation -seen by Dr. Derryl Harbor  . Depression   . Gastritis   . Syncope     Suspect neurocardiogenic.  Always with standing or micturation.  Had bradycardic/hypotensive response with standing when hospitalized in 1/12.  On Florinef.  Echo (1/12): EF 60-65%, mild aortic root dilation.   . Aortic root dilatation 2012    Seen on Echo in 2012.  82m  . Asthma, chronic 07/13/2013    PFT's 05/2012 : FEV1 / FVC 75 and 78 pre and post BD. FEV1 74 and 75 pre and post BD. TLC 72. RV 93. DLCO 79. Read as restrictive lung disease, possible with obstructive lung disease. Prior smoker.    . PE (pulmonary thromboembolism) 09/13/2006    In 2008. On Warfarin until 01/2010 when he was admitted for syncope and noticed that he had completed course of warfarin and it was stopped.     Past Surgical History  Procedure Laterality Date   . Transurethral resection of prostate  3/05    PTerance Hart   Family History  Problem Relation Age of Onset  . Heart disease Father   . Stroke Father   . Heart disease Brother 685   Social History:  reports that he has quit smoking. He does not have any smokeless tobacco history on file. He reports that he does not drink alcohol or use illicit drugs.  Allergies: No Known Allergies  Medications: I have reviewed the patient's current medications.  Results for orders placed or performed during the hospital encounter of 03/03/14 (from the past 48 hour(s))  CBC with Differential     Status: None   Collection Time: 03/03/14  6:45 PM  Result Value Ref Range   WBC 8.0 4.0 - 10.5 K/uL   RBC 5.24 4.22 - 5.81 MIL/uL   Hemoglobin 14.5 13.0 - 17.0 g/dL   HCT 41.9 39.0 - 52.0 %   MCV 80.0 78.0 - 100.0 fL   MCH 27.7 26.0 - 34.0 pg   MCHC 34.6 30.0 - 36.0 g/dL   RDW 13.9 11.5 - 15.5 %   Platelets 341 150 - 400 K/uL   Neutrophils Relative % 55 43 - 77 %   Neutro Abs 4.4 1.7 - 7.7 K/uL   Lymphocytes Relative  34 12 - 46 %   Lymphs Abs 2.7 0.7 - 4.0 K/uL   Monocytes Relative 7 3 - 12 %   Monocytes Absolute 0.6 0.1 - 1.0 K/uL   Eosinophils Relative 3 0 - 5 %   Eosinophils Absolute 0.2 0.0 - 0.7 K/uL   Basophils Relative 1 0 - 1 %   Basophils Absolute 0.1 0.0 - 0.1 K/uL  Basic metabolic panel     Status: Abnormal   Collection Time: 03/03/14  6:45 PM  Result Value Ref Range   Sodium 133 (L) 135 - 145 mmol/L   Potassium 4.7 3.5 - 5.1 mmol/L   Chloride 98 96 - 112 mmol/L   CO2 25 19 - 32 mmol/L   Glucose, Bld 88 70 - 99 mg/dL   BUN 22 6 - 23 mg/dL   Creatinine, Ser 1.33 0.50 - 1.35 mg/dL   Calcium 9.4 8.4 - 10.5 mg/dL   GFR calc non Af Amer 52 (L) >90 mL/min   GFR calc Af Amer 60 (L) >90 mL/min    Comment: (NOTE) The eGFR has been calculated using the CKD EPI equation. This calculation has not been validated in all clinical situations. eGFR's persistently <90 mL/min signify possible  Chronic Kidney Disease.    Anion gap 10 5 - 15  Sedimentation rate     Status: Abnormal   Collection Time: 03/03/14  6:45 PM  Result Value Ref Range   Sed Rate 24 (H) 0 - 16 mm/hr  C-reactive protein     Status: Abnormal   Collection Time: 03/03/14  6:45 PM  Result Value Ref Range   CRP 2.6 (H) <0.60 mg/dL    Comment: Performed at [place]     Status: Abnormal   Collection Time: 03/04/14  5:14 AM  Result Value Ref Range   Prothrombin Time 19.5 (H) 11.6 - 15.2 seconds    Comment: QUESTIONABLE RESULTS, RECOMMEND RECOLLECT TO VERIFY NOTIFIED B.SCOTT,RN AT 0740 03/04/14 BY BSLADE.    INR 1.63 (H) 0.00 - 1.49    Comment: QUESTIONABLE RESULTS, RECOMMEND RECOLLECT TO VERIFY NOTIFIED B.SCOTT,RN AT 0740 03/04/14 BY ZBEECH.   APTT     Status: Abnormal   Collection Time: 03/04/14  5:14 AM  Result Value Ref Range   aPTT 45 (H) 24 - 37 seconds    Comment:        IF BASELINE aPTT IS ELEVATED, SUGGEST PATIENT RISK ASSESSMENT BE USED TO DETERMINE APPROPRIATE ANTICOAGULANT THERAPY. QUESTIONABLE RESULTS, RECOMMEND RECOLLECT TO VERIFY NOTIFIED B.SCOTT,RN AT 0740 03/04/14 BY BSLADE.   Glucose, capillary     Status: None   Collection Time: 03/04/14  6:28 AM  Result Value Ref Range   Glucose-Capillary 86 70 - 99 mg/dL   Comment 1 Notify RN   CBC     Status: Abnormal   Collection Time: 03/04/14  8:22 AM  Result Value Ref Range   WBC 5.6 4.0 - 10.5 K/uL   RBC 4.77 4.22 - 5.81 MIL/uL   Hemoglobin 13.0 13.0 - 17.0 g/dL   HCT 38.9 (L) 39.0 - 52.0 %   MCV 81.6 78.0 - 100.0 fL   MCH 27.3 26.0 - 34.0 pg   MCHC 33.4 30.0 - 36.0 g/dL   RDW 14.0 11.5 - 15.5 %   Platelets 313 150 - 400 K/uL  Comprehensive metabolic panel     Status: Abnormal   Collection Time: 03/04/14  8:22 AM  Result Value Ref Range   Sodium 131 (L) 135 -  145 mmol/L   Potassium 4.6 3.5 - 5.1 mmol/L   Chloride 97 96 - 112 mmol/L   CO2 25 19 - 32 mmol/L   Glucose, Bld 95 70 - 99 mg/dL   BUN 30 (H) 6 - 23  mg/dL   Creatinine, Ser 2.03 (H) 0.50 - 1.35 mg/dL   Calcium 8.5 8.4 - 10.5 mg/dL   Total Protein 7.3 6.0 - 8.3 g/dL   Albumin 3.5 3.5 - 5.2 g/dL   AST 23 0 - 37 U/L   ALT 17 0 - 53 U/L   Alkaline Phosphatase 57 39 - 117 U/L   Total Bilirubin 0.9 0.3 - 1.2 mg/dL   GFR calc non Af Amer 31 (L) >90 mL/min   GFR calc Af Amer 36 (L) >90 mL/min    Comment: (NOTE) The eGFR has been calculated using the CKD EPI equation. This calculation has not been validated in all clinical situations. eGFR's persistently <90 mL/min signify possible Chronic Kidney Disease.    Anion gap 9 5 - 15  Protime-INR     Status: None   Collection Time: 03/04/14  8:22 AM  Result Value Ref Range   Prothrombin Time 14.9 11.6 - 15.2 seconds   INR 1.16 0.00 - 1.49  APTT     Status: Abnormal   Collection Time: 03/04/14  8:22 AM  Result Value Ref Range   aPTT 38 (H) 24 - 37 seconds    Comment:        IF BASELINE aPTT IS ELEVATED, SUGGEST PATIENT RISK ASSESSMENT BE USED TO DETERMINE APPROPRIATE ANTICOAGULANT THERAPY.   Uric acid     Status: Abnormal   Collection Time: 03/04/14 11:00 AM  Result Value Ref Range   Uric Acid, Serum 9.0 (H) 4.0 - 7.8 mg/dL    Dg Wrist Complete Right  03/03/2014   CLINICAL DATA:  Acute right wrist pain for 5 days without known injury. Initial encounter.  EXAM: RIGHT WRIST - COMPLETE 3+ VIEW  COMPARISON:  February 27, 2014; August 04, 2007.  FINDINGS: No acute fracture or dislocation is noted. Narrowing of the radiocarpal joint is noted with subchondral cyst formation in the distal radius. Widening of scapholunate space is noted suggesting ligamentous injury.  IMPRESSION: Degenerative changes as described above.  No acute abnormality seen.   Electronically Signed   By: Sabino Dick M.D.   On: 03/03/2014 21:09   Dg Hand Complete Right  03/03/2014   CLINICAL DATA:  Right hand pain for 5 days.  No known injury.  EXAM: RIGHT HAND - COMPLETE 3+ VIEW  COMPARISON:  Plain films of the right wrist  08/04/2007.  FINDINGS: No acute bony or joint abnormality is identified. The patient has marked radiocarpal degenerative change. The scapholunate interval is widened with proximal migration of the capitate. Small bony fragment off the dorsal aspect of the wrist may be due to old trauma and is unchanged.  IMPRESSION: No acute finding.  Radiocarpal degenerative change in [facility] wrist.   Electronically Signed   By: Inge Rise M.D.   On: 03/03/2014 19:27    Pertinent items are noted in HPI. Temp:  [98.3 F (36.8 C)-98.8 F (37.1 C)] 98.3 F (36.8 C) (02/05 1135) Pulse Rate:  [53-71] 57 (02/05 1135) Resp:  [11-27] 18 (02/05 1135) BP: (91-117)/(56-89) 110/68 mmHg (02/05 0640) SpO2:  [93 %-100 %] 99 % (02/05 1135) General appearance: alert and cooperative Resp: clear to auscultation bilaterally Cardio: regular rate and rhythm GI: soft, non-tender; bowel sounds normal; no  masses,  no organomegaly Extremities: R hand with tenderness, sweling over distal ulna, swollen SF, with pain over tendon sheath   Assessment: Tenosynovitis RSF, ? Tenosynovitis of ecu  Plan: Will I&D RSF tendon sheath, ? ecu tendon sheath I have discussed this treatment plan in detail with patient and family, including the risks of the recommended treatment or surgery, the benefits and the alternatives.  The patient understands that additional treatment may be necessary.  Aala Ransom CHRISTOPHER 03/04/2014, 1:05 PM

## 2014-03-04 NOTE — Progress Notes (Signed)
[facility] PROGRESS NOTE  [name] NID:[id number] DOB: 07-22-1940 DOA: 03/03/2014 PCP: Larey Dresser, MD  Assessment/Plan: 74 y/o male with PMH of HTN, BPH, DJD, h/o gout presented with R wrist pain, Initially seen in the office (2/4) referred to ED evaluation due to suspected infected joint   1. R wrist pain  R/o infected joint; ED d/w Dr. Lenon Curt who recommended IV atx (deescalate after surgery eval); awaiting hand surgery eval; will check uric acid, with h/o gout patient may have probable gouty arthritis (diuretic induced);  -afebrile, no leucocytosis; if ruled out infection per surgery, patient may need short term steroid for gout   2. AKI probable due to diuretics; check UA, hodl diuretics/ACE; start IVF; monitor urine output  3. HTN, stable off meds;    Code Status: full Family Communication: d/w patient, his daughter  (indicate person spoken with, relationship, and if by phone, the number) Disposition Plan: pend clinical improvement    Consultants:  surgery  Procedures:  none  Antibiotics:  vanc 2/4<>2/5  Zosyn 2/4<<<<   (indicate start date, and stop date if known)  HPI/Subjective: alert  Objective: Filed Vitals:   03/04/14 0640  BP: 110/68  Pulse: 66  Temp: 98.4 F (36.9 C)  Resp: 18    Intake/Output Summary (Last 24 hours) at 03/04/14 1045 Last data filed at 03/04/14 0300  Gross per 24 hour  Intake    220 ml  Output      0 ml  Net    220 ml   There were no vitals filed for this visit.  Exam:   General:  alert  Cardiovascular: s1,s2 rrr  Respiratory: CTA BL  Abdomen: soft,nt,nd   Musculoskeletal: no leg edema   Data Reviewed: Basic Metabolic Panel:  Recent Labs Lab 03/03/14 1845 03/04/14 0822  NA 133* 131*  K 4.7 4.6  CL 98 97  CO2 25 25  GLUCOSE 88 95  BUN 22 30*  CREATININE 1.33 2.03*  CALCIUM 9.4 8.5   Liver Function Tests:  Recent Labs Lab 03/04/14 0822  AST 23  ALT 17  ALKPHOS 57  BILITOT 0.9   PROT 7.3  ALBUMIN 3.5   No results for input(s): LIPASE, AMYLASE in the last 168 hours. No results for input(s): AMMONIA in the last 168 hours. CBC:  Recent Labs Lab 03/03/14 1845 03/04/14 0822  WBC 8.0 5.6  NEUTROABS 4.4  --   HGB 14.5 13.0  HCT 41.9 38.9*  MCV 80.0 81.6  PLT 341 313   Cardiac Enzymes: No results for input(s): CKTOTAL, CKMB, CKMBINDEX, TROPONINI in the last 168 hours. BNP (last 3 results) No results for input(s): BNP in the last 8760 hours.  ProBNP (last 3 results) No results for input(s): PROBNP in the last 8760 hours.  CBG:  Recent Labs Lab 03/04/14 0628  GLUCAP 86    No results found for this or any previous visit (from the past 240 hour(s)).   Studies: Dg Wrist Complete Right  03/03/2014   CLINICAL DATA:  Acute right wrist pain for 5 days without known injury. Initial encounter.  EXAM: RIGHT WRIST - COMPLETE 3+ VIEW  COMPARISON:  February 27, 2014; August 04, 2007.  FINDINGS: No acute fracture or dislocation is noted. Narrowing of the radiocarpal joint is noted with subchondral cyst formation in the distal radius. Widening of scapholunate space is noted suggesting ligamentous injury.  IMPRESSION: Degenerative changes as described above.  No acute abnormality seen.   Electronically Signed   By: Sabino Dick  M.D.   On: 03/03/2014 21:09   Dg Hand Complete Right  03/03/2014   CLINICAL DATA:  Right hand pain for 5 days.  No known injury.  EXAM: RIGHT HAND - COMPLETE 3+ VIEW  COMPARISON:  Plain films of the right wrist 08/04/2007.  FINDINGS: No acute bony or joint abnormality is identified. The patient has marked radiocarpal degenerative change. The scapholunate interval is widened with proximal migration of the capitate. Small bony fragment off the dorsal aspect of the wrist may be due to old trauma and is unchanged.  IMPRESSION: No acute finding.  Radiocarpal degenerative change in [facility] wrist.   Electronically Signed   By: Inge Rise M.D.   On: 03/03/2014  19:27    Scheduled Meds: . folic acid  1 mg Oral Daily  . heparin  5,000 Units Subcutaneous 3 times per day  . hydrochlorothiazide  12.5 mg Oral Daily  . latanoprost  1 drop Both Eyes QHS  . lisinopril  20 mg Oral QPM  . multivitamin with minerals  1 tablet Oral Daily  . piperacillin-tazobactam (ZOSYN)  IV  3.375 g Intravenous 3 times per day  . polyethylene glycol  17 g Oral Daily  . thiamine  100 mg Oral Daily  . vancomycin  1,250 mg Intravenous Q24H   Continuous Infusions: . sodium chloride 50 mL/hr at 03/03/14 2336    Active Problems:   Essential hypertension   Asthma, chronic   Flexor tenosynovitis of finger    Time spent: >35 minutes     Kinnie Feil  [facility] Pager [phone]. If 7PM-7AM, please contact night-coverage at [email or web address], password [facility] 03/04/2014, 10:45 AM  LOS: 1 day

## 2014-03-05 LAB — BASIC METABOLIC PANEL
ANION GAP: 6 (ref 5–15)
BUN: 32 mg/dL — ABNORMAL HIGH (ref 6–23)
CALCIUM: 8.6 mg/dL (ref 8.4–10.5)
CO2: 25 mmol/L (ref 19–32)
CREATININE: 1.79 mg/dL — AB (ref 0.50–1.35)
Chloride: 100 mmol/L (ref 96–112)
GFR calc Af Amer: 42 mL/min — ABNORMAL LOW (ref >90)
GFR calc non Af Amer: 36 mL/min — ABNORMAL LOW (ref >90)
GLUCOSE: 124 mg/dL — AB (ref 70–99)
POTASSIUM: 4.9 mmol/L (ref 3.5–5.1)
Sodium: 131 mmol/L — ABNORMAL LOW (ref 135–145)

## 2014-03-05 LAB — GLUCOSE, CAPILLARY: Glucose-Capillary: 120 mg/dL — ABNORMAL HIGH (ref 70–99)

## 2014-03-05 MED ORDER — CIPROFLOXACIN HCL 500 MG PO TABS
500.0000 mg | ORAL_TABLET | Freq: Two times a day (BID) | ORAL | Status: DC
  Filled 2014-03-05: qty 1

## 2014-03-05 MED ORDER — SODIUM CHLORIDE 0.9 % IV SOLN
INTRAVENOUS | Status: DC
  Administered 2014-03-05: 11:00:00 via INTRAVENOUS

## 2014-03-05 MED ORDER — DOXYCYCLINE HYCLATE 100 MG PO TABS: 100.0000 mg | ORAL_TABLET | Freq: Two times a day (BID) | ORAL | Status: DC

## 2014-03-05 MED ORDER — CIPROFLOXACIN HCL 500 MG PO TABS: 500.0000 mg | ORAL_TABLET | Freq: Two times a day (BID) | ORAL | Status: DC

## 2014-03-05 MED ORDER — PREDNISONE 10 MG PO TABS: ORAL_TABLET | ORAL | Status: DC

## 2014-03-05 MED ORDER — PREDNISONE 20 MG PO TABS
40.0000 mg | ORAL_TABLET | Freq: Every day | ORAL | Status: DC
  Filled 2014-03-05: qty 2

## 2014-03-05 MED ORDER — OXYCODONE HCL 5 MG PO TABS: 5.0000 mg | ORAL_TABLET | ORAL | Status: DC | PRN

## 2014-03-05 MED ORDER — DOXYCYCLINE HYCLATE 100 MG PO TABS
100.0000 mg | ORAL_TABLET | Freq: Two times a day (BID) | ORAL | Status: DC
  Filled 2014-03-05: qty 1

## 2014-03-05 NOTE — Progress Notes (Signed)
[name] discharged home per MD order. Discharge instructions reviewed and discussed with patient. All questions and concerns answered. Copy of instructions and scripts given to patient. IV removed.  Patient escorted to car by staff in a wheelchair. No distress noted upon discharge.   Esaw Dace 03/05/2014 5:55 PM

## 2014-03-05 NOTE — Progress Notes (Signed)
Orthopedic Tech Progress Note Patient Details:  [name] [birth year] [id number]  Ortho Devices Type of Ortho Device: Arm sling Ortho Device/Splint Location: rue Ortho Device/Splint Interventions: Application   Hildred Priest 03/05/2014, 6:39 PM

## 2014-03-05 NOTE — Progress Notes (Signed)
S: hand sore  O:Blood pressure 120/60, pulse 76, temperature 98.1 F (36.7 C), temperature source Oral, resp. rate 16, SpO2 98 %.   Dressing changed, packing removed, minimal drainage, finer, hand still sore; grossly n/v intact  A:s/p I&D RSF, wrist aspiration, dorsal wrist washout   P:dressing changes explained to pt, wash with soap and water, small amount of antibiotic ointment over wounds and keep covered, elevate hand, gentle rom of fingers, wrist; would start on ?anti -inflammatory (watch Cr) and transition to ~7 day course of oral abx.  Ok for d/c from my standpoint.  Needs to f/u for suture removal, wound check in ~7 days

## 2014-03-05 NOTE — Op Note (Signed)
NAME:  [name], KOMAN NO.:  [id number]  MEDICAL RECORD NO.:  [id number]  LOCATION:  5N06C                        FACILITY:  [facility]  PHYSICIAN:  Dennie Bible, MD    DATE OF BIRTH:  1940/04/26  DATE OF PROCEDURE:  03/04/2014 DATE OF DISCHARGE:                              OPERATIVE REPORT   PREOPERATIVE DIAGNOSIS:  Swelling, erythema to the right small finger and right dorsal hand, suspicion of flexor tenosynovitis of the right small finger and extensor carpi ulnaris tendinitis of the dorsum of the right hand.  PROCEDURES: 1. Exploration and drainage of flexor tendon sheath of the right small     finger, release of the A1 pulley. 2. Aspiration of the radiocarpal wrist joint. 3. Exploration of the extensor carpi ulnaris tendon sheath with     irrigation of the tendon sheath.  ANESTHESIA:  General.  POSTOPERATIVE COMPLICATIONS:  None.  INDICATIONS:  [name] is a gentleman who presented actually a week ago in the emergency department with pain and swelling of his right hand and right small finger.  He was given pain medicine.  He was readmitted recently with the same and possibly worsening symptoms.  There was concern for infection etiology.  I was consulted.  On exam, he had evidence of what appeared to be tenosynovitis of the small finger as well as extreme tenderness over the protuberances of the ulna on the dorsal aspect of his hand and wrist, presumably infectious etiology as well.  Risks, benefits, and alternatives of I and D were discussed with the patient, and he agreed to proceed with exploration and consent was obtained.  DESCRIPTION OF PROCEDURE:  The patient was taken to the operating room and placed supine on the operating table.  General anesthesia was administered.  Time-out was performed.  The extremity was prepped and draped in normal sterile fashion.  Tourniquet was used, inflated to 250 mmHg.  Beginning on the right small finger, an  incision was made overlying the A1 pulley.  Blunt dissection was carried down to the tendon sheath.  Tendon sheath was opened.  There was some viscous type fluid, however, no frank pus in the tendon sheath.  A counter incision was made distally upon the FDP insertion.  Irrigation of the tendon sheath with a 20-gauge angiocatheter was performed.  Afterwards, small piece of iodoform gauze was placed in each of these incisions and the incisions were closed loosely around it.  Next, the dorsal wrist was addressed, aspiration of the radiocarpal joint on the ulnar side was performed.  There was minimal-to-no fluid present in the wrist joint that can explain his symptoms.  Next, an incision was made over the ulnar prominence dorsally.  Careful dissection was carried down through the subcutaneous tissues.  The issue tendon sheath was opened.  There was some maybe thickened tissue overlying the bone, however, there was no gross purulence that was encountered.  The space was thoroughly irrigated with irrigation solution and then the wound was closed.  Afterwards, sterile dressing was placed.  The patient tolerated the procedure well, was taken to the recovery room in stable condition.     Dennie Bible, MD  HCC/MEDQ  D:  03/04/2014  T:  03/05/2014  Job:  [id number]

## 2014-03-05 NOTE — Discharge Summary (Signed)
Physician Discharge Summary  Patient ID: [name] MRN: [id number] DOB/AGE: [id number] y.o.  Admit date: 03/03/2014 Discharge date: 03/05/2014  Primary Care Physician:  Larey Dresser, MD  Discharge Diagnoses:    flexor tenosynovitis of the right small finger extensor carpi ulnaris tendinitis of the dorsum of the right hand. Mild AK I  . Essential hypertension . Asthma, chronic   Consults:  Hand surgery, Dr. Dayna Barker   Recommendations for Outpatient Follow-up:  Per hand surgery, dressing changes explained to the patient, wash with soap and water and small amount of antibiotic ointment over the wounds and keep it covered  Elevate hand, gentle movement of the fingers, wrist  Seven-day course of the oral antibiotics, anti-inflammatory drugs if possible with creatinine function  Need to follow up for suture removal and wound check in 7 days.  TESTS THAT NEED FOLLOW-UP BMET   DIET: Heart healthy diet    Allergies:  No Known Allergies   Discharge Medications:   Medication List    STOP taking these medications        hydrochlorothiazide 12.5 MG capsule  Commonly known as:  MICROZIDE     HYDROcodone-acetaminophen 5-325 MG per tablet  Commonly known as:  NORCO/VICODIN     lisinopril 20 MG tablet  Commonly known as:  PRINIVIL,ZESTRIL      TAKE these medications        albuterol 108 (90 BASE) MCG/ACT inhaler  Commonly known as:  PROVENTIL HFA;VENTOLIN HFA  Inhale 2 puffs into the lungs every 6 (six) hours as needed for wheezing or shortness of breath.     aspirin EC 81 MG tablet  Take 81 mg by mouth every evening.     ciprofloxacin 500 MG tablet  Commonly known as:  CIPRO  Take 1 tablet (500 mg total) by mouth 2 (two) times daily. X 1 week     doxycycline 100 MG tablet  Commonly known as:  VIBRA-TABS  Take 1 tablet (100 mg total) by mouth 2 (two) times daily. X 1week     latanoprost 0.005 % ophthalmic solution  Commonly known as:  XALATAN   Place 1 drop into both eyes at bedtime.     oxyCODONE 5 MG immediate release tablet  Commonly known as:  Oxy IR/ROXICODONE  Take 1 tablet (5 mg total) by mouth every 4 (four) hours as needed for moderate pain.     polyethylene glycol packet  Commonly known as:  MIRALAX / GLYCOLAX  Take 17 g by mouth daily.     predniSONE 10 MG tablet  Commonly known as:  DELTASONE  Prednisone dosing: Take  Prednisone 40mg  (4 tabs) x2 days, then taper to 30mg  (3 tabs) x 2 days, then 20mg  (2 tabs) x 2days, then 10mg  (1 tab) x 2 days, then OFF.  Start taking on:  03/06/2014         Brief H and P: For complete details please refer to admission H and P, but in brief [name] is a 74 y.o. male presents with flexor tenosynovitis. Patient has a history of gout in the past and was actually seen today in the office for pain in his right wrist and little finger. It was suspected that he might have an infected joint and was sent to the ED. Patient was evaluated and hand surgery suggested admitting for IV antibiotics. Patient has severe pain in the wrist and also has noted increased warmth. Patient has had no fevers noted. Patient has no other complaints.  Hospital Course:  Right wrist pain and hand pain Patient was seen in residency teaching service clinic on 2/4 and was referred to ED for suspected infected joint. Patient was admitted by hospitalist service (?), Initial suspicion for acute gout versus acute infection. Hand surgery was consulted. Patient was seen by Dr. Lenon Curt.  Patient underwent exploration and drainage of the flexure tendon sheath of the right small finger, aspiration of the radiocarpal wrist joint and exploration of the extensor carpi ulnaris tendon sheath with irrigation of the tendon sheath. Patient was placed on IV vancomycin and Zosyn. Uric acid 9.0, CRP 2.6. At this point, Dr. Lenon Curt has recommended discharging from his standpoint with oral antibiotic course for 7 days. Patient  unfortunately cannot be on NSAIDs due to his creatinine 1.7. Placed on short course of prednisone. Dressing changes were explained to the patient by hand surgery and nursing staff. He will follow-up with Dr. Lenon Curt in one week. He was given a prescription for doxycycline, ciprofloxacin for 7 days, oxycodone PRN for pain (#30 with no refills)  Mild acute kidney injury Improving, creatinine was 2.0 on admission, improving to 1.7 today. Hydrochlorothiazide and lisinopril were discontinued, patient was gently hydrated with IV fluids.    Day of Discharge BP 120/60 mmHg  Pulse 76  Temp(Src) 98.1 F (36.7 C) (Oral)  Resp 16  SpO2 98%  Physical Exam: General: Alert and awake oriented x3 not in any acute distress. CVS: S1-S2 clear no murmur rubs or gallops Chest: clear to auscultation bilaterally, no wheezing rales or rhonchi Abdomen: soft nontender, nondistended, normal bowel sounds Extremities: no cyanosis, clubbing or edema noted bilaterallyRight hand dressing intact  Neuro: Cranial nerves II-XII intact, no focal neurological deficits   The results of significant diagnostics from this hospitalization (including imaging, microbiology, ancillary and laboratory) are listed below for reference.    LAB RESULTS: Basic Metabolic Panel:  Recent Labs Lab 03/04/14 0822 03/05/14 0457  NA 131* 131*  K 4.6 4.9  CL 97 100  CO2 25 25  GLUCOSE 95 124*  BUN 30* 32*  CREATININE 2.03* 1.79*  CALCIUM 8.5 8.6   Liver Function Tests:  Recent Labs Lab 03/04/14 0822  AST 23  ALT 17  ALKPHOS 57  BILITOT 0.9  PROT 7.3  ALBUMIN 3.5   No results for input(s): LIPASE, AMYLASE in the last 168 hours. No results for input(s): AMMONIA in the last 168 hours. CBC:  Recent Labs Lab 03/03/14 1845 03/04/14 0822  WBC 8.0 5.6  NEUTROABS 4.4  --   HGB 14.5 13.0  HCT 41.9 38.9*  MCV 80.0 81.6  PLT 341 313   Cardiac Enzymes: No results for input(s): CKTOTAL, CKMB, CKMBINDEX, TROPONINI in the  last 168 hours. BNP: Invalid input(s): POCBNP CBG:  Recent Labs Lab 03/04/14 1435 03/05/14 0630  GLUCAP 105* 120*    Significant Diagnostic Studies:  Dg Wrist Complete Right  03/03/2014   CLINICAL DATA:  Acute right wrist pain for 5 days without known injury. Initial encounter.  EXAM: RIGHT WRIST - COMPLETE 3+ VIEW  COMPARISON:  February 27, 2014; August 04, 2007.  FINDINGS: No acute fracture or dislocation is noted. Narrowing of the radiocarpal joint is noted with subchondral cyst formation in the distal radius. Widening of scapholunate space is noted suggesting ligamentous injury.  IMPRESSION: Degenerative changes as described above.  No acute abnormality seen.   Electronically Signed   By: Sabino Dick M.D.   On: 03/03/2014 21:09   Dg Hand Complete Right  03/03/2014  CLINICAL DATA:  Right hand pain for 5 days.  No known injury.  EXAM: RIGHT HAND - COMPLETE 3+ VIEW  COMPARISON:  Plain films of the right wrist 08/04/2007.  FINDINGS: No acute bony or joint abnormality is identified. The patient has marked radiocarpal degenerative change. The scapholunate interval is widened with proximal migration of the capitate. Small bony fragment off the dorsal aspect of the wrist may be due to old trauma and is unchanged.  IMPRESSION: No acute finding.  Radiocarpal degenerative change in [facility] wrist.   Electronically Signed   By: Inge Rise M.D.   On: 03/03/2014 19:27       Disposition and Follow-up: Discharge Instructions    Diet - low sodium heart healthy    Complete by:  As directed      Discharge instructions    Complete by:  As directed   Please take antibiotics with food.  Hold hydrochlorothiazide and lisinopril.  Please follow instructions per hand surgery, Dr. Lenon Curt and follow-up in one week.  Dressing changes, wash with soap and water and small amount of antibiotic ointment over the wounds and keep it covered  Elevate hand, gentle movement of the fingers, wrist  Need to follow up  for suture removal and wound check in 7 days.     Increase activity slowly    Complete by:  As directed             DISPOSITION: Home  DISCHARGE FOLLOW-UP Follow-up Information    Follow up with Rayvon Char, MD. Schedule an appointment as soon as possible for a visit in 7 days.   Specialty:  General Surgery   Contact information:   [address] [place] [address] [phone]       Follow up with Larey Dresser, MD. Schedule an appointment as soon as possible for a visit in 2 weeks.   Specialty:  Internal Medicine   Why:  for hospital follow-up   Contact information:   [place] [address] [phone]        Time spent on Discharge: 35 minutes  Signed:   Aniston Christman M.D. [facility] 03/05/2014, 1:03 PM Pager: [phone]

## 2014-03-07 ENCOUNTER — Encounter (HOSPITAL_COMMUNITY): Payer: Self-pay | Admitting: General Surgery

## 2014-03-08 LAB — BODY FLUID CULTURE

## 2014-03-10 LAB — CULTURE, BLOOD (ROUTINE X 2)
CULTURE: NO GROWTH
Culture: NO GROWTH

## 2014-03-15 ENCOUNTER — Ambulatory Visit (INDEPENDENT_AMBULATORY_CARE_PROVIDER_SITE_OTHER): Payer: Commercial Managed Care - HMO | Admitting: Internal Medicine

## 2014-03-15 DIAGNOSIS — Z09 Encounter for follow-up examination after completed treatment for conditions other than malignant neoplasm: Secondary | ICD-10-CM

## 2014-03-15 DIAGNOSIS — M65841 Other synovitis and tenosynovitis, right hand: Secondary | ICD-10-CM | POA: Diagnosis not present

## 2014-03-15 DIAGNOSIS — M659 Synovitis and tenosynovitis, unspecified: Secondary | ICD-10-CM

## 2014-03-15 LAB — BASIC METABOLIC PANEL
BUN: 16 mg/dL (ref 6–23)
CO2: 29 mEq/L (ref 19–32)
CREATININE: 1.05 mg/dL (ref 0.50–1.35)
Calcium: 9 mg/dL (ref 8.4–10.5)
Chloride: 102 mEq/L (ref 96–112)
GLUCOSE: 75 mg/dL (ref 70–99)
Potassium: 3.9 mEq/L (ref 3.5–5.3)
Sodium: 140 mEq/L (ref 135–145)

## 2014-03-15 NOTE — Patient Instructions (Signed)
General Instructions:   Thank you for bringing your medicines today. This helps [place] keep you safe from mistakes.   Progress Toward Treatment Goals:  Treatment Goal 11/05/2013  Blood pressure at goal    Self Care Goals & Plans:  Self Care Goal 11/05/2013  Manage my medications take my medicines as prescribed; bring my medications to every visit; refill my medications on time  Monitor my health -  Eat healthy foods -  Be physically active -  Meeting treatment goals -    No flowsheet data found.   Care Management & Community Referrals:  Referral 11/05/2013  Referrals made for care management support none needed

## 2014-03-15 NOTE — Assessment & Plan Note (Signed)
Patient has completed antibiotics and feels much better. His having some return of motion in his hand. -Appointment made for hand surgery to remove sutures
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
## 2014-03-15 NOTE — Assessment & Plan Note (Signed)
Patient had some AKI in the hospital has not been taking any NSAIDs. -Repeat bmet

## 2014-03-16 NOTE — Progress Notes (Signed)
INTERNAL MEDICINE TEACHING ATTENDING ADDENDUM - Makesha Belitz, MD: I reviewed and discussed at the time of visit with the resident Dr. Sadek, the patient's medical history, physical examination, diagnosis and results of pertinent tests and treatment and I agree with the patient's care as documented.  

## 2014-04-28 ENCOUNTER — Encounter (HOSPITAL_COMMUNITY): Payer: Self-pay | Admitting: Emergency Medicine

## 2014-04-28 ENCOUNTER — Emergency Department (HOSPITAL_COMMUNITY): Payer: Commercial Managed Care - HMO

## 2014-04-28 ENCOUNTER — Emergency Department (HOSPITAL_COMMUNITY)
Admission: EM | Admit: 2014-04-28 | Discharge: 2014-04-28 | Disposition: A | Discharge status: Home / Self Care | Payer: Commercial Managed Care - HMO | Attending: Emergency Medicine | Admitting: Emergency Medicine

## 2014-04-28 DIAGNOSIS — B349 Viral infection, unspecified: Secondary | ICD-10-CM | POA: Diagnosis not present

## 2014-04-28 DIAGNOSIS — J45909 Unspecified asthma, uncomplicated: Secondary | ICD-10-CM | POA: Diagnosis not present

## 2014-04-28 DIAGNOSIS — R05 Cough: Secondary | ICD-10-CM | POA: Diagnosis not present

## 2014-04-28 DIAGNOSIS — Z7982 Long term (current) use of aspirin: Secondary | ICD-10-CM | POA: Insufficient documentation

## 2014-04-28 DIAGNOSIS — M1990 Unspecified osteoarthritis, unspecified site: Secondary | ICD-10-CM | POA: Diagnosis not present

## 2014-04-28 DIAGNOSIS — Z8639 Personal history of other endocrine, nutritional and metabolic disease: Secondary | ICD-10-CM | POA: Insufficient documentation

## 2014-04-28 DIAGNOSIS — Z86711 Personal history of pulmonary embolism: Secondary | ICD-10-CM | POA: Insufficient documentation

## 2014-04-28 DIAGNOSIS — Z87891 Personal history of nicotine dependence: Secondary | ICD-10-CM | POA: Diagnosis not present

## 2014-04-28 DIAGNOSIS — I10 Essential (primary) hypertension: Secondary | ICD-10-CM | POA: Insufficient documentation

## 2014-04-28 DIAGNOSIS — Z79899 Other long term (current) drug therapy: Secondary | ICD-10-CM | POA: Diagnosis not present

## 2014-04-28 DIAGNOSIS — Z8659 Personal history of other mental and behavioral disorders: Secondary | ICD-10-CM | POA: Insufficient documentation

## 2014-04-28 DIAGNOSIS — R531 Weakness: Secondary | ICD-10-CM | POA: Insufficient documentation

## 2014-04-28 DIAGNOSIS — Z8719 Personal history of other diseases of the digestive system: Secondary | ICD-10-CM | POA: Insufficient documentation

## 2014-04-28 DIAGNOSIS — R509 Fever, unspecified: Secondary | ICD-10-CM | POA: Diagnosis not present

## 2014-04-28 DIAGNOSIS — Z87438 Personal history of other diseases of male genital organs: Secondary | ICD-10-CM | POA: Insufficient documentation

## 2014-04-28 LAB — CBC WITH DIFFERENTIAL/PLATELET
BASOS ABS: 0 10*3/uL (ref 0.0–0.1)
BASOS PCT: 1 % (ref 0–1)
EOS ABS: 0.2 10*3/uL (ref 0.0–0.7)
Eosinophils Relative: 3 % (ref 0–5)
HEMATOCRIT: 38.2 % — AB (ref 39.0–52.0)
Hemoglobin: 12.5 g/dL — ABNORMAL LOW (ref 13.0–17.0)
Lymphocytes Relative: 27 % (ref 12–46)
Lymphs Abs: 1.7 10*3/uL (ref 0.7–4.0)
MCH: 26.6 pg (ref 26.0–34.0)
MCHC: 32.7 g/dL (ref 30.0–36.0)
MCV: 81.3 fL (ref 78.0–100.0)
MONO ABS: 0.3 10*3/uL (ref 0.1–1.0)
Monocytes Relative: 5 % (ref 3–12)
NEUTROS ABS: 4.1 10*3/uL (ref 1.7–7.7)
Neutrophils Relative %: 64 % (ref 43–77)
Platelets: 218 10*3/uL (ref 150–400)
RBC: 4.7 MIL/uL (ref 4.22–5.81)
RDW: 14.5 % (ref 11.5–15.5)
WBC: 6.4 10*3/uL (ref 4.0–10.5)

## 2014-04-28 LAB — URINALYSIS, ROUTINE W REFLEX MICROSCOPIC
Bilirubin Urine: NEGATIVE
Glucose, UA: NEGATIVE mg/dL
Hgb urine dipstick: NEGATIVE
Ketones, ur: NEGATIVE mg/dL
LEUKOCYTES UA: NEGATIVE
NITRITE: NEGATIVE
Protein, ur: NEGATIVE mg/dL
SPECIFIC GRAVITY, URINE: 1.017 (ref 1.005–1.030)
Urobilinogen, UA: 1 mg/dL (ref 0.0–1.0)
pH: 5.5 (ref 5.0–8.0)

## 2014-04-28 LAB — COMPREHENSIVE METABOLIC PANEL
ALK PHOS: 53 U/L (ref 39–117)
ALT: 20 U/L (ref 0–53)
AST: 29 U/L (ref 0–37)
Albumin: 3.7 g/dL (ref 3.5–5.2)
Anion gap: 10 (ref 5–15)
BUN: 13 mg/dL (ref 6–23)
CO2: 24 mmol/L (ref 19–32)
Calcium: 9.3 mg/dL (ref 8.4–10.5)
Chloride: 99 mmol/L (ref 96–112)
Creatinine, Ser: 1.29 mg/dL (ref 0.50–1.35)
GFR, EST AFRICAN AMERICAN: 62 mL/min — AB (ref >90)
GFR, EST NON AFRICAN AMERICAN: 54 mL/min — AB (ref >90)
GLUCOSE: 92 mg/dL (ref 70–99)
POTASSIUM: 3.5 mmol/L (ref 3.5–5.1)
SODIUM: 133 mmol/L — AB (ref 135–145)
Total Bilirubin: 0.6 mg/dL (ref 0.3–1.2)
Total Protein: 7.1 g/dL (ref 6.0–8.3)

## 2014-04-28 MED ORDER — SODIUM CHLORIDE 0.9 % IV BOLUS (SEPSIS)
500.0000 mL | Freq: Once | INTRAVENOUS | Status: AC
  Administered 2014-04-28: 500 mL via INTRAVENOUS

## 2014-04-28 MED ORDER — ONDANSETRON 8 MG PO TBDP: 8.0000 mg | ORAL_TABLET | Freq: Three times a day (TID) | ORAL | Status: DC | PRN

## 2014-04-28 MED ORDER — SODIUM CHLORIDE 0.9 % IV SOLN
1000.0000 mL | INTRAVENOUS | Status: DC
  Administered 2014-04-28: 1000 mL via INTRAVENOUS

## 2014-04-28 MED ORDER — SODIUM CHLORIDE 0.9 % IV SOLN
1000.0000 mL | Freq: Once | INTRAVENOUS | Status: AC
  Administered 2014-04-28: 1000 mL via INTRAVENOUS

## 2014-04-28 NOTE — ED Notes (Signed)
Pt is aware he needs a urine sample 

## 2014-04-28 NOTE — ED Notes (Signed)
Patient returned from X-ray 

## 2014-04-28 NOTE — ED Provider Notes (Signed)
CSN: [id number]     Arrival date & time 04/28/14  1038 History   First MD Initiated Contact with Patient 04/28/14 1049     Chief Complaint  Patient presents with  . flu sx   . Fever  . Emesis  . Cough      HPI Pt presents with cough x 3 days and vomiting last night. No hematemesis or diarrhea.  Patient states that this morning he feels very weak.  Decreased oral intake today.  Reports chills without documented fever.  Denies significant shortness of breath but does report dry cough.  Denies rash.  Denies abdominal pain.  No chest pain.  Symptoms are mild to moderate in severity.  Nothing worsens or improves his symptoms.   Past Medical History  Diagnosis Date  . HTN (hypertension)   . BPH (benign prostatic hyperplasia)   . History of hypercholesterolemia   . Other chest pain     Adenosine myoview in 2004 showing EF 52% with no evidence for ischemia.  There was a basal inferior fixed defect that my have been artifact.  . Hypercholesterolemia   . Diverticulitis     colonoscopy in 12/05  . Osteoarthritis     Primarily left knee, acute right knee swelling in 9/07  . Colitis     3-4 ulcers on colonoscopy in 12/05  . Wrist disorder     Proximal carpo-scapholunate dissosiation -seen by Dr. Derryl Harbor  . Depression   . Gastritis   . Syncope     Suspect neurocardiogenic.  Always with standing or micturation.  Had bradycardic/hypotensive response with standing when hospitalized in 1/12.  On Florinef.  Echo (1/12): EF 60-65%, mild aortic root dilation.   . Aortic root dilatation 2012    Seen on Echo in 2012.  23mm  . Asthma, chronic 07/13/2013    PFT's 05/2012 : FEV1 / FVC 75 and 78 pre and post BD. FEV1 74 and 75 pre and post BD. TLC 72. RV 93. DLCO 79. Read as restrictive lung disease, possible with obstructive lung disease. Prior smoker.    . PE (pulmonary thromboembolism) 09/13/2006    In 2008. On Warfarin until 01/2010 when he was admitted for syncope and noticed that he had completed  course of warfarin and it was stopped.    Past Surgical History  Procedure Laterality Date  . Transurethral resection of prostate  3/05    [name]  . I&d extremity Right 03/04/2014    Procedure: IRRIGATION AND DEBRIDEMENT FINGER / HAND;  Surgeon: Dayna Barker, MD;  Location: [facility];  Service: Plastics;  Laterality: Right;  I&D Right Small Finger and Right Wrist   Family History  Problem Relation Age of Onset  . Heart disease Father   . Stroke Father   . Heart disease Brother 22   History  Substance Use Topics  . Smoking status: Former Research scientist (life sciences)  . Smokeless tobacco: Not on file  . Alcohol Use: No    Review of Systems  All other systems reviewed and are negative.     Allergies  Review of patient's allergies indicates no known allergies.  Home Medications   Prior to Admission medications   Medication Sig Start Date End Date Taking? Authorizing Provider  albuterol (PROVENTIL HFA;VENTOLIN HFA) 108 (90 BASE) MCG/ACT inhaler Inhale 2 puffs into the lungs every 6 (six) hours as needed for wheezing or shortness of breath. 02/04/13   [name], MD  aspirin EC 81 MG tablet Take 81 mg by mouth every evening.  Historical Provider, MD  latanoprost (XALATAN) 0.005 % ophthalmic solution Place 1 drop into both eyes at bedtime.    Historical Provider, MD  oxyCODONE (OXY IR/ROXICODONE) 5 MG immediate release tablet Take 1 tablet (5 mg total) by mouth every 4 (four) hours as needed for moderate pain. 03/05/14   Ripudeep Krystal Eaton, MD  polyethylene glycol (MIRALAX / GLYCOLAX) packet Take 17 g by mouth daily. 10/23/13   Wandra Arthurs, MD   BP 123/65 mmHg  Pulse 89  Temp(Src) 100 F (37.8 C) (Oral)  Resp 18  SpO2 97% Physical Exam  Constitutional: He is oriented to person, place, and time. He appears well-developed and well-nourished.  HENT:  Head: Normocephalic and atraumatic.  Eyes: EOM are normal.  Neck: Normal range of motion.  Cardiovascular: Normal rate, regular rhythm, normal heart  sounds and intact distal pulses.   Pulmonary/Chest: Effort normal and breath sounds normal. No respiratory distress.  Abdominal: Soft. He exhibits no distension. There is no tenderness.  Musculoskeletal: Normal range of motion.  Neurological: He is alert and oriented to person, place, and time.  Skin: Skin is warm and dry.  Psychiatric: He has a normal mood and affect. Judgment normal.  Nursing note and vitals reviewed.   ED Course  Procedures (including critical care time) Labs Review Labs Reviewed  CBC WITH DIFFERENTIAL/PLATELET - Abnormal; Notable for the following:    Hemoglobin 12.5 (*)    HCT 38.2 (*)    All other components within normal limits  COMPREHENSIVE METABOLIC PANEL - Abnormal; Notable for the following:    Sodium 133 (*)    GFR calc non Af Amer 54 (*)    GFR calc Af Amer 62 (*)    All other components within normal limits  URINALYSIS, ROUTINE W REFLEX MICROSCOPIC    Imaging Review Dg Chest 2 View  04/28/2014   CLINICAL DATA:  Flu-like symptoms for 1 week with fever and cough  EXAM: CHEST  2 VIEW  COMPARISON:  CT scan of the chest of October 22, 2013 and PA and lateral chest x-ray of April 05, 2012  FINDINGS: The lungs are adequately inflated. There is no focal infiltrate. There is no pleural effusion. The heart and mediastinal structures are normal. The pulmonary vascularity is not engorged. The bony thorax exhibits no acute abnormality.  IMPRESSION: There is no pneumonia nor other active cardiopulmonary disease.   Electronically Signed   By: David  Martinique   On: 04/28/2014 12:30  I personally reviewed the imaging tests through PACS system I reviewed available ER/hospitalization records through the EMR    EKG Interpretation None      MDM   Final diagnoses:  Weakness  Viral syndrome    2:24 PM Patient feels better at this time.  Hydrated in the emergency department.  Likely viral-like process with mild dehydration.  Labs and urine without abnormality.   Chest x-ray without pneumonia.  Discharge home in good condition.  Close primary care follow-up.  Patient understands return to the ER for new or worsening symptoms.  Pulse rate improved.  Blood pressure improved.    Jola Schmidt, MD 04/28/14 [phone]

## 2014-04-28 NOTE — ED Notes (Signed)
Pt states has flu like sx started 2 days ago, cough, fever, vomiting--

## 2014-04-28 NOTE — ED Notes (Signed)
Patient transported to X-ray 

## 2014-07-30 ENCOUNTER — Other Ambulatory Visit: Payer: Self-pay | Admitting: Internal Medicine

## 2014-08-02 NOTE — Telephone Encounter (Signed)
pls ask him to sch appt with me fall or early winter. Sooner only if he prefers.

## 2014-08-25 ENCOUNTER — Emergency Department (HOSPITAL_COMMUNITY): Payer: Commercial Managed Care - HMO

## 2014-08-25 ENCOUNTER — Emergency Department (HOSPITAL_COMMUNITY)
Admission: EM | Admit: 2014-08-25 | Discharge: 2014-08-25 | Disposition: A | Discharge status: Home / Self Care | Payer: Commercial Managed Care - HMO | Attending: Emergency Medicine | Admitting: Emergency Medicine

## 2014-08-25 ENCOUNTER — Encounter (HOSPITAL_COMMUNITY): Payer: Self-pay | Admitting: *Deleted

## 2014-08-25 DIAGNOSIS — M109 Gout, unspecified: Secondary | ICD-10-CM | POA: Diagnosis not present

## 2014-08-25 DIAGNOSIS — Z8719 Personal history of other diseases of the digestive system: Secondary | ICD-10-CM | POA: Diagnosis not present

## 2014-08-25 DIAGNOSIS — Z8639 Personal history of other endocrine, nutritional and metabolic disease: Secondary | ICD-10-CM | POA: Insufficient documentation

## 2014-08-25 DIAGNOSIS — Z79899 Other long term (current) drug therapy: Secondary | ICD-10-CM | POA: Diagnosis not present

## 2014-08-25 DIAGNOSIS — I10 Essential (primary) hypertension: Secondary | ICD-10-CM | POA: Insufficient documentation

## 2014-08-25 DIAGNOSIS — Z86711 Personal history of pulmonary embolism: Secondary | ICD-10-CM | POA: Insufficient documentation

## 2014-08-25 DIAGNOSIS — M19072 Primary osteoarthritis, left ankle and foot: Secondary | ICD-10-CM | POA: Diagnosis not present

## 2014-08-25 DIAGNOSIS — Z87438 Personal history of other diseases of male genital organs: Secondary | ICD-10-CM | POA: Insufficient documentation

## 2014-08-25 DIAGNOSIS — Z8659 Personal history of other mental and behavioral disorders: Secondary | ICD-10-CM | POA: Insufficient documentation

## 2014-08-25 DIAGNOSIS — J45909 Unspecified asthma, uncomplicated: Secondary | ICD-10-CM | POA: Insufficient documentation

## 2014-08-25 DIAGNOSIS — M2142 Flat foot [pes planus] (acquired), left foot: Secondary | ICD-10-CM | POA: Diagnosis not present

## 2014-08-25 DIAGNOSIS — M2012 Hallux valgus (acquired), left foot: Secondary | ICD-10-CM | POA: Diagnosis not present

## 2014-08-25 DIAGNOSIS — M7989 Other specified soft tissue disorders: Secondary | ICD-10-CM | POA: Diagnosis present

## 2014-08-25 DIAGNOSIS — Z87891 Personal history of nicotine dependence: Secondary | ICD-10-CM | POA: Diagnosis not present

## 2014-08-25 DIAGNOSIS — R42 Dizziness and giddiness: Secondary | ICD-10-CM | POA: Insufficient documentation

## 2014-08-25 DIAGNOSIS — R0602 Shortness of breath: Secondary | ICD-10-CM | POA: Diagnosis not present

## 2014-08-25 DIAGNOSIS — Z7982 Long term (current) use of aspirin: Secondary | ICD-10-CM | POA: Insufficient documentation

## 2014-08-25 LAB — CBC
HCT: 39.4 % (ref 39.0–52.0)
Hemoglobin: 13.8 g/dL (ref 13.0–17.0)
MCH: 28 pg (ref 26.0–34.0)
MCHC: 35 g/dL (ref 30.0–36.0)
MCV: 80.1 fL (ref 78.0–100.0)
Platelets: 295 10*3/uL (ref 150–400)
RBC: 4.92 MIL/uL (ref 4.22–5.81)
RDW: 13.5 % (ref 11.5–15.5)
WBC: 6.7 10*3/uL (ref 4.0–10.5)

## 2014-08-25 LAB — TROPONIN I

## 2014-08-25 LAB — BASIC METABOLIC PANEL
ANION GAP: 11 (ref 5–15)
BUN: 24 mg/dL — ABNORMAL HIGH (ref 6–20)
CALCIUM: 9.5 mg/dL (ref 8.9–10.3)
CO2: 25 mmol/L (ref 22–32)
Chloride: 99 mmol/L — ABNORMAL LOW (ref 101–111)
Creatinine, Ser: 1.6 mg/dL — ABNORMAL HIGH (ref 0.61–1.24)
GFR calc Af Amer: 48 mL/min — ABNORMAL LOW (ref >60)
GFR calc non Af Amer: 41 mL/min — ABNORMAL LOW (ref >60)
Glucose, Bld: 78 mg/dL (ref 65–99)
Potassium: 3.7 mmol/L (ref 3.5–5.1)
Sodium: 135 mmol/L (ref 135–145)

## 2014-08-25 LAB — URIC ACID: URIC ACID, SERUM: 9.8 mg/dL — AB (ref 4.4–7.6)

## 2014-08-25 MED ORDER — HYDROCODONE-ACETAMINOPHEN 5-325 MG PO TABS: 1.0000 | ORAL_TABLET | ORAL | Status: DC | PRN

## 2014-08-25 MED ORDER — PREDNISONE 50 MG PO TABS: ORAL_TABLET | ORAL | Status: DC

## 2014-08-25 MED ORDER — SODIUM CHLORIDE 0.9 % IV BOLUS (SEPSIS)
1000.0000 mL | Freq: Once | INTRAVENOUS | Status: AC
  Administered 2014-08-25: 1000 mL via INTRAVENOUS

## 2014-08-25 NOTE — ED Notes (Signed)
Spoke with U[facility]S regarding venous doppler.  Given another number to call.  That number was a recording.  Paged after hour number.  Waiting for return call.

## 2014-08-25 NOTE — ED Provider Notes (Signed)
CSN: [id number]     Arrival date & time 08/25/14  1514 History   First MD Initiated Contact with Patient 08/25/14 1735     Chief Complaint  Patient presents with  . Foot Swelling  . Hypotension     (Consider location/radiation/quality/duration/timing/severity/associated sxs/prior Treatment) HPI Comments: Patient describes dizziness as lightheadedness ongoing for the past 3 days worse with position change. He checked his blood pressure with his home monitor and found in the 80s. He states compliance with his blood pressure medications including hydrochlorothiazide and lisinopril. No recent changes. Normally has blood pressure in the 120 range. On arrival his blood pressure is normal. He denies any chest pain or shortness of breath. He denies any vertigo. No focal weakness, numbness or tingling. Also endorses a three-day history of left foot pain and swelling. No injury. No fever. Remote history of PE no longer anticoagulated. Denies any calf pain or swelling.  The history is provided by the patient and a relative.    Past Medical History  Diagnosis Date  . HTN (hypertension)   . BPH (benign prostatic hyperplasia)   . History of hypercholesterolemia   . Other chest pain     Adenosine myoview in 2004 showing EF 52% with no evidence for ischemia.  There was a basal inferior fixed defect that my have been artifact.  . Hypercholesterolemia   . Diverticulitis     colonoscopy in 12/05  . Osteoarthritis     Primarily left knee, acute right knee swelling in 9/07  . Colitis     3-4 ulcers on colonoscopy in 12/05  . Wrist disorder     Proximal carpo-scapholunate dissosiation -seen by Dr. Derryl Harbor  . Depression   . Gastritis   . Syncope     Suspect neurocardiogenic.  Always with standing or micturation.  Had bradycardic/hypotensive response with standing when hospitalized in 1/12.  On Florinef.  Echo (1/12): EF 60-65%, mild aortic root dilation.   . Aortic root dilatation 2012    Seen on Echo  in 2012.  43mm  . Asthma, chronic 07/13/2013    PFT's 05/2012 : FEV1 / FVC 75 and 78 pre and post BD. FEV1 74 and 75 pre and post BD. TLC 72. RV 93. DLCO 79. Read as restrictive lung disease, possible with obstructive lung disease. Prior smoker.    . PE (pulmonary thromboembolism) 09/13/2006    In 2008. On Warfarin until 01/2010 when he was admitted for syncope and noticed that he had completed course of warfarin and it was stopped.    Past Surgical History  Procedure Laterality Date  . Transurethral resection of prostate  3/05    [name]  . I&d extremity Right 03/04/2014    Procedure: IRRIGATION AND DEBRIDEMENT FINGER / HAND;  Surgeon: Dayna Barker, MD;  Location: [facility];  Service: Plastics;  Laterality: Right;  I&D Right Small Finger and Right Wrist   Family History  Problem Relation Age of Onset  . Heart disease Father   . Stroke Father   . Heart disease Brother 59   History  Substance Use Topics  . Smoking status: Former Research scientist (life sciences)  . Smokeless tobacco: Not on file  . Alcohol Use: No    Review of Systems  Constitutional: Positive for fatigue. Negative for fever, activity change and appetite change.  HENT: Negative for congestion and rhinorrhea.   Respiratory: Negative for cough, chest tightness and shortness of breath.   Cardiovascular: Negative for chest pain.  Gastrointestinal: Negative for nausea, vomiting and abdominal pain.  Genitourinary: Negative for dysuria, hematuria and testicular pain.  Musculoskeletal: Positive for myalgias and arthralgias.  Skin: Negative for rash.  Neurological: Positive for dizziness and light-headedness. Negative for weakness, numbness and headaches.  Psychiatric/Behavioral: Negative for suicidal ideas.  A complete 10 system review of systems was obtained and all systems are negative except as noted in the HPI and PMH.      Allergies  Review of patient's allergies indicates no known allergies.  Home Medications   Prior to Admission  medications   Medication Sig Start Date End Date Taking? Authorizing Provider  acetaminophen (TYLENOL) 500 MG tablet Take 500 mg by mouth every 6 (six) hours as needed for mild pain or moderate pain.   Yes Historical Provider, MD  albuterol (PROVENTIL HFA;VENTOLIN HFA) 108 (90 BASE) MCG/ACT inhaler Inhale 2 puffs into the lungs every 6 (six) hours as needed for wheezing or shortness of breath. 02/04/13  Yes [name], MD  aspirin EC 81 MG tablet Take 81 mg by mouth daily.    Yes Historical Provider, MD  hydrochlorothiazide (MICROZIDE) 12.5 MG capsule TAKE 1 CAPSULE (12.5 MG TOTAL) BY MOUTH DAILY. 08/02/14  Yes Bartholomew Crews, MD  lisinopril (PRINIVIL,ZESTRIL) 20 MG tablet TAKE ONE TABLET DAILY 08/02/14  Yes Bartholomew Crews, MD  HYDROcodone-acetaminophen (NORCO/VICODIN) 5-325 MG per tablet Take 1 tablet by mouth every 4 (four) hours as needed. 08/25/14   Ezequiel Essex, MD  ondansetron (ZOFRAN ODT) 8 MG disintegrating tablet Take 1 tablet (8 mg total) by mouth every 8 (eight) hours as needed for nausea or vomiting. Patient not taking: Reported on 08/25/2014 04/28/14   Jola Schmidt, MD  oxyCODONE (OXY IR/ROXICODONE) 5 MG immediate release tablet Take 1 tablet (5 mg total) by mouth every 4 (four) hours as needed for moderate pain. Patient not taking: Reported on 04/28/2014 03/05/14   Ripudeep Krystal Eaton, MD  polyethylene glycol (MIRALAX / GLYCOLAX) packet Take 17 g by mouth daily. Patient not taking: Reported on 04/28/2014 10/23/13   Wandra Arthurs, MD  predniSONE (DELTASONE) 50 MG tablet 1 tablet PO daily 08/25/14   Ezequiel Essex, MD   BP 134/81 mmHg  Pulse 60  Temp(Src) 98.7 F (37.1 C) (Oral)  Resp 14  SpO2 100% Physical Exam  Constitutional: He is oriented to person, place, and time. He appears well-developed and well-nourished. No distress.  HENT:  Head: Normocephalic and atraumatic.  Mouth/Throat: Oropharynx is clear and moist. No oropharyngeal exudate.  Eyes: Conjunctivae and EOM are  normal. Pupils are equal, round, and reactive to light.  Neck: Normal range of motion. Neck supple.  No meningismus.  Cardiovascular: Normal rate, regular rhythm, normal heart sounds and intact distal pulses.   No murmur heard. Pulmonary/Chest: Effort normal and breath sounds normal. No respiratory distress.  Abdominal: Soft. There is no tenderness. There is no rebound and no guarding.  Musculoskeletal: Normal range of motion. He exhibits tenderness. He exhibits no edema.  Erythema and edema to left first MTP joint. Diffuse swelling of dorsal foot. Intact DP and PT pulses. No calf tenderness or asymmetry  Neurological: He is alert and oriented to person, place, and time. No cranial nerve deficit. He exhibits normal muscle tone. Coordination normal.  No ataxia on finger to nose bilaterally. No pronator drift. 5/5 strength throughout. CN 2-12 intact. Negative Romberg. Equal grip strength. Sensation intact. Gait is normal.   Skin: Skin is warm.  Psychiatric: He has a normal mood and affect. His behavior is normal.  Nursing note and vitals  reviewed.   ED Course  Procedures (including critical care time) Labs Review Labs Reviewed  BASIC METABOLIC PANEL - Abnormal; Notable for the following:    Chloride 99 (*)    BUN 24 (*)    Creatinine, Ser 1.60 (*)    GFR calc non Af Amer 41 (*)    GFR calc Af Amer 48 (*)    All other components within normal limits  URIC ACID - Abnormal; Notable for the following:    Uric Acid, Serum 9.8 (*)    All other components within normal limits  CBC  TROPONIN I    Imaging Review Dg Chest 2 View  08/25/2014   CLINICAL DATA:  Hypertension, dizziness shortness of breath  EXAM: CHEST  2 VIEW  COMPARISON:  04/28/2014  FINDINGS: The heart size and mediastinal contours are within normal limits. Both lungs are clear. The visualized skeletal structures are unremarkable except for minor thoracic degenerative change.  IMPRESSION: No active cardiopulmonary disease.    Electronically Signed   By: Jerilynn Mages.  Shick M.D.   On: 08/25/2014 19:27   Dg Foot Complete Left  08/25/2014   CLINICAL DATA:  Left foot pain and swelling for the past week. No known injury.  EXAM: LEFT FOOT - COMPLETE 3+ VIEW  COMPARISON:  10/04/2011.  FINDINGS: Stable hallux valgus and mild secondary degenerative changes. Mild distal soft tissue swelling. Flattening of the normal plantar arch is again demonstrated as well as mild calcaneal spur formation. Mild dorsal distal soft tissue swelling. No bone destruction or periosteal reaction and no soft tissue gas.  IMPRESSION: 1. Distal soft tissue swelling without underlying bony abnormality. 2. Hallux valgus and mild first MTP joint degenerative changes. 3. Pes planus.   Electronically Signed   By: Claudie Revering M.D.   On: 08/25/2014 19:29     EKG Interpretation   Date/Time:  Thursday August 25 2014 15:51:26 EDT Ventricular Rate:  68 PR Interval:  198 QRS Duration: 98 QT Interval:  378 QTC Calculation: 401 R Axis:   -13 Text Interpretation:  Normal sinus rhythm Incomplete right bundle branch  block Minimal voltage criteria for LVH, may be normal variant No  significant change since last tracing Confirmed by Maryan Rued  MD, Loree Fee  [phone]) on 08/25/2014 5:39:29 PM      MDM   Final diagnoses:  Gout of big toe  Dizziness   positional lightheadedness worse with standing. No chest pain or shortness of breath. EKG is unchanged. Exam of left foot consistent with gout flare.  Creatinine 1.6. Baseline appears to be 1-1.2. X-ray negative for acute traumatic pathology  Doppler gone for the day.   Will treat for gout exacerbation.  Avoid NSAIDs given history of CKD. Advised patient needs to have recheck of CR by PCP next week. Return precautions discussed.  Ezequiel Essex, MD 08/26/14 [phone]

## 2014-08-25 NOTE — ED Notes (Addendum)
Pt states that he has had lower bp for several days. Pt also reports left foot pain and swelling. No injury reported. Pulses present tender to touch.  Pt reports dizziness and feeling like he might pass out. Pt denies this at this time though.

## 2014-08-25 NOTE — ED Notes (Signed)
Ambulated in hall.  O2 sat noted to range between 94-100%.  Tolerated well.

## 2014-08-25 NOTE — Discharge Instructions (Signed)
Gout Return tomorrow for an ultrasound of your leg to rule out blood clot. Follow-up with your doctor for recheck either kidney function next week. Take the pain medication and anti-inflammatory as prescribed. Return to the ED if you develop new or worsening symptoms. Gout is an inflammatory arthritis caused by a buildup of uric acid crystals in the joints. Uric acid is a chemical that is normally present in the blood. When the level of uric acid in the blood is too high it can form crystals that deposit in your joints and tissues. This causes joint redness, soreness, and swelling (inflammation). Repeat attacks are common. Over time, uric acid crystals can form into masses (tophi) near a joint, destroying bone and causing disfigurement. Gout is treatable and often preventable. CAUSES  The disease begins with elevated levels of uric acid in the blood. Uric acid is produced by your body when it breaks down a naturally found substance called purines. Certain foods you eat, such as meats and fish, contain high amounts of purines. Causes of an elevated uric acid level include:  Being passed down from parent to child (heredity).  Diseases that cause increased uric acid production (such as obesity, psoriasis, and certain cancers).  Excessive alcohol use.  Diet, especially diets rich in meat and seafood.  Medicines, including certain cancer-fighting medicines (chemotherapy), water pills (diuretics), and aspirin.  Chronic kidney disease. The kidneys are no longer able to remove uric acid well.  Problems with metabolism. Conditions strongly associated with gout include:  Obesity.  High blood pressure.  High cholesterol.  Diabetes. Not everyone with elevated uric acid levels gets gout. It is not understood why some people get gout and others do not. Surgery, joint injury, and eating too much of certain foods are some of the factors that can lead to gout attacks. SYMPTOMS   An attack of gout comes  on quickly. It causes intense pain with redness, swelling, and warmth in a joint.  Fever can occur.  Often, only one joint is involved. Certain joints are more commonly involved:  Base of the big toe.  Knee.  Ankle.  Wrist.  Finger. Without treatment, an attack usually goes away in a few days to weeks. Between attacks, you usually will not have symptoms, which is different from many other forms of arthritis. DIAGNOSIS  Your caregiver will suspect gout based on your symptoms and exam. In some cases, tests may be recommended. The tests may include:  Blood tests.  Urine tests.  X-rays.  Joint fluid exam. This exam requires a needle to remove fluid from the joint (arthrocentesis). Using a microscope, gout is confirmed when uric acid crystals are seen in the joint fluid. TREATMENT  There are two phases to gout treatment: treating the sudden onset (acute) attack and preventing attacks (prophylaxis).  Treatment of an Acute Attack.  Medicines are used. These include anti-inflammatory medicines or steroid medicines.  An injection of steroid medicine into the affected joint is sometimes necessary.  The painful joint is rested. Movement can worsen the arthritis.  You may use warm or cold treatments on painful joints, depending which works best for you.  Treatment to Prevent Attacks.  If you suffer from frequent gout attacks, your caregiver may advise preventive medicine. These medicines are started after the acute attack subsides. These medicines either help your kidneys eliminate uric acid from your body or decrease your uric acid production. You may need to stay on these medicines for a very long time.  The early phase  of treatment with preventive medicine can be associated with an increase in acute gout attacks. For this reason, during the first few months of treatment, your caregiver may also advise you to take medicines usually used for acute gout treatment. Be sure you  understand your caregiver's directions. Your caregiver may make several adjustments to your medicine dose before these medicines are effective.  Discuss dietary treatment with your caregiver or dietitian. Alcohol and drinks high in sugar and fructose and foods such as meat, poultry, and seafood can increase uric acid levels. Your caregiver or dietitian can advise you on drinks and foods that should be limited. HOME CARE INSTRUCTIONS   Do not take aspirin to relieve pain. This raises uric acid levels.  Only take over-the-counter or prescription medicines for pain, discomfort, or fever as directed by your caregiver.  Rest the joint as much as possible. When in bed, keep sheets and blankets off painful areas.  Keep the affected joint raised (elevated).  Apply warm or cold treatments to painful joints. Use of warm or cold treatments depends on which works best for you.  Use crutches if the painful joint is in your leg.  Drink enough fluids to keep your urine clear or pale yellow. This helps your body get rid of uric acid. Limit alcohol, sugary drinks, and fructose drinks.  Follow your dietary instructions. Pay careful attention to the amount of protein you eat. Your daily diet should emphasize fruits, vegetables, whole grains, and fat-free or low-fat milk products. Discuss the use of coffee, vitamin C, and cherries with your caregiver or dietitian. These may be helpful in lowering uric acid levels.  Maintain a healthy body weight. SEEK MEDICAL CARE IF:   You develop diarrhea, vomiting, or any side effects from medicines.  You do not feel better in 24 hours, or you are getting worse. SEEK IMMEDIATE MEDICAL CARE IF:   Your joint becomes suddenly more tender, and you have chills or a fever. MAKE SURE YOU:   Understand these instructions.  Will watch your condition.  Will get help right away if you are not doing well or get worse. Document Released: 01/12/2000 Document Revised:  05/31/2013 Document Reviewed: 08/28/2011 [facility] Patient Information 2015 [place], [place]. This information is not intended to replace advice given to you by your health care provider. Make sure you discuss any questions you have with your health care provider.

## 2014-08-25 NOTE — ED Notes (Signed)
Spoke to lab and they can add on Troponin and Uric Acid.

## 2014-08-26 ENCOUNTER — Ambulatory Visit (HOSPITAL_COMMUNITY)
Admission: RE | Admit: 2014-08-26 | Discharge: 2014-08-26 | Disposition: A | Discharge status: Home / Self Care | Payer: Commercial Managed Care - HMO | Source: Ambulatory Visit | Attending: Emergency Medicine | Admitting: Emergency Medicine

## 2014-08-26 DIAGNOSIS — M7989 Other specified soft tissue disorders: Secondary | ICD-10-CM

## 2014-08-26 NOTE — Progress Notes (Signed)
Left lower extremity venous duplex completed.  Left:  No evidence of DVT, superficial thrombosis, or Baker's cyst.  Right:  Negative for DVT in the common femoral vein.  

## 2014-11-17 ENCOUNTER — Ambulatory Visit: Payer: Medicare HMO | Admitting: Internal Medicine

## 2014-12-08 ENCOUNTER — Ambulatory Visit: Payer: Medicare HMO | Admitting: Internal Medicine

## 2015-03-02 ENCOUNTER — Ambulatory Visit: Payer: Medicare HMO | Admitting: Internal Medicine

## 2015-03-02 ENCOUNTER — Encounter: Payer: Self-pay | Admitting: Internal Medicine

## 2015-03-30 ENCOUNTER — Encounter: Payer: Medicare HMO | Admitting: Internal Medicine

## 2015-03-31 ENCOUNTER — Encounter: Payer: Self-pay | Admitting: Pharmacist

## 2015-03-31 NOTE — Progress Notes (Signed)
Medication list updated.

## 2015-04-13 ENCOUNTER — Encounter: Payer: Self-pay | Admitting: Internal Medicine

## 2015-04-13 ENCOUNTER — Ambulatory Visit (INDEPENDENT_AMBULATORY_CARE_PROVIDER_SITE_OTHER): Payer: Commercial Managed Care - HMO | Admitting: Internal Medicine

## 2015-04-13 VITALS — BP 121/79 | HR 60 | Temp 98.3°F | Wt 189.3 lb

## 2015-04-13 DIAGNOSIS — I10 Essential (primary) hypertension: Secondary | ICD-10-CM | POA: Diagnosis not present

## 2015-04-13 DIAGNOSIS — D6489 Other specified anemias: Secondary | ICD-10-CM

## 2015-04-13 DIAGNOSIS — R0602 Shortness of breath: Secondary | ICD-10-CM | POA: Diagnosis not present

## 2015-04-13 DIAGNOSIS — J4520 Mild intermittent asthma, uncomplicated: Secondary | ICD-10-CM

## 2015-04-13 DIAGNOSIS — Z7951 Long term (current) use of inhaled steroids: Secondary | ICD-10-CM

## 2015-04-13 DIAGNOSIS — Z23 Encounter for immunization: Secondary | ICD-10-CM | POA: Diagnosis not present

## 2015-04-13 DIAGNOSIS — Z0000 Encounter for general adult medical examination without abnormal findings: Secondary | ICD-10-CM

## 2015-04-13 DIAGNOSIS — Z87448 Personal history of other diseases of urinary system: Secondary | ICD-10-CM

## 2015-04-13 DIAGNOSIS — R319 Hematuria, unspecified: Secondary | ICD-10-CM

## 2015-04-13 DIAGNOSIS — J45909 Unspecified asthma, uncomplicated: Secondary | ICD-10-CM

## 2015-04-13 DIAGNOSIS — Z7189 Other specified counseling: Secondary | ICD-10-CM

## 2015-04-13 DIAGNOSIS — I712 Thoracic aortic aneurysm, without rupture, unspecified: Secondary | ICD-10-CM

## 2015-04-13 DIAGNOSIS — E538 Deficiency of other specified B group vitamins: Secondary | ICD-10-CM

## 2015-04-13 MED ORDER — HYDROCHLOROTHIAZIDE 12.5 MG PO CAPS: 12.5000 mg | ORAL_CAPSULE | Freq: Every day | ORAL | Status: DC

## 2015-04-13 MED ORDER — LISINOPRIL 20 MG PO TABS: 20.0000 mg | ORAL_TABLET | Freq: Every day | ORAL | Status: DC

## 2015-04-13 MED ORDER — ALBUTEROL SULFATE HFA 108 (90 BASE) MCG/ACT IN AERS: 2.0000 | INHALATION_SPRAY | Freq: Four times a day (QID) | RESPIRATORY_TRACT | Status: DC | PRN

## 2015-04-13 NOTE — Patient Instructions (Signed)
1. You got the pneumonia 13 and flu vaccine today 2. See me in 6 months 3. I sent refills to your pharmacy on all your medicines 4. I will send you your test results

## 2015-04-13 NOTE — Assessment & Plan Note (Signed)
Gets coughing spells after being outside and thinks exposed to pollen. Uses alb and that helps the coughing. No SOB nor DOE. Was tired and weak and SOB when had flu recently.  PLAN : refill alb PRN

## 2015-04-13 NOTE — Progress Notes (Signed)
   Subjective:    Patient ID: [name], male    DOB: April 30, 1940, 75 y.o.   MRN: [id number]  HPI  RAZIEL CAMIRE is here for HTN F/U. Please see the A&P for the status of the pt's chronic medical problems.  ROS : per ROS section and in problem oriented charting. All other systems are negative. PMHx, Soc hx, and / or Fam hx : Brother died 3-4 months ago from lung Ca. He smoked. Another brother died from 26 kidney cancer.   Review of Systems  Constitutional: Positive for unexpected weight change. Negative for fever and fatigue.  Eyes: Positive for discharge. Negative for pain.  Respiratory: Positive for cough. Negative for shortness of breath.        No DOE  Cardiovascular: Negative for leg swelling.  Gastrointestinal:       No "good appetite." No dysphagia. Food tastes good.  Musculoskeletal:       R fingers stiff after surgery. No pain.       Objective:   Physical Exam  Constitutional: He appears well-developed and well-nourished. No distress.  HENT:  Head: Normocephalic and atraumatic.  Right Ear: External ear normal.  Left Ear: External ear normal.  Nose: Nose normal.  Eyes: Conjunctivae and EOM are normal.  Cardiovascular: Normal rate, regular rhythm and normal heart sounds.   Pulmonary/Chest: Effort normal and breath sounds normal. No respiratory distress.  Abdominal: Bowel sounds are normal.  Musculoskeletal: He exhibits no edema or tenderness.  cannot fully flex DIP and PIP on R hand  Neurological: He is alert.  Skin: Skin is warm and dry. He is not diaphoretic.  Psychiatric: He has a normal mood and affect. His behavior is normal. Judgment and thought content normal.          Assessment & Plan:

## 2015-04-13 NOTE — Assessment & Plan Note (Addendum)
Flu and PCV 13 today.  He has had gradual weight loss. He states his appeitie is no longer good but that he likes to eat. No other sxs. He does a bit of cooking. Recently had flu and that also caused weight loss.  PLAN : Follow

## 2015-04-13 NOTE — Assessment & Plan Note (Signed)
He lives with his daughter. He states his granddaughter Caroline More is his medical POA. He chose her bc she has more education than his daughter. When confronted with scenarios, he chose aggressive care "bc that is what my family would want." Doesn't want to end up in NH / SNF but might have to go if things get bad. Would accept to transition to non aggressive care if the doctors felt that recovery unlikely.

## 2015-04-13 NOTE — Assessment & Plan Note (Signed)
We discussed that the rec is to monitor. He prefers a paternalistic approach to his care. [facility]KS[facility] suggests annual ECHO since his aortic valve is nl. UTD suggests biannual CT or MRI since sporadic TAA. I doubt his Cr will tolerate a CT but will wait to see Cr and then will likely order an ECHO - less radiation, cost, and contrast.  PLAN : check Cr then likely ECHO

## 2015-04-13 NOTE — Assessment & Plan Note (Signed)
B 12 has been low nl twice. Will recheck and add MMA as might require supplementation. No sxs of B 12 def yet. Would be able to use either PO or IM - pt preference.  PLAN : F/U labs

## 2015-04-13 NOTE — Assessment & Plan Note (Signed)
Denies any gross hematuria today.  PLAN : follow

## 2015-04-13 NOTE — Assessment & Plan Note (Signed)
On HCTZ 12.5 and lisinopril 20. Prefers not to combine meds into one pill. BP bit high today but OK for age. Will repeat. Checks at home but stated BP was 98/25 which doesn't seem accurate.   PLAN:  Cont current meds

## 2015-04-14 ENCOUNTER — Encounter: Payer: Self-pay | Admitting: Internal Medicine

## 2015-04-17 LAB — BMP8+ANION GAP
Anion Gap: 15 mmol/L (ref 10.0–18.0)
BUN/Creatinine Ratio: 14 (ref 10–22)
BUN: 17 mg/dL (ref 8–27)
CALCIUM: 9 mg/dL (ref 8.6–10.2)
CHLORIDE: 100 mmol/L (ref 96–106)
CO2: 22 mmol/L (ref 18–29)
CREATININE: 1.24 mg/dL (ref 0.76–1.27)
GFR calc Af Amer: 66 mL/min/{1.73_m2} (ref >59)
GFR calc non Af Amer: 57 mL/min/{1.73_m2} — ABNORMAL LOW (ref >59)
GLUCOSE: 81 mg/dL (ref 65–99)
Potassium: 4.8 mmol/L (ref 3.5–5.2)
Sodium: 137 mmol/L (ref 134–144)

## 2015-04-17 LAB — VITAMIN B12: VITAMIN B 12: 171 pg/mL — AB (ref 211–946)

## 2015-04-17 LAB — METHYLMALONIC ACID, SERUM: Methylmalonic Acid: 264 nmol/L (ref 0–378)

## 2015-04-20 ENCOUNTER — Ambulatory Visit (INDEPENDENT_AMBULATORY_CARE_PROVIDER_SITE_OTHER): Payer: Commercial Managed Care - HMO | Admitting: *Deleted

## 2015-04-20 DIAGNOSIS — E538 Deficiency of other specified B group vitamins: Secondary | ICD-10-CM

## 2015-04-20 MED ORDER — CYANOCOBALAMIN 1000 MCG/ML IJ SOLN
1000.0000 ug | Freq: Once | INTRAMUSCULAR | Status: AC
  Administered 2015-04-20: 1000 ug via INTRAMUSCULAR

## 2015-04-27 ENCOUNTER — Ambulatory Visit (INDEPENDENT_AMBULATORY_CARE_PROVIDER_SITE_OTHER): Payer: Commercial Managed Care - HMO

## 2015-04-27 DIAGNOSIS — E538 Deficiency of other specified B group vitamins: Secondary | ICD-10-CM | POA: Diagnosis not present

## 2015-04-27 MED ORDER — CYANOCOBALAMIN 1000 MCG/ML IJ SOLN
1000.0000 ug | Freq: Once | INTRAMUSCULAR | Status: AC
  Administered 2015-04-27: 1000 ug via INTRAMUSCULAR

## 2015-05-04 ENCOUNTER — Other Ambulatory Visit: Payer: Self-pay | Admitting: Internal Medicine

## 2015-05-04 ENCOUNTER — Ambulatory Visit (INDEPENDENT_AMBULATORY_CARE_PROVIDER_SITE_OTHER): Payer: Commercial Managed Care - HMO | Admitting: *Deleted

## 2015-05-04 DIAGNOSIS — E538 Deficiency of other specified B group vitamins: Secondary | ICD-10-CM

## 2015-05-04 MED ORDER — CYANOCOBALAMIN 1000 MCG/ML IJ SOLN
1000.0000 ug | Freq: Once | INTRAMUSCULAR | Status: AC
  Administered 2015-05-04: 1000 ug via INTRAMUSCULAR

## 2015-05-11 ENCOUNTER — Ambulatory Visit: Payer: Commercial Managed Care - HMO

## 2015-05-15 ENCOUNTER — Ambulatory Visit: Payer: Commercial Managed Care - HMO

## 2015-05-15 ENCOUNTER — Other Ambulatory Visit (INDEPENDENT_AMBULATORY_CARE_PROVIDER_SITE_OTHER): Payer: Commercial Managed Care - HMO

## 2015-05-15 DIAGNOSIS — E538 Deficiency of other specified B group vitamins: Secondary | ICD-10-CM | POA: Diagnosis not present

## 2015-05-16 LAB — VITAMIN B12: Vitamin B-12: 363 pg/mL (ref 211–946)

## 2015-05-23 ENCOUNTER — Encounter: Payer: Self-pay | Admitting: Internal Medicine

## 2015-06-07 ENCOUNTER — Telehealth: Payer: Self-pay

## 2015-06-07 MED ORDER — CYANOCOBALAMIN 1000 MCG PO TABS: 1000.0000 ug | ORAL_TABLET | Freq: Every day | ORAL | Status: DC

## 2015-06-07 NOTE — Telephone Encounter (Signed)
Sent in. I do not know if insurance will cover and if so, whether cheaper OTC. Up to him Thanks

## 2015-06-07 NOTE — Telephone Encounter (Signed)
[name] rec'd your letter and would like to do B12 daily supplement.  Would like med sent to [address]

## 2015-07-03 DIAGNOSIS — H401111 Primary open-angle glaucoma, right eye, mild stage: Secondary | ICD-10-CM | POA: Diagnosis not present

## 2015-07-03 DIAGNOSIS — H401122 Primary open-angle glaucoma, left eye, moderate stage: Secondary | ICD-10-CM | POA: Diagnosis not present

## 2015-08-02 DIAGNOSIS — H401122 Primary open-angle glaucoma, left eye, moderate stage: Secondary | ICD-10-CM | POA: Diagnosis not present

## 2015-08-02 DIAGNOSIS — H401111 Primary open-angle glaucoma, right eye, mild stage: Secondary | ICD-10-CM | POA: Diagnosis not present

## 2015-08-25 ENCOUNTER — Other Ambulatory Visit: Payer: Self-pay | Admitting: Internal Medicine

## 2015-10-11 ENCOUNTER — Telehealth: Payer: Self-pay | Admitting: Internal Medicine

## 2015-10-11 NOTE — Telephone Encounter (Signed)
APT. REMINDER CALL, NO ANSWER, NO VOICEMAIL °

## 2015-10-12 ENCOUNTER — Ambulatory Visit (INDEPENDENT_AMBULATORY_CARE_PROVIDER_SITE_OTHER): Payer: Commercial Managed Care - HMO | Admitting: Internal Medicine

## 2015-10-12 ENCOUNTER — Encounter: Payer: Self-pay | Admitting: Internal Medicine

## 2015-10-12 VITALS — BP 141/57 | HR 58 | Temp 97.9°F | Wt 194.2 lb

## 2015-10-12 DIAGNOSIS — M1A9XX0 Chronic gout, unspecified, without tophus (tophi): Secondary | ICD-10-CM | POA: Diagnosis not present

## 2015-10-12 DIAGNOSIS — G4762 Sleep related leg cramps: Secondary | ICD-10-CM | POA: Diagnosis not present

## 2015-10-12 DIAGNOSIS — I712 Thoracic aortic aneurysm, without rupture, unspecified: Secondary | ICD-10-CM

## 2015-10-12 DIAGNOSIS — Z23 Encounter for immunization: Secondary | ICD-10-CM | POA: Diagnosis not present

## 2015-10-12 DIAGNOSIS — M1A3710 Chronic gout due to renal impairment, right ankle and foot, without tophus (tophi): Secondary | ICD-10-CM

## 2015-10-12 DIAGNOSIS — I10 Essential (primary) hypertension: Secondary | ICD-10-CM | POA: Diagnosis not present

## 2015-10-12 DIAGNOSIS — E538 Deficiency of other specified B group vitamins: Secondary | ICD-10-CM

## 2015-10-12 DIAGNOSIS — J4520 Mild intermittent asthma, uncomplicated: Secondary | ICD-10-CM

## 2015-10-12 DIAGNOSIS — Z0000 Encounter for general adult medical examination without abnormal findings: Secondary | ICD-10-CM

## 2015-10-12 DIAGNOSIS — R0602 Shortness of breath: Secondary | ICD-10-CM

## 2015-10-12 DIAGNOSIS — J45909 Unspecified asthma, uncomplicated: Secondary | ICD-10-CM

## 2015-10-12 MED ORDER — ALBUTEROL SULFATE HFA 108 (90 BASE) MCG/ACT IN AERS: 2.0000 | INHALATION_SPRAY | Freq: Four times a day (QID) | RESPIRATORY_TRACT | 6 refills | Status: DC | PRN

## 2015-10-12 MED ORDER — HYDROCHLOROTHIAZIDE 12.5 MG PO CAPS: 12.5000 mg | ORAL_CAPSULE | Freq: Every day | ORAL | 3 refills | Status: DC

## 2015-10-12 MED ORDER — LISINOPRIL 20 MG PO TABS: 20.0000 mg | ORAL_TABLET | Freq: Every day | ORAL | 3 refills | Status: DC

## 2015-10-12 NOTE — Assessment & Plan Note (Signed)
He is taking 1000 IU B12 QD and no SE. Post IM x4 level was nl.  PLAN:  Cont current meds

## 2015-10-12 NOTE — Assessment & Plan Note (Signed)
Has albuterol to use PRN. Can't remember last time used it.  PLAN:  Cont current meds

## 2015-10-12 NOTE — Assessment & Plan Note (Signed)
Flu shot today.  C/O 6 months cramp in feet and legs at night. He elevates his legs at night 2.2 knee pain. Happens once weekly on average. No claudication sxs. No RLS sxs. He does c/o tired a lot when he walks and has to stop and sit down. Describes some tightness in legs with walking. Requests handicap placard.  A : nocturnal leg cramps  PLAN : OTC B 6 complex Stretching - UTD and pics given Handicap placard - 6 months

## 2015-10-12 NOTE — Assessment & Plan Note (Signed)
This was seen on CT chest and described as stable but I could find no prior study to compare it to. No sxs. Agrees to ECHO (pt prefers paternalistic approach).  PLAN : TTE

## 2015-10-12 NOTE — Patient Instructions (Addendum)
1. Call us when you get the gout - we can help 2. For your cramps - stretch your muscles and try vitamin B 6 complex (over the counter) 3. I will mail you your test results 4. I am ordering picture of heart and blood vessel 5. I sent all of your meds for 3 months

## 2015-10-12 NOTE — Assessment & Plan Note (Addendum)
BP Readings from Last 3 Encounters:  10/12/15 (!) 141/57  04/13/15 121/79  08/25/14 134/81   BP today 141/57. On HCTZ 12.5 and lisinopril 20. Prefers separate pills. Wants 90 day supply. No med SE.   PLAN:  Cont current meds

## 2015-10-12 NOTE — Progress Notes (Signed)
   Subjective:    Patient ID: [name], male    DOB: [birth year], 75 y.o.   MRN: [id number]  HPI  [name] is here for leg cramps. Please see the A&P for the status of the pt's chronic medical problems.  ROS : per ROS section and in problem oriented charting. All other systems are negative.  PMHx, Soc hx, and / or Fam hx : Married.  Has h/o gout - one ED appt 07/2014 and one [facility] appt 02/2014.  Review of Systems  Constitutional: Positive for fatigue. Negative for appetite change and unexpected weight change.  Respiratory: Negative for cough and shortness of breath.   Cardiovascular: Negative for chest pain and leg swelling.  Gastrointestinal: Negative for abdominal pain and constipation.  Musculoskeletal: Positive for gait problem and myalgias.  Skin: Negative for color change and rash.  Psychiatric/Behavioral: Negative for sleep disturbance.       Objective:   Physical Exam  Constitutional: He appears well-developed and well-nourished. No distress.  HENT:  Head: Normocephalic and atraumatic.  Right Ear: External ear normal.  Left Ear: External ear normal.  Nose: Nose normal.  Cardiovascular: Normal rate, regular rhythm and normal heart sounds.   No murmur heard. Pulmonary/Chest: Effort normal and breath sounds normal. No respiratory distress. He has no wheezes.  Musculoskeletal: Normal range of motion. He exhibits edema.  Skin: Skin is warm and dry. No rash noted. He is not diaphoretic. No erythema. No pallor.  Psychiatric: He has a normal mood and affect. His behavior is normal. Judgment and thought content normal.          Assessment & Plan:

## 2015-10-12 NOTE — Assessment & Plan Note (Signed)
States in July had onset of R toes (all 5) pain with swelling, and warmth. Had trouble putting pressure onto foot. Lasted 2 weeks and self treated with APAP, cherry juice concentrate, grapes, and rubbing ETOH. States this was the gout. Reviewed chart - seen in ED 08/25/14 for gout and had [facility] appt 2/16 for gout. Uric acid 9.8 in July 2916. States no more than one attack per yr. GFR 57.  A/P : chronic intermittent gout, could be 2/2 renal inusff. Since attacks so infreq, no need for prophylaxis. To call with next flare.

## 2015-10-13 ENCOUNTER — Encounter: Payer: Self-pay | Admitting: Internal Medicine

## 2015-10-13 LAB — BMP8+ANION GAP
ANION GAP: 21 mmol/L — AB (ref 10.0–18.0)
BUN/Creatinine Ratio: 9 — ABNORMAL LOW (ref 10–24)
BUN: 11 mg/dL (ref 8–27)
CO2: 22 mmol/L (ref 18–29)
Calcium: 9.4 mg/dL (ref 8.6–10.2)
Chloride: 92 mmol/L — ABNORMAL LOW (ref 96–106)
Creatinine, Ser: 1.22 mg/dL (ref 0.76–1.27)
GFR calc Af Amer: 67 mL/min/{1.73_m2} (ref >59)
GFR, EST NON AFRICAN AMERICAN: 58 mL/min/{1.73_m2} — AB (ref >59)
Glucose: 78 mg/dL (ref 65–99)
Potassium: 3.7 mmol/L (ref 3.5–5.2)
Sodium: 135 mmol/L (ref 134–144)

## 2015-10-13 LAB — MAGNESIUM: Magnesium: 1.9 mg/dL (ref 1.6–2.3)

## 2015-10-31 ENCOUNTER — Ambulatory Visit (HOSPITAL_COMMUNITY): Payer: Commercial Managed Care - HMO

## 2015-11-07 ENCOUNTER — Encounter: Payer: Self-pay | Admitting: Internal Medicine

## 2015-11-07 ENCOUNTER — Ambulatory Visit (HOSPITAL_COMMUNITY)
Admission: RE | Admit: 2015-11-07 | Discharge: 2015-11-07 | Disposition: A | Discharge status: Home / Self Care | Payer: Commercial Managed Care - HMO | Source: Ambulatory Visit | Attending: Internal Medicine | Admitting: Internal Medicine

## 2015-11-07 DIAGNOSIS — I119 Hypertensive heart disease without heart failure: Secondary | ICD-10-CM | POA: Diagnosis not present

## 2015-11-07 DIAGNOSIS — I712 Thoracic aortic aneurysm, without rupture, unspecified: Secondary | ICD-10-CM

## 2015-11-07 DIAGNOSIS — E7800 Pure hypercholesterolemia, unspecified: Secondary | ICD-10-CM | POA: Diagnosis not present

## 2015-11-07 DIAGNOSIS — Z87891 Personal history of nicotine dependence: Secondary | ICD-10-CM | POA: Insufficient documentation

## 2015-11-07 LAB — ECHOCARDIOGRAM COMPLETE
CHL CUP MV DEC (S): 243
EERAT: 5.43
EWDT: 243 ms
FS: 32 % (ref 28–44)
IVS/LV PW RATIO, ED: 1.03
LA diam end sys: 36 mm
LA diam index: 1.75 cm/m2
LA vol A4C: 39.3 ml
LA vol index: 25 mL/m2
LA vol: 51.5 mL
LASIZE: 36 mm
LV E/e' medial: 5.43
LV E/e'average: 5.43
LV PW d: 12.4 mm — AB (ref 0.6–1.1)
LV dias vol index: 49 mL/m2
LV dias vol: 100 mL (ref 62–150)
LVELAT: 12.5 cm/s
LVOT SV: 74 mL
LVOT VTI: 21.3 cm
LVOT area: 3.46 cm2
LVOT peak vel: 98 cm/s
LVOTD: 21 mm
MV pk A vel: 52.4 m/s
MV pk E vel: 67.9 m/s
TDI e' lateral: 12.5
TDI e' medial: 9.14

## 2015-11-07 NOTE — Progress Notes (Signed)
  Echocardiogram 2D Echocardiogram has been performed.  Tresa Res 11/07/2015, 4:08 PM

## 2015-12-10 ENCOUNTER — Emergency Department (HOSPITAL_COMMUNITY): Payer: Commercial Managed Care - HMO

## 2015-12-10 ENCOUNTER — Emergency Department (HOSPITAL_COMMUNITY)
Admission: EM | Admit: 2015-12-10 | Discharge: 2015-12-10 | Disposition: A | Discharge status: Home / Self Care | Payer: Commercial Managed Care - HMO | Attending: Emergency Medicine | Admitting: Emergency Medicine

## 2015-12-10 ENCOUNTER — Encounter (HOSPITAL_COMMUNITY): Payer: Self-pay

## 2015-12-10 DIAGNOSIS — Z7982 Long term (current) use of aspirin: Secondary | ICD-10-CM | POA: Diagnosis not present

## 2015-12-10 DIAGNOSIS — I10 Essential (primary) hypertension: Secondary | ICD-10-CM | POA: Insufficient documentation

## 2015-12-10 DIAGNOSIS — J45909 Unspecified asthma, uncomplicated: Secondary | ICD-10-CM | POA: Diagnosis not present

## 2015-12-10 DIAGNOSIS — Z79899 Other long term (current) drug therapy: Secondary | ICD-10-CM | POA: Diagnosis not present

## 2015-12-10 DIAGNOSIS — M546 Pain in thoracic spine: Secondary | ICD-10-CM | POA: Diagnosis not present

## 2015-12-10 DIAGNOSIS — M62830 Muscle spasm of back: Secondary | ICD-10-CM

## 2015-12-10 DIAGNOSIS — Z87891 Personal history of nicotine dependence: Secondary | ICD-10-CM | POA: Diagnosis not present

## 2015-12-10 MED ORDER — DIAZEPAM 5 MG PO TABS: 5.0000 mg | ORAL_TABLET | Freq: Four times a day (QID) | ORAL | 0 refills | Status: AC | PRN

## 2015-12-10 MED ORDER — DIAZEPAM 2 MG PO TABS
2.0000 mg | ORAL_TABLET | Freq: Once | ORAL | Status: AC
  Administered 2015-12-10: 2 mg via ORAL
  Filled 2015-12-10: qty 1

## 2015-12-10 NOTE — ED Triage Notes (Signed)
Patient complains of thoracic back pain x 2 days. Pain increased with any movement. Denies trauma. Alert and oriented, NAD

## 2015-12-10 NOTE — ED Notes (Signed)
Patient returned from X-ray 

## 2015-12-10 NOTE — ED Provider Notes (Signed)
[place] DEPT Provider Note   CSN: [id number] Arrival date & time: 12/10/15  1104     History   Chief Complaint Chief Complaint  Patient presents with  . Back Pain    HPI [name] is a 75 y.o. male presents with 2 day history of upper back pain. He states that pain radiates laterally causing associated chest tightness but denies chest pain and shortness of breath. Patient stated that he used Tylenol which minimally helped him. Patient also states that changing positions make it worse but can walk without back pain and lying down makes it better. Patient states has a cane at home and chooses not to use it. Patient denies chest pain, shortness of breath, nausea, vomiting, diaphoresis, weakness, numbness, abdominal pain, and urinary symptoms.    HPI  Past Medical History:  Diagnosis Date  . Aortic root dilatation ([place]) 2012   Seen on Echo in 2012.  8mm  . Asthma, chronic 07/13/2013   PFT's 05/2012 : FEV1 / FVC 75 and 78 pre and post BD. FEV1 74 and 75 pre and post BD. TLC 72. RV 93. DLCO 79. Read as restrictive lung disease, possible with obstructive lung disease. Prior smoker.    Marland Kitchen BPH (benign prostatic hyperplasia)   . Colitis    3-4 ulcers on colonoscopy in 12/05  . Depression   . Diverticulitis    colonoscopy in 12/05  . Gastritis   . History of hypercholesterolemia   . HTN (hypertension)   . Hypercholesterolemia   . Osteoarthritis    Primarily left knee, acute right knee swelling in 9/07  . Other chest pain    Adenosine myoview in 2004 showing EF 52% with no evidence for ischemia.  There was a basal inferior fixed defect that my have been artifact.  . PE (pulmonary thromboembolism) ([place]) 09/13/2006   In 2008. On Warfarin until 01/2010 when he was admitted for syncope and noticed that he had completed course of warfarin and it was stopped.   . Syncope    Suspect neurocardiogenic.  Always with standing or micturation.  Had bradycardic/hypotensive response with  standing when hospitalized in 1/12.  On Florinef.  Echo (1/12): EF 60-65%, mild aortic root dilation.   . Wrist disorder    Proximal carpo-scapholunate dissosiation -seen by Dr. Derryl Harbor    Patient Active Problem List   Diagnosis Date Noted  . Gout, chronic, without tophus 10/12/2015  . Counseling regarding end of life decision making 04/13/2015  . OA (osteoarthritis) of knee 11/05/2013  . BPH (benign prostatic hyperplasia) 08/04/2013  . Asthma, chronic 07/13/2013  . Healthcare maintenance 11/26/2012  . Glaucoma 06/12/2012  . Hematuria 04/10/2012  . Thoracic aortic aneurysm ([place]) 01/10/2012  . Herpes infection 09/14/2010  . Vitamin B 12 deficiency 02/21/2010  . History of pulmonary embolism 09/13/2006  . Essential hypertension 12/12/2005    Past Surgical History:  Procedure Laterality Date  . I&D EXTREMITY Right 03/04/2014   Procedure: IRRIGATION AND DEBRIDEMENT FINGER / HAND;  Surgeon: Dayna Barker, MD;  Location: [facility];  Service: Plastics;  Laterality: Right;  I&D Right Small Finger and Right Wrist  . TRANSURETHRAL RESECTION OF PROSTATE  3/05   [place] Medications    Prior to Admission medications   Medication Sig Start Date End Date Taking? Authorizing Provider  acetaminophen (TYLENOL) 500 MG tablet Take 500 mg by mouth every 6 (six) hours as needed for mild pain or moderate pain.    Historical Provider,  MD  albuterol (PROVENTIL HFA;VENTOLIN HFA) 108 (90 Base) MCG/ACT inhaler Inhale 2 puffs into the lungs every 6 (six) hours as needed for wheezing or shortness of breath. Patient not taking: Reported on 10/12/2015 10/12/15   Bartholomew Crews, MD  aspirin EC 81 MG tablet Take 81 mg by mouth daily.     Historical Provider, MD  cyanocobalamin ([employer] VITAMIN B12) 1000 MCG tablet Take 1 tablet (1,000 mcg total) by mouth daily. 06/07/15   Bartholomew Crews, MD  diazepam (VALIUM) 5 MG tablet Take 1 tablet (5 mg total) by mouth every 6 (six) hours as needed for muscle  spasms. 12/10/15 12/16/15  Chalice Philbert Manuel Devyon [place], [place]  hydrochlorothiazide (MICROZIDE) 12.5 MG capsule Take 1 capsule (12.5 mg total) by mouth daily. 10/12/15   Bartholomew Crews, MD  latanoprost (XALATAN) 0.005 % ophthalmic solution Place 1 drop into both eyes at bedtime.    Peter K Dunn  lisinopril (PRINIVIL,ZESTRIL) 20 MG tablet Take 1 tablet (20 mg total) by mouth daily. 10/12/15   Bartholomew Crews, MD    Family History Family History  Problem Relation Age of Onset  . Heart disease Father   . Stroke Father   . Heart disease Brother 1    Social History Social History  Substance Use Topics  . Smoking status: Former Research scientist (life sciences)  . Smokeless tobacco: Not on file  . Alcohol use No     Allergies   Patient has no known allergies.   Review of Systems Review of Systems  Constitutional: Negative for chills, diaphoresis and fever.  Respiratory: Positive for chest tightness (During position change and associated with back pain). Negative for cough and shortness of breath.   Cardiovascular: Negative for chest pain and leg swelling.  Gastrointestinal: Negative for abdominal distention, abdominal pain, blood in stool, constipation, diarrhea, nausea and vomiting.  Genitourinary: Negative for difficulty urinating and flank pain.  Musculoskeletal: Positive for back pain.  Skin: Negative for wound.  Neurological: Negative for dizziness, syncope, numbness and headaches.  Psychiatric/Behavioral: Negative for confusion.  All other systems reviewed and are negative.    Physical Exam Updated Vital Signs BP 101/63   Pulse (!) 55   Temp 98.3 F (36.8 C) (Oral)   Resp 18   Ht 5\' 11"  (1.803 m)   Wt 87.5 kg   SpO2 98%   BMI 26.92 kg/m   Physical Exam  Constitutional: He is oriented to person, place, and time. He appears well-developed and well-nourished.  HENT:  Head: Normocephalic.  Eyes: EOM are normal. Pupils are equal, round, and reactive to light.  Neck: Trachea normal and  normal range of motion. No JVD present. Carotid bruit is not present.  Cardiovascular: Normal rate.   Pulmonary/Chest: Effort normal and breath sounds normal. No respiratory distress. He exhibits no tenderness.  Abdominal: Soft.  Musculoskeletal:       Thoracic back: He exhibits decreased range of motion and swelling. He exhibits no tenderness, no bony tenderness and no laceration.  Neurological: He is alert and oriented to person, place, and time.  Skin: Skin is warm. Capillary refill takes less than 2 seconds.  Psychiatric: He has a normal mood and affect.  Nursing note and vitals reviewed.    ED Treatments / Results  Labs (all labs ordered are listed, but only abnormal results are displayed) Labs Reviewed - No data to display  EKG  EKG Interpretation None       Radiology Dg Thoracic Spine 2 View  Result Date: 12/10/2015 CLINICAL  DATA:  Patient with mid back pain for multiple days. No trauma. EXAM: THORACIC SPINE 2 VIEWS COMPARISON:  Chest radiograph 08/25/2014. FINDINGS: Mild anterior height loss of mid thoracic spine vertebral bodies, grossly unchanged from prior chest radiographs. Mid thoracic spine degenerative disc disease. No evidence for acute injury. Visualized lungs are unremarkable. IMPRESSION: Mid thoracic spine degenerative disc disease. Chronic mild anterior height loss of multiple mid thoracic spine vertebral bodies. Electronically Signed   By: Lovey Newcomer M.D.   On: 12/10/2015 12:29    Procedures Procedures (including critical care time)  Medications Ordered in ED Medications  diazepam (VALIUM) tablet 2 mg (2 mg Oral Given 12/10/15 1157)     Initial Impression / Assessment and Plan / ED Course  I have reviewed the triage vital signs and the nursing notes.  Pertinent labs & imaging results that were available during my care of the patient were reviewed by me and considered in my medical decision making (see chart for details).  Clinical Course   [name] is a 75 y.o. male with history of osteoarthritis and PE presents with 2 day history of upper back pain. He states that pain radiates laterally causing associated chest tightness but denies chest pain and shortness of breath. Patient stated that he used Tylenol which minimally helped him. Patient also states that changing positions make it worse but can walk without back pain and lying down makes it better. Patient states has a cane at home and chooses not to use it. Patient denies chest pain, shortness of breath, nausea, vomiting, diaphoresis, weakness, numbness, abdominal pain, and urinary symptoms. Patient does not show any clinical signs and symptoms of DVT, not tachypneic, no tachycardia, and maintains an active lifestyle. Therefore low suspicion of PE. Patient recently had echo for thoracic aortic aneurysm 2 days ago with normal values. Therefore low suspicion of thoracic aortic aneurysm with or without rupture. T-spine x-ray shows significant osteoarthritic changes. Patient felt better after given Valium.  I think patient has symptoms associated with osteoarthritic changes causing muscle spasms. Consulted with Dr. Audie Pinto who agrees with assessment. Sent home with Valium and referred to primary care physician. Patient agreed with assessment and understood discharge instructions. Strict return precautions given to patient and family of patient.       Final Clinical Impressions(s) / ED Diagnoses   Final diagnoses:  Muscle spasm of back    New Prescriptions Discharge Medication List as of 12/10/2015  1:31 PM    START taking these medications   Details  diazepam (VALIUM) 5 MG tablet Take 1 tablet (5 mg total) by mouth every 6 (six) hours as needed for muscle spasms., Starting Sun 12/10/2015, Until Sat 12/16/2015, [place], [place] 12/10/15 2053    Leonard Schwartz, MD 12/27/15 [phone]

## 2015-12-10 NOTE — ED Notes (Signed)
Patient transported to X-ray 

## 2015-12-15 ENCOUNTER — Ambulatory Visit (INDEPENDENT_AMBULATORY_CARE_PROVIDER_SITE_OTHER): Payer: Commercial Managed Care - HMO | Admitting: Internal Medicine

## 2015-12-15 VITALS — BP 140/71 | HR 56 | Temp 97.8°F | Wt 199.9 lb

## 2015-12-15 DIAGNOSIS — G4762 Sleep related leg cramps: Secondary | ICD-10-CM | POA: Diagnosis not present

## 2015-12-15 DIAGNOSIS — M62830 Muscle spasm of back: Secondary | ICD-10-CM

## 2015-12-15 NOTE — Assessment & Plan Note (Signed)
Patient presents for ED follow up. Patient states that one week ago he developed tightness in his upper back radiating to the front, worse with movement. He denies any new activity or injury, but does state he is very active in taking care of his boisterous goat and many dogs. He states his goat got tangled up last week and it was difficult to get him out. Patient has a history of nocturnal leg cramps that has improved with Vitamin B6 complex and copper socks. He states the ED prescribed him diazepam for muscle spasms which have now resolved. He denies any issues currently. He does have occasional arthritic pain for which he takes Aleve 220 mg BID prn. He denies chest pain or shortness of breath. Physical exam is unremarkable.  Assessment: Resolved muscle spasm of back  Plan: -Continue vitamin B6 complex -Continue Naproxen 220 mg BID prn -Call clinic or return to clinic if muscle spasms occur again

## 2015-12-15 NOTE — Progress Notes (Signed)
    CC: follow up for muscle spasm  HPI: [name] is a 75 y.o. male with PMHx of HTN and nocturnal leg cramps who presents to the clinic for follow up from the ED for back spasm.   Patient presents for ED follow up. Patient states that one week ago he developed tightness in his upper back radiating to the front, worse with movement. He denies any new activity or injury, but does state he is very active in taking care of his boisterous goat and many dogs. He states his goat got tangled up last week and it was difficult to get him out. Patient has a history of nocturnal leg cramps that has improved with Vitamin B6 complex and copper socks. He states the ED prescribed him diazepam for muscle spasms which have now resolved. He denies any issues currently. He does have occasional arthritic pain for which he takes Aleve 220 mg BID prn. He denies chest pain or shortness of breath.  Past Medical History:  Diagnosis Date  . Aortic root dilatation ([place]) 2012   Seen on Echo in 2012.  21mm  . Asthma, chronic 07/13/2013   PFT's 05/2012 : FEV1 / FVC 75 and 78 pre and post BD. FEV1 74 and 75 pre and post BD. TLC 72. RV 93. DLCO 79. Read as restrictive lung disease, possible with obstructive lung disease. Prior smoker.    Marland Kitchen BPH (benign prostatic hyperplasia)   . Colitis    3-4 ulcers on colonoscopy in 12/05  . Depression   . Diverticulitis    colonoscopy in 12/05  . Gastritis   . History of hypercholesterolemia   . HTN (hypertension)   . Hypercholesterolemia   . Osteoarthritis    Primarily left knee, acute right knee swelling in 9/07  . Other chest pain    Adenosine myoview in 2004 showing EF 52% with no evidence for ischemia.  There was a basal inferior fixed defect that my have been artifact.  . PE (pulmonary thromboembolism) ([place]) 09/13/2006   In 2008. On Warfarin until 01/2010 when he was admitted for syncope and noticed that he had completed course of warfarin and it was stopped.   .  Syncope    Suspect neurocardiogenic.  Always with standing or micturation.  Had bradycardic/hypotensive response with standing when hospitalized in 1/12.  On Florinef.  Echo (1/12): EF 60-65%, mild aortic root dilation.   . Wrist disorder    Proximal carpo-scapholunate dissosiation -seen by Dr. Derryl Harbor    Review of Systems: Please see pertinent ROS reviewed in HPI and problem based charting.   Physical Exam: Vitals:   12/15/15 0925  BP: 140/71  Pulse: (!) 56  Temp: 97.8 F (36.6 C)  TempSrc: Oral  SpO2: 100%  Weight: 199 lb 14.4 oz (90.7 kg)   General: Vital signs reviewed.  Patient is well-developed and well-nourished, in no acute distress and cooperative with exam.  Cardiovascular: RRR, S1 normal, S2 normal, no murmurs, gallops, or rubs. Pulmonary/Chest: Clear to auscultation bilaterally, no wheezes, rales, or rhonchi. Abdominal: Soft, non-tender, non-distended, BS + Musculoskeletal: No joint deformities, erythema, or stiffness, ROM nontender, no tenderpoints along back. Extremities: No lower extremity edema bilaterally Skin: Warm, dry and intact. No rashes or erythema. Psychiatric: Normal mood and affect. speech and behavior is normal. Cognition and memory are normal.   Assessment & Plan:  See encounters tab for problem based medical decision making. Patient discussed with Dr. Daryll Drown

## 2015-12-15 NOTE — Patient Instructions (Signed)
Mr. Derone, Moustafa were treated for muscle spasms. This can be caused by new or difficult activity or exercise. I'm glad they have resolved. Please let us know if they occur again in the future.  You can take Naproxen (Aleve) 220 mg (1 pill) every 12 hours as needed for your arthritis pain. If pain is particularly severe, you can take 2 pills every 12 hours as needed for pain.   Please follow up with Dr. Lynnae January in February 2018.

## 2015-12-22 NOTE — Progress Notes (Signed)
Internal Medicine Clinic Attending  Case discussed with Dr. Burns soon after the resident saw the patient.  We reviewed the resident's history and exam and pertinent patient test results.  I agree with the assessment, diagnosis, and plan of care documented in the resident's note. 

## 2016-02-02 DIAGNOSIS — H401111 Primary open-angle glaucoma, right eye, mild stage: Secondary | ICD-10-CM | POA: Diagnosis not present

## 2016-02-02 DIAGNOSIS — H401122 Primary open-angle glaucoma, left eye, moderate stage: Secondary | ICD-10-CM | POA: Diagnosis not present

## 2016-02-02 DIAGNOSIS — H2513 Age-related nuclear cataract, bilateral: Secondary | ICD-10-CM | POA: Diagnosis not present

## 2016-02-10 DIAGNOSIS — Z0100 Encounter for examination of eyes and vision without abnormal findings: Secondary | ICD-10-CM | POA: Diagnosis not present

## 2016-03-14 ENCOUNTER — Ambulatory Visit (INDEPENDENT_AMBULATORY_CARE_PROVIDER_SITE_OTHER): Payer: Medicare HMO | Admitting: Internal Medicine

## 2016-03-14 ENCOUNTER — Encounter: Payer: Self-pay | Admitting: Internal Medicine

## 2016-03-14 DIAGNOSIS — E538 Deficiency of other specified B group vitamins: Secondary | ICD-10-CM

## 2016-03-14 DIAGNOSIS — Z87891 Personal history of nicotine dependence: Secondary | ICD-10-CM | POA: Diagnosis not present

## 2016-03-14 DIAGNOSIS — Z79899 Other long term (current) drug therapy: Secondary | ICD-10-CM | POA: Diagnosis not present

## 2016-03-14 DIAGNOSIS — I10 Essential (primary) hypertension: Secondary | ICD-10-CM | POA: Diagnosis not present

## 2016-03-14 DIAGNOSIS — I712 Thoracic aortic aneurysm, without rupture, unspecified: Secondary | ICD-10-CM

## 2016-03-14 DIAGNOSIS — J45909 Unspecified asthma, uncomplicated: Secondary | ICD-10-CM

## 2016-03-14 DIAGNOSIS — J4520 Mild intermittent asthma, uncomplicated: Secondary | ICD-10-CM

## 2016-03-14 DIAGNOSIS — M1A9XX0 Chronic gout, unspecified, without tophus (tophi): Secondary | ICD-10-CM | POA: Diagnosis not present

## 2016-03-14 DIAGNOSIS — M1A3710 Chronic gout due to renal impairment, right ankle and foot, without tophus (tophi): Secondary | ICD-10-CM

## 2016-03-14 MED ORDER — CYANOCOBALAMIN 1000 MCG PO TABS: 1000.0000 ug | ORAL_TABLET | Freq: Every day | ORAL | 3 refills | Status: DC

## 2016-03-14 MED ORDER — LISINOPRIL-HYDROCHLOROTHIAZIDE 20-12.5 MG PO TABS: 1.0000 | ORAL_TABLET | Freq: Every day | ORAL | 3 refills | Status: DC

## 2016-03-14 NOTE — Assessment & Plan Note (Signed)
This problem is chronic and stable. He takes oral B12 daily. His last B12 level was 363 and this is after B12 supplementation was started. I see no reason to repeat a level since he is taking supplementation daily.  PLAN:  Cont current meds

## 2016-03-14 NOTE — Patient Instructions (Signed)
1. See me in 6-12 months 2. Take good care of your goat. I would love to see a picture

## 2016-03-14 NOTE — Progress Notes (Signed)
   Subjective:    Patient ID: [name], male    DOB: 1940-11-25, 76 y.o.   MRN: [id number]  HPI  [name] is here for HTN F/U. Please see the A&P for the status of the pt's chronic medical problems.  ROS : per ROS section and in problem oriented charting. All other systems are negative.  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 One beagle recently had puppies so he has 7 dogs in his house. He says the cost of dog food is getting too expensive. His daughter came with him today. He would like his daughter and her daughter to make medical decisions if he is unable to. He states they have discussed various scenarios and they know he would like.  Review of Systems  Constitutional: Negative for appetite change.  HENT: Positive for hearing loss. Negative for rhinorrhea and sneezing.   Eyes: Negative for visual disturbance.  Cardiovascular: Negative for chest pain.  Gastrointestinal: Negative for abdominal pain, constipation and diarrhea.  Musculoskeletal: Positive for gait problem.  Neurological: Positive for dizziness and light-headedness.       Objective:   Physical Exam  Constitutional: He appears well-developed and well-nourished. No distress.  HENT:  Head: Normocephalic and atraumatic.  Right Ear: External ear normal.  Left Ear: External ear normal.  Nose: Nose normal.  Eyes: Conjunctivae and EOM are normal. Right eye exhibits no discharge. Left eye exhibits no discharge. No scleral icterus.  Neck: Normal range of motion. Neck supple. No thyromegaly present.  Cardiovascular: Normal rate, regular rhythm and normal heart sounds.   No murmur heard. Pulmonary/Chest: Effort normal and breath sounds normal. No respiratory distress.  Musculoskeletal: Normal range of motion. He exhibits no edema.  Lymphadenopathy:    He has no cervical adenopathy.  Skin: Skin is warm and dry. He is not diaphoretic.  Psychiatric: He has a normal  mood and affect. His behavior is normal. Judgment and thought content normal.          Assessment & Plan:

## 2016-03-14 NOTE — Assessment & Plan Note (Signed)
This problem is chronic and improved. His most recent echo did not show any ascending aortic aneurysm. He understands the results and that he does not need any further workup for this issue.

## 2016-03-14 NOTE — Assessment & Plan Note (Signed)
This problem is chronic and stable. He has an albuterol inhaler that he uses as needed and requires it 2 times per week. His lungs are clear today.  PLAN:  Cont current meds

## 2016-03-14 NOTE — Assessment & Plan Note (Signed)
This problem is chronic and stable. He takes hydrochlorothiazide 12.5 and lisinopril 20. He has no side effects. He requests combining the 2 medications.  PLAN:  Cont current meds   BP Readings from Last 3 Encounters:  03/14/16 135/75  12/15/15 140/71  12/10/15 101/63

## 2016-03-14 NOTE — Assessment & Plan Note (Signed)
This problem is chronic and stable. It is intermittent that he has had no recent flares of his gout.

## 2016-08-20 ENCOUNTER — Emergency Department (HOSPITAL_COMMUNITY)
Admission: EM | Admit: 2016-08-20 | Discharge: 2016-08-20 | Disposition: A | Discharge status: Home / Self Care | Payer: Medicare HMO | Attending: Emergency Medicine | Admitting: Emergency Medicine

## 2016-08-20 ENCOUNTER — Emergency Department (HOSPITAL_COMMUNITY): Payer: Medicare HMO

## 2016-08-20 ENCOUNTER — Encounter (HOSPITAL_COMMUNITY): Payer: Self-pay | Admitting: Emergency Medicine

## 2016-08-20 DIAGNOSIS — Z7982 Long term (current) use of aspirin: Secondary | ICD-10-CM | POA: Diagnosis not present

## 2016-08-20 DIAGNOSIS — M25562 Pain in left knee: Secondary | ICD-10-CM | POA: Diagnosis not present

## 2016-08-20 DIAGNOSIS — J45998 Other asthma: Secondary | ICD-10-CM | POA: Diagnosis not present

## 2016-08-20 DIAGNOSIS — G8929 Other chronic pain: Secondary | ICD-10-CM | POA: Diagnosis not present

## 2016-08-20 DIAGNOSIS — I10 Essential (primary) hypertension: Secondary | ICD-10-CM | POA: Diagnosis not present

## 2016-08-20 DIAGNOSIS — Z87891 Personal history of nicotine dependence: Secondary | ICD-10-CM | POA: Diagnosis not present

## 2016-08-20 DIAGNOSIS — M179 Osteoarthritis of knee, unspecified: Secondary | ICD-10-CM | POA: Diagnosis not present

## 2016-08-20 MED ORDER — DICLOFENAC SODIUM 1 % TD GEL
4.0000 g | Freq: Once | TRANSDERMAL | Status: AC
  Administered 2016-08-20: 4 g via TOPICAL
  Filled 2016-08-20: qty 100

## 2016-08-20 MED ORDER — DICLOFENAC SODIUM 1 % TD GEL: 4.0000 g | Freq: Four times a day (QID) | TRANSDERMAL | 0 refills | Status: DC | PRN

## 2016-08-20 NOTE — Discharge Instructions (Addendum)
You have been seen today for knee pain. There were no acute abnormalities on the x-rays, including no sign of fracture or dislocation. Pain: Take 400 mg of ibuprofen every 6 hours for the next 3 days. May also use naproxen if you prefer. After this time, these medications may be used as needed for pain. Take these medications with food to avoid upset stomach. Choose only one of these medications, do not take them together.  Diclofenac gel: Alternatively, you may apply the diclofenac gel up to 4 times a day as needed. Tylenol: Should you continue to have additional pain while taking the ibuprofen or naproxen, you may add in tylenol as needed. Your daily total maximum amount of tylenol from all sources should be limited to 4000mg /day for persons without liver problems, or 2000mg /day for those with liver problems. Ice: May apply ice to the area over the next 24 hours for 15 minutes at a time to reduce swelling. Elevation: Keep the extremity elevated as often as possible to reduce pain and inflammation. Support: Wear the knee sleeve for support and comfort. Wear this until pain resolves. You will be weight-bearing as tolerated, which means you can slowly start to put weight on the extremity and increase amount and frequency as pain allows. Use your cane or walker in the mean time. Exercises: Start by performing these exercises a few times a week, increasing the frequency until you are performing them twice daily.  Follow up: Follow up with your primary care provider for any continued management.

## 2016-08-20 NOTE — ED Triage Notes (Signed)
Pt. Stated, I started having knee swelling on Friday and it went down and came back. I can't hardly walk on it.

## 2016-08-20 NOTE — ED Provider Notes (Signed)
[facility] DEPT [facility] Provider Note   CSN: [id number] Arrival date & time: 08/20/16  1020     History   Chief Complaint Chief Complaint  Patient presents with  . Knee Pain    HPI [name] is a 76 y.o. male.  HPI   [name] is a 76 y.o. male, with a history of Osteoarthritis, HTN, and PE, presenting to the ED with acute on chronic left knee pain and swelling recurring 4 days ago. Pain is 8/10, described as a soreness, nonradiating. Improves with elevation of the extremity. Has had previous injections and arthrocentesis of this knee performed by [facility]. Has taken Tylenol and ibuprofen for this issue. Denies fever/chills, nausea/vomiting, neurologic deficits, recent injuries, or any other complaints.    Past Medical History:  Diagnosis Date  . Aortic root dilatation ([place]) 2012   Seen on Echo in 2012.  53mm  . Asthma, chronic 07/13/2013   PFT's 05/2012 : FEV1 / FVC 75 and 78 pre and post BD. FEV1 74 and 75 pre and post BD. TLC 72. RV 93. DLCO 79. Read as restrictive lung disease, possible with obstructive lung disease. Prior smoker.    Marland Kitchen BPH (benign prostatic hyperplasia)   . Colitis    3-4 ulcers on colonoscopy in 12/05  . Depression   . Diverticulitis    colonoscopy in 12/05  . Gastritis   . History of hypercholesterolemia   . HTN (hypertension)   . Hypercholesterolemia   . Osteoarthritis    Primarily left knee, acute right knee swelling in 9/07  . Other chest pain    Adenosine myoview in 2004 showing EF 52% with no evidence for ischemia.  There was a basal inferior fixed defect that my have been artifact.  . PE (pulmonary thromboembolism) ([place]) 09/13/2006   In 2008. On Warfarin until 01/2010 when he was admitted for syncope and noticed that he had completed course of warfarin and it was stopped.   . Syncope    Suspect neurocardiogenic.  Always with standing or micturation.  Had bradycardic/hypotensive response with standing when hospitalized in 1/12.   On Florinef.  Echo (1/12): EF 60-65%, mild aortic root dilation.   . Wrist disorder    Proximal carpo-scapholunate dissosiation -seen by Dr. Derryl Harbor    Patient Active Problem List   Diagnosis Date Noted  . Muscle spasm of back 12/15/2015  . Gout, chronic, without tophus 10/12/2015  . Counseling regarding end of life decision making 04/13/2015  . OA (osteoarthritis) of knee 11/05/2013  . BPH (benign prostatic hyperplasia) 08/04/2013  . Asthma, chronic 07/13/2013  . Healthcare maintenance 11/26/2012  . Glaucoma 06/12/2012  . Hematuria 04/10/2012  . Thoracic aortic aneurysm ([place]) 01/10/2012  . Herpes infection 09/14/2010  . Vitamin B 12 deficiency 02/21/2010  . History of pulmonary embolism 09/13/2006  . Essential hypertension 12/12/2005    Past Surgical History:  Procedure Laterality Date  . I&D EXTREMITY Right 03/04/2014   Procedure: IRRIGATION AND DEBRIDEMENT FINGER / HAND;  Surgeon: Dayna Barker, MD;  Location: [facility];  Service: Plastics;  Laterality: Right;  I&D Right Small Finger and Right Wrist  . TRANSURETHRAL RESECTION OF PROSTATE  3/05   [place] Medications    Prior to Admission medications   Medication Sig Start Date End Date Taking? Authorizing Provider  acetaminophen (TYLENOL) 500 MG tablet Take 500 mg by mouth every 6 (six) hours as needed for mild pain or moderate pain.    [provider]  albuterol (PROVENTIL HFA;VENTOLIN HFA) 108 (90 Base) MCG/ACT inhaler Inhale 2 puffs into the lungs every 6 (six) hours as needed for wheezing or shortness of breath. Patient not taking: Reported on 10/12/2015 10/12/15   Bartholomew Crews, MD  aspirin EC 81 MG tablet Take 81 mg by mouth daily.     [provider]  cyanocobalamin ([employer] VITAMIN B12) 1000 MCG tablet Take 1 tablet (1,000 mcg total) by mouth daily. 03/14/16   Bartholomew Crews, MD  latanoprost (XALATAN) 0.005 % ophthalmic solution Place 1 drop into both eyes at bedtime.    Juluis Rainier  lisinopril-hydrochlorothiazide (PRINZIDE,ZESTORETIC) 20-12.5 MG tablet Take 1 tablet by mouth daily. 03/14/16   Bartholomew Crews, MD    Family History Family History  Problem Relation Age of Onset  . Heart disease Father   . Stroke Father   . Heart disease Brother 42    Social History Social History  Substance Use Topics  . Smoking status: Former Research scientist (life sciences)  . Smokeless tobacco: Never Used  . Alcohol use No     Allergies   Patient has no known allergies.   Review of Systems Review of Systems  Musculoskeletal: Positive for arthralgias and joint swelling.  Neurological: Negative for weakness and numbness.     Physical Exam Updated Vital Signs BP (!) 119/94   Pulse 71   Temp 98.3 F (36.8 C) (Oral)   Resp 18   Ht 5\' 11"  (1.803 m)   Wt 86.2 kg (190 lb)   SpO2 99%   BMI 26.50 kg/m   Physical Exam  Constitutional: He appears well-developed and well-nourished. No distress.  HENT:  Head: Normocephalic and atraumatic.  Eyes: Conjunctivae are normal.  Neck: Neck supple.  Cardiovascular: Normal rate, regular rhythm and intact distal pulses.   Pulmonary/Chest: Effort normal.  Musculoskeletal: He exhibits edema and tenderness. He exhibits no deformity.  Mild tenderness to the anterior left knee. Minor swelling consistent with possible minor effusion. Pain with range of motion, but patient allows passive range of motion without protest. No increased warmth or erythema. No crepitus, deformity, or instability.  Neurological: He is alert.  Skin: Skin is warm and dry. Capillary refill takes less than 2 seconds. He is not diaphoretic.  Psychiatric: He has a normal mood and affect. His behavior is normal.  Nursing note and vitals reviewed.    ED Treatments / Results  Labs (all labs ordered are listed, but only abnormal results are displayed) Labs Reviewed - No data to display  EKG  EKG Interpretation None        Dg Knee Complete 4 Views Left  Result Date:  08/20/2016 CLINICAL DATA:  Left knee pain and swelling.  Cannot walk. EXAM: LEFT KNEE - COMPLETE 4+ VIEW COMPARISON:  None. FINDINGS: No acute fracture or dislocation. Generalized osteopenia. Mild tricompartmental osteoarthritis of the left knee. Moderate joint effusion. IMPRESSION: 1.  No acute osseous injury of the left knee. 2. Mild tricompartmental osteoarthritis of the left knee. Electronically Signed   By: Kathreen Devoid   On: 08/20/2016 13:26   Procedures Procedures (including critical care time)  Medications Ordered in ED Medications  diclofenac sodium (VOLTAREN) 1 % transdermal gel 4 g (4 g Topical Given 08/20/16 1404)     Initial Impression / Assessment and Plan / ED Course  I have reviewed the triage vital signs and the nursing notes.  Pertinent labs & imaging results that were available during my care of the patient were  reviewed by me and considered in my medical decision making (see chart for details).     Patient presents with acute on chronic left knee pain. Patient uses a cane at baseline and is ambulatory using this method. States he also has a walker available at home and he was encouraged to use this instead. No acute abnormality noted on x-ray. PCP follow-up. The patient was given instructions for home care as well as return precautions. Patient voices understanding of these instructions, accepts the plan, and is comfortable with discharge.  Findings and plan of care discussed with Jola Schmidt, MD. Dr. Venora Maples personally evaluated and examined this patient.  Final Clinical Impressions(s) / ED Diagnoses   Final diagnoses:  Chronic pain of left knee    New Prescriptions Discharge Medication List as of 08/20/2016  2:30 PM       Lorayne Bender, PA-C 08/22/16 0052    Jola Schmidt, MD 08/22/16 2329

## 2016-09-02 DIAGNOSIS — H401111 Primary open-angle glaucoma, right eye, mild stage: Secondary | ICD-10-CM | POA: Diagnosis not present

## 2016-09-02 DIAGNOSIS — H401122 Primary open-angle glaucoma, left eye, moderate stage: Secondary | ICD-10-CM | POA: Diagnosis not present

## 2016-12-05 ENCOUNTER — Ambulatory Visit (INDEPENDENT_AMBULATORY_CARE_PROVIDER_SITE_OTHER): Payer: Medicare HMO | Admitting: *Deleted

## 2016-12-05 DIAGNOSIS — Z23 Encounter for immunization: Secondary | ICD-10-CM

## 2017-01-15 ENCOUNTER — Encounter: Payer: Self-pay | Admitting: *Deleted

## 2017-02-06 ENCOUNTER — Ambulatory Visit: Payer: Medicare HMO | Admitting: Internal Medicine

## 2017-03-06 ENCOUNTER — Ambulatory Visit: Payer: Medicare HMO | Admitting: Internal Medicine

## 2017-04-03 ENCOUNTER — Other Ambulatory Visit: Payer: Self-pay

## 2017-04-03 ENCOUNTER — Encounter: Payer: Self-pay | Admitting: Internal Medicine

## 2017-04-03 ENCOUNTER — Ambulatory Visit (INDEPENDENT_AMBULATORY_CARE_PROVIDER_SITE_OTHER): Payer: Medicare HMO | Admitting: Internal Medicine

## 2017-04-03 VITALS — BP 118/61 | HR 64 | Temp 98.0°F | Ht 71.0 in | Wt 199.9 lb

## 2017-04-03 DIAGNOSIS — J4520 Mild intermittent asthma, uncomplicated: Secondary | ICD-10-CM

## 2017-04-03 DIAGNOSIS — I10 Essential (primary) hypertension: Secondary | ICD-10-CM | POA: Diagnosis not present

## 2017-04-03 DIAGNOSIS — M1A3710 Chronic gout due to renal impairment, right ankle and foot, without tophus (tophi): Secondary | ICD-10-CM

## 2017-04-03 DIAGNOSIS — Z79899 Other long term (current) drug therapy: Secondary | ICD-10-CM | POA: Diagnosis not present

## 2017-04-03 DIAGNOSIS — E538 Deficiency of other specified B group vitamins: Secondary | ICD-10-CM | POA: Diagnosis not present

## 2017-04-03 DIAGNOSIS — M1A9XX0 Chronic gout, unspecified, without tophus (tophi): Secondary | ICD-10-CM | POA: Diagnosis not present

## 2017-04-03 DIAGNOSIS — M1712 Unilateral primary osteoarthritis, left knee: Secondary | ICD-10-CM

## 2017-04-03 DIAGNOSIS — R319 Hematuria, unspecified: Secondary | ICD-10-CM

## 2017-04-03 DIAGNOSIS — J45909 Unspecified asthma, uncomplicated: Secondary | ICD-10-CM | POA: Diagnosis not present

## 2017-04-03 DIAGNOSIS — N400 Enlarged prostate without lower urinary tract symptoms: Secondary | ICD-10-CM

## 2017-04-03 DIAGNOSIS — Z87448 Personal history of other diseases of urinary system: Secondary | ICD-10-CM | POA: Diagnosis not present

## 2017-04-03 DIAGNOSIS — M170 Bilateral primary osteoarthritis of knee: Secondary | ICD-10-CM

## 2017-04-03 MED ORDER — LISINOPRIL-HYDROCHLOROTHIAZIDE 20-12.5 MG PO TABS: 1.0000 | ORAL_TABLET | Freq: Every day | ORAL | 3 refills | Status: DC

## 2017-04-03 MED ORDER — CYANOCOBALAMIN 1000 MCG PO TABS: 1000.0000 ug | ORAL_TABLET | Freq: Every day | ORAL | 3 refills | Status: DC

## 2017-04-03 NOTE — Patient Instructions (Addendum)
1. I refilled your medicines for one year 2. I gave you a sample of albuterol until I can get it approved through your insurance company. Take it only when needed 3. I will mail you your test results

## 2017-04-03 NOTE — Progress Notes (Signed)
   Subjective:    Patient ID: [name], male    DOB: 1940-11-16, 77 y.o.   MRN: [id number]  HPI  [name] is here for HTN F/U. Please see the A&P for the status of the pt's chronic medical problems.  ROS : per ROS section and in problem oriented charting. All other systems are negative.  PMHx, Soc hx, and / or Fam hx : Forgot picture of Nanny his goat. Has 6 beagles but needs to give them away bc dog food too $$ but he said that last year and still has the dogs. Does his own cooking. Has sister and brother who live within walking distance and another sister 3 minutes away.   Review of Systems  Eyes: Negative for visual disturbance.  Respiratory:       No DOE  Cardiovascular: Negative for chest pain and leg swelling.  Gastrointestinal: Negative for abdominal pain, constipation and diarrhea.  Musculoskeletal: Negative for arthralgias.  Psychiatric/Behavioral: Negative for sleep disturbance.       Objective:   Physical Exam  Constitutional: He appears well-developed and well-nourished. No distress.  HENT:  Head: Normocephalic and atraumatic.  Right Ear: External ear normal.  Left Ear: External ear normal.  Nose: Nose normal.  Eyes: Conjunctivae and EOM are normal. Right eye exhibits no discharge. Left eye exhibits no discharge. No scleral icterus.  Cardiovascular: Normal rate, regular rhythm and normal heart sounds.  No murmur heard. Pulmonary/Chest: Effort normal. No respiratory distress. He has no wheezes.  Musculoskeletal: He exhibits no edema or tenderness.  Neurological:  Gait nl  Skin: Skin is warm and dry. He is not diaphoretic.  Psychiatric: He has a normal mood and affect. His behavior is normal. Judgment and thought content normal.      Assessment & Plan:

## 2017-04-04 ENCOUNTER — Encounter: Payer: Self-pay | Admitting: Internal Medicine

## 2017-04-04 LAB — BMP8+ANION GAP
Anion Gap: 14 mmol/L (ref 10.0–18.0)
BUN/Creatinine Ratio: 7 — ABNORMAL LOW (ref 10–24)
BUN: 9 mg/dL (ref 8–27)
CALCIUM: 9.5 mg/dL (ref 8.6–10.2)
CO2: 23 mmol/L (ref 20–29)
Chloride: 96 mmol/L (ref 96–106)
Creatinine, Ser: 1.27 mg/dL (ref 0.76–1.27)
GFR calc Af Amer: 63 mL/min/{1.73_m2} (ref >59)
GFR calc non Af Amer: 55 mL/min/{1.73_m2} — ABNORMAL LOW (ref >59)
GLUCOSE: 109 mg/dL — AB (ref 65–99)
POTASSIUM: 3.6 mmol/L (ref 3.5–5.2)
Sodium: 133 mmol/L — ABNORMAL LOW (ref 134–144)

## 2017-04-04 NOTE — Assessment & Plan Note (Signed)
This problem has resolved.  His last several UAs had not shown any red blood cells.  PLAN : follow

## 2017-04-04 NOTE — Assessment & Plan Note (Signed)
This problem is chronic and controlled.  He has no BPH symptoms and is not on any medications for BPH.  PLAN : follow

## 2017-04-04 NOTE — Assessment & Plan Note (Signed)
This problem is chronic and stable.  He takes B12 orally daily as an over-the-counter medication.  His most recent B12 level was 363 and 2017.  PLAN:  Cont current meds

## 2017-04-04 NOTE — Assessment & Plan Note (Signed)
This problem is stable and chronic.  He is on no medications for gout and denies any recent gout flares.  PLAN : follow

## 2017-04-04 NOTE — Assessment & Plan Note (Signed)
This problem is chronic and stable.  He is on combo pill of lisinopril 20 and HCTZ 12.5.  He has no side effects to this medication.  His last being that was 1 year ago and we are rechecking that today.  His blood pressure is at goal.  PLAN:  Cont current meds   BP Readings from Last 3 Encounters:  04/03/17 118/61  08/20/16 105/87  03/14/16 135/75

## 2017-04-04 NOTE — Assessment & Plan Note (Signed)
  This problem is chronic and stable.  He does have symptomsOA in the Left knee when he walks some distance.  He does not require any prescription medications to monitor this.  PLAN : follow

## 2017-04-04 NOTE — Assessment & Plan Note (Signed)
This problem is chronic and stable.  He stated when he most recently went to get his albuterol refilled, it was $90.  He has not been using it for quite some time.  He states he is able to walk to his brother's house and his sister's house that he does get left knee pain when walking that limits his distance.  He stated when he had the albuterol, he would use it only as needed and it would help.  He is interested in having another inhaler if it is not $90.  I am working with our clinical pharmacist to see if there is an albuterol inhaler on his formulary that he can afford.  I gave him a sample of albuterol to tighten over until he can get it through his insurance.  PLAN : alb MDI PRN

## 2017-04-07 ENCOUNTER — Other Ambulatory Visit: Payer: Self-pay | Admitting: Pharmacist

## 2017-04-07 DIAGNOSIS — R0602 Shortness of breath: Secondary | ICD-10-CM

## 2017-04-07 MED ORDER — ALBUTEROL SULFATE HFA 108 (90 BASE) MCG/ACT IN AERS: 2.0000 | INHALATION_SPRAY | Freq: Four times a day (QID) | RESPIRATORY_TRACT | 6 refills | Status: DC | PRN

## 2017-04-07 NOTE — Progress Notes (Signed)
Albuterol covered by patient's insurance as Ventolin for $8 copay.

## 2017-05-02 DIAGNOSIS — H401122 Primary open-angle glaucoma, left eye, moderate stage: Secondary | ICD-10-CM | POA: Diagnosis not present

## 2017-05-02 DIAGNOSIS — H401111 Primary open-angle glaucoma, right eye, mild stage: Secondary | ICD-10-CM | POA: Diagnosis not present

## 2017-09-07 ENCOUNTER — Encounter (HOSPITAL_COMMUNITY): Payer: Self-pay | Admitting: Emergency Medicine

## 2017-09-07 ENCOUNTER — Emergency Department (HOSPITAL_COMMUNITY)
Admission: EM | Admit: 2017-09-07 | Discharge: 2017-09-07 | Disposition: A | Discharge status: Home / Self Care | Payer: Medicare HMO | Attending: Emergency Medicine | Admitting: Emergency Medicine

## 2017-09-07 DIAGNOSIS — J45909 Unspecified asthma, uncomplicated: Secondary | ICD-10-CM | POA: Diagnosis not present

## 2017-09-07 DIAGNOSIS — Z87891 Personal history of nicotine dependence: Secondary | ICD-10-CM | POA: Insufficient documentation

## 2017-09-07 DIAGNOSIS — Z7982 Long term (current) use of aspirin: Secondary | ICD-10-CM | POA: Insufficient documentation

## 2017-09-07 DIAGNOSIS — I10 Essential (primary) hypertension: Secondary | ICD-10-CM | POA: Diagnosis not present

## 2017-09-07 DIAGNOSIS — M109 Gout, unspecified: Secondary | ICD-10-CM | POA: Insufficient documentation

## 2017-09-07 DIAGNOSIS — M79672 Pain in left foot: Secondary | ICD-10-CM | POA: Diagnosis present

## 2017-09-07 MED ORDER — COLCHICINE 0.6 MG PO TABS: 0.6000 mg | ORAL_TABLET | Freq: Two times a day (BID) | ORAL | 0 refills | Status: DC

## 2017-09-07 MED ORDER — PREDNISONE 20 MG PO TABS
40.0000 mg | ORAL_TABLET | Freq: Once | ORAL | Status: AC
  Administered 2017-09-07: 40 mg via ORAL
  Filled 2017-09-07: qty 2

## 2017-09-07 MED ORDER — COLCHICINE 0.6 MG PO TABS
1.2000 mg | ORAL_TABLET | Freq: Once | ORAL | Status: AC
  Administered 2017-09-07: 1.2 mg via ORAL
  Filled 2017-09-07: qty 2

## 2017-09-07 MED ORDER — PREDNISONE 20 MG PO TABS: 40.0000 mg | ORAL_TABLET | Freq: Every day | ORAL | 0 refills | Status: AC

## 2017-09-07 NOTE — ED Triage Notes (Signed)
Pt states hx of gout. Pt arrives for c.o. Hand pain to left hand, and left foot pain, with callous noted to left foot where pt states it is tender.

## 2017-09-07 NOTE — ED Provider Notes (Signed)
Medical screening examination/treatment/procedure(s) were conducted as a shared visit with non-physician practitioner(s) and myself.  I personally evaluated the patient during the encounter.  Clinical Impression:   Final diagnoses:  Podagra     Patient is a 77 year old male, presents with left-sided first metatarsal phalangeal joint pain and swelling with redness.  It is slightly warm to the touch, he does have a history of gout and this appears consistent with podagra.  Patient is otherwise well-appearing without any signs of septic arthritis.  Discussed with physician assistant who will treat with colchicine for 6 doses as well as 5 days of prednisone, patient agreeable to the plan.  Laboratory work-up reviewed from the past, shows creatinine 1 year ago was 1.2.   [name] Chapel, MD 09/07/17 [id number]

## 2017-09-07 NOTE — ED Provider Notes (Signed)
[facility] EMERGENCY DEPARTMENT Provider Note   CSN: [id number] Arrival date & time: 09/07/17  4696     History   Chief Complaint No chief complaint on file.   HPI [name] is a 77 y.o. male.  HPI  [name] is a 77yo male with a history of gout, hypertension, hyperlipidemia who presents to the emergency department for evaluation of left MTP joint pain.  Patient reports that he has a history of gout affecting this joint in the past.  States that his current episode of pain started 3 days ago.  He also has associated redness and swelling over the joint.  Pain is aching and 8/10 in severity.  Worsened with movement, palpation or with weightbearing.  He has tried taking Tylenol without relief.  He denies injury to the foot.  Denies fevers, chills, numbness, weakness, open wound, arthralgias elsewhere.  Has never been on allopurinol.   Past Medical History:  Diagnosis Date  . Aortic root dilatation ([place]) 2012   Seen on Echo in 2012.  59mm  . Asthma, chronic 07/13/2013   PFT's 05/2012 : FEV1 / FVC 75 and 78 pre and post BD. FEV1 74 and 75 pre and post BD. TLC 72. RV 93. DLCO 79. Read as restrictive lung disease, possible with obstructive lung disease. Prior smoker.    Marland Kitchen BPH (benign prostatic hyperplasia)   . Colitis    3-4 ulcers on colonoscopy in 12/05  . Depression   . Diverticulitis    colonoscopy in 12/05  . Gastritis   . History of hypercholesterolemia   . HTN (hypertension)   . Hypercholesterolemia   . Osteoarthritis    Primarily left knee, acute right knee swelling in 9/07  . Other chest pain    Adenosine myoview in 2004 showing EF 52% with no evidence for ischemia.  There was a basal inferior fixed defect that my have been artifact.  . PE (pulmonary thromboembolism) ([place]) 09/13/2006   In 2008. On Warfarin until 01/2010 when he was admitted for syncope and noticed that he had completed course of warfarin and it was stopped.   . Syncope    Suspect neurocardiogenic.  Always with standing or micturation.  Had bradycardic/hypotensive response with standing when hospitalized in 1/12.  On Florinef.  Echo (1/12): EF 60-65%, mild aortic root dilation.   . Wrist disorder    Proximal carpo-scapholunate dissosiation -seen by Dr. Derryl Harbor    Patient Active Problem List   Diagnosis Date Noted  . Muscle spasm of back 12/15/2015  . Gout, chronic, without tophus 10/12/2015  . Counseling regarding end of life decision making 04/13/2015  . OA (osteoarthritis) of knee 11/05/2013  . BPH (benign prostatic hyperplasia) 08/04/2013  . Asthma, chronic 07/13/2013  . Healthcare maintenance 11/26/2012  . Glaucoma 06/12/2012  . Hematuria 04/10/2012  . Thoracic aortic aneurysm ([place]) 01/10/2012  . Herpes infection 09/14/2010  . Vitamin B 12 deficiency 02/21/2010  . History of pulmonary embolism 09/13/2006  . Essential hypertension 12/12/2005    Past Surgical History:  Procedure Laterality Date  . I&D EXTREMITY Right 03/04/2014   Procedure: IRRIGATION AND DEBRIDEMENT FINGER / HAND;  Surgeon: Dayna Barker, MD;  Location: [facility];  Service: Plastics;  Laterality: Right;  I&D Right Small Finger and Right Wrist  . TRANSURETHRAL RESECTION OF PROSTATE  3/05   [place] Medications    Prior to Admission medications   Medication Sig Start Date End Date Taking? Authorizing  Provider  acetaminophen (TYLENOL) 500 MG tablet Take 500 mg by mouth every 6 (six) hours as needed for mild pain or moderate pain.    [provider]  albuterol (PROVENTIL HFA;VENTOLIN HFA) 108 (90 Base) MCG/ACT inhaler Inhale 2 puffs into the lungs every 6 (six) hours as needed for wheezing or shortness of breath. 04/07/17   Bartholomew Crews, MD  aspirin EC 81 MG tablet Take 81 mg by mouth daily.     [provider]  cyanocobalamin ([employer] VITAMIN B12) 1000 MCG tablet Take 1 tablet (1,000 mcg total) by mouth daily. 04/03/17   Bartholomew Crews, MD    latanoprost (XALATAN) 0.005 % ophthalmic solution Place 1 drop into both eyes at bedtime.    Juluis Rainier  lisinopril-hydrochlorothiazide (PRINZIDE,ZESTORETIC) 20-12.5 MG tablet Take 1 tablet by mouth daily. 04/03/17   Bartholomew Crews, MD  pyridOXINE (VITAMIN B-6) 100 MG tablet Take 100 mg by mouth daily.    [provider]    Family History Family History  Problem Relation Age of Onset  . Heart disease Father   . Stroke Father   . Heart disease Brother 24    Social History Social History   Tobacco Use  . Smoking status: Former Research scientist (life sciences)  . Smokeless tobacco: Never Used  Substance Use Topics  . Alcohol use: No  . Drug use: No     Allergies   Patient has no known allergies.   Review of Systems Review of Systems  Constitutional: Negative for chills and fever.  Musculoskeletal: Positive for arthralgias (left mtp joint) and joint swelling (left mtp joint). Negative for gait problem.  Skin: Positive for color change (erythema over left mtp). Negative for wound.  Neurological: Negative for weakness and numbness.     Physical Exam Updated Vital Signs BP 110/65 (BP Location: Left Arm)   Pulse 60   Temp 98.7 F (37.1 C) (Oral)   Resp 17   Ht 5\' 11"  (1.803 m)   Wt 90.3 kg   SpO2 100%   BMI 27.75 kg/m   Physical Exam  Constitutional: He appears well-developed and well-nourished. No distress.  Sitting at bedside in no apparent distress, nontoxic-appearing.  HENT:  Head: Normocephalic and atraumatic.  Eyes: Right eye exhibits no discharge. Left eye exhibits no discharge.  Pulmonary/Chest: Effort normal. No respiratory distress.  Musculoskeletal:  Left MTP joint erythematous, swollen and tender to the touch.  No ankle tenderness or swelling.  DP pulses 2+ and symmetric bilaterally.  Cap refill <2sec. Sensation to light touch intact in left great toe.   Neurological: He is alert. Coordination normal.  Skin: Skin is warm and dry. Capillary refill takes less  than 2 seconds. He is not diaphoretic.  Psychiatric: He has a normal mood and affect. His behavior is normal.  Nursing note and vitals reviewed.    ED Treatments / Results  Labs (all labs ordered are listed, but only abnormal results are displayed) Labs Reviewed - No data to display  EKG None  Radiology No results found.  Procedures Procedures (including critical care time)  Medications Ordered in ED Medications  predniSONE (DELTASONE) tablet 40 mg (has no administration in time range)  colchicine tablet 1.2 mg (has no administration in time range)     Initial Impression / Assessment and Plan / ED Course  I have reviewed the triage vital signs and the nursing notes.  Pertinent labs & imaging results that were available during my care of the patient were reviewed by  me and considered in my medical decision making (see chart for details).     Pt presents with podagra, has a history of similar. No fever or swelling beyond MTP joint and I do not suspect septic arthritis or infectious process given symptoms. Will hold off on imaging.  Per chart review he has a history of elevated creatinine in the past, will treat with prednisone burst and start colchicine.  I have counseled him to d/c colchicine if he has diarrhea.  He has been counseled to follow-up with his PCP for discussion about potentially starting allopurinol.  Patient agrees to this plan and appears reliable.  This was a shared visit with Dr. Sabra Heck who also saw the patient and agrees with plan and discharge home.  Final Clinical Impressions(s) / ED Diagnoses   Final diagnoses:  [place]    ED Discharge Orders         Ordered    colchicine 0.6 MG tablet  2 times daily     09/07/17 1117    predniSONE (DELTASONE) 20 MG tablet  Daily     09/07/17 1117           Bernarda Caffey 09/07/17 1117    Noemi Chapel, MD 09/07/17 1531

## 2017-09-07 NOTE — Discharge Instructions (Signed)
Please follow-up with your regular doctor to discuss allopurinol treatment which may prevent gout flares in the future.  Take prednisone 40 mg daily for the next 4 days, you were given your first dose in the ER today and do not need to take this until tomorrow.  Please take colchicine twice a day for the next 3 days, if you have diarrhea stopped taking this medicine.  Return to the ER if you have any new or concerning symptoms like fever or worsening redness or swelling on the foot.

## 2017-09-07 NOTE — ED Notes (Signed)
Pt verbalized discharge teaching wheeled out with family. E-Signature pad not working at this time.

## 2017-10-02 ENCOUNTER — Encounter: Payer: Self-pay | Admitting: Internal Medicine

## 2017-10-02 ENCOUNTER — Other Ambulatory Visit: Payer: Self-pay

## 2017-10-02 ENCOUNTER — Ambulatory Visit (INDEPENDENT_AMBULATORY_CARE_PROVIDER_SITE_OTHER): Payer: Medicare HMO | Admitting: Internal Medicine

## 2017-10-02 DIAGNOSIS — J45909 Unspecified asthma, uncomplicated: Secondary | ICD-10-CM | POA: Diagnosis not present

## 2017-10-02 DIAGNOSIS — M2011 Hallux valgus (acquired), right foot: Secondary | ICD-10-CM

## 2017-10-02 DIAGNOSIS — Z79899 Other long term (current) drug therapy: Secondary | ICD-10-CM | POA: Diagnosis not present

## 2017-10-02 DIAGNOSIS — M2012 Hallux valgus (acquired), left foot: Secondary | ICD-10-CM | POA: Diagnosis not present

## 2017-10-02 DIAGNOSIS — M109 Gout, unspecified: Secondary | ICD-10-CM | POA: Diagnosis not present

## 2017-10-02 DIAGNOSIS — Z23 Encounter for immunization: Secondary | ICD-10-CM | POA: Diagnosis not present

## 2017-10-02 DIAGNOSIS — I10 Essential (primary) hypertension: Secondary | ICD-10-CM | POA: Diagnosis not present

## 2017-10-02 MED ORDER — IBUPROFEN 200 MG PO TABS: 600.0000 mg | ORAL_TABLET | Freq: Every day | ORAL | 0 refills | Status: DC | PRN

## 2017-10-02 MED ORDER — COLCHICINE 0.6 MG PO TABS: 0.6000 mg | ORAL_TABLET | Freq: Two times a day (BID) | ORAL | 0 refills | Status: DC | PRN

## 2017-10-02 NOTE — Patient Instructions (Signed)
[name], It was our pleasure taking care of you in our clinic today.  You were seen because of pain on your left toe.  It does not seem to be effective. Likely to be a mild gout flare up as well as bunion pain. we prescribe you some pain medication for gout to take as needed and recommend you to follow-up with Dr. Anastasia Fiedler and also a foot doctor (Podiatric) for treating your bunion.  Please contact us if you have any question or concern.  Thanks, Dr. Linna Hoff

## 2017-10-02 NOTE — Progress Notes (Addendum)
CC: Left foot pain  HPI:  [name] is a 77 y.o. with hypertension, gout, asthma presented with mild to moderate left MTP joint pain and swelling.  Please see assessment and plan for more detail.  Past Medical History:  Diagnosis Date  . Aortic root dilatation ([place]) 2012   Seen on Echo in 2012.  65mm  . Asthma, chronic 07/13/2013   PFT's 05/2012 : FEV1 / FVC 75 and 78 pre and post BD. FEV1 74 and 75 pre and post BD. TLC 72. RV 93. DLCO 79. Read as restrictive lung disease, possible with obstructive lung disease. Prior smoker.    Marland Kitchen BPH (benign prostatic hyperplasia)   . Colitis    3-4 ulcers on colonoscopy in 12/05  . Depression   . Diverticulitis    colonoscopy in 12/05  . Gastritis   . History of hypercholesterolemia   . HTN (hypertension)   . Hypercholesterolemia   . Osteoarthritis    Primarily left knee, acute right knee swelling in 9/07  . Other chest pain    Adenosine myoview in 2004 showing EF 52% with no evidence for ischemia.  There was a basal inferior fixed defect that my have been artifact.  . PE (pulmonary thromboembolism) ([place]) 09/13/2006   In 2008. On Warfarin until 01/2010 when he was admitted for syncope and noticed that he had completed course of warfarin and it was stopped.   . Syncope    Suspect neurocardiogenic.  Always with standing or micturation.  Had bradycardic/hypotensive response with standing when hospitalized in 1/12.  On Florinef.  Echo (1/12): EF 60-65%, mild aortic root dilation.   . Wrist disorder    Proximal carpo-scapholunate dissosiation -seen by Dr. Derryl Harbor    FHx.: Heart disease in father Bunion in sister  Social Hx: No smoking, no drug use, no alcohol use  Review of Systems:  Review of Systems  Constitutional: Negative for chills, fever, malaise/fatigue and weight loss.  HENT: Negative for congestion.   Respiratory: Negative for cough, shortness of breath and wheezing.   Cardiovascular: Negative for chest pain and leg  swelling.  Gastrointestinal: Negative for abdominal pain and diarrhea.  Musculoskeletal: Positive for joint pain. Negative for back pain, myalgias and neck pain.  Skin: Negative for rash.  Neurological: Negative for dizziness, weakness and headaches.    Physical Exam:  Vitals:   10/02/17 0912  BP: 133/72  Pulse: 69  Temp: 98.8 F (37.1 C)  SpO2: 100%  Physical Exam  Constitutional: He is oriented to person, place, and time. He appears well-developed and well-nourished.  HENT:  Head: Normocephalic and atraumatic.  Neck: Normal range of motion.  Cardiovascular: Normal rate, regular rhythm, normal heart sounds and intact distal pulses.  No murmur heard. Pulmonary/Chest: Breath sounds normal. No respiratory distress. He has no wheezes. He has no rales.  Abdominal: Soft. Bowel sounds are normal. He exhibits no distension. There is no tenderness.  Musculoskeletal: Normal range of motion. He exhibits no edema or tenderness.       Right shoulder: He exhibits bony tenderness and swelling.       Right ankle: He exhibits swelling and deformity.       Feet:  Neurological: He is alert and oriented to person, place, and time. He exhibits normal muscle tone.  Skin: Skin is warm and dry. No rash noted. No erythema. No pallor.  Psychiatric: He has a normal mood and affect.    Assessment & Plan:   1- Left MTP joint pain:  he was seen in ED 3 weeks ago for severe pain in left MTP and was treated with colchicine and 4 days course of prednisone. (End date:15 days ago) This initially alleviated his pain however, he still has mild to moderate (5-9 /10) intermittent pain on left MTP, mostly when he has his shoes on.  Denies fever. No pain in other joints currently. On exam today: He is afebrile. Has hallux valgus on both feet, swelling and mild tenderness (mostly) on left bunion. MTP joint ROM is intact. Patient is not on any low uric acid diet. He is on HCTZ-lisinopril combination pill and is not  allopurinol at home. His current presentation likely to be mild gout flareup as well as pain due to underlying hallux valgus deformity. Considering new frequent flare ups, he may take benefit of starting gout prophylactic Tx, adjusting his anti hypertensive medication (HCTZ) as well as helping him for right diet.  -Colchicine 0.6 mg 2 times a day as needed for pain x 3 days. -Iboprophen 600 mg daily as needed x 3 days -Follow up with Dr. Lynnae January in a month and Podiatric as need for bunion pain  See Encounters Tab for problem based charting.  Patient seen with Dr. Evette Doffing

## 2017-10-02 NOTE — Progress Notes (Signed)
Internal Medicine Clinic Attending  I saw and evaluated the patient.  I personally confirmed the key portions of the history and exam documented by Dr. Myrtie Hawk and I reviewed pertinent patient test results.  The assessment, diagnosis, and plan were formulated together and I agree with the documentation in the resident's note.  Recovering well from recent flare of gout causing podagra. He doesn't want to have this kind of flare again, so I have asked him to return to clinic in 1-2 months to discuss prophylactic medications with Dr. Lynnae January.

## 2017-10-13 DIAGNOSIS — H3589 Other specified retinal disorders: Secondary | ICD-10-CM | POA: Diagnosis not present

## 2017-10-13 DIAGNOSIS — H401122 Primary open-angle glaucoma, left eye, moderate stage: Secondary | ICD-10-CM | POA: Diagnosis not present

## 2017-10-13 DIAGNOSIS — H35362 Drusen (degenerative) of macula, left eye: Secondary | ICD-10-CM | POA: Diagnosis not present

## 2017-10-13 DIAGNOSIS — H2513 Age-related nuclear cataract, bilateral: Secondary | ICD-10-CM | POA: Diagnosis not present

## 2017-10-13 DIAGNOSIS — H25013 Cortical age-related cataract, bilateral: Secondary | ICD-10-CM | POA: Diagnosis not present

## 2017-10-13 DIAGNOSIS — H401111 Primary open-angle glaucoma, right eye, mild stage: Secondary | ICD-10-CM | POA: Diagnosis not present

## 2018-01-19 ENCOUNTER — Emergency Department (HOSPITAL_COMMUNITY): Payer: Medicare HMO

## 2018-01-19 ENCOUNTER — Other Ambulatory Visit: Payer: Self-pay

## 2018-01-19 ENCOUNTER — Encounter (HOSPITAL_COMMUNITY): Payer: Self-pay | Admitting: Emergency Medicine

## 2018-01-19 ENCOUNTER — Observation Stay (HOSPITAL_COMMUNITY)
Admission: EM | Admit: 2018-01-19 | Discharge: 2018-01-20 | Disposition: A | Discharge status: Home / Self Care | Payer: Medicare HMO | Attending: Internal Medicine | Admitting: Internal Medicine

## 2018-01-19 DIAGNOSIS — M109 Gout, unspecified: Secondary | ICD-10-CM | POA: Insufficient documentation

## 2018-01-19 DIAGNOSIS — E785 Hyperlipidemia, unspecified: Secondary | ICD-10-CM | POA: Insufficient documentation

## 2018-01-19 DIAGNOSIS — N400 Enlarged prostate without lower urinary tract symptoms: Secondary | ICD-10-CM | POA: Insufficient documentation

## 2018-01-19 DIAGNOSIS — E7800 Pure hypercholesterolemia, unspecified: Secondary | ICD-10-CM | POA: Insufficient documentation

## 2018-01-19 DIAGNOSIS — Z86711 Personal history of pulmonary embolism: Secondary | ICD-10-CM | POA: Insufficient documentation

## 2018-01-19 DIAGNOSIS — I10 Essential (primary) hypertension: Secondary | ICD-10-CM | POA: Insufficient documentation

## 2018-01-19 DIAGNOSIS — Z87891 Personal history of nicotine dependence: Secondary | ICD-10-CM | POA: Diagnosis not present

## 2018-01-19 DIAGNOSIS — J45909 Unspecified asthma, uncomplicated: Secondary | ICD-10-CM | POA: Diagnosis not present

## 2018-01-19 DIAGNOSIS — R531 Weakness: Secondary | ICD-10-CM | POA: Insufficient documentation

## 2018-01-19 DIAGNOSIS — Z79899 Other long term (current) drug therapy: Secondary | ICD-10-CM | POA: Insufficient documentation

## 2018-01-19 DIAGNOSIS — Z7982 Long term (current) use of aspirin: Secondary | ICD-10-CM | POA: Diagnosis not present

## 2018-01-19 DIAGNOSIS — R9431 Abnormal electrocardiogram [ECG] [EKG]: Secondary | ICD-10-CM | POA: Diagnosis not present

## 2018-01-19 DIAGNOSIS — R071 Chest pain on breathing: Secondary | ICD-10-CM | POA: Diagnosis present

## 2018-01-19 DIAGNOSIS — A419 Sepsis, unspecified organism: Principal | ICD-10-CM | POA: Insufficient documentation

## 2018-01-19 DIAGNOSIS — R5383 Other fatigue: Secondary | ICD-10-CM | POA: Diagnosis not present

## 2018-01-19 DIAGNOSIS — R509 Fever, unspecified: Secondary | ICD-10-CM | POA: Diagnosis not present

## 2018-01-19 LAB — CBC WITH DIFFERENTIAL/PLATELET
ABS IMMATURE GRANULOCYTES: 0.1 10*3/uL — AB (ref 0.00–0.07)
BASOS PCT: 0 %
Basophils Absolute: 0.1 10*3/uL (ref 0.0–0.1)
Eosinophils Absolute: 0.1 10*3/uL (ref 0.0–0.5)
Eosinophils Relative: 1 %
HCT: 45 % (ref 39.0–52.0)
Hemoglobin: 14.6 g/dL (ref 13.0–17.0)
IMMATURE GRANULOCYTES: 1 %
Lymphocytes Relative: 24 %
Lymphs Abs: 2.8 10*3/uL (ref 0.7–4.0)
MCH: 27.2 pg (ref 26.0–34.0)
MCHC: 32.4 g/dL (ref 30.0–36.0)
MCV: 83.8 fL (ref 80.0–100.0)
MONOS PCT: 7 %
Monocytes Absolute: 0.8 10*3/uL (ref 0.1–1.0)
NEUTROS ABS: 7.7 10*3/uL (ref 1.7–7.7)
NEUTROS PCT: 67 %
PLATELETS: 250 10*3/uL (ref 150–400)
RBC: 5.37 MIL/uL (ref 4.22–5.81)
RDW: 14.6 % (ref 11.5–15.5)
WBC: 11.6 10*3/uL — ABNORMAL HIGH (ref 4.0–10.5)
nRBC: 0 % (ref 0.0–0.2)

## 2018-01-19 LAB — COMPREHENSIVE METABOLIC PANEL
ALBUMIN: 4.1 g/dL (ref 3.5–5.0)
ALT: 18 U/L (ref 0–44)
ANION GAP: 9 (ref 5–15)
AST: 31 U/L (ref 15–41)
Alkaline Phosphatase: 38 U/L (ref 38–126)
BILIRUBIN TOTAL: 1.1 mg/dL (ref 0.3–1.2)
BUN: 13 mg/dL (ref 8–23)
CO2: 26 mmol/L (ref 22–32)
Calcium: 9.1 mg/dL (ref 8.9–10.3)
Chloride: 100 mmol/L (ref 98–111)
Creatinine, Ser: 1.65 mg/dL — ABNORMAL HIGH (ref 0.61–1.24)
GFR calc Af Amer: 46 mL/min — ABNORMAL LOW (ref >60)
GFR calc non Af Amer: 40 mL/min — ABNORMAL LOW (ref >60)
GLUCOSE: 91 mg/dL (ref 70–99)
POTASSIUM: 4 mmol/L (ref 3.5–5.1)
SODIUM: 135 mmol/L (ref 135–145)
TOTAL PROTEIN: 7.4 g/dL (ref 6.5–8.1)

## 2018-01-19 LAB — URINALYSIS, ROUTINE W REFLEX MICROSCOPIC
Bilirubin Urine: NEGATIVE
Glucose, UA: NEGATIVE mg/dL
Hgb urine dipstick: NEGATIVE
Ketones, ur: NEGATIVE mg/dL
LEUKOCYTES UA: NEGATIVE
NITRITE: NEGATIVE
PROTEIN: NEGATIVE mg/dL
Specific Gravity, Urine: 1.017 (ref 1.005–1.030)
pH: 5 (ref 5.0–8.0)

## 2018-01-19 LAB — TROPONIN I: Troponin I: <0.03 ng/mL (ref <0.03)

## 2018-01-19 LAB — INFLUENZA PANEL BY PCR (TYPE A & B)
INFLAPCR: NEGATIVE
Influenza B By PCR: NEGATIVE

## 2018-01-19 LAB — I-STAT TROPONIN, ED: TROPONIN I, POC: 0 ng/mL (ref 0.00–0.08)

## 2018-01-19 LAB — I-STAT CG4 LACTIC ACID, ED
Lactic Acid, Venous: 1.63 mmol/L (ref 0.5–1.9)
Lactic Acid, Venous: 1.16 mmol/L (ref 0.5–1.9)

## 2018-01-19 MED ORDER — ENOXAPARIN SODIUM 40 MG/0.4ML ~~LOC~~ SOLN
40.0000 mg | SUBCUTANEOUS | Status: DC
  Administered 2018-01-19: 40 mg via SUBCUTANEOUS
  Filled 2018-01-19: qty 0.4

## 2018-01-19 MED ORDER — PROMETHAZINE HCL 25 MG PO TABS: 12.5000 mg | ORAL_TABLET | Freq: Four times a day (QID) | ORAL | Status: DC | PRN

## 2018-01-19 MED ORDER — VITAMIN B-12 1000 MCG PO TABS
1000.0000 ug | ORAL_TABLET | Freq: Every day | ORAL | Status: DC
  Administered 2018-01-19 – 2018-01-20 (×2): 1000 ug via ORAL
  Filled 2018-01-19 (×2): qty 1

## 2018-01-19 MED ORDER — ACETAMINOPHEN 650 MG RE SUPP: 650.0000 mg | Freq: Four times a day (QID) | RECTAL | Status: DC | PRN

## 2018-01-19 MED ORDER — SODIUM CHLORIDE 0.9 % IV BOLUS
1000.0000 mL | Freq: Once | INTRAVENOUS | Status: AC
  Administered 2018-01-19: 1000 mL via INTRAVENOUS

## 2018-01-19 MED ORDER — LISINOPRIL-HYDROCHLOROTHIAZIDE 20-12.5 MG PO TABS: 1.0000 | ORAL_TABLET | Freq: Every day | ORAL | Status: DC

## 2018-01-19 MED ORDER — ACETAMINOPHEN 325 MG PO TABS
650.0000 mg | ORAL_TABLET | Freq: Once | ORAL | Status: AC
  Administered 2018-01-19: 650 mg via ORAL
  Filled 2018-01-19: qty 2

## 2018-01-19 MED ORDER — SENNOSIDES-DOCUSATE SODIUM 8.6-50 MG PO TABS: 1.0000 | ORAL_TABLET | Freq: Every evening | ORAL | Status: DC | PRN

## 2018-01-19 MED ORDER — ASPIRIN EC 81 MG PO TBEC
81.0000 mg | DELAYED_RELEASE_TABLET | Freq: Every day | ORAL | Status: DC
  Administered 2018-01-19 – 2018-01-20 (×2): 81 mg via ORAL
  Filled 2018-01-19 (×2): qty 1

## 2018-01-19 MED ORDER — SODIUM CHLORIDE 0.9 % IV SOLN
2.0000 g | Freq: Once | INTRAVENOUS | Status: AC
  Administered 2018-01-19: 2 g via INTRAVENOUS
  Filled 2018-01-19: qty 2

## 2018-01-19 MED ORDER — ACETAMINOPHEN 325 MG PO TABS: 650.0000 mg | ORAL_TABLET | Freq: Four times a day (QID) | ORAL | Status: DC | PRN

## 2018-01-19 MED ORDER — VANCOMYCIN HCL IN DEXTROSE 1-5 GM/200ML-% IV SOLN
1000.0000 mg | Freq: Once | INTRAVENOUS | Status: AC
  Administered 2018-01-19: 1000 mg via INTRAVENOUS
  Filled 2018-01-19 (×2): qty 200

## 2018-01-19 NOTE — ED Notes (Signed)
Phlebotomy @ bedside  

## 2018-01-19 NOTE — ED Notes (Signed)
Patient transported to X-ray 

## 2018-01-19 NOTE — ED Provider Notes (Signed)
[facility] EMERGENCY DEPARTMENT Provider Note   CSN: [id number] Arrival date & time: 01/19/18  1245     History   Chief Complaint Chief Complaint  Patient presents with  . Code Sepsis  . flu like symptoms    HPI [name] is a 77 y.o. male.  [name] is a 77 y.o. male with a history of asthma, hypertension, hyperlipidemia, PE, not currently on any anticoagulants, hyperlipidemia, who presents to the emergency department for evaluation of fever, weakness and feeling generally unwell over the last 2 to 3 days.  He reports he has had no appetite and been nauseated but has not had any vomiting.  He denies any rhinorrhea, sore throat, cough, chest pain or shortness of breath.  No abdominal pain, diarrhea, melena or hematochezia.  He denies any dysuria or urinary frequency.  Has not taken anything prior to arrival to treat his symptoms.  Denies any known sick contacts.  No headaches, vision changes, numbness or weakness, no neck pain or stiffness.     Past Medical History:  Diagnosis Date  . Aortic root dilatation ([place]) 2012   Seen on Echo in 2012.  66mm  . Asthma, chronic 07/13/2013   PFT's 05/2012 : FEV1 / FVC 75 and 78 pre and post BD. FEV1 74 and 75 pre and post BD. TLC 72. RV 93. DLCO 79. Read as restrictive lung disease, possible with obstructive lung disease. Prior smoker.    Marland Kitchen BPH (benign prostatic hyperplasia)   . Colitis    3-4 ulcers on colonoscopy in 12/05  . Depression   . Diverticulitis    colonoscopy in 12/05  . Gastritis   . History of hypercholesterolemia   . HTN (hypertension)   . Hypercholesterolemia   . Osteoarthritis    Primarily left knee, acute right knee swelling in 9/07  . Other chest pain    Adenosine myoview in 2004 showing EF 52% with no evidence for ischemia.  There was a basal inferior fixed defect that my have been artifact.  . PE (pulmonary thromboembolism) ([place]) 09/13/2006   In 2008. On Warfarin until 01/2010 when he  was admitted for syncope and noticed that he had completed course of warfarin and it was stopped.   . Syncope    Suspect neurocardiogenic.  Always with standing or micturation.  Had bradycardic/hypotensive response with standing when hospitalized in 1/12.  On Florinef.  Echo (1/12): EF 60-65%, mild aortic root dilation.   . Wrist disorder    Proximal carpo-scapholunate dissosiation -seen by Dr. Derryl Harbor    Patient Active Problem List   Diagnosis Date Noted  . Muscle spasm of back 12/15/2015  . Gout, chronic, without tophus 10/12/2015  . Counseling regarding end of life decision making 04/13/2015  . OA (osteoarthritis) of knee 11/05/2013  . BPH (benign prostatic hyperplasia) 08/04/2013  . Asthma, chronic 07/13/2013  . Healthcare maintenance 11/26/2012  . Glaucoma 06/12/2012  . Hematuria 04/10/2012  . Thoracic aortic aneurysm ([place]) 01/10/2012  . Herpes infection 09/14/2010  . Vitamin B 12 deficiency 02/21/2010  . History of pulmonary embolism 09/13/2006  . Essential hypertension 12/12/2005    Past Surgical History:  Procedure Laterality Date  . I&D EXTREMITY Right 03/04/2014   Procedure: IRRIGATION AND DEBRIDEMENT FINGER / HAND;  Surgeon: Dayna Barker, MD;  Location: [facility];  Service: Plastics;  Laterality: Right;  I&D Right Small Finger and Right Wrist  . TRANSURETHRAL RESECTION OF PROSTATE  3/05   [name]  Home Medications    Prior to Admission medications   Medication Sig Start Date End Date Taking? Authorizing Provider  acetaminophen (TYLENOL) 500 MG tablet Take 500 mg by mouth every 6 (six) hours as needed for mild pain or moderate pain.    [provider]  albuterol (PROVENTIL HFA;VENTOLIN HFA) 108 (90 Base) MCG/ACT inhaler Inhale 2 puffs into the lungs every 6 (six) hours as needed for wheezing or shortness of breath. 04/07/17   Bartholomew Crews, MD  aspirin EC 81 MG tablet Take 81 mg by mouth daily.     [provider]  colchicine 0.6 MG  tablet Take 1 tablet (0.6 mg total) by mouth 2 (two) times daily as needed for up to 3 days. 10/02/17 10/05/17  Masoudi, Dorthula Rue, MD  cyanocobalamin ([employer] VITAMIN B12) 1000 MCG tablet Take 1 tablet (1,000 mcg total) by mouth daily. 04/03/17   Bartholomew Crews, MD  ibuprofen (ADVIL) 200 MG tablet Take 3 tablets (600 mg total) by mouth daily as needed for moderate pain (Take with food and water). 10/02/17   Masoudi, Elhamalsadat, MD  latanoprost (XALATAN) 0.005 % ophthalmic solution Place 1 drop into both eyes at bedtime.    Juluis Rainier  lisinopril-hydrochlorothiazide (PRINZIDE,ZESTORETIC) 20-12.5 MG tablet Take 1 tablet by mouth daily. 04/03/17   Bartholomew Crews, MD  pyridOXINE (VITAMIN B-6) 100 MG tablet Take 100 mg by mouth daily.    [provider]    Family History Family History  Problem Relation Age of Onset  . Heart disease Father   . Stroke Father   . Heart disease Brother 71    Social History Social History   Tobacco Use  . Smoking status: Former Research scientist (life sciences)  . Smokeless tobacco: Never Used  Substance Use Topics  . Alcohol use: No  . Drug use: No     Allergies   Patient has no known allergies.   Review of Systems Review of Systems  Constitutional: Positive for chills, fatigue and fever.  HENT: Negative for congestion, rhinorrhea and sore throat.   Eyes: Negative for visual disturbance.  Respiratory: Negative for cough, shortness of breath and wheezing.   Cardiovascular: Negative for chest pain.  Gastrointestinal: Positive for nausea. Negative for abdominal pain, constipation, diarrhea and vomiting.  Genitourinary: Negative for dysuria, flank pain and frequency.  Musculoskeletal: Negative for arthralgias, myalgias, neck pain and neck stiffness.  Skin: Negative for color change and rash.  Neurological: Positive for weakness. Negative for dizziness, light-headedness, numbness and headaches.     Physical Exam Updated Vital Signs BP (!) 127/53 (BP  Location: Right Arm)   Pulse 73   Temp (!) 101.3 F (38.5 C) (Oral)   Resp 17   Ht 5\' 11"  (1.803 m)   Wt 90.3 kg   SpO2 97%   BMI 27.75 kg/m   Physical Exam Vitals signs and nursing note reviewed.  Constitutional:      General: He is not in acute distress.    Appearance: He is well-developed. He is not diaphoretic.  HENT:     Head: Normocephalic and atraumatic.     Nose: Nose normal.     Mouth/Throat:     Mouth: Mucous membranes are moist.     Pharynx: Oropharynx is clear. No oropharyngeal exudate or posterior oropharyngeal erythema.  Eyes:     General:        Right eye: No discharge.        Left eye: No discharge.     Pupils: Pupils  are equal, round, and reactive to light.  Neck:     Musculoskeletal: Normal range of motion and neck supple. No neck rigidity.  Cardiovascular:     Rate and Rhythm: Normal rate and regular rhythm.     Pulses: Normal pulses.     Heart sounds: Normal heart sounds. No murmur. No friction rub. No gallop.   Pulmonary:     Effort: Pulmonary effort is normal. No respiratory distress.     Breath sounds: Normal breath sounds. No wheezing or rales.     Comments: Respirations equal and unlabored, patient able to speak in full sentences, lungs clear to auscultation bilaterally Abdominal:     General: Abdomen is flat. Bowel sounds are normal. There is no distension.     Palpations: Abdomen is soft. There is no mass.     Tenderness: There is no abdominal tenderness. There is no guarding.     Comments: Abdomen soft, nondistended, nontender to palpation in all quadrants without guarding or peritoneal signs  Musculoskeletal:        General: No deformity.  Skin:    General: Skin is warm and dry.     Capillary Refill: Capillary refill takes less than 2 seconds.  Neurological:     Mental Status: He is alert and oriented to person, place, and time. Mental status is at baseline.     Coordination: Coordination normal.     Comments: Speech is clear, able to  follow commands CN III-XII intact Normal strength in upper and lower extremities bilaterally including dorsiflexion and plantar flexion, strong and equal grip strength Sensation normal to light and sharp touch Moves extremities without ataxia, coordination intact  Psychiatric:        Mood and Affect: Mood normal.        Behavior: Behavior normal.      ED Treatments / Results  Labs (all labs ordered are listed, but only abnormal results are displayed) Labs Reviewed  COMPREHENSIVE METABOLIC PANEL - Abnormal; Notable for the following components:      Result Value   Creatinine, Ser 1.65 (*)    GFR calc non Af Amer 40 (*)    GFR calc Af Amer 46 (*)    All other components within normal limits  CBC WITH DIFFERENTIAL/PLATELET - Abnormal; Notable for the following components:   WBC 11.6 (*)    Abs Immature Granulocytes 0.10 (*)    All other components within normal limits  CULTURE, BLOOD (ROUTINE X 2)  CULTURE, BLOOD (ROUTINE X 2)  URINE CULTURE  INFLUENZA PANEL BY PCR (TYPE A & B)  URINALYSIS, ROUTINE W REFLEX MICROSCOPIC  I-STAT CG4 LACTIC ACID, ED  I-STAT TROPONIN, ED  I-STAT CG4 LACTIC ACID, ED    EKG None  Radiology Dg Chest 2 View  Result Date: 01/19/2018 CLINICAL DATA:  Fever EXAM: CHEST - 2 VIEW COMPARISON:  08/25/2014, 12/10/2015 FINDINGS: The heart size and mediastinal contours are within normal limits. Both lungs are clear. Mild degenerative changes of the spine. IMPRESSION: No active cardiopulmonary disease. Electronically Signed   By: Donavan Foil M.D.   On: 01/19/2018 14:30    Procedures Procedures (including critical care time)  Medications Ordered in ED Medications  vancomycin (VANCOCIN) IVPB 1000 mg/200 mL premix (has no administration in time range)  ceFEPIme (MAXIPIME) 2 g in sodium chloride 0.9 % 100 mL IVPB (has no administration in time range)  sodium chloride 0.9 % bolus 1,000 mL (0 mLs Intravenous Stopped 01/19/18 1559)  acetaminophen (TYLENOL)  tablet 650  mg (650 mg Oral Given 01/19/18 1448)     Initial Impression / Assessment and Plan / ED Course  I have reviewed the triage vital signs and the nursing notes.  Pertinent labs & imaging results that were available during my care of the patient were reviewed by me and considered in my medical decision making (see chart for details).  Patient presents for evaluation of fevers, general malaise and weakness and nausea.  He denies any other focal symptoms.  On arrival patient febrile at 101.3 with blood pressure of 76/47, this blood pressure seemed to improve without intervention and is since stabilized in the 110s, no hypoxia or tachycardia.  Lungs are clear to auscultation, abdominal exam is benign.  Will check basic labs, urinalysis, chest x-ray, lactic acid, blood cultures and influenza panel.  Will give IV fluids and Tylenol.  While patient meets sepsis criteria he does not appear to be in severe sepsis, and is very well-appearing.   Lactic acid is 1.68, blood cultures pending, troponin is negative and EKG without concerning ischemic changes.  Mild leukocytosis of 11.6, normal hemoglobin, no acute electrolyte derangements, creatinine slightly increased at 1.65, normal liver function.  Awaiting urinalysis and culture.  Chest x-ray shows no evidence of pneumonia or other active cardiopulmonary disease.  After fluids blood pressure holding steady in the 90-100s with map of 60.  Unclear source of infection patient will be started on broad-spectrum antibiotics.  Awaiting urinalysis.  Feels that initial episode of hypotension in the setting of fever warrants observational admission with continued antibiotics.  Will consult internal medicine teaching service for admission.  Case discussed with Dr. Laural Golden who will see and admit the patient.  Final Clinical Impressions(s) / ED Diagnoses   Final diagnoses:  Sepsis without acute organ dysfunction, due to unspecified organism [facility])    ED Discharge  Orders    None       Jacqlyn Larsen, [place] 01/19/18 1635    Sherwood Gambler, MD 01/22/18 2131
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
## 2018-01-19 NOTE — ED Triage Notes (Signed)
C/o fever/ weakness/not feeling good for "a couple days" no appetite, nauseated-- no vomiting

## 2018-01-19 NOTE — ED Notes (Signed)
Patient aware of need for urine sample. Unable to provide one at this time.

## 2018-01-20 DIAGNOSIS — E785 Hyperlipidemia, unspecified: Secondary | ICD-10-CM | POA: Diagnosis not present

## 2018-01-20 DIAGNOSIS — I10 Essential (primary) hypertension: Secondary | ICD-10-CM | POA: Diagnosis not present

## 2018-01-20 DIAGNOSIS — Z87891 Personal history of nicotine dependence: Secondary | ICD-10-CM | POA: Diagnosis not present

## 2018-01-20 DIAGNOSIS — Z86711 Personal history of pulmonary embolism: Secondary | ICD-10-CM | POA: Diagnosis not present

## 2018-01-20 DIAGNOSIS — A419 Sepsis, unspecified organism: Secondary | ICD-10-CM | POA: Diagnosis not present

## 2018-01-20 DIAGNOSIS — Z7982 Long term (current) use of aspirin: Secondary | ICD-10-CM | POA: Diagnosis not present

## 2018-01-20 DIAGNOSIS — J45909 Unspecified asthma, uncomplicated: Secondary | ICD-10-CM | POA: Diagnosis not present

## 2018-01-20 DIAGNOSIS — Z79899 Other long term (current) drug therapy: Secondary | ICD-10-CM | POA: Diagnosis not present

## 2018-01-20 DIAGNOSIS — M109 Gout, unspecified: Secondary | ICD-10-CM | POA: Diagnosis not present

## 2018-01-20 LAB — CBC
HCT: 37 % — ABNORMAL LOW (ref 39.0–52.0)
Hemoglobin: 12.6 g/dL — ABNORMAL LOW (ref 13.0–17.0)
MCH: 28.5 pg (ref 26.0–34.0)
MCHC: 34.1 g/dL (ref 30.0–36.0)
MCV: 83.7 fL (ref 80.0–100.0)
Platelets: 256 10*3/uL (ref 150–400)
RBC: 4.42 MIL/uL (ref 4.22–5.81)
RDW: 14.6 % (ref 11.5–15.5)
WBC: 12.9 10*3/uL — ABNORMAL HIGH (ref 4.0–10.5)
nRBC: 0 % (ref 0.0–0.2)

## 2018-01-20 LAB — BASIC METABOLIC PANEL
Anion gap: 11 (ref 5–15)
BUN: 17 mg/dL (ref 8–23)
CO2: 22 mmol/L (ref 22–32)
Calcium: 8.8 mg/dL — ABNORMAL LOW (ref 8.9–10.3)
Chloride: 101 mmol/L (ref 98–111)
Creatinine, Ser: 1.41 mg/dL — ABNORMAL HIGH (ref 0.61–1.24)
GFR calc Af Amer: 56 mL/min — ABNORMAL LOW (ref >60)
GFR calc non Af Amer: 48 mL/min — ABNORMAL LOW (ref >60)
Glucose, Bld: 82 mg/dL (ref 70–99)
Potassium: 4.2 mmol/L (ref 3.5–5.1)
Sodium: 134 mmol/L — ABNORMAL LOW (ref 135–145)

## 2018-01-20 LAB — TROPONIN I
Troponin I: <0.03 ng/mL (ref <0.03)
Troponin I: <0.03 ng/mL (ref <0.03)

## 2018-01-20 NOTE — Progress Notes (Signed)
   Subjective: No overnight events. [name] reports that he feels better today because he hasn't had fevers or chills. He continues to have mild nausea, but was able to eat breakfast this morning. He endorses good appetite. He denies headaches or rashes. He has a lot of young grandchildren who he spends time with, but denies sick contacts. He has no new symptoms or concerns at this time.  Objective:  Vital signs in last 24 hours: Vitals:   01/19/18 1804 01/19/18 2200 01/20/18 0500 01/20/18 0509  BP: (!) 115/57 (!) 104/51  119/60  Pulse: 71 66  65  Resp: 16 18  17   Temp: 98.9 F (37.2 C) 98.3 F (36.8 C)  98.3 F (36.8 C)  TempSrc: Oral Oral  Oral  SpO2: 100% 98%  99%  Weight: 86.9 kg  85.7 kg   Height: 5\' 11"  (1.803 m)      Physical Exam  Constitutional: He is oriented to person, place, and time and well-developed, well-nourished, and in no distress.  Cardiovascular: Normal rate, regular rhythm and normal heart sounds.  No murmur heard. Pulmonary/Chest: Effort normal and breath sounds normal. No respiratory distress. He has no wheezes.  Abdominal: Soft. Bowel sounds are normal. He exhibits no distension. There is no abdominal tenderness.  Musculoskeletal:        General: Tenderness present. No edema.  Neurological: He is alert and oriented to person, place, and time.  Skin: Skin is warm and dry.  Psychiatric: Mood, memory, affect and judgment normal.    Assessment/Plan:  Active Problems:   Sepsis [facility])  [name] is a 77 y.o male with a PMHx of HTN, HLD, asthma, gout and PE not on anticoagulation who presents to the ED for evaluation of fever, weakness and generally feeling unwell since Sunday.  Sepsis Patient is afebrile and normotensive today. WBC is 12.9, up from 11.6. CXR is normal, influenza panel negative, blood cultures show no growth in <24 hours, UA normal. Given one dose of cefepime and vancomycin yesterday. Patient denies any new symptoms, pain and said his  nausea and appetite have improved. Unclear etiology, will discontinue antibiotics and if patient is able to ambulate well and feels okay this afternoon we may discharge him home later today.  HTN BP 119/60 today. Will resume home medications at discharge  Dispo: Anticipated discharge in approximately 0-1 days.  Koreen Lizaola N, DO 01/20/2018, 8:20 AM Pager: [phone]

## 2018-01-20 NOTE — Progress Notes (Signed)
[name] to be D/C'd home per MD order. Discussed with the patient and all questions fully answered.   Skin clean, dry and intact without evidence of skin break down, no evidence of skin tears noted.  IV catheter discontinued intact. Site without signs and symptoms of complications. Dressing and pressure applied.  An After Visit Summary was printed and given to the patient.  Patient escorted via [place], and D/C home via private auto.  Tama High  01/20/2018 5:36 PM

## 2018-01-20 NOTE — Care Management Obs Status (Signed)
[place] NOTIFICATION   Patient Details  Name: [name] MRN: [id number] Date of Birth: 11/13/1940   Medicare Observation Status Notification Given:  Yes    Sharin Mons, RN 01/20/2018, 2:56 PM

## 2018-01-20 NOTE — Discharge Summary (Signed)
Name: [name] MRN: [id number] DOB: 1940/03/06 77 y.o. PCP: [name] Crews, MD  Date of Admission: 01/19/2018  1:18 PM Date of Discharge: 01/20/2018 Attending Physician: [name] Kilts, MD  Discharge Diagnosis: 1. Sepsis  Discharge Medications: Allergies as of 01/20/2018   No Known Allergies     Medication List    TAKE these medications   aspirin EC 81 MG tablet Take 81 mg by mouth daily.   cyanocobalamin 1000 MCG tablet Commonly known as:  [facility]S VITAMIN B12 Take 1 tablet (1,000 mcg total) by mouth daily.   lisinopril-hydrochlorothiazide 20-12.5 MG tablet Commonly known as:  PRINZIDE,ZESTORETIC Take 1 tablet by mouth daily.   pyridOXINE 100 MG tablet Commonly known as:  VITAMIN B-6 Take 100 mg by mouth daily.       Disposition and follow-up:   [name] was discharged from [facility] in Stable condition.  At the hospital follow up visit please address:  1.  Sepsis-patient presented with fever and hypotension, unclear etiology; make sure patient has not had recurring fevers or other s/sx of infection  2.  Labs / imaging needed at time of follow-up: none  3.  Pending labs/ test needing follow-up: blood cultures  Follow-up Appointments: Patient is to follow up with PCP in one week.  Hospital Course by problem list: 1. Sepsis-on initial presentation patient was febrile to 101.3 and had a blood pressure of 76/47.  His blood pressure improved after IV fluid administration.  Lactate was 1.6, the remainder of his labs were unremarkable.  He was negative for the flu, urine analysis was normal and chest x-ray showed no acute change.  Blood and urine cultures showed no growth at 24 hours.  Unclear source of infection.  No fevers overnight and vitals remained stable.  He reported that he had recent contacts in his family with viral illnesses.  Given 1 dose of cefepime and vancomycin.  No evidence of acute bacterial infection so  antibiotics were stopped after 1 day.    Discharge Vitals:   BP 139/82 (BP Location: Right Arm)   Pulse 68   Temp 98.6 F (37 C) (Oral)   Resp 18   Ht 5\' 11"  (1.803 m)   Wt 85.7 kg   SpO2 94%   BMI 26.35 kg/m   Pertinent Labs, Studies, and Procedures:   CBC Latest Ref Rng & Units 01/20/2018 01/19/2018 08/25/2014  WBC 4.0 - 10.5 K/uL 12.9(H) 11.6(H) 6.7  Hemoglobin 13.0 - 17.0 g/dL 12.6(L) 14.6 13.8  Hematocrit 39.0 - 52.0 % 37.0(L) 45.0 39.4  Platelets 150 - 400 K/uL 256 250 295   Urinalysis    Component Value Date/Time   COLORURINE YELLOW 01/19/2018 2045   APPEARANCEUR CLEAR 01/19/2018 2045   LABSPEC 1.017 01/19/2018 2045   PHURINE 5.0 01/19/2018 2045   GLUCOSEU NEGATIVE 01/19/2018 2045   GLUCOSEU NEG mg/dL 02/05/2010 2131   HGBUR NEGATIVE 01/19/2018 2045   BILIRUBINUR NEGATIVE 01/19/2018 2045   BILIRUBINUR Negative 04/10/2012 1606   KETONESUR NEGATIVE 01/19/2018 2045   PROTEINUR NEGATIVE 01/19/2018 2045   UROBILINOGEN 1.0 04/28/2014 1325   NITRITE NEGATIVE 01/19/2018 2045   LEUKOCYTESUR NEGATIVE 01/19/2018 2045    Dg Chest 2 View  Result Date: 01/19/2018 CLINICAL DATA:  Fever EXAM: CHEST - 2 VIEW COMPARISON:  08/25/2014, 12/10/2015 FINDINGS: The heart size and mediastinal contours are within normal limits. Both lungs are clear. Mild degenerative changes of the spine. IMPRESSION: No active cardiopulmonary disease. Electronically Signed   By: [name]  [name] M.D.   On: 01/19/2018 14:30   Blood Culture    Component Value Date/Time   SDES URINE, RANDOM 01/19/2018 2108   SPECREQUEST NONE 01/19/2018 2108   CULT (A) 01/19/2018 2108    <10,000 COLONIES/mL INSIGNIFICANT GROWTH Performed at [facility], [place] [address] [place], [place] [address]    REPTSTATUS 01/21/2018 FINAL 01/19/2018 2108     Discharge Instructions: Discharge Instructions    Diet - low sodium heart healthy   Complete by:  As directed    Discharge instructions   Complete by:  As directed     [name], [name] were found to be septic when you came to the ED and hospitalized due to your fever and low blood pressure. We are not sure what caused this but your work up has been negative for pneumonia, influenza or bacteremia thus far. It may be due to a viral illness. We treated you with one dose of antibiotics and fluids.   I want you to follow up with your PCP in 1 week.  Thank you for allowing [place] to be a part of your care and Happy Holidays!   Increase activity slowly   Complete by:  As directed       Signed: Rehman, Charlsie Quest, DO 01/20/2018, 3:35 PM   Pager: [phone]

## 2018-01-21 LAB — URINE CULTURE: Culture: <10000 — AB

## 2018-01-24 LAB — CULTURE, BLOOD (ROUTINE X 2)
Culture: NO GROWTH
Culture: NO GROWTH

## 2018-04-09 ENCOUNTER — Other Ambulatory Visit: Payer: Self-pay

## 2018-04-09 ENCOUNTER — Ambulatory Visit (INDEPENDENT_AMBULATORY_CARE_PROVIDER_SITE_OTHER): Payer: Medicare HMO | Admitting: Internal Medicine

## 2018-04-09 ENCOUNTER — Encounter: Payer: Self-pay | Admitting: Internal Medicine

## 2018-04-09 VITALS — BP 133/72 | HR 58 | Temp 98.6°F | Resp 20 | Wt 206.8 lb

## 2018-04-09 DIAGNOSIS — I712 Thoracic aortic aneurysm, without rupture, unspecified: Secondary | ICD-10-CM

## 2018-04-09 DIAGNOSIS — I10 Essential (primary) hypertension: Secondary | ICD-10-CM | POA: Diagnosis not present

## 2018-04-09 DIAGNOSIS — J45909 Unspecified asthma, uncomplicated: Secondary | ICD-10-CM

## 2018-04-09 DIAGNOSIS — M1A9XX1 Chronic gout, unspecified, with tophus (tophi): Secondary | ICD-10-CM | POA: Diagnosis not present

## 2018-04-09 DIAGNOSIS — Z79899 Other long term (current) drug therapy: Secondary | ICD-10-CM | POA: Diagnosis not present

## 2018-04-09 DIAGNOSIS — M1A3710 Chronic gout due to renal impairment, right ankle and foot, without tophus (tophi): Secondary | ICD-10-CM

## 2018-04-09 DIAGNOSIS — D72829 Elevated white blood cell count, unspecified: Secondary | ICD-10-CM

## 2018-04-09 DIAGNOSIS — E538 Deficiency of other specified B group vitamins: Secondary | ICD-10-CM

## 2018-04-09 DIAGNOSIS — J4520 Mild intermittent asthma, uncomplicated: Secondary | ICD-10-CM

## 2018-04-09 MED ORDER — CYANOCOBALAMIN 1000 MCG PO TABS: 1000.0000 ug | ORAL_TABLET | Freq: Every day | ORAL | 3 refills | Status: DC

## 2018-04-09 MED ORDER — ALBUTEROL SULFATE HFA 108 (90 BASE) MCG/ACT IN AERS: 1.0000 | INHALATION_SPRAY | Freq: Four times a day (QID) | RESPIRATORY_TRACT | 4 refills | Status: DC | PRN

## 2018-04-09 MED ORDER — LISINOPRIL-HYDROCHLOROTHIAZIDE 20-12.5 MG PO TABS: 1.0000 | ORAL_TABLET | Freq: Every day | ORAL | 3 refills | Status: DC

## 2018-04-09 MED ORDER — COLCHICINE 0.6 MG PO TABS: 0.6000 mg | ORAL_TABLET | Freq: Every day | ORAL | 2 refills | Status: DC

## 2018-04-09 NOTE — Assessment & Plan Note (Signed)
This problem is chronic and stable.  He is unable to get an albuterol inhaler as his co-pay has been $90.  I gave him a sample at his appointment last year.  He thinks that it does help when he occasionally gets dyspnea and would like to have an inhaler.  I worked with our pharmacy team, Vonna Kotyk, who is assisting him to get an affordable albuterol inhaler.  PLAN:  Cont current meds

## 2018-04-09 NOTE — Assessment & Plan Note (Signed)
This problem is chronic and stable.  He is on a lisinopril 20 mg and HCTZ 12.5 mg combination pill.  He has no side effects to this medication and his blood pressure is at goal.  PLAN:  Cont current meds BMP  BP Readings from Last 3 Encounters:  01/20/18 139/82  10/02/17 133/72  09/07/17 (!) 105/93

## 2018-04-09 NOTE — Assessment & Plan Note (Signed)
This problem is chronic and stable.  He takes an oral B12 supplement daily.  He has no symptoms of vitamin D deficiency.  His last vitamin B12 level was 363 in 2017 after he started supplementation.  PLAN:  Cont current meds Check B12 level today

## 2018-04-09 NOTE — Assessment & Plan Note (Signed)
This problem is chronic and stable.  I reviewed my notes which indicated that his ascending aorta was normal in size on the most recent echo in 2017.  Therefore, I do not think it needs further imaging.

## 2018-04-09 NOTE — Patient Instructions (Addendum)
1. Bring a picture of your goat next appointment 2. I sent in Colchicine. Only take it when you have a gout flare. 3. I will mail you your test results

## 2018-04-09 NOTE — Progress Notes (Signed)
   Subjective:    Patient ID: [name], male    DOB: [birth year], 78 y.o.   MRN: [id number]  HPI  GUSTAVE LINDEMAN is here for HFU. Please see the A&P for the status of the pt's chronic medical problems.  ROS : per ROS section and in problem oriented charting. All other systems are negative.  PMHx, Soc hx, and / or Fam hx : He still has his goat who likes to eat apples.  He did not get rid of all of his bagels and they were able to kill 3 rabbits in his yard recently.  He is looking forward to fishing with a friend or his pastor on the bank.  Review of Systems  Constitutional: Positive for unexpected weight change.  Eyes: Negative for visual disturbance.  Respiratory: Negative for shortness of breath.   Cardiovascular: Negative for chest pain.  Musculoskeletal: Negative for arthralgias and gait problem.  Psychiatric/Behavioral: Negative for sleep disturbance.       Objective:   Physical Exam Constitutional:      General: He is not in acute distress.    Appearance: Normal appearance. He is obese. He is not ill-appearing.  HENT:     Head: Normocephalic and atraumatic.     Right Ear: External ear normal.     Left Ear: External ear normal.     Nose: Nose normal.  Eyes:     Extraocular Movements: Extraocular movements intact.     Conjunctiva/sclera: Conjunctivae normal.  Cardiovascular:     Rate and Rhythm: Normal rate and regular rhythm.     Heart sounds: No murmur.  Pulmonary:     Effort: Pulmonary effort is normal.     Breath sounds: Normal breath sounds. No wheezing.  Musculoskeletal:        General: No swelling or deformity.  Skin:    General: Skin is warm and dry.  Neurological:     General: No focal deficit present.     Mental Status: He is alert.     Comments: Gait is normal  Psychiatric:        Mood and Affect: Mood normal.        Behavior: Behavior normal.        Thought Content: Thought content normal.        Judgment: Judgment normal.            Assessment & Plan:

## 2018-04-09 NOTE — Assessment & Plan Note (Signed)
This problem is chronic and stable.  He went to the ER on August 19 when he had a flare in his left great toe.  He was prescribed prednisone and colchicine and he had complete resolution.  He states he gets 1 attack a year although previously I had documented it has been a long time since he had an attack.  He does not meet criteria to be on daily prophylaxis.  I went ahead and prescribed colchicine 0.6 twice daily for 3 days with a couple of refills and explained in person and on his AVS to only take it when he gets a gout attack.  I also asked him to call us if he has to take the colchicine.  PLAN: Colchicine as needed for attacks

## 2018-04-10 ENCOUNTER — Encounter: Payer: Self-pay | Admitting: Internal Medicine

## 2018-04-10 LAB — CBC
Hematocrit: 39.5 % (ref 37.5–51.0)
Hemoglobin: 13 g/dL (ref 13.0–17.7)
MCH: 27.9 pg (ref 26.6–33.0)
MCHC: 32.9 g/dL (ref 31.5–35.7)
MCV: 85 fL (ref 79–97)
Platelets: 296 10*3/uL (ref 150–450)
RBC: 4.66 x10E6/uL (ref 4.14–5.80)
RDW: 15 % (ref 11.6–15.4)
WBC: 6.2 10*3/uL (ref 3.4–10.8)

## 2018-04-10 LAB — BMP8+ANION GAP
Anion Gap: 16 mmol/L (ref 10.0–18.0)
BUN/Creatinine Ratio: 7 — ABNORMAL LOW (ref 10–24)
BUN: 8 mg/dL (ref 8–27)
CO2: 23 mmol/L (ref 20–29)
Calcium: 9.1 mg/dL (ref 8.6–10.2)
Chloride: 96 mmol/L (ref 96–106)
Creatinine, Ser: 1.15 mg/dL (ref 0.76–1.27)
GFR calc Af Amer: 71 mL/min/{1.73_m2} (ref >59)
GFR calc non Af Amer: 61 mL/min/{1.73_m2} (ref >59)
Glucose: 80 mg/dL (ref 65–99)
Potassium: 3.8 mmol/L (ref 3.5–5.2)
Sodium: 135 mmol/L (ref 134–144)

## 2018-04-10 LAB — VITAMIN B12: Vitamin B-12: 776 pg/mL (ref 232–1245)

## 2018-08-28 ENCOUNTER — Encounter (HOSPITAL_COMMUNITY): Payer: Self-pay | Admitting: Emergency Medicine

## 2018-08-28 ENCOUNTER — Emergency Department (HOSPITAL_COMMUNITY)
Admission: EM | Admit: 2018-08-28 | Discharge: 2018-08-28 | Disposition: A | Discharge status: Home / Self Care | Payer: Medicare HMO | Attending: Emergency Medicine | Admitting: Emergency Medicine

## 2018-08-28 DIAGNOSIS — Z86711 Personal history of pulmonary embolism: Secondary | ICD-10-CM | POA: Diagnosis not present

## 2018-08-28 DIAGNOSIS — Z79899 Other long term (current) drug therapy: Secondary | ICD-10-CM | POA: Insufficient documentation

## 2018-08-28 DIAGNOSIS — I10 Essential (primary) hypertension: Secondary | ICD-10-CM | POA: Insufficient documentation

## 2018-08-28 DIAGNOSIS — M25531 Pain in right wrist: Secondary | ICD-10-CM

## 2018-08-28 DIAGNOSIS — Z87891 Personal history of nicotine dependence: Secondary | ICD-10-CM | POA: Insufficient documentation

## 2018-08-28 DIAGNOSIS — Z7982 Long term (current) use of aspirin: Secondary | ICD-10-CM | POA: Diagnosis not present

## 2018-08-28 DIAGNOSIS — Z8709 Personal history of other diseases of the respiratory system: Secondary | ICD-10-CM | POA: Insufficient documentation

## 2018-08-28 DIAGNOSIS — M25562 Pain in left knee: Secondary | ICD-10-CM | POA: Diagnosis not present

## 2018-08-28 LAB — I-STAT CREATININE, ED: Creatinine, Ser: 1.2 mg/dL (ref 0.61–1.24)

## 2018-08-28 MED ORDER — PREDNISONE 20 MG PO TABS
60.0000 mg | ORAL_TABLET | Freq: Once | ORAL | Status: AC
  Administered 2018-08-28: 60 mg via ORAL
  Filled 2018-08-28: qty 3

## 2018-08-28 MED ORDER — PREDNISONE 20 MG PO TABS: 40.0000 mg | ORAL_TABLET | Freq: Every day | ORAL | 0 refills | Status: AC

## 2018-08-28 NOTE — ED Triage Notes (Signed)
Pt arrives to ed with c/o of right hand swelling and left knee. Hx of gout.

## 2018-08-28 NOTE — ED Provider Notes (Signed)
[facility] EMERGENCY DEPARTMENT Provider Note   CSN: [id number] Arrival date & time: 08/28/18  2725    History   Chief Complaint Chief Complaint  Patient presents with  . Joint Swelling    HPI [name] is a 79 y.o. male with history of BPH, diverticulitis, hypertension, hyperlipidemia, osteoarthritis, and gout presents for evaluation of acute onset, persistent right wrist pain for 8 days, and acute onset, progressively improving left knee pain and swelling for several weeks.  No history of trauma relating to either.  With regards to his left knee pain, he describes as a mild soreness.  It has improved with the application of ice and taking Tylenol.  Denies numbness or tingling.  Pain does not radiate.  At this point he reports it is not bothering him very much but feels as though he has some fluid in his knee.  With regards to his wrist pain he describes it as a throbbing pain primarily along the ulnar aspect of the right wrist.  It worsens with any movement.  Reports it feels similar to prior gout flares.  Has been taking Tylenol and is on medications without relief of his symptoms.  Occasionally eats red meat.  Denies fevers, nausea, vomiting, numbness or weakness.  Pain does not radiate.     The history is provided by the patient.    Past Medical History:  Diagnosis Date  . Aortic root dilatation ([place]) 2012   Seen on Echo in 2012.  99mm  . Asthma, chronic 07/13/2013   PFT's 05/2012 : FEV1 / FVC 75 and 78 pre and post BD. FEV1 74 and 75 pre and post BD. TLC 72. RV 93. DLCO 79. Read as restrictive lung disease, possible with obstructive lung disease. Prior smoker.    Marland Kitchen BPH (benign prostatic hyperplasia)   . Colitis    3-4 ulcers on colonoscopy in 12/05  . Depression   . Diverticulitis    colonoscopy in 12/05  . Gastritis   . History of hypercholesterolemia   . HTN (hypertension)   . Hypercholesterolemia   . Osteoarthritis    Primarily left knee,  acute right knee swelling in 9/07  . Other chest pain    Adenosine myoview in 2004 showing EF 52% with no evidence for ischemia.  There was a basal inferior fixed defect that my have been artifact.  . PE (pulmonary thromboembolism) ([place]) 09/13/2006   In 2008. On Warfarin until 01/2010 when he was admitted for syncope and noticed that he had completed course of warfarin and it was stopped.   . Syncope    Suspect neurocardiogenic.  Always with standing or micturation.  Had bradycardic/hypotensive response with standing when hospitalized in 1/12.  On Florinef.  Echo (1/12): EF 60-65%, mild aortic root dilation.   . Wrist disorder    Proximal carpo-scapholunate dissosiation -seen by Dr. Derryl Harbor    Patient Active Problem List   Diagnosis Date Noted  . Gout, chronic, without tophus 10/12/2015  . Counseling regarding end of life decision making 04/13/2015  . OA (osteoarthritis) of knee 11/05/2013  . BPH (benign prostatic hyperplasia) 08/04/2013  . Asthma, chronic 07/13/2013  . Healthcare maintenance 11/26/2012  . Glaucoma 06/12/2012  . Hematuria 04/10/2012  . Thoracic aortic aneurysm ([place]) 01/10/2012  . Herpes infection 09/14/2010  . Vitamin B 12 deficiency 02/21/2010  . History of pulmonary embolism 09/13/2006  . Essential hypertension 12/12/2005    Past Surgical History:  Procedure Laterality Date  . I&D EXTREMITY Right  03/04/2014   Procedure: IRRIGATION AND DEBRIDEMENT FINGER / HAND;  Surgeon: Dayna Barker, MD;  Location: [facility];  Service: Plastics;  Laterality: Right;  I&D Right Small Finger and Right Wrist  . TRANSURETHRAL RESECTION OF PROSTATE  3/05   [facility] Medications    Prior to Admission medications   Medication Sig Start Date End Date Taking? Authorizing Provider  albuterol (PROVENTIL HFA) 108 (90 Base) MCG/ACT inhaler Inhale 1-2 puffs into the lungs every 6 (six) hours as needed for wheezing or shortness of breath. 04/09/18   Bartholomew Crews, MD  aspirin  EC 81 MG tablet Take 81 mg by mouth daily.     [provider]  colchicine 0.6 MG tablet Take 1 tablet (0.6 mg total) by mouth daily. Take only when your gout flares 04/09/18 04/09/19  Bartholomew Crews, MD  cyanocobalamin ([facility] VITAMIN B12) 1000 MCG tablet Take 1 tablet (1,000 mcg total) by mouth daily. 04/09/18   Bartholomew Crews, MD  lisinopril-hydrochlorothiazide (PRINZIDE,ZESTORETIC) 20-12.5 MG tablet Take 1 tablet by mouth daily. 04/09/18   Bartholomew Crews, MD  predniSONE (DELTASONE) 20 MG tablet Take 2 tablets (40 mg total) by mouth daily with breakfast for 5 days. 08/28/18 09/02/18  Rodell Perna A, PA-C  pyridOXINE (VITAMIN B-6) 100 MG tablet Take 100 mg by mouth daily.    [provider]    Family History Family History  Problem Relation Age of Onset  . Heart disease Father   . Stroke Father   . Heart disease Brother 18    Social History Social History   Tobacco Use  . Smoking status: Former Research scientist (life sciences)  . Smokeless tobacco: Never Used  Substance Use Topics  . Alcohol use: No  . Drug use: No     Allergies   Patient has no known allergies.   Review of Systems Review of Systems  Constitutional: Negative for chills, diaphoresis and fever.  Respiratory: Negative for shortness of breath.   Cardiovascular: Negative for chest pain.  Gastrointestinal: Negative for abdominal pain, nausea and vomiting.  Musculoskeletal: Positive for arthralgias and joint swelling.  Neurological: Negative for weakness and numbness.  All other systems reviewed and are negative.    Physical Exam Updated Vital Signs BP 106/60   Pulse 68   Temp 98.4 F (36.9 C) (Oral)   Resp 16   SpO2 99%   Physical Exam Vitals signs and nursing note reviewed.  Constitutional:      General: He is not in acute distress.    Appearance: He is well-developed.  HENT:     Head: Normocephalic and atraumatic.  Eyes:     General:        Right eye: No discharge.        Left eye: No  discharge.     Conjunctiva/sclera: Conjunctivae normal.  Neck:     Vascular: No JVD.     Trachea: No tracheal deviation.  Cardiovascular:     Rate and Rhythm: Normal rate.     Pulses: Normal pulses.     Comments: 2+ radial and DP/PT pulses bilaterally, Homans sign absent bilaterally, no lower extremity edema, no palpable cords, compartments are soft  Pulmonary:     Effort: Pulmonary effort is normal.  Abdominal:     General: There is no distension.  Musculoskeletal:        General: Swelling present.     Comments: Mild swelling to the anterior aspect of the left knee.  No erythema or warmth.  No varus or valgus instability.  No ligamentous laxity.  No crepitus.  Normal active range of motion.  Tenderness to palpation overlying the ulnar aspect of the right wrist.  Mild warmth noted, no erythema or swelling.  Somewhat limited active and passive range of motion due to pain.  5/5 strength of BUE and BLE major muscle groups.  Skin:    General: Skin is warm and dry.     Findings: No erythema.  Neurological:     Mental Status: He is alert.     Comments: Fluent speech, no facial droop, sensation intact to soft touch of bilateral upper and lower extremities.  Patient ambulatory with steady gait and balance, able to heel walk and toe walk with little difficulty.  Psychiatric:        Behavior: Behavior normal.      ED Treatments / Results  Labs (all labs ordered are listed, but only abnormal results are displayed) Labs Reviewed  I-STAT CREATININE, ED    EKG None  Radiology No results found.  Procedures Procedures (including critical care time)  Medications Ordered in ED Medications  predniSONE (DELTASONE) tablet 60 mg (60 mg Oral Given 08/28/18 1119)     Initial Impression / Assessment and Plan / ED Course  I have reviewed the triage vital signs and the nursing notes.  Pertinent labs & imaging results that were available during my care of the patient were reviewed by me  and considered in my medical decision making (see chart for details).        Patient presenting for evaluation of acute on chronic left knee pain and right wrist pain.  He is afebrile, vital signs are stable.  He is nontoxic in appearance.  He is neurovascularly intact and ambulatory without difficulty despite pain.  He has a history of gout and reports this feels similar.  His knee pain has improved with elevation and Tylenol.  I-STAT creatinine shows creatinine within normal limits.  Will discharge with course of prednisone for likely gout flare.  Will avoid colchicine given risk of toxicity and other side effects.  Doubt septic arthritis in the absence of constitutional symptoms or risk factors and with known history of gout.  No evidence of DVT.  Doubt osteomyelitis.  He was given a knee sleeve and a wrist splint for comfort.  Will refer to orthopedics for follow-up.  Discussed strict ED return precautions. Patient verbalized understanding of and agreement with plan and is safe for discharge home at this time.   Final Clinical Impressions(s) / ED Diagnoses   Final diagnoses:  Acute pain of left knee  Acute pain of right wrist    ED Discharge Orders         Ordered    predniSONE (DELTASONE) 20 MG tablet  Daily with breakfast     08/28/18 [address], [place] A, PA-C 08/28/18 1223    Dorie Rank, MD 08/29/18 [phone]

## 2018-08-28 NOTE — Discharge Instructions (Addendum)
I suspect that you are having a gout flare in your right wrist.  Start taking the steroids as prescribed beginning tomorrow.  You received the first dose in the emergency department today.  Do not take any ibuprofen due to your kidney disease but you can take 1 to 2 tablets of Tylenol every 6 hours as needed for pain.  When you are not walking, elevate the extremities and apply ice to areas of soreness and pain.  I think you would benefit from follow-up with an orthopedic doctor (a bone and joint specialist).  I have attached information for the doctor that we have on call today.  Please call to make an appointment for follow-up.  You can also follow-up with your primary care doctor or ask them for a referral to an orthopedist.  Return to the emergency department if any concerning signs or symptoms develop such as fevers, severe pain, weakness to one side of the body, persistent vomiting, or loss of consciousness.

## 2018-11-19 ENCOUNTER — Other Ambulatory Visit: Payer: Self-pay | Admitting: Internal Medicine

## 2018-11-19 NOTE — Telephone Encounter (Signed)
I called [name] to inquire how he takes his colchicine.  He states he does not need a refill and did not request a refill.  He wants to come in and see [place] and get his flu shot.  I am sending to the front desk to schedule that appointment, hopefully with me.  I am also happy to see him outside of my continuity clinic when I am not in [facility].  I would rather see him myself then to have him scheduled in [facility].

## 2018-11-25 ENCOUNTER — Ambulatory Visit: Payer: Medicare HMO

## 2018-11-25 ENCOUNTER — Telehealth: Payer: Self-pay

## 2018-11-25 NOTE — Telephone Encounter (Signed)
Pt want to informed the nurse he got the flu [facility].

## 2018-11-26 NOTE — Telephone Encounter (Signed)
Flu vaccine recorded in historical immunizations. Hubbard Hartshorn, BSN, RN-BC

## 2018-11-27 ENCOUNTER — Other Ambulatory Visit: Payer: Self-pay | Admitting: *Deleted

## 2018-11-27 NOTE — Telephone Encounter (Signed)
Please ask [facility] to stop sending this request.  I documented on 10/22 but the patient does not need a refill and did not request a refill.

## 2018-12-08 NOTE — Telephone Encounter (Addendum)
Spoke with patient-he opted to get flu vaccine at local pharmacy-he his not having problems at this time and will f/u with pcp in March as scheduled.Despina Hidden Cassady11/10/20204:04 PM

## 2019-01-13 ENCOUNTER — Other Ambulatory Visit: Payer: Self-pay | Admitting: Internal Medicine

## 2019-04-08 ENCOUNTER — Ambulatory Visit: Payer: Medicare HMO | Admitting: Internal Medicine

## 2019-04-15 ENCOUNTER — Other Ambulatory Visit: Payer: Self-pay

## 2019-04-15 ENCOUNTER — Ambulatory Visit (INDEPENDENT_AMBULATORY_CARE_PROVIDER_SITE_OTHER): Payer: Medicare HMO | Admitting: Internal Medicine

## 2019-04-15 VITALS — BP 129/70 | HR 61 | Temp 98.0°F

## 2019-04-15 DIAGNOSIS — M1A3710 Chronic gout due to renal impairment, right ankle and foot, without tophus (tophi): Secondary | ICD-10-CM

## 2019-04-15 DIAGNOSIS — J4520 Mild intermittent asthma, uncomplicated: Secondary | ICD-10-CM

## 2019-04-15 DIAGNOSIS — Z7189 Other specified counseling: Secondary | ICD-10-CM

## 2019-04-15 DIAGNOSIS — Z0000 Encounter for general adult medical examination without abnormal findings: Secondary | ICD-10-CM | POA: Diagnosis not present

## 2019-04-15 DIAGNOSIS — I10 Essential (primary) hypertension: Secondary | ICD-10-CM

## 2019-04-15 DIAGNOSIS — E538 Deficiency of other specified B group vitamins: Secondary | ICD-10-CM | POA: Diagnosis not present

## 2019-04-15 MED ORDER — LISINOPRIL-HYDROCHLOROTHIAZIDE 20-12.5 MG PO TABS: 1.0000 | ORAL_TABLET | Freq: Every day | ORAL | 3 refills | Status: DC

## 2019-04-15 MED ORDER — ALBUTEROL SULFATE HFA 108 (90 BASE) MCG/ACT IN AERS: 1.0000 | INHALATION_SPRAY | Freq: Four times a day (QID) | RESPIRATORY_TRACT | 4 refills | Status: DC | PRN

## 2019-04-15 MED ORDER — CYANOCOBALAMIN 1000 MCG PO TABS: 1000.0000 ug | ORAL_TABLET | Freq: Every day | ORAL | 3 refills | Status: DC

## 2019-04-15 MED ORDER — COLCHICINE 0.6 MG PO TABS: 0.6000 mg | ORAL_TABLET | Freq: Every day | ORAL | 2 refills | Status: DC

## 2019-04-15 NOTE — Assessment & Plan Note (Signed)
[name], his son, and I discussed Covid vaccine and he plans to proceed with vaccination.  Santiago Glad states that he will check in on his father frequently in the days following his vaccinations.

## 2019-04-15 NOTE — Patient Instructions (Signed)
1. Thanks for the picture of Nannie 2. Amber or Marcie Bal will call Chrissie Noa about medical power of attorney

## 2019-04-15 NOTE — Assessment & Plan Note (Addendum)
This problem is chronic and controlled, I spent time yesterday reviewing his records and updating the overview.  He has never had a crystal diagnosis.His last flare was in the summer 2020.  He agreed to a prescription of colchicine, keeping it in a cupboard, and only using with his next gout flare.  PLAN : colchicine PRN

## 2019-04-15 NOTE — Assessment & Plan Note (Signed)
This problem is chronic and well controlled.  His most recent B12 level had increased to the 700s, up from 363.  I had gotten confused and did not think I had seen a bottle of B12 in his bag and when I tried to tell him to take B 12, he corrected me, reached into his bag, and pulled out the correct bottle of B12.  I did not do a formal Mini-Mental Status Examination but this interaction told me his mind is quite sharp.  PLAN : Cont B12

## 2019-04-15 NOTE — Progress Notes (Signed)
   Subjective:    Patient ID: [name], male    DOB: October 16, 1940, 79 y.o.   MRN: [id number]  HPI  [name] is here for HTN F/U. Please see the A&P for the status of the pt's chronic medical problems.  ROS : per ROS section and in problem oriented charting. All other systems are negative.  PMHx, Soc hx, and / or Fam hx : His goat Dell Ponto is doing well but doesn't like dogs. Son Chrissie Noa came to appt today. He is married to [place], they have 11 kids.  Review of Systems  Constitutional: Negative for fatigue and unexpected weight change.  Eyes: Negative for visual disturbance.  Respiratory: Negative for shortness of breath.   Cardiovascular: Negative for chest pain, palpitations and leg swelling.       Objective:   Physical Exam Constitutional:      General: He is not in acute distress.    Appearance: Normal appearance. He is normal weight. He is not ill-appearing, toxic-appearing or diaphoretic.  HENT:     Head: Normocephalic and atraumatic.     Right Ear: External ear normal.     Left Ear: External ear normal.  Eyes:     Extraocular Movements: Extraocular movements intact.     Conjunctiva/sclera: Conjunctivae normal.  Cardiovascular:     Rate and Rhythm: Normal rate and regular rhythm.     Heart sounds: Normal heart sounds. No murmur.  Pulmonary:     Effort: Pulmonary effort is normal. No respiratory distress.     Breath sounds: Normal breath sounds.  Abdominal:     General: Abdomen is flat.  Musculoskeletal:        General: No swelling, tenderness, deformity or signs of injury.     Right lower leg: No edema.     Left lower leg: No edema.  Skin:    General: Skin is warm and dry.  Neurological:     General: No focal deficit present.     Mental Status: He is alert. Mental status is at baseline.  Psychiatric:        Mood and Affect: Mood normal.        Behavior: Behavior normal.        Thought Content: Thought content normal.        Judgment:  Judgment normal.        Assessment & Plan:

## 2019-04-15 NOTE — Assessment & Plan Note (Signed)
Since his son, Chrissie Noa, came this time, I elected to have another conversation regarding goals of care.  Mr. Spross has not completed medical power of attorney or discussed his wishes with his family.  Mr. Engebretsen stated he wanted his son Chrissie Noa and his wife Sharl Ma to make medical decisions if he was unable to make decisions.  They had not had any discussions about possible scenarios.  Mr. Nutt discussed that his wife was unable to swallow safely at the end of her life and had a swallowing evaluation and it sounds like was living in a facility.  They agreed to have our CCM group contact Chrissie Noa to start the discussion and complete medical POA.

## 2019-04-15 NOTE — Assessment & Plan Note (Signed)
This problem is chronic and stable.  He has an albuterol inhaler to use as needed.  He states he might use 3 puffs all at once, once a week and then not need it for a while.  His lungs are clear today.  He is very active, walking, and chopping wood.  This is not interfering with his life.  PLAN:  Cont current meds

## 2019-04-15 NOTE — Assessment & Plan Note (Signed)
This problem is chronic and well controlled.  He is on lisinopril 20 and HCTZ 12.5 as a combination pill.  He has no side effects to this medication.  His blood pressure is at goal.  I will continue, check a BMP today, and I refilled his medicine for a year.  PLAN:  Cont current meds   BP Readings from Last 3 Encounters:  04/15/19 129/70  08/28/18 106/60  04/09/18 133/72

## 2019-04-16 ENCOUNTER — Encounter: Payer: Self-pay | Admitting: Internal Medicine

## 2019-04-16 LAB — BMP8+ANION GAP
Anion Gap: 14 mmol/L (ref 10.0–18.0)
BUN/Creatinine Ratio: 9 — ABNORMAL LOW (ref 10–24)
BUN: 11 mg/dL (ref 8–27)
CO2: 24 mmol/L (ref 20–29)
Calcium: 9.5 mg/dL (ref 8.6–10.2)
Chloride: 96 mmol/L (ref 96–106)
Creatinine, Ser: 1.25 mg/dL (ref 0.76–1.27)
GFR calc Af Amer: 64 mL/min/{1.73_m2} (ref >59)
GFR calc non Af Amer: 55 mL/min/{1.73_m2} — ABNORMAL LOW (ref >59)
Glucose: 82 mg/dL (ref 65–99)
Potassium: 3.9 mmol/L (ref 3.5–5.2)
Sodium: 134 mmol/L (ref 134–144)

## 2019-04-24 ENCOUNTER — Ambulatory Visit: Payer: Medicare HMO | Attending: Internal Medicine

## 2019-04-24 DIAGNOSIS — Z23 Encounter for immunization: Secondary | ICD-10-CM

## 2019-04-24 NOTE — Progress Notes (Signed)
   Covid-19 Vaccination Clinic  Name:  [name]    MRN: [id number] DOB: [birth year]  04/24/2019  [name] was observed post Covid-19 immunization for 15 minutes without incident. He was provided with Vaccine Information Sheet and instruction to access the V-Safe system.   [name] was instructed to call 911 with any severe reactions post vaccine: Marland Kitchen Difficulty breathing  . Swelling of face and throat  . A fast heartbeat  . A bad rash all over body  . Dizziness and weakness   Immunizations Administered    Name Date Dose VIS Date Route   Pfizer COVID-19 Vaccine 04/24/2019 11:12 AM 0.3 mL 01/08/2019 Intramuscular   Manufacturer: [employer]   Lot: [id number]   [place]: [id number]

## 2019-04-26 ENCOUNTER — Ambulatory Visit: Payer: Medicare HMO | Admitting: *Deleted

## 2019-04-26 ENCOUNTER — Ambulatory Visit: Payer: Medicare HMO

## 2019-04-26 DIAGNOSIS — Z86711 Personal history of pulmonary embolism: Secondary | ICD-10-CM

## 2019-04-26 DIAGNOSIS — Z7189 Other specified counseling: Secondary | ICD-10-CM

## 2019-04-26 DIAGNOSIS — I10 Essential (primary) hypertension: Secondary | ICD-10-CM

## 2019-04-26 DIAGNOSIS — J4520 Mild intermittent asthma, uncomplicated: Secondary | ICD-10-CM

## 2019-04-26 NOTE — Chronic Care Management (AMB) (Signed)
Chronic Care Management    Clinical Social Work General Note  04/26/2019 Name: [name] MRN: [id number] DOB: [birth year]  [name] is a 79 y.o. year old male who is a primary care patient of Lynnae January Real Cons, MD. The CCM was consulted to assist the patient with Advanced Directive Education.   Mr. Nordin was given information about Chronic Care Management services today including:  1. CCM service includes personalized support from designated clinical staff supervised by his physician, including individualized plan of care and coordination with other care providers 2. 24/7 contact phone numbers for assistance for urgent and routine care needs. 3. Service will only be billed when office clinical staff spend 20 minutes or more in a month to coordinate care. 4. Only one practitioner may furnish and bill the service in a calendar month. 5. The patient may stop CCM services at any time (effective at the end of the month) by phone call to the office staff. 6. The patient will be responsible for cost sharing (co-pay) of up to 20% of the service fee (after annual deductible is met).  Son verbally agreed to assistance and services provided by embedded care coordination/care management team today.  Review of patient status, including review of consultants reports, relevant laboratory and other test results, and collaboration with appropriate care team members and the patient's provider was performed as part of comprehensive patient evaluation and provision of chronic care management services.    SDOH (Social Determinants of Health) assessments and interventions performed:  No    Outpatient Encounter Medications as of 04/26/2019  Medication Sig  . albuterol (PROVENTIL HFA) 108 (90 Base) MCG/ACT inhaler Inhale 1-2 puffs into the lungs every 6 (six) hours as needed for wheezing or shortness of breath.  Marland Kitchen aspirin EC 81 MG tablet Take 81 mg by mouth daily.   . colchicine 0.6 MG tablet Take  1 tablet (0.6 mg total) by mouth daily. Take only when your gout flares  . cyanocobalamin ([facility] VITAMIN B12) 1000 MCG tablet Take 1 tablet (1,000 mcg total) by mouth daily.  Marland Kitchen lisinopril-hydrochlorothiazide (ZESTORETIC) 20-12.5 MG tablet Take 1 tablet by mouth daily.  Marland Kitchen pyridOXINE (VITAMIN B-6) 100 MG tablet Take 100 mg by mouth daily.   No facility-administered encounter medications on file as of 04/26/2019.    Goals Addressed            This Visit's Progress   . "I told my doctor that I want my son to make medical decisions for me if I can't make them myself" (pt-stated)       Current Barriers:  Marland Kitchen Knowledge Barriers related to  completion of Advance Directives.    Case Manager Clinical Goal(s):  Marland Kitchen Over the next 30 days, patient will work with BSW to address needs related to  completion of Advance Directives.   . Over the next 30 days, BSW will collaborate with RN Care Manager to address care management and care coordination needs  Interventions:  . Contacted son (as patient requested per referral) regarding assistance with completion of Advance Directive documentation  . Talked with son about [facility] POA and Living Will . Mailed Advance Directive Emmi and packet to patient.   . Encouraged son/family members to thoroughly review documentation and discuss patient's wishes for goals of care . Collaborated with RN Care Manager and patient's son to establish an individualized plan of care.     Patient Self Care Activities:  . Son verbalizes understanding of plan to review Advance  Directive documentation and discuss goals of care with patient prior to next outreach  Initial goal documentation     . Patient Stated          Follow Up Plan: Son requested follow up call on 05/19/19 to further discuss Advance Directive documentation.  Will schedule face to face visit with family/patient if requested after this call.          Ronn Melena, [place] Coordination Social  Worker [place] [phone]

## 2019-04-26 NOTE — Progress Notes (Signed)
Internal Medicine Clinic Resident   I have personally reviewed this encounter including the documentation in this note and/or discussed this patient with the care management provider. I will address any urgent items identified by the care management provider and will communicate my actions to the patient's PCP. I have reviewed the patient's CCM visit with my supervising attending.  Malvern Kadlec, MD 04/26/2019    

## 2019-04-26 NOTE — Patient Instructions (Signed)
Visit Information  Goals Addressed            This Visit's Progress   . "I told my doctor that I want my son to make medical decisions for me if I can't make them myself" (pt-stated)       Current Barriers:  Marland Kitchen Knowledge Barriers related to  completion of Advance Directives.    Case Manager Clinical Goal(s):  Marland Kitchen Over the next 30 days, patient will work with BSW to address needs related to  completion of Advance Directives.   . Over the next 30 days, BSW will collaborate with RN Care Manager to address care management and care coordination needs  Interventions:  . Contacted son (as patient requested per referral) regarding assistance with completion of Advance Directive documentation  . Talked with son about [facility] POA and Living Will . Mailed Advance Directive Emmi and packet to patient.   . Encouraged son/family members to thoroughly review documentation and discuss patient's wishes for goals of care . Collaborated with RN Care Manager and patient's son to establish an individualized plan of care.     Patient Self Care Activities:  . Son verbalizes understanding of plan to review Advance Directive documentation and discuss goals of care with patient prior to next outreach  Initial goal documentation     . Patient Stated         Mr. Gotthardt was given information about Care Management services today including:  1. Care Management services include personalized support from designated clinical staff supervised by his physician, including individualized plan of care and coordination with other care providers 2. 24/7 contact phone numbers for assistance for urgent and routine care needs. 3. The patient may stop CCM services at any time (effective at the end of the month) by phone call to the office staff.  Patient agreed to services and verbal consent obtained.   Patient verbalizes understanding of instructions provided today.   Telephone follow up appointment with care management team  member scheduled for:05/19/19   Ronn Melena, [place] Coordination Social Worker [place] [phone]

## 2019-04-26 NOTE — Progress Notes (Signed)
Internal Medicine Clinic Faculty and PCP   I have personally reviewed this encounter including the documentation in this note and/or discussed this patient with the care management provider. I will address any urgent items identified by the care management provider .  Larey Dresser, MD 04/26/2019

## 2019-04-26 NOTE — Progress Notes (Signed)
Internal Medicine Clinic Resident   I have personally reviewed this encounter including the documentation in this note and/or discussed this patient with the care management provider. I will address any urgent items identified by the care management provider and will communicate my actions to the patient's PCP. I have reviewed the patient's CCM visit with my supervising attending.  Danali Marinos, MD 04/26/2019    

## 2019-04-26 NOTE — Chronic Care Management (AMB) (Signed)
  Care Management   Initial Visit Note  04/26/2019 Name: [name] MRN: [id number] DOB: [birth year]  Subjective:   Objective:  Assessment: [name] is a 79 y.o. year old male who sees Bartholomew Crews, MD for primary care. The care management team was consulted for assistance with care management and care coordination needs related to Educational Needs,  completion of advance directives.   Review of patient status, including review of consultants reports, relevant laboratory and other test results, and collaboration with appropriate care team members and the patient's provider was performed as part of comprehensive patient evaluation and provision of care management services.    SDOH (Social Determinants of Health) assessments performed: No See Care Plan activities for detailed interventions related to [facility])     Outpatient Encounter Medications as of 04/26/2019  Medication Sig  . albuterol (PROVENTIL HFA) 108 (90 Base) MCG/ACT inhaler Inhale 1-2 puffs into the lungs every 6 (six) hours as needed for wheezing or shortness of breath.  Marland Kitchen aspirin EC 81 MG tablet Take 81 mg by mouth daily.   . colchicine 0.6 MG tablet Take 1 tablet (0.6 mg total) by mouth daily. Take only when your gout flares  . cyanocobalamin ([employer] VITAMIN B12) 1000 MCG tablet Take 1 tablet (1,000 mcg total) by mouth daily.  Marland Kitchen lisinopril-hydrochlorothiazide (ZESTORETIC) 20-12.5 MG tablet Take 1 tablet by mouth daily.  Marland Kitchen pyridOXINE (VITAMIN B-6) 100 MG tablet Take 100 mg by mouth daily.   No facility-administered encounter medications on file as of 04/26/2019.    Goals Addressed            This Visit's Progress     Patient Stated   . "I told the social worker to talk to my son about my advance directives" (pt-stated)       CARE PLAN ENTRY (see longitudinal plan of care for additional care plan information)   Current Barriers:  . Chronic Disease Management support, education, and care coordination  needs related to HTN and asthma  Case Manager Clinical Goal(s):  Marland Kitchen Over the next 30 days, patient will work with BSW to address needs related to Limited education about advance directives in patient with HTN and asthma  Interventions:  . Collaborated with BSW to initiate plan of care to address needs related to Limited education about advance directives in patient with HTN and asthma  Patient Self Care Activities:  . Patient verbalizes understanding of plan to complete advance directives . Self administers medications as prescribed . Attends all scheduled provider appointments . Calls pharmacy for medication refills  Initial goal documentation         Follow up plan:  The care management team will reach out to the patient again over the next 7 days.   Mr. Dupell was given information about Care Management services today including:  1. Care Management services include personalized support from designated clinical staff supervised by a physician, including individualized plan of care and coordination with other care providers 2. 24/7 contact phone numbers for assistance for urgent and routine care needs. 3. The patient may stop Care Management services at any time (effective at the end of the month) by phone call to the office staff.  Patient agreed to services and verbal consent obtained.  Kelli Churn RN, CCM, [facility] RN Care Manager [phone]

## 2019-04-26 NOTE — Patient Instructions (Signed)
Visit Information  Goals Addressed            This Visit's Progress     Patient Stated   . "I told the social worker to talk to my son about my advance directives" (pt-stated)       CARE PLAN ENTRY (see longitudinal plan of care for additional care plan information)   Current Barriers:  . Chronic Disease Management support, education, and care coordination needs related to HTN and asthma  Case Manager Clinical Goal(s):  Marland Kitchen Over the next 30 days, patient will work with BSW to address needs related to Limited education about advance directives in patient with HTN and asthma  Interventions:  . Collaborated with BSW to initiate plan of care to address needs related to Limited education about advance directives in patient with HTN and asthma  Patient Self Care Activities:  . Patient verbalizes understanding of plan to complete advance directives . Self administers medications as prescribed . Attends all scheduled provider appointments . Calls pharmacy for medication refills  Initial goal documentation        [name] was given information about Chronic Care Management services today including:  1. CCM service includes personalized support from designated clinical staff supervised by his physician, including individualized plan of care and coordination with other care providers 2. 24/7 contact phone numbers for assistance for urgent and routine care needs. 3. Service will only be billed when office clinical staff spend 20 minutes or more in a month to coordinate care. 4. Only one practitioner may furnish and bill the service in a calendar month. 5. The patient may stop CCM services at any time (effective at the end of the month) by phone call to the office staff. 6. The patient will be responsible for cost sharing (co-pay) of up to 20% of the service fee (after annual deductible is met).  Patient agreed to services and verbal consent obtained.   The patient verbalized  understanding of instructions provided today and declined a print copy of patient instruction materials.   The care management team will reach out to the patient again over the next 7 days.   Kelli Churn RN, CCM, [facility] RN Care Manager [phone]

## 2019-04-27 NOTE — Progress Notes (Signed)
Internal Medicine Clinic Attending  CCM services provided by the care management provider and their documentation were discussed with Dr. Melvin. We reviewed the pertinent findings, urgent action items addressed by the resident and non-urgent items to be addressed by the PCP.  I agree with the assessment, diagnosis, and plan of care documented in the CCM and resident's note.  Umberto Pavek, MD 04/27/2019  

## 2019-04-29 ENCOUNTER — Telehealth: Payer: Medicare HMO

## 2019-04-29 ENCOUNTER — Ambulatory Visit: Payer: Self-pay | Admitting: *Deleted

## 2019-04-29 DIAGNOSIS — J4520 Mild intermittent asthma, uncomplicated: Secondary | ICD-10-CM

## 2019-04-29 DIAGNOSIS — I10 Essential (primary) hypertension: Secondary | ICD-10-CM

## 2019-04-29 NOTE — Chronic Care Management (AMB) (Signed)
  Chronic Care Management   Outreach Note  04/29/2019 Name: [name] MRN: [id number] DOB: [birth year]  Referred by: Bartholomew Crews, MD Reason for referral : Chronic Care Management (HTN, Asthma)   An unsuccessful telephone outreach was attempted today. The patient was referred to the case management team for assistance with care management and care coordination. Unable to leave message on contact number as recording states voice mail box has not been set up.  Follow Up Plan: The care management team will reach out to the patient again over the next 7 days.   Kelli Churn RN, CCM, [facility] RN Care Manager [phone]

## 2019-05-04 ENCOUNTER — Ambulatory Visit: Payer: Medicare HMO | Admitting: *Deleted

## 2019-05-04 DIAGNOSIS — I10 Essential (primary) hypertension: Secondary | ICD-10-CM

## 2019-05-04 DIAGNOSIS — J4520 Mild intermittent asthma, uncomplicated: Secondary | ICD-10-CM

## 2019-05-04 NOTE — Patient Instructions (Signed)
Visit Information It was very nice speaking with you today. I am glad you are doing so well in managing your asthma, gout, arthritis and blood pressure. Please contact me should you need my help in the future with managing your chronic health issues.  Goals Addressed            This Visit's Progress     Patient Stated   . "I told the social worker to talk to my son about my advance directives" (pt-stated)       CARE PLAN ENTRY (see longitudinal plan of care for additional care plan information)   Current Barriers:  . Chronic Disease Management support, education, and care coordination needs related to HTN and asthma - spoke with patient, he states his blood pressure, asthma, gout and arthritis are in good control and does not feel he needs RNCM assistance at this time. He states he checks his blood pressure regularly at home, takes his medications as prescribed and uses his rescue inhaler prn and last used it twice last week with good relief  Case Manager Clinical Goal(s):  Marland Kitchen Over the next 30 days, patient will work with BSW to address needs related to Limited education about advance directives in patient with HTN and asthma  Interventions:  . Collaborated with BSW to initiate plan of care to address needs related to Limited education about advance directives in patient with HTN and asthma - per [employer] notes, she will discuss advance directives with son and patient on 05/19/19 . Reviewed blood pressure readings from office visits over the last year  . Assessed frequency of blood pressure self monitoring and home readings; reviewed blood pressure goals with patient . Reviewed asthma action plan with patient  Patient Self Care Activities:  . Patient verbalizes understanding of plan to complete advance directives . Self administers medications as prescribed . Attends all scheduled provider appointments . Notifies provider for abnormal blood pressure readings or shortness of  breath not relieved by rescue inhaler or gout flare . Calls pharmacy for medication refills  Please see past updates related to this goal by clicking on the "Past Updates" button in the selected goal         The patient verbalized understanding of instructions provided today and declined a print copy of patient instruction materials.   The care management team will reach out to the patient again over the next 14 days.   Kelli Churn RN, CCM, [facility] RN Care Manager [phone]

## 2019-05-04 NOTE — Chronic Care Management (AMB) (Signed)
Chronic Care Management   Follow Up Note   05/04/2019 Name: [name] MRN: [id number] DOB: 01-20-1941  Referred by: Bartholomew Crews, MD Reason for referral : Chronic Care Management (HTN, Asthma)   [name] is a 79 y.o. year old male who is a primary care patient of Bartholomew Crews, MD. The CCM team was consulted for assistance with chronic disease management and care coordination needs.    Review of patient status, including review of consultants reports, relevant laboratory and other test results, and collaboration with appropriate care team members and the patient's provider was performed as part of comprehensive patient evaluation and provision of chronic care management services.    SDOH (Social Determinants of Health) assessments performed: No See Care Plan activities for detailed interventions related to [facility])     Outpatient Encounter Medications as of 05/04/2019  Medication Sig  . albuterol (PROVENTIL HFA) 108 (90 Base) MCG/ACT inhaler Inhale 1-2 puffs into the lungs every 6 (six) hours as needed for wheezing or shortness of breath.  Marland Kitchen aspirin EC 81 MG tablet Take 81 mg by mouth daily.   . colchicine 0.6 MG tablet Take 1 tablet (0.6 mg total) by mouth daily. Take only when your gout flares  . cyanocobalamin ([employer] VITAMIN B12) 1000 MCG tablet Take 1 tablet (1,000 mcg total) by mouth daily.  Marland Kitchen FLUZONE HIGH-DOSE QUADRIVALENT 0.7 ML SUSY   . latanoprost (XALATAN) 0.005 % ophthalmic solution 1 drop at bedtime.  Marland Kitchen lisinopril-hydrochlorothiazide (ZESTORETIC) 20-12.5 MG tablet Take 1 tablet by mouth daily.  Marland Kitchen pyridOXINE (VITAMIN B-6) 100 MG tablet Take 100 mg by mouth daily.   No facility-administered encounter medications on file as of 05/04/2019.     Objective:  BP Readings from Last 3 Encounters:  04/15/19 129/70  08/28/18 106/60  04/09/18 133/72    Goals Addressed            This Visit's Progress     Patient Stated   . "I told the social worker to  talk to my son about my advance directives" (pt-stated)       CARE PLAN ENTRY (see longitudinal plan of care for additional care plan information)   Current Barriers:  . Chronic Disease Management support, education, and care coordination needs related to HTN and asthma - spoke with patient, he states his blood pressure, asthma, gout and arthritis are in good control and does not feel he needs RNCM assistance at this time. He states he checks his blood pressure regularly at home, takes his medications as prescribed and uses his rescue inhaler prn and last used it twice last week with good relief  Case Manager Clinical Goal(s):  Marland Kitchen Over the next 30 days, patient will work with BSW to address needs related to Limited education about advance directives in patient with HTN and asthma  Interventions:  . Collaborated with BSW to initiate plan of care to address needs related to Limited education about advance directives in patient with HTN and asthma - per [employer] notes, she will discuss advance directives with son and patient on 05/19/19 . Reviewed blood pressure readings from office visits over the last year  . Assessed frequency of blood pressure self monitoring and home readings; reviewed blood pressure goals with patient . Reviewed asthma action plan with patient  Patient Self Care Activities:  . Patient verbalizes understanding of plan to complete advance directives . Self administers medications as prescribed . Attends all scheduled provider appointments . Notifies provider  for abnormal blood pressure readings or shortness of breath not relieved by rescue inhaler or gout flare . Calls pharmacy for medication refills  Please see past updates related to this goal by clicking on the "Past Updates" button in the selected goal          Plan:   The care management team will reach out to the patient again over the next 30 days.    Kelli Churn RN, CCM, [facility] RN Care  Manager [phone]

## 2019-05-04 NOTE — Progress Notes (Signed)
Internal Medicine Clinic Faculty and PCP   I have personally reviewed this encounter including the documentation in this note and/or discussed this patient with the care management provider. I will address any urgent items identified by the care management provider. I did not discuss with Dr Koleen Distance but reviewed her attestation.  Larey Dresser, MD 05/04/2019

## 2019-05-04 NOTE — Progress Notes (Signed)
Internal Medicine Clinic Resident   I have personally reviewed this encounter including the documentation in this note and/or discussed this patient with the care management provider. I will address any urgent items identified by the care management provider and will communicate my actions to the patient's PCP. I have reviewed the patient's CCM visit with my supervising attending.  Delice Bison, DO 05/04/2019

## 2019-05-19 ENCOUNTER — Ambulatory Visit: Payer: Medicare HMO | Attending: Internal Medicine

## 2019-05-19 ENCOUNTER — Ambulatory Visit: Payer: Self-pay

## 2019-05-19 ENCOUNTER — Telehealth: Payer: Medicare HMO

## 2019-05-19 DIAGNOSIS — Z23 Encounter for immunization: Secondary | ICD-10-CM

## 2019-05-19 NOTE — Progress Notes (Signed)
   Covid-19 Vaccination Clinic  Name:  [name]    MRN: [id number] DOB: [birth year]  05/19/2019  [name] was observed post Covid-19 immunization for 15 minutes without incident. He was provided with Vaccine Information Sheet and instruction to access the V-Safe system.   [name] was instructed to call 911 with any severe reactions post vaccine: Marland Kitchen Difficulty breathing  . Swelling of face and throat  . A fast heartbeat  . A bad rash all over body  . Dizziness and weakness   Immunizations Administered    Name Date Dose VIS Date Route   Pfizer COVID-19 Vaccine 05/19/2019 10:50 AM 0.3 mL 03/24/2018 Intramuscular   Manufacturer: [place]   Lot: [id number]   [place]: [id number]

## 2019-05-19 NOTE — Chronic Care Management (AMB) (Signed)
  Chronic Care Management   Outreach Note  05/19/2019 Name: [name] MRN: [id number] DOB: May 31, 1940  Referred by: Bartholomew Crews, MD Reason for referral : Care Coordination (Advance Directives )   An unsuccessful telephone outreach was attempted today. The patient was referred to the case management team for assistance with care management and care coordination.   Follow Up Plan: A HIPPA compliant phone message was left for the patient as well as his son, [name], providing contact information and requesting a return call.        Ronn Melena, [place] Coordination Social Worker [place] [phone]

## 2019-05-21 ENCOUNTER — Ambulatory Visit (INDEPENDENT_AMBULATORY_CARE_PROVIDER_SITE_OTHER): Payer: Medicare HMO | Admitting: Internal Medicine

## 2019-05-21 ENCOUNTER — Other Ambulatory Visit: Payer: Self-pay

## 2019-05-21 ENCOUNTER — Encounter: Payer: Self-pay | Admitting: Internal Medicine

## 2019-05-21 VITALS — BP 118/57 | HR 66 | Temp 98.0°F | Ht 71.0 in | Wt 196.8 lb

## 2019-05-21 DIAGNOSIS — M25462 Effusion, left knee: Secondary | ICD-10-CM | POA: Insufficient documentation

## 2019-05-21 MED ORDER — DICLOFENAC SODIUM 1 % EX GEL: 2.0000 g | Freq: Four times a day (QID) | CUTANEOUS | 3 refills | Status: DC

## 2019-05-21 NOTE — Progress Notes (Signed)
Internal Medicine Clinic Attending  I saw and evaluated the patient.  I personally confirmed the key portions of the history and exam documented by Dr. Gilford Rile and I reviewed pertinent patient test results.  The assessment, diagnosis, and plan were formulated together and I agree with the documentation in the resident's note.   I was present for and supervised the procedure.

## 2019-05-21 NOTE — Assessment & Plan Note (Signed)
Patient presents with a large L knee joint effusion. Arthrocentesis was performed with 120 cc of clear fluid removal. See procedure note.

## 2019-05-21 NOTE — Patient Instructions (Signed)
Mr. Camp,  It was a pleasure meeting you today! Today we addressed your knee pain by aspirating the fluid. We will send your fluid into the lab for analysis. In the meantime please use the cream for pain and apply as needed. We will see you in 3 months time!  Sincerely,  Maudie Mercury, MD

## 2019-05-21 NOTE — Progress Notes (Addendum)
CC: Knee Pain  HPI:  [name] is a 79 y.o. male, with a PMH noted below, who presents to the [facility] with complaints of knee pain. To see the management of his acute and chronic conditions, please refer to the A&P note under the encounters tab.   Past Medical History:  Diagnosis Date  . Aortic root dilatation ([place]) 2012   Seen on Echo in 2012.  3mm  . Asthma, chronic 07/13/2013   PFT's 05/2012 : FEV1 / FVC 75 and 78 pre and post BD. FEV1 74 and 75 pre and post BD. TLC 72. RV 93. DLCO 79. Read as restrictive lung disease, possible with obstructive lung disease. Prior smoker.    Marland Kitchen BPH (benign prostatic hyperplasia)   . Colitis    3-4 ulcers on colonoscopy in 12/05  . Depression   . Diverticulitis    colonoscopy in 12/05  . Gastritis   . History of hypercholesterolemia   . HTN (hypertension)   . Hypercholesterolemia   . Osteoarthritis    Primarily left knee, acute right knee swelling in 9/07  . Other chest pain    Adenosine myoview in 2004 showing EF 52% with no evidence for ischemia.  There was a basal inferior fixed defect that my have been artifact.  . PE (pulmonary thromboembolism) ([place]) 09/13/2006   In 2008. On Warfarin until 01/2010 when he was admitted for syncope and noticed that he had completed course of warfarin and it was stopped.   . Syncope    Suspect neurocardiogenic.  Always with standing or micturation.  Had bradycardic/hypotensive response with standing when hospitalized in 1/12.  On Florinef.  Echo (1/12): EF 60-65%, mild aortic root dilation.   . Wrist disorder    Proximal carpo-scapholunate dissosiation -seen by Dr. Derryl Harbor   Review of Systems:   Review of Systems  Constitutional: Negative for chills, fever, malaise/fatigue and weight loss.  Gastrointestinal: Negative for abdominal pain, constipation, diarrhea, nausea and vomiting.  Musculoskeletal: Positive for joint pain. Negative for back pain, falls and neck pain.       L knee pain    Endo/Heme/Allergies: Does not bruise/bleed easily.    Physical Exam:  Vitals:   05/21/19 1002  BP: (!) 118/57  Pulse: 66  Temp: 98 F (36.7 C)  TempSrc: Oral  SpO2: 99%  Weight: 196 lb 12.8 oz (89.3 kg)   Physical Exam Constitutional:      Appearance: Normal appearance. He is normal weight.  HENT:     Head: Normocephalic and atraumatic.  Cardiovascular:     Rate and Rhythm: Normal rate and regular rhythm.     Pulses: Normal pulses.     Heart sounds: No murmur. No friction rub. No gallop.   Musculoskeletal:        General: Swelling present. No tenderness.     Right lower leg: No edema.     Left lower leg: No edema.     Comments: Medial joint line tenderness, with large erythematous effusion located at the medial suprapatellar region.   Skin:    General: Skin is warm.  Neurological:     Mental Status: He is alert and oriented to person, place, and time.  Psychiatric:        Mood and Affect: Mood normal.        Behavior: Behavior normal.    Knee Arthrocentesis Procedure Note  Diagnosis: left Knee  Indications: Symptomatic relief of large effusion  Anesthesia: Lidocaine 1% without epinephrine  Procedure Details: Point  of care ultrasound was used to identify the joint effusion and plan needle trajectory and depth. Consent was obtained for the procedure. The joint was prepped with Betadine. A 21 gauge needle was inserted into the superior aspect of the joint from a medial approach to access the suprapatellar pouch. 120 ml of clear yellow fluid was removed from the joint and sent to the lab for analysis. The needle was removed and the area cleansed and dressed.  Complications:  None; patient tolerated the procedure well. Assessment & Plan:   See Encounters Tab for problem based charting.  Patient discussed with Dr. Philipp Ovens

## 2019-05-22 LAB — SYNOVIAL CELL COUNT + DIFF, W/ CRYSTALS
Crystals, Fluid: NONE SEEN
Eosinophils-Synovial: 2 % — ABNORMAL HIGH (ref 0–1)
Lymphocytes-Synovial Fld: 68 % — ABNORMAL HIGH (ref 0–20)
Monocyte-Macrophage-Synovial Fluid: 4 % — ABNORMAL LOW (ref 50–90)
Neutrophil, Synovial: 26 % — ABNORMAL HIGH (ref 0–25)
WBC, Synovial: 4055 /mm3 — ABNORMAL HIGH (ref 0–200)

## 2019-05-24 LAB — BODY FLUID CULTURE: Culture: NO GROWTH

## 2019-05-28 ENCOUNTER — Telehealth: Payer: Medicare HMO

## 2019-05-28 ENCOUNTER — Ambulatory Visit: Payer: Self-pay

## 2019-05-28 NOTE — Chronic Care Management (AMB) (Signed)
  Chronic Care Management   Outreach Note  05/28/2019 Name: [name] MRN: [id number] DOB: 07-Dec-1940  Referred by: Bartholomew Crews, MD Reason for referral : Care Coordination (Advance Directives )   A second unsuccessful telephone outreach was attempted today. The patient was referred to the case management team for assistance with care management and care coordination.   Follow Up Plan: A HIPPA compliant phone message was left for the patient's son providing contact information and requesting a return call. Unable to leave message on patient's number due to mailbox being full.        Ronn Melena, [place] Coordination Social Worker [place] [phone]

## 2019-06-03 ENCOUNTER — Ambulatory Visit: Payer: Medicare HMO

## 2019-06-03 NOTE — Chronic Care Management (AMB) (Signed)
  SIGNATURE Chronic Care Management   Outreach Note  06/03/2019 Name: [name] MRN: [id number] DOB: March 20, 1940  Referred by: Bartholomew Crews, MD Reason for referral : Care Coordination (Advance Directives)   Successful outreach to patient today but he requested contacting daughter, Mardene Celeste or son, Chrissie Noa.  Left third message for son and first message for daughter.   Will close case to CCM services if no return call within the next week.       Ronn Melena, [place] Coordination Social Worker [place] [phone]

## 2019-06-11 ENCOUNTER — Ambulatory Visit: Payer: Self-pay

## 2019-06-11 NOTE — Chronic Care Management (AMB) (Signed)
Care Management   Follow Up Note   06/11/2019 Name: [name] MRN: [id number] DOB: [birth year]  Referred by: Bartholomew Crews, MD Reason for referral : Care Coordination   [name] is a 79 y.o. year old male who is a primary care patient of Bartholomew Crews, MD. The care management team was consulted for assistance with care management and care coordination needs.    Review of patient status, including review of consultants reports, relevant laboratory and other test results, and collaboration with appropriate care team members and the patient's provider was performed as part of comprehensive patient evaluation and provision of chronic care management services.    SDOH (Social Determinants of Health) assessments performed: No See Care Plan activities for detailed interventions related to [facility])     Advanced Directives: See Care Plan and Vynca application for related entries.   Goals Addressed            This Visit's Progress   . COMPLETED: "I told my doctor that I want my son to make medical decisions for me if I can't make them myself" (pt-stated)       Current Barriers:  Marland Kitchen Knowledge Barriers related to  completion of Advance Directives.    Case Manager Clinical Goal(s):  Marland Kitchen Over the next 30 days, patient will work with BSW to address needs related to  completion of Advance Directives.   . Over the next 30 days, BSW will collaborate with RN Care Manager to address care management and care coordination needs  Interventions:  . Unable to pursue further intervention for this care plan activity secondary to inability to maintain contact with the patient  Patient Self Care Activities:  . Son verbalizes understanding of plan to review Advance Directive documentation and discuss goals of care with patient prior to next outreach  Initial goal documentation     . COMPLETED: "I told the social worker to talk to my son about my advance directives" (pt-stated)        CARE PLAN ENTRY (see longitudinal plan of care for additional care plan information)   Current Barriers:  . Chronic Disease Management support, education, and care coordination needs related to HTN and asthma - spoke with patient, he states his blood pressure, asthma, gout and arthritis are in good control and does not feel he needs RNCM assistance at this time. He states he checks his blood pressure regularly at home, takes his medications as prescribed and uses his rescue inhaler prn and last used it twice last week with good relief  Case Manager Clinical Goal(s):  Marland Kitchen Over the next 30 days, patient will work with BSW to address needs related to Limited education about advance directives in patient with HTN and asthma  Interventions:   . Unable to pursue further intervention for this care plan activity secondary to inability to maintain contact with the patient",  . Patient Self Care Activities:  . Patient verbalizes understanding of plan to complete advance directives . Self administers medications as prescribed . Attends all scheduled provider appointments . Notifies provider for abnormal blood pressure readings or shortness of breath not relieved by rescue inhaler or gout flare . Calls pharmacy for medication refills  Please see past updates related to this goal by clicking on the "Past Updates" button in the selected goal          The care management team is available to follow up with the patient after provider conversation with the patient regarding recommendation  for care management engagement and subsequent re-referral to the care management team.     Ronn Melena, [place] Worker [place] [phone]

## 2019-06-14 NOTE — Progress Notes (Signed)
Internal Medicine Clinic Resident  I have personally reviewed this encounter including the documentation in this note and/or discussed this patient with the care management provider. I will address any urgent items identified by the care management provider and will communicate my actions to the patient's PCP. I have reviewed the patient's CCM visit with my supervising attending, Dr Raines.  Joshua K Lee, MD 06/14/2019   

## 2019-07-22 ENCOUNTER — Encounter: Payer: Self-pay | Admitting: *Deleted

## 2019-11-09 ENCOUNTER — Ambulatory Visit: Payer: Self-pay

## 2019-11-09 NOTE — Chronic Care Management (AMB) (Signed)
Encounter opened in error.   Ronn Melena, [place] Coordination Social Worker [place] [phone]

## 2019-11-18 ENCOUNTER — Telehealth: Payer: Self-pay | Admitting: *Deleted

## 2019-12-10 ENCOUNTER — Telehealth: Payer: Self-pay | Admitting: *Deleted

## 2019-12-10 DIAGNOSIS — M1A3710 Chronic gout due to renal impairment, right ankle and foot, without tophus (tophi): Secondary | ICD-10-CM

## 2019-12-10 MED ORDER — COLCHICINE 0.6 MG PO CAPS: 0.6000 mg | ORAL_CAPSULE | Freq: Every day | ORAL | 6 refills | Status: DC

## 2019-12-10 NOTE — Telephone Encounter (Signed)
Received fax from [place] which states insurance formulary has changed for Colchicine; please send new rx for brand name Mitigare capsule. Thanks

## 2019-12-10 NOTE — Telephone Encounter (Signed)
Completed as requested

## 2020-01-07 ENCOUNTER — Encounter: Payer: Self-pay | Admitting: *Deleted

## 2020-01-07 NOTE — Progress Notes (Unsigned)

## 2020-01-15 ENCOUNTER — Ambulatory Visit: Payer: Medicare HMO | Attending: Internal Medicine

## 2020-01-15 ENCOUNTER — Ambulatory Visit: Payer: Self-pay

## 2020-01-15 DIAGNOSIS — Z23 Encounter for immunization: Secondary | ICD-10-CM

## 2020-01-15 NOTE — Progress Notes (Signed)
   Covid-19 Vaccination Clinic  Name:  TIRAN SAUSEDA    MRN: [id number] DOB: 09-07-1940  01/15/2020  Mr. Brosseau was observed post Covid-19 immunization for 15 minutes without incident. He was provided with Vaccine Information Sheet and instruction to access the V-Safe system.   Mr. Euceda was instructed to call 911 with any severe reactions post vaccine: Marland Kitchen Difficulty breathing  . Swelling of face and throat  . A fast heartbeat  . A bad rash all over body  . Dizziness and weakness   Immunizations Administered    Name Date Dose VIS Date Route   Pfizer COVID-19 Vaccine 01/15/2020 12:21 PM 0.3 mL 11/17/2019 Intramuscular   Manufacturer: [place]   Lot: [id number]   [place]: [id number]

## 2020-02-03 ENCOUNTER — Encounter: Payer: Self-pay | Admitting: Internal Medicine

## 2020-02-07 NOTE — Progress Notes (Unsigned)
Things That May Be Affecting Your Health:  Alcohol  Hearing loss  Pain    Depression  Home Safety  Sexual Health   Diabetes  Lack of physical activity  Stress   Difficulty with daily activities  Loneliness  Tiredness   Drug use  Medicines  Tobacco use   Falls  Motor Vehicle Safety  Weight   Food choices  Oral Health  Other    YOUR PERSONALIZED HEALTH PLAN : 1. Schedule your next subsequent Medicare Wellness visit in one year 2. Attend all of your regular appointments to address your medical issues 3. Complete the preventative screenings and services   Annual Wellness Visit   Medicare Covered Preventative Screenings and [place] Men and Women Who How Often Need? Date of Last Service Action  Abdominal Aortic Aneurysm Adults with AAA risk factors Once     Alcohol Misuse and Counseling All Adults Screening once a year if no alcohol misuse. Counseling up to 4 face to face sessions.     Bone Density Measurement  Adults at risk for osteoporosis Once every 2 yrs     Lipid Panel Z13.6 All adults without CV disease Once every 5 yrs     Colorectal Cancer   Stool sample or  Colonoscopy All adults 31 and older   Once every year  Every 10 years     Depression All Adults Once a year  Today   Diabetes Screening Blood glucose, post glucose load, or GTT Z13.1  All adults at risk  Pre-diabetics  Once per year  Twice per year     Diabetes  Self-Management Training All adults Diabetics 10 hrs first year; 2 hours subsequent years. Requires Copay     Glaucoma  Diabetics  Family history of glaucoma  African Americans 105 yrs +  Hispanic Americans 56 yrs + Annually - requires coppay     Hepatitis C Z72.89 or F19.20  High Risk for HCV  Born between 1945 and 1965  Annually  Once     HIV Z11.4 All adults based on risk  Annually btw ages 38 & 71 regardless of risk  Annually > 65 yrs if at increased risk     Lung Cancer Screening Asymptomatic adults aged  37-77 with 30 pack yr history and current smoker OR quit within the last 15 yrs Annually Must have counseling and shared decision making documentation before first screen     Medical Nutrition Therapy Adults with   Diabetes  Renal disease  Kidney transplant within past 3 yrs 3 hours first year; 2 hours subsequent years     Obesity and Counseling All adults Screening once a year Counseling if BMI 30 or higher  Today   Tobacco Use Counseling Adults who use tobacco  Up to 8 visits in one year     Vaccines Z23  Hepatitis B  Influenza   Pneumonia  Adults   Once  Once every flu season  Two different vaccines separated by one year Needs TDaP    Next Annual Wellness Visit People with Medicare Every year  Today     Services & Screenings Women Who How Often Need  Date of Last Service Action  Mammogram  Z12.31 Women over 76 One baseline ages 84-39. Annually ager 40 yrs+     Pap tests All women Annually if high risk. Every 2 yrs for normal risk women     Screening for cervical cancer with   Pap (Z01.419 nl or Z01.411abnl) &  HPV Z11.51 Women aged 51 to 43 Once every 5 yrs     Screening pelvic and breast exams All women Annually if high risk. Every 2 yrs for normal risk women     Sexually Transmitted Diseases  Chlamydia  Gonorrhea  Syphilis All at risk adults Annually for non pregnant females at increased risk         [place] Men Who How Ofter Need  Date of Last Service Action  Prostate Cancer - DRE & PSA Men over 50 Annually.  DRE might require a copay.     Sexually Transmitted Diseases  Syphilis All at risk adults Annually for men at increased risk

## 2020-03-21 ENCOUNTER — Other Ambulatory Visit: Payer: Self-pay

## 2020-03-22 MED ORDER — LISINOPRIL-HYDROCHLOROTHIAZIDE 20-12.5 MG PO TABS: 1.0000 | ORAL_TABLET | Freq: Every day | ORAL | 3 refills | Status: DC

## 2020-06-01 ENCOUNTER — Encounter: Payer: Self-pay | Admitting: Internal Medicine

## 2020-06-01 ENCOUNTER — Ambulatory Visit (INDEPENDENT_AMBULATORY_CARE_PROVIDER_SITE_OTHER): Payer: Medicare HMO | Admitting: Internal Medicine

## 2020-06-01 VITALS — BP 129/76 | HR 68 | Temp 98.2°F | Ht 71.0 in | Wt 192.2 lb

## 2020-06-01 DIAGNOSIS — Z0000 Encounter for general adult medical examination without abnormal findings: Secondary | ICD-10-CM | POA: Diagnosis not present

## 2020-06-01 DIAGNOSIS — H409 Unspecified glaucoma: Secondary | ICD-10-CM

## 2020-06-01 DIAGNOSIS — J4520 Mild intermittent asthma, uncomplicated: Secondary | ICD-10-CM

## 2020-06-01 DIAGNOSIS — H9193 Unspecified hearing loss, bilateral: Secondary | ICD-10-CM | POA: Insufficient documentation

## 2020-06-01 DIAGNOSIS — Z23 Encounter for immunization: Secondary | ICD-10-CM

## 2020-06-01 DIAGNOSIS — N400 Enlarged prostate without lower urinary tract symptoms: Secondary | ICD-10-CM

## 2020-06-01 DIAGNOSIS — M170 Bilateral primary osteoarthritis of knee: Secondary | ICD-10-CM

## 2020-06-01 DIAGNOSIS — E785 Hyperlipidemia, unspecified: Secondary | ICD-10-CM | POA: Diagnosis not present

## 2020-06-01 DIAGNOSIS — M1A3710 Chronic gout due to renal impairment, right ankle and foot, without tophus (tophi): Secondary | ICD-10-CM | POA: Diagnosis not present

## 2020-06-01 DIAGNOSIS — I10 Essential (primary) hypertension: Secondary | ICD-10-CM | POA: Diagnosis not present

## 2020-06-01 DIAGNOSIS — E538 Deficiency of other specified B group vitamins: Secondary | ICD-10-CM | POA: Diagnosis not present

## 2020-06-01 DIAGNOSIS — Z7189 Other specified counseling: Secondary | ICD-10-CM

## 2020-06-01 MED ORDER — COLCHICINE 0.6 MG PO TABS: 0.6000 mg | ORAL_TABLET | Freq: Every day | ORAL | 3 refills | Status: DC

## 2020-06-01 MED ORDER — ALBUTEROL SULFATE HFA 108 (90 BASE) MCG/ACT IN AERS: 1.0000 | INHALATION_SPRAY | Freq: Four times a day (QID) | RESPIRATORY_TRACT | 4 refills | Status: DC | PRN

## 2020-06-01 MED ORDER — LISINOPRIL-HYDROCHLOROTHIAZIDE 20-12.5 MG PO TABS
1.0000 | ORAL_TABLET | Freq: Every day | ORAL | 3 refills | Status: DC
Start: 2020-06-01 — End: 2021-07-19

## 2020-06-01 NOTE — Progress Notes (Addendum)
This is a Careers information officer Note.  The care of the patient was discussed with Dr. Jimmye Norman and the assessment and plan was formulated with their assistance.    Subjective:   Patient ID: [name] male   DOB: 06/02/1940 80 y.o.   MRN: [id number]  HPI: Here for routine f/u, [name] is a very active 80 y.o. widower who lives independently in his home with close support of a large network of family and friends.  His last visit was a year ago - an interim visit was cancelled due to scheduling conflict. Accompanied today by his son.  [name] is in a bright mood and has no complaints.  He is independent without difficulty with ADLs.  Maintains independence in IADLs as well, notably including mowing his own lawn, driving, managing his medications and personal business, and gardening.  He has had no falls.  Endorses no difficulty chewing or swallowing.  No problems with appetite, weight change, elimination, or sleep.  No SHOB, chest discomfort, or abdominal discomfort.  No functionally limiting joint discomfort. No concerns about memory.  "I'm doing fine".  Reviewing chronic issues:  Gout: Takes colchicine for flares of this clinical (not crystal) diagnosis, with no attacks "in a long time".  EOL decision making: Process was begun some time ago - he now has paperwork completed and with granddaughter, not filed but he is not concerned "someone in my family will take care of me"  B12 deficiency: Continues daily B12 supplement and requests RF (though this is OTC).  Mild intermittent asthma per documentation- albuterol MDI (requests RF) used very infrequently- sometimes goes 3 months without needing it  HTN: Taking lisinopril 20, HCTZ 12.5 as prescribed. Occasional brief self-limited dizziness upon standing.    Glaucoma: F/u needed with eye professional -  He endorses some decline in vision which he attributes to needing new prescription lenses. He is currently wearing  glasses.  HLD: due for recheck  Current Outpatient Medications:  .  colchicine 0.6 MG tablet, Take 1 tablet (0.6 mg total) by mouth daily., Disp: 90 tablet, Rfl: 3 .  albuterol (PROVENTIL HFA) 108 (90 Base) MCG/ACT inhaler, Inhale 1-2 puffs into the lungs every 6 (six) hours as needed for wheezing or shortness of breath., Disp: 18 g, Rfl: 4 .  aspirin EC 81 MG tablet, Take 81 mg by mouth daily. , Disp: , Rfl:  .   latanoprost (XALATAN) 0.005 % ophthalmic solution, 1 drop at bedtime., Disp: , Rfl:  .  lisinopril-hydrochlorothiazide (ZESTORETIC) 20-12.5 MG tablet, Take 1 tablet by mouth daily., Disp: 90 tablet, Rfl: 3 .  ondansetron (ZOFRAN ODT) 8 MG disintegrating tablet, Take 1 tablet (8 mg total) by mouth every 8 (eight) hours as needed. 8mg  ODT q4 hours prn nausea, Disp: 30 tablet, Rfl: 0  BP 129/76 (BP Location: Right Arm, Cuff Size: Small)   Pulse 68   Temp 98.2 F (36.8 C) (Oral)   Ht 5\' 11"  (1.803 m)   Wt 192 lb 3.2 oz (87.2 kg)   SpO2 100%   BMI 26.81 kg/m    Tall, slender man nicely dressed and groomed, friendly persona.  Decreased hearing at conversational volume, canals patent w/o cerumen accumulation, TMs nml.  OP moist, dentition in good condition though teeth are worn with height loss.  Lungs clear to bases.  Heart RRR w/o murmur.  Abd flat.  LE's no edema, dp's 2+ symmetric.  FEet warm,  in good repair with trimmed nails, no wounds.  Hallux valgus bilat with secondary toe crowding.  No calluses or pressure injury.  Good footwear.  Walks with stable gait no assistive device.     Assessment & Plan:  Please see problem based charting for more details  [name] is doing very well functionally and medically, stable without decline, and he has an excellent support system.  Gout and asthma symptoms are ver infrequent and no conditions are functionally limiting.  New finding today of hearing impairment which he acknowledges and doesn't wish to pursue.  His dentition is in  excellent condition and he will be following up with his dentist for routine care, as well as with his eye professional.   BMP today to monitor renal fxn and electrolytes on antihypertensives, B12 to determine if ongoing supps needed, lipids as they were elevated when last checked 2016. Prevention:  Covid vaccinated and boosted x 1.  Due for TDaP, offered and accepted to receive today. Next visit - check bmp,B12, D

## 2020-06-01 NOTE — Patient Instructions (Signed)
[name], It was great to catch up with you today and to hear that you are doing so well.  Today we updated your tetanus shot, and we are checking blood work to evaluate your kidney function, electrolytes, cholesterol, and B12 level.  I will call you with any abnormalities.  You may stop your B12 now and I'll recheck it in 36M.  TAke care and stay well!  Dr. Jimmye Norman

## 2020-06-02 ENCOUNTER — Emergency Department (HOSPITAL_COMMUNITY)
Admission: EM | Admit: 2020-06-02 | Discharge: 2020-06-03 | Disposition: A | Discharge status: Home / Self Care | Payer: Medicare HMO | Attending: Emergency Medicine | Admitting: Emergency Medicine

## 2020-06-02 ENCOUNTER — Emergency Department (HOSPITAL_COMMUNITY): Payer: Medicare HMO

## 2020-06-02 ENCOUNTER — Encounter (HOSPITAL_COMMUNITY): Payer: Self-pay | Admitting: Emergency Medicine

## 2020-06-02 ENCOUNTER — Other Ambulatory Visit: Payer: Self-pay

## 2020-06-02 DIAGNOSIS — R42 Dizziness and giddiness: Secondary | ICD-10-CM | POA: Insufficient documentation

## 2020-06-02 DIAGNOSIS — L539 Erythematous condition, unspecified: Secondary | ICD-10-CM | POA: Diagnosis not present

## 2020-06-02 DIAGNOSIS — R21 Rash and other nonspecific skin eruption: Secondary | ICD-10-CM | POA: Diagnosis not present

## 2020-06-02 DIAGNOSIS — M79602 Pain in left arm: Secondary | ICD-10-CM | POA: Insufficient documentation

## 2020-06-02 DIAGNOSIS — T50Z95A Adverse effect of other vaccines and biological substances, initial encounter: Secondary | ICD-10-CM | POA: Insufficient documentation

## 2020-06-02 DIAGNOSIS — R110 Nausea: Secondary | ICD-10-CM | POA: Insufficient documentation

## 2020-06-02 DIAGNOSIS — Z79899 Other long term (current) drug therapy: Secondary | ICD-10-CM | POA: Insufficient documentation

## 2020-06-02 DIAGNOSIS — Z7982 Long term (current) use of aspirin: Secondary | ICD-10-CM | POA: Insufficient documentation

## 2020-06-02 DIAGNOSIS — Z20822 Contact with and (suspected) exposure to covid-19: Secondary | ICD-10-CM | POA: Diagnosis not present

## 2020-06-02 DIAGNOSIS — Z87891 Personal history of nicotine dependence: Secondary | ICD-10-CM | POA: Insufficient documentation

## 2020-06-02 DIAGNOSIS — J45909 Unspecified asthma, uncomplicated: Secondary | ICD-10-CM | POA: Diagnosis not present

## 2020-06-02 DIAGNOSIS — X58XXXA Exposure to other specified factors, initial encounter: Secondary | ICD-10-CM | POA: Diagnosis not present

## 2020-06-02 DIAGNOSIS — I10 Essential (primary) hypertension: Secondary | ICD-10-CM | POA: Diagnosis not present

## 2020-06-02 DIAGNOSIS — R509 Fever, unspecified: Secondary | ICD-10-CM | POA: Diagnosis not present

## 2020-06-02 DIAGNOSIS — R059 Cough, unspecified: Secondary | ICD-10-CM | POA: Diagnosis not present

## 2020-06-02 LAB — CBC WITH DIFFERENTIAL/PLATELET
Abs Immature Granulocytes: 0.02 10*3/uL (ref 0.00–0.07)
Basophils Absolute: 0 10*3/uL (ref 0.0–0.1)
Basophils Relative: 0 %
Eosinophils Absolute: 0.2 10*3/uL (ref 0.0–0.5)
Eosinophils Relative: 3 %
HCT: 39.7 % (ref 39.0–52.0)
Hemoglobin: 13.5 g/dL (ref 13.0–17.0)
Immature Granulocytes: 0 %
Lymphocytes Relative: 31 %
Lymphs Abs: 2 10*3/uL (ref 0.7–4.0)
MCH: 28.2 pg (ref 26.0–34.0)
MCHC: 34 g/dL (ref 30.0–36.0)
MCV: 82.9 fL (ref 80.0–100.0)
Monocytes Absolute: 0.5 10*3/uL (ref 0.1–1.0)
Monocytes Relative: 8 %
Neutro Abs: 3.7 10*3/uL (ref 1.7–7.7)
Neutrophils Relative %: 58 %
Platelets: 249 10*3/uL (ref 150–400)
RBC: 4.79 MIL/uL (ref 4.22–5.81)
RDW: 14.1 % (ref 11.5–15.5)
WBC: 6.4 10*3/uL (ref 4.0–10.5)
nRBC: 0 % (ref 0.0–0.2)

## 2020-06-02 LAB — COMPREHENSIVE METABOLIC PANEL
ALT: 11 U/L (ref 0–44)
AST: 22 U/L (ref 15–41)
Albumin: 4 g/dL (ref 3.5–5.0)
Alkaline Phosphatase: 42 U/L (ref 38–126)
Anion gap: 6 (ref 5–15)
BUN: 17 mg/dL (ref 8–23)
CO2: 25 mmol/L (ref 22–32)
Calcium: 9.1 mg/dL (ref 8.9–10.3)
Chloride: 100 mmol/L (ref 98–111)
Creatinine, Ser: 1.4 mg/dL — ABNORMAL HIGH (ref 0.61–1.24)
GFR, Estimated: 51 mL/min — ABNORMAL LOW (ref >60)
Glucose, Bld: 97 mg/dL (ref 70–99)
Potassium: 3.6 mmol/L (ref 3.5–5.1)
Sodium: 131 mmol/L — ABNORMAL LOW (ref 135–145)
Total Bilirubin: 1 mg/dL (ref 0.3–1.2)
Total Protein: 7.1 g/dL (ref 6.5–8.1)

## 2020-06-02 LAB — BMP8+ANION GAP
Anion Gap: 18 mmol/L (ref 10.0–18.0)
BUN/Creatinine Ratio: 14 (ref 10–24)
BUN: 21 mg/dL (ref 8–27)
CO2: 22 mmol/L (ref 20–29)
Calcium: 9.3 mg/dL (ref 8.6–10.2)
Chloride: 94 mmol/L — ABNORMAL LOW (ref 96–106)
Creatinine, Ser: 1.54 mg/dL — ABNORMAL HIGH (ref 0.76–1.27)
Glucose: 87 mg/dL (ref 65–99)
Potassium: 3.8 mmol/L (ref 3.5–5.2)
Sodium: 134 mmol/L (ref 134–144)
eGFR: 46 mL/min/{1.73_m2} — ABNORMAL LOW (ref >59)

## 2020-06-02 LAB — LIPID PANEL
Chol/HDL Ratio: 4.8 ratio (ref 0.0–5.0)
Cholesterol, Total: 163 mg/dL (ref 100–199)
HDL: 34 mg/dL — ABNORMAL LOW (ref >39)
LDL Chol Calc (NIH): 106 mg/dL — ABNORMAL HIGH (ref 0–99)
Triglycerides: 124 mg/dL (ref 0–149)
VLDL Cholesterol Cal: 23 mg/dL (ref 5–40)

## 2020-06-02 LAB — URINALYSIS, ROUTINE W REFLEX MICROSCOPIC
Bacteria, UA: NONE SEEN
Bilirubin Urine: NEGATIVE
Glucose, UA: NEGATIVE mg/dL
Ketones, ur: NEGATIVE mg/dL
Leukocytes,Ua: NEGATIVE
Nitrite: NEGATIVE
Protein, ur: NEGATIVE mg/dL
Specific Gravity, Urine: 1.015 (ref 1.005–1.030)
pH: 5 (ref 5.0–8.0)

## 2020-06-02 LAB — VITAMIN B12: Vitamin B-12: 778 pg/mL (ref 232–1245)

## 2020-06-02 NOTE — ED Triage Notes (Signed)
Patient reports nausea , dizziness and " feeling hot" /fatigue today .

## 2020-06-02 NOTE — ED Provider Notes (Signed)
Emergency Medicine Provider Triage Evaluation Note  [name] , a 80 y.o. male  was evaluated in triage.  Pt complains of fever.  The patient reports that he has been "feeling hot" since yesterday.  He reports that family documented a fever of 100.8 at home earlier today.  He reports associated fatigue, diarrhea, and a nonproductive cough.  He denies vomiting, chest pain, shortness of breath, abdominal pain, rash.  No known sick contacts.  Patient is fully vaccinated against COVID-19.  Patient received his Tdap immunization in his PCPs office 2 days ago.  Review of Systems  Positive: Fever, fatigue, diarrhea, cough Negative: Abdominal pain, chest pain, shortness of breath, vomiting, rash  Physical Exam  BP 91/62 (BP Location: Right Arm)   Pulse 78   Temp 98.2 F (36.8 C) (Oral)   Resp 14   Ht 5\' 11"  (1.803 m)   Wt 87.1 kg   SpO2 97%   BMI 26.78 kg/m  Gen:   Awake, no distress  Resp:  Normal effort, lungs are clear to auscultation bilaterally MSK:   Moves extremities without difficulty Other:    Medical Decision Making  Medically screening exam initiated at 10:47 PM.  Appropriate orders placed.  [name] was informed that the remainder of the evaluation will be completed by another provider, this initial triage assessment does not replace that evaluation, and the importance of remaining in the ED until their evaluation is complete.  80 year old male presenting with fever, diarrhea, fatigue, and cough, onset yesterday.  Will order basic labs and a chest x-ray.  Patient will require further work-up and evaluation in the emergency department.   [name] A, PA-C 06/02/20 2249    [name] Speak, MD 06/02/20 2256

## 2020-06-03 ENCOUNTER — Emergency Department (HOSPITAL_COMMUNITY): Payer: Medicare HMO

## 2020-06-03 DIAGNOSIS — R509 Fever, unspecified: Secondary | ICD-10-CM | POA: Diagnosis not present

## 2020-06-03 DIAGNOSIS — R21 Rash and other nonspecific skin eruption: Secondary | ICD-10-CM | POA: Diagnosis not present

## 2020-06-03 DIAGNOSIS — R110 Nausea: Secondary | ICD-10-CM | POA: Diagnosis not present

## 2020-06-03 DIAGNOSIS — T50Z95A Adverse effect of other vaccines and biological substances, initial encounter: Secondary | ICD-10-CM | POA: Diagnosis not present

## 2020-06-03 DIAGNOSIS — R42 Dizziness and giddiness: Secondary | ICD-10-CM | POA: Diagnosis not present

## 2020-06-03 DIAGNOSIS — R059 Cough, unspecified: Secondary | ICD-10-CM | POA: Diagnosis not present

## 2020-06-03 LAB — RESP PANEL BY RT-PCR (FLU A&B, COVID) ARPGX2
Influenza A by PCR: NEGATIVE
Influenza B by PCR: NEGATIVE
SARS Coronavirus 2 by RT PCR: NEGATIVE

## 2020-06-03 MED ORDER — SODIUM CHLORIDE 0.9 % IV SOLN
1.0000 g | Freq: Once | INTRAVENOUS | Status: AC
  Administered 2020-06-03: 1 g via INTRAVENOUS
  Filled 2020-06-03: qty 10

## 2020-06-03 MED ORDER — ONDANSETRON HCL 4 MG/2ML IJ SOLN
4.0000 mg | Freq: Once | INTRAMUSCULAR | Status: AC
  Administered 2020-06-03: 4 mg via INTRAVENOUS
  Filled 2020-06-03: qty 2

## 2020-06-03 MED ORDER — ONDANSETRON 8 MG PO TBDP: 8.0000 mg | ORAL_TABLET | Freq: Three times a day (TID) | ORAL | 0 refills | Status: DC | PRN

## 2020-06-03 MED ORDER — ACETAMINOPHEN 500 MG PO TABS
1000.0000 mg | ORAL_TABLET | Freq: Once | ORAL | Status: AC
  Administered 2020-06-03: 1000 mg via ORAL
  Filled 2020-06-03: qty 2

## 2020-06-03 MED ORDER — LACTATED RINGERS IV BOLUS
500.0000 mL | Freq: Once | INTRAVENOUS | Status: AC
  Administered 2020-06-03: 500 mL via INTRAVENOUS

## 2020-06-03 MED ORDER — CEPHALEXIN 500 MG PO CAPS: 500.0000 mg | ORAL_CAPSULE | Freq: Four times a day (QID) | ORAL | 0 refills | Status: DC

## 2020-06-03 NOTE — ED Notes (Signed)
The pt feels better less nausea

## 2020-06-03 NOTE — ED Provider Notes (Signed)
[facility] EMERGENCY DEPARTMENT Provider Note   CSN: [id number] Arrival date & time: 06/02/20  2207     History Chief Complaint  Patient presents with  . Nausea/Dizzy "Feels Hot"    [name] is a 80 y.o. male.  80 year old male with medical problems document below who presents emerged department today with a fever at home and left arm pain.  Patient states that he received a Tdap shot couple days ago.  Over the last couple days of progressively worsening swelling and redness to the left arm in the same area.  States he is never had a reaction of this before.  Today he was not feeling well little bit nauseous and warm she checked his temperature was 100.8 at home.  Patient states that he did not taking medication prior to arrival.  No vomiting, diarrhea or constipation.  No other associated symptoms.        Past Medical History:  Diagnosis Date  . Aortic root dilatation ([place]) 2012   Seen on Echo in 2012.  7mm  . Asthma, chronic 07/13/2013   PFT's 05/2012 : FEV1 / FVC 75 and 78 pre and post BD. FEV1 74 and 75 pre and post BD. TLC 72. RV 93. DLCO 79. Read as restrictive lung disease, possible with obstructive lung disease. Prior smoker.    Marland Kitchen BPH (benign prostatic hyperplasia)   . Colitis    3-4 ulcers on colonoscopy in 12/05  . Depression   . Diverticulitis    colonoscopy in 12/05  . Gastritis   . History of hypercholesterolemia   . HTN (hypertension)   . Hypercholesterolemia   . Osteoarthritis    Primarily left knee, acute right knee swelling in 9/07  . Other chest pain    Adenosine myoview in 2004 showing EF 52% with no evidence for ischemia.  There was a basal inferior fixed defect that my have been artifact.  . PE (pulmonary thromboembolism) ([place]) 09/13/2006   In 2008. On Warfarin until 01/2010 when he was admitted for syncope and noticed that he had completed course of warfarin and it was stopped.   . Syncope    Suspect neurocardiogenic.  Always  with standing or micturation.  Had bradycardic/hypotensive response with standing when hospitalized in 1/12.  On Florinef.  Echo (1/12): EF 60-65%, mild aortic root dilation.   . Wrist disorder    Proximal carpo-scapholunate dissosiation -seen by Dr. Derryl Harbor    Patient Active Problem List   Diagnosis Date Noted  . Decreased hearing of both ears 06/01/2020  . Effusion of left knee 05/21/2019  . Gout, chronic, without tophus 10/12/2015  . Counseling regarding end of life decision making 04/13/2015  . OA (osteoarthritis) of knee 11/05/2013  . BPH (benign prostatic hyperplasia) 08/04/2013  . Asthma, chronic 07/13/2013  . Healthcare maintenance 11/26/2012  . Glaucoma 06/12/2012  . Hematuria 04/10/2012  . Thoracic aortic aneurysm ([place]) 01/10/2012  . Herpes infection 09/14/2010  . Vitamin B 12 deficiency 02/21/2010  . History of pulmonary embolism 09/13/2006  . Essential hypertension 12/12/2005    Past Surgical History:  Procedure Laterality Date  . I & D EXTREMITY Right 03/04/2014   Procedure: IRRIGATION AND DEBRIDEMENT FINGER / HAND;  Surgeon: Dayna Barker, MD;  Location: [place];  Service: Plastics;  Laterality: Right;  I&D Right Small Finger and Right Wrist  . TRANSURETHRAL RESECTION OF PROSTATE  3/05   [name]       Family History  Problem Relation Age of Onset  .  Heart disease Father   . Stroke Father   . Heart disease Brother 50    Social History   Tobacco Use  . Smoking status: Former Research scientist (life sciences)  . Smokeless tobacco: Never Used  Vaping Use  . Vaping Use: Never used  Substance Use Topics  . Alcohol use: No  . Drug use: No    Home Medications Prior to Admission medications   Medication Sig Start Date End Date Taking? Authorizing Provider  cephALEXin (KEFLEX) 500 MG capsule Take 1 capsule (500 mg total) by mouth 4 (four) times daily. 06/03/20  Yes Christia Domke, Corene Cornea, MD  ondansetron (ZOFRAN ODT) 8 MG disintegrating tablet Take 1 tablet (8 mg total) by mouth every 8 (eight)  hours as needed. 8mg  ODT q4 hours prn nausea 06/03/20  Yes Moxie Kalil, Corene Cornea, MD  albuterol (PROVENTIL HFA) 108 (90 Base) MCG/ACT inhaler Inhale 1-2 puffs into the lungs every 6 (six) hours as needed for wheezing or shortness of breath. 06/01/20   Angelica Pou, MD  aspirin EC 81 MG tablet Take 81 mg by mouth daily.     [provider]  colchicine 0.6 MG tablet Take 1 tablet (0.6 mg total) by mouth daily. 06/01/20 06/01/21  Angelica Pou, MD  latanoprost (XALATAN) 0.005 % ophthalmic solution 1 drop at bedtime. 12/14/18   [provider]  lisinopril-hydrochlorothiazide (ZESTORETIC) 20-12.5 MG tablet Take 1 tablet by mouth daily. 06/01/20   Angelica Pou, MD    Allergies    Patient has no known allergies.  Review of Systems   Review of Systems  All other systems reviewed and are negative.   Physical Exam Updated Vital Signs BP (!) 94/51   Pulse 61   Temp 98.2 F (36.8 C) (Oral)   Resp 17   Ht 5\' 11"  (1.803 m)   Wt 87.1 kg   SpO2 97%   BMI 26.78 kg/m   Physical Exam Vitals and nursing note reviewed.  Constitutional:      Appearance: He is well-developed.  HENT:     Head: Normocephalic and atraumatic.     Mouth/Throat:     Mouth: Mucous membranes are moist.     Pharynx: Oropharynx is clear.  Eyes:     Pupils: Pupils are equal, round, and reactive to light.  Cardiovascular:     Rate and Rhythm: Normal rate.  Pulmonary:     Effort: Pulmonary effort is normal. No respiratory distress.  Abdominal:     General: There is no distension.  Musculoskeletal:        General: Normal range of motion.     Cervical back: Normal range of motion.  Skin:    General: Skin is warm and dry.     Findings: Erythema (left lateral shoulder/deltoid area) and rash present.  Neurological:     General: No focal deficit present.     Mental Status: He is alert.     ED Results / Procedures / Treatments   Labs (all labs ordered are listed, but only abnormal results are  displayed) Labs Reviewed  COMPREHENSIVE METABOLIC PANEL - Abnormal; Notable for the following components:      Result Value   Sodium 131 (*)    Creatinine, Ser 1.40 (*)    GFR, Estimated 51 (*)    All other components within normal limits  URINALYSIS, ROUTINE W REFLEX MICROSCOPIC - Abnormal; Notable for the following components:   Hgb urine dipstick MODERATE (*)    All other components within normal limits  RESP PANEL  BY RT-PCR (FLU A&B, COVID) ARPGX2  CBC WITH DIFFERENTIAL/PLATELET    EKG None  Radiology DG Chest Portable 1 View  Result Date: 06/03/2020 CLINICAL DATA:  81 year old male with fever and cough. EXAM: PORTABLE CHEST 1 VIEW COMPARISON:  Chest radiograph dated 01/19/2018. FINDINGS: The heart size and mediastinal contours are within normal limits. Both lungs are clear. The visualized skeletal structures are unremarkable. IMPRESSION: No active disease. Electronically Signed   By: Anner Crete M.D.   On: 06/03/2020 02:26    Procedures Procedures   Medications Ordered in ED Medications  cefTRIAXone (ROCEPHIN) 1 g in sodium chloride 0.9 % 100 mL IVPB (0 g Intravenous Stopped 06/03/20 0454)  acetaminophen (TYLENOL) tablet 1,000 mg (1,000 mg Oral Given 06/03/20 0434)  ondansetron (ZOFRAN) injection 4 mg (4 mg Intravenous Given 06/03/20 0434)  lactated ringers bolus 500 mL (0 mLs Intravenous Stopped 06/03/20 0557)    ED Course  I have reviewed the triage vital signs and the nursing notes.  Pertinent labs & imaging results that were available during my care of the patient were reviewed by me and considered in my medical decision making (see chart for details).    MDM Rules/Calculators/A&P                          Patient with localized reaction to the vaccine however with a fever and not getting better even 3 days later we will treat for possible bacterial infection.  Had some soft pressures but patient overall appears well not septic.  Tolerating p.o. prior to discharge.   Will DC on antibiotics with close pcp follow up.   Final Clinical Impression(s) / ED Diagnoses Final diagnoses:  Adverse effect of vaccine, initial encounter    Rx / DC Orders ED Discharge Orders         Ordered    ondansetron (ZOFRAN ODT) 8 MG disintegrating tablet  Every 8 hours PRN        06/03/20 0643    cephALEXin (KEFLEX) 500 MG capsule  4 times daily        06/03/20 0643           Toniqua Melamed, Corene Cornea, MD 06/03/20 2316

## 2020-06-03 NOTE — ED Notes (Signed)
The pt reports that he is just sick for ??? Number of days

## 2020-06-05 ENCOUNTER — Encounter: Payer: Self-pay | Admitting: Internal Medicine

## 2020-06-05 NOTE — Assessment & Plan Note (Signed)
No voiding problems at this time (no delayed or incomplete emptying, no urgency, leakage or burning). Monitor.

## 2020-06-05 NOTE — Assessment & Plan Note (Signed)
[place] gtts; "I need to go see my eye doctor" for routine check up.

## 2020-06-05 NOTE — Assessment & Plan Note (Signed)
Mr. Virginia reports that his granddaughter is in possession of completed paperwork.  He feels that "someone in my family will take care of me" in the event that he cannot make his own decisions.  As he hasn't been explicit with his requests, it would be ideal to update his wishes in the EMR.

## 2020-06-05 NOTE — Assessment & Plan Note (Signed)
Decreased hearing at conversational volume.  Son says "I think Dad's hearing is selective" (laughts).  Canals patent, TMs nml.  Patient declines hearing evaluation and has no desire to wear hearing aides.

## 2020-06-05 NOTE — Assessment & Plan Note (Signed)
Well controlled on  lisinopril 20, HCTZ 12.5 which he has taken successfully for years.  No orthostatic changes on screening today. Continue treatment, monitor.

## 2020-06-05 NOTE — Assessment & Plan Note (Signed)
L>R knee OA not functionally limiting at this time.  Very mild joint thickening and small effusion with minimal symptoms.  No particular intervention at this time.

## 2020-06-05 NOTE — Assessment & Plan Note (Addendum)
No recent attacks.   Dx is clinical.   Infrequent occurences, usually toes. Note his thiazide does not appear to be precipitating events - he has taken it for years.

## 2020-06-05 NOTE — Assessment & Plan Note (Signed)
Last level 776 in 2020, pt has continued to take daily supplement.  Recheck level today with discontinuation if remaining high, then recheck next visit.

## 2020-07-06 ENCOUNTER — Telehealth: Payer: Self-pay

## 2020-07-06 NOTE — Telephone Encounter (Signed)
RTC, VM obtained and message left to call [facility] back and let us know if this is a permanent pharmacy change so we can update the chart.  Also informed daughter to transfer the RX, she needs to call the new pharmacy and let the pharmacy know and the pharmacy will call the previous pharmacy for the transfer. SChaplin, RN,BSN

## 2020-07-06 NOTE — Telephone Encounter (Signed)
Pt's daughter requesting gout med to be transferred to [facility] on [address].

## 2020-08-01 ENCOUNTER — Encounter: Payer: Self-pay | Admitting: *Deleted

## 2020-10-25 ENCOUNTER — Emergency Department (HOSPITAL_COMMUNITY)
Admission: EM | Admit: 2020-10-25 | Discharge: 2020-10-25 | Disposition: A | Discharge status: Home / Self Care | Payer: Medicare HMO | Attending: Emergency Medicine | Admitting: Emergency Medicine

## 2020-10-25 ENCOUNTER — Encounter (HOSPITAL_COMMUNITY): Payer: Self-pay | Admitting: Emergency Medicine

## 2020-10-25 ENCOUNTER — Emergency Department (HOSPITAL_COMMUNITY): Payer: Medicare HMO

## 2020-10-25 ENCOUNTER — Other Ambulatory Visit: Payer: Self-pay

## 2020-10-25 DIAGNOSIS — R10816 Epigastric abdominal tenderness: Secondary | ICD-10-CM | POA: Diagnosis not present

## 2020-10-25 DIAGNOSIS — R112 Nausea with vomiting, unspecified: Secondary | ICD-10-CM | POA: Insufficient documentation

## 2020-10-25 DIAGNOSIS — I10 Essential (primary) hypertension: Secondary | ICD-10-CM | POA: Insufficient documentation

## 2020-10-25 DIAGNOSIS — Z7982 Long term (current) use of aspirin: Secondary | ICD-10-CM | POA: Insufficient documentation

## 2020-10-25 DIAGNOSIS — Z79899 Other long term (current) drug therapy: Secondary | ICD-10-CM | POA: Insufficient documentation

## 2020-10-25 DIAGNOSIS — E11649 Type 2 diabetes mellitus with hypoglycemia without coma: Secondary | ICD-10-CM | POA: Diagnosis not present

## 2020-10-25 DIAGNOSIS — R9431 Abnormal electrocardiogram [ECG] [EKG]: Secondary | ICD-10-CM | POA: Diagnosis not present

## 2020-10-25 DIAGNOSIS — K828 Other specified diseases of gallbladder: Secondary | ICD-10-CM

## 2020-10-25 DIAGNOSIS — I959 Hypotension, unspecified: Secondary | ICD-10-CM | POA: Insufficient documentation

## 2020-10-25 DIAGNOSIS — Z20822 Contact with and (suspected) exposure to covid-19: Secondary | ICD-10-CM | POA: Insufficient documentation

## 2020-10-25 DIAGNOSIS — R1110 Vomiting, unspecified: Secondary | ICD-10-CM | POA: Diagnosis not present

## 2020-10-25 DIAGNOSIS — E860 Dehydration: Secondary | ICD-10-CM

## 2020-10-25 DIAGNOSIS — Z87891 Personal history of nicotine dependence: Secondary | ICD-10-CM | POA: Diagnosis not present

## 2020-10-25 DIAGNOSIS — R42 Dizziness and giddiness: Secondary | ICD-10-CM | POA: Diagnosis not present

## 2020-10-25 DIAGNOSIS — E161 Other hypoglycemia: Secondary | ICD-10-CM | POA: Diagnosis not present

## 2020-10-25 DIAGNOSIS — K4021 Bilateral inguinal hernia, without obstruction or gangrene, recurrent: Secondary | ICD-10-CM

## 2020-10-25 DIAGNOSIS — E162 Hypoglycemia, unspecified: Secondary | ICD-10-CM | POA: Diagnosis not present

## 2020-10-25 DIAGNOSIS — R0902 Hypoxemia: Secondary | ICD-10-CM | POA: Diagnosis not present

## 2020-10-25 DIAGNOSIS — J4520 Mild intermittent asthma, uncomplicated: Secondary | ICD-10-CM | POA: Diagnosis not present

## 2020-10-25 DIAGNOSIS — N179 Acute kidney failure, unspecified: Secondary | ICD-10-CM | POA: Diagnosis not present

## 2020-10-25 DIAGNOSIS — N401 Enlarged prostate with lower urinary tract symptoms: Secondary | ICD-10-CM | POA: Diagnosis not present

## 2020-10-25 DIAGNOSIS — N400 Enlarged prostate without lower urinary tract symptoms: Secondary | ICD-10-CM

## 2020-10-25 DIAGNOSIS — N281 Cyst of kidney, acquired: Secondary | ICD-10-CM | POA: Diagnosis not present

## 2020-10-25 DIAGNOSIS — K5730 Diverticulosis of large intestine without perforation or abscess without bleeding: Secondary | ICD-10-CM | POA: Diagnosis not present

## 2020-10-25 DIAGNOSIS — I700 Atherosclerosis of aorta: Secondary | ICD-10-CM | POA: Diagnosis not present

## 2020-10-25 LAB — URINALYSIS, ROUTINE W REFLEX MICROSCOPIC
Bacteria, UA: NONE SEEN
Bilirubin Urine: NEGATIVE
Glucose, UA: NEGATIVE mg/dL
Ketones, ur: 20 mg/dL — AB
Leukocytes,Ua: NEGATIVE
Nitrite: NEGATIVE
Protein, ur: NEGATIVE mg/dL
Specific Gravity, Urine: 1.016 (ref 1.005–1.030)
pH: 5 (ref 5.0–8.0)

## 2020-10-25 LAB — LIPASE, BLOOD: Lipase: 53 U/L — ABNORMAL HIGH (ref 11–51)

## 2020-10-25 LAB — CBC WITH DIFFERENTIAL/PLATELET
Abs Immature Granulocytes: 0.01 10*3/uL (ref 0.00–0.07)
Basophils Absolute: 0 10*3/uL (ref 0.0–0.1)
Basophils Relative: 1 %
Eosinophils Absolute: 0 10*3/uL (ref 0.0–0.5)
Eosinophils Relative: 0 %
HCT: 44.9 % (ref 39.0–52.0)
Hemoglobin: 14.4 g/dL (ref 13.0–17.0)
Immature Granulocytes: 0 %
Lymphocytes Relative: 44 %
Lymphs Abs: 2 10*3/uL (ref 0.7–4.0)
MCH: 27.3 pg (ref 26.0–34.0)
MCHC: 32.1 g/dL (ref 30.0–36.0)
MCV: 85.2 fL (ref 80.0–100.0)
Monocytes Absolute: 0.4 10*3/uL (ref 0.1–1.0)
Monocytes Relative: 9 %
Neutro Abs: 2.1 10*3/uL (ref 1.7–7.7)
Neutrophils Relative %: 46 %
Platelets: 201 10*3/uL (ref 150–400)
RBC: 5.27 MIL/uL (ref 4.22–5.81)
RDW: 14.4 % (ref 11.5–15.5)
WBC: 4.5 10*3/uL (ref 4.0–10.5)
nRBC: 0 % (ref 0.0–0.2)

## 2020-10-25 LAB — COMPREHENSIVE METABOLIC PANEL
ALT: 24 U/L (ref 0–44)
AST: 48 U/L — ABNORMAL HIGH (ref 15–41)
Albumin: 3.9 g/dL (ref 3.5–5.0)
Alkaline Phosphatase: 44 U/L (ref 38–126)
Anion gap: 12 (ref 5–15)
BUN: 16 mg/dL (ref 8–23)
CO2: 22 mmol/L (ref 22–32)
Calcium: 9.1 mg/dL (ref 8.9–10.3)
Chloride: 96 mmol/L — ABNORMAL LOW (ref 98–111)
Creatinine, Ser: 1.53 mg/dL — ABNORMAL HIGH (ref 0.61–1.24)
GFR, Estimated: 46 mL/min — ABNORMAL LOW (ref >60)
Glucose, Bld: 69 mg/dL — ABNORMAL LOW (ref 70–99)
Potassium: 4.3 mmol/L (ref 3.5–5.1)
Sodium: 130 mmol/L — ABNORMAL LOW (ref 135–145)
Total Bilirubin: 0.8 mg/dL (ref 0.3–1.2)
Total Protein: 7.4 g/dL (ref 6.5–8.1)

## 2020-10-25 LAB — RESP PANEL BY RT-PCR (FLU A&B, COVID) ARPGX2
Influenza A by PCR: NEGATIVE
Influenza B by PCR: NEGATIVE
SARS Coronavirus 2 by RT PCR: NEGATIVE

## 2020-10-25 LAB — CBG MONITORING, ED
Glucose-Capillary: 63 mg/dL — ABNORMAL LOW (ref 70–99)
Glucose-Capillary: 68 mg/dL — ABNORMAL LOW (ref 70–99)
Glucose-Capillary: 100 mg/dL — ABNORMAL HIGH (ref 70–99)

## 2020-10-25 LAB — TROPONIN I (HIGH SENSITIVITY): Troponin I (High Sensitivity): 11 ng/L (ref <18)

## 2020-10-25 MED ORDER — IOHEXOL 350 MG/ML SOLN
80.0000 mL | Freq: Once | INTRAVENOUS | Status: AC | PRN
  Administered 2020-10-25: 80 mL via INTRAVENOUS

## 2020-10-25 MED ORDER — DEXTROSE 50 % IV SOLN
50.0000 mL | Freq: Once | INTRAVENOUS | Status: AC
  Administered 2020-10-25: 50 mL via INTRAVENOUS
  Filled 2020-10-25: qty 50

## 2020-10-25 MED ORDER — ONDANSETRON 4 MG PO TBDP: ORAL_TABLET | ORAL | 0 refills | Status: DC

## 2020-10-25 MED ORDER — SODIUM CHLORIDE 0.9 % IV BOLUS
1000.0000 mL | Freq: Once | INTRAVENOUS | Status: AC
  Administered 2020-10-25: 1000 mL via INTRAVENOUS

## 2020-10-25 NOTE — Discharge Instructions (Signed)
You likely have a stomach virus.  Please take Zofran for nausea and stay hydrated  Your blood sugar was low from not eating much.  You have incidental findings on your CT such as enlarged prostate and enlarged gallbladder and inguinal hernias.  Those things need to be followed up with your doctor  Recheck kidney function in a week.  Return to ER if you have worse abdominal pain, vomiting, dehydration.

## 2020-10-25 NOTE — ED Triage Notes (Signed)
Pt here from home via [facility] for N/V/D, dizziness, and decreased PO intake since Sunday. Pt endorses feeling like he has had fevers, denies sick contacts. Pt reports dizziness is worse with sitting and standing. Initila BP w/ Ems 94/64, 531ml NS given, BP after 118/70. CBG 78, 72 HR, 14 R, 97% RA. Aox4, 18g R AC

## 2020-10-25 NOTE — ED Provider Notes (Signed)
[facility] EMERGENCY DEPARTMENT Provider Note   CSN: [id number] Arrival date & time: 10/25/20  1258     History Chief Complaint  Patient presents with   Dizziness   Nausea    [name] is a 79 y.o. male history of hypertension, PE off anticoagulation here presenting with vomiting and abdominal pain and hypotension.  Patient states that he vomited several times today.  He states that he is unable to keep anything down.  Has some subjective chills as well.  EMS was called and he was noted to be hypotensive. Given 500 cc by EMS.  Patient is noted to be hypotensive again in triage.  Patient also also noted to be hypoglycemic and has no history of diabetes  The history is provided by the patient.      Past Medical History:  Diagnosis Date   Aortic root dilatation ([place]) 2012   Seen on Echo in 2012.  4mm   Asthma, chronic 07/13/2013   PFT's 05/2012 : FEV1 / FVC 75 and 78 pre and post BD. FEV1 74 and 75 pre and post BD. TLC 72. RV 93. DLCO 79. Read as restrictive lung disease, possible with obstructive lung disease. Prior smoker.     BPH (benign prostatic hyperplasia)    Colitis    3-4 ulcers on colonoscopy in 12/05   Depression    Diverticulitis    colonoscopy in 12/05   Essential hypertension 12/12/2005   Great control on HCTZ 12.5 and lisinopril 20.     Gastritis    Herpes infection 09/14/2010   Genital and oral, HSV 1 and 2 by antibody test.    History of hypercholesterolemia    History of pulmonary embolism 09/13/2006   In 2008. On Warfarin until 01/2010 when he was admitted for syncope and noticed that he had completed course of warfarin and it was stopped.    HTN (hypertension)    Hx of aortic root dilation ([place]), resolved 01/10/2012   2012: Left ventricle: no LVH.  EF 60% to 65%.  Aorta: Mildly dilated aortic root. Aortic root dimension: 65mm  2015 : Aortic root: The aortic root was top normal in size.  2015 : Stable diffuse thoracic aortic ectasia with  maximum diameter of the ascending aorta to 4.6 cm.  2017 ECHO ascending aorta nl in size     Hypercholesterolemia    Osteoarthritis    Primarily left knee, acute right knee swelling in 9/07   Other chest pain    Adenosine myoview in 2004 showing EF 52% with no evidence for ischemia.  There was a basal inferior fixed defect that my have been artifact.   PE (pulmonary thromboembolism) ([place]) 09/13/2006   In 2008. On Warfarin until 01/2010 when he was admitted for syncope and noticed that he had completed course of warfarin and it was stopped.    Syncope    Suspect neurocardiogenic.  Always with standing or micturation.  Had bradycardic/hypotensive response with standing when hospitalized in 1/12.  On Florinef.  Echo (1/12): EF 60-65%, mild aortic root dilation.    Wrist disorder    Proximal carpo-scapholunate dissosiation -seen by Dr. Derryl Harbor    Patient Active Problem List   Diagnosis Date Noted   Decreased hearing of both ears 06/01/2020   Gout, chronic, without tophus 10/12/2015   Counseling regarding end of life decision making 04/13/2015   OA (osteoarthritis) of knee 11/05/2013   BPH (benign prostatic hyperplasia) 08/04/2013   Mild intermittent asthma, uncomplicated 70/17/7939  Healthcare maintenance 11/26/2012   Glaucoma 06/12/2012   Hx of aortic root dilation ([place]), resolved 01/10/2012   Vitamin B 12 deficiency 02/21/2010    Past Surgical History:  Procedure Laterality Date   I & D EXTREMITY Right 03/04/2014   Procedure: IRRIGATION AND DEBRIDEMENT FINGER / HAND;  Surgeon: Dayna Barker, MD;  Location: [facility];  Service: Plastics;  Laterality: Right;  I&D Right Small Finger and Right Wrist   TRANSURETHRAL RESECTION OF PROSTATE  3/05   [name]       Family History  Problem Relation Age of Onset   Heart disease Father    Stroke Father    Heart disease Brother 29    Social History   Tobacco Use   Smoking status: Former   Smokeless tobacco: Never  Scientific laboratory technician Use:  Never used  Substance Use Topics   Alcohol use: No   Drug use: No    Home Medications Prior to Admission medications   Medication Sig Start Date End Date Taking? Authorizing Provider  albuterol (PROVENTIL HFA) 108 (90 Base) MCG/ACT inhaler Inhale 1-2 puffs into the lungs every 6 (six) hours as needed for wheezing or shortness of breath. 06/01/20   Angelica Pou, MD  aspirin EC 81 MG tablet Take 81 mg by mouth daily.     [provider]  cephALEXin (KEFLEX) 500 MG capsule Take 1 capsule (500 mg total) by mouth 4 (four) times daily. 06/03/20   Mesner, Corene Cornea, MD  colchicine 0.6 MG tablet Take 1 tablet (0.6 mg total) by mouth daily. 06/01/20 06/01/21  Angelica Pou, MD  latanoprost (XALATAN) 0.005 % ophthalmic solution 1 drop at bedtime. 12/14/18   [provider]  lisinopril-hydrochlorothiazide (ZESTORETIC) 20-12.5 MG tablet Take 1 tablet by mouth daily. 06/01/20   Angelica Pou, MD  ondansetron (ZOFRAN ODT) 8 MG disintegrating tablet Take 1 tablet (8 mg total) by mouth every 8 (eight) hours as needed. 8mg  ODT q4 hours prn nausea 06/03/20   Mesner, Corene Cornea, MD    Allergies    Patient has no known allergies.  Review of Systems   Review of Systems  Neurological:  Positive for dizziness.  All other systems reviewed and are negative.  Physical Exam Updated Vital Signs BP 121/70 (BP Location: Left Arm)   Pulse 82   Temp 98.9 F (37.2 C) (Oral)   Resp 14   SpO2 95%   Physical Exam Vitals and nursing note reviewed.  Constitutional:      Comments: Dehydrated and chronically ill  HENT:     Head: Normocephalic.     Nose: Nose normal.     Mouth/Throat:     Mouth: Mucous membranes are dry.  Eyes:     Extraocular Movements: Extraocular movements intact.     Pupils: Pupils are equal, round, and reactive to light.  Cardiovascular:     Rate and Rhythm: Normal rate and regular rhythm.     Pulses: Normal pulses.     Heart sounds: Normal heart sounds.  Pulmonary:      Effort: Pulmonary effort is normal.     Breath sounds: Normal breath sounds.  Abdominal:     General: Abdomen is flat.     Palpations: Abdomen is soft.     Comments: Mild epigastric tenderness  Musculoskeletal:        General: Normal range of motion.     Cervical back: Normal range of motion and neck supple.  Skin:    General: Skin is  warm.     Capillary Refill: Capillary refill takes less than 2 seconds.  Neurological:     General: No focal deficit present.     Mental Status: He is oriented to person, place, and time.  Psychiatric:        Mood and Affect: Mood normal.        Behavior: Behavior normal.    ED Results / Procedures / Treatments   Labs (all labs ordered are listed, but only abnormal results are displayed) Labs Reviewed  COMPREHENSIVE METABOLIC PANEL - Abnormal; Notable for the following components:      Result Value   Sodium 130 (*)    Chloride 96 (*)    Glucose, Bld 69 (*)    Creatinine, Ser 1.53 (*)    AST 48 (*)    GFR, Estimated 46 (*)    All other components within normal limits  LIPASE, BLOOD - Abnormal; Notable for the following components:   Lipase 53 (*)    All other components within normal limits  URINALYSIS, ROUTINE W REFLEX MICROSCOPIC - Abnormal; Notable for the following components:   APPearance HAZY (*)    Hgb urine dipstick SMALL (*)    Ketones, ur 20 (*)    All other components within normal limits  CBG MONITORING, ED - Abnormal; Notable for the following components:   Glucose-Capillary 63 (*)    All other components within normal limits  CBG MONITORING, ED - Abnormal; Notable for the following components:   Glucose-Capillary 68 (*)    All other components within normal limits  CBG MONITORING, ED - Abnormal; Notable for the following components:   Glucose-Capillary 100 (*)    All other components within normal limits  RESP PANEL BY RT-PCR (FLU A&B, COVID) ARPGX2  CBC WITH DIFFERENTIAL/PLATELET  CBG MONITORING, ED  TROPONIN I  (HIGH SENSITIVITY)    EKG EKG Interpretation  Date/Time:  Wednesday October 25 2020 17:30:59 EDT Ventricular Rate:  77 PR Interval:  198 QRS Duration: 101 QT Interval:  370 QTC Calculation: 419 R Axis:   -27 Text Interpretation: Sinus rhythm Borderline left axis deviation Abnormal R-wave progression, early transition No significant change since last tracing Confirmed by Wandra Arthurs [phone]) on 10/25/2020 5:38:52 PM  Radiology CT ABDOMEN PELVIS W CONTRAST  Addendum Date: 10/25/2020   ADDENDUM REPORT: 10/25/2020 19:34 ADDENDUM: These results were called by telephone at the time of interpretation on 10/25/2020 at 7:34 pm to provider Zakery Normington , who verbally acknowledged these results. Electronically Signed   By: Zetta Bills M.D.   On: 10/25/2020 19:34   Result Date: 10/25/2020 CLINICAL DATA:  Abdominal distension in a 80 year old male. EXAM: CT ABDOMEN AND PELVIS WITH CONTRAST TECHNIQUE: Multidetector CT imaging of the abdomen and pelvis was performed using the standard protocol following bolus administration of intravenous contrast. CONTRAST:  60mL OMNIPAQUE IOHEXOL 350 MG/ML SOLN COMPARISON:  Comparison made with March of 2014. FINDINGS: Lower chest: Lung bases are clear.  No effusion.  No consolidation. Hepatobiliary: No focal, suspicious hepatic lesion. No pericholecystic stranding. No biliary duct dilation. Portal vein is patent. The gallbladder is distended though without signs of adjacent inflammation. Pancreas: Normal, without mass, inflammation or ductal dilatation. Spleen: Spleen normal size and contour. Adrenals/Urinary Tract: Adrenal glands are normal. Symmetric renal enhancement with bilateral renal cysts, largest on the LEFT with decompression of a lateral lower pole LEFT renal cyst since the previous study now 1.8 cm as compared to 3.9 cm. RIGHT renal cyst also unchanged in the  lower pole with smaller cysts elsewhere in the kidneys. Urinary bladder without signs of adjacent  inflammation. Stomach/Bowel: Stomach under distended. Query gastric thickening. No signs of perigastric stranding. Appendix is normal. Sigmoid diverticulosis without signs of diverticulitis. Small bowel is normal caliber. Vascular/Lymphatic: Aortic atherosclerosis. No sign of aneurysm. Smooth contour of the IVC. There is no gastrohepatic or hepatoduodenal ligament lymphadenopathy. No retroperitoneal or mesenteric lymphadenopathy. No pelvic sidewall lymphadenopathy. Reproductive: Low-density areas in the bilateral testes on the LEFT measuring 11 mm. The testes are within the lower inguinal canal above the scrotum on the current study previously in the scrotum on the prior exam. There are fat containing bilateral inguinal hernias as well. The prostate shows marked heterogeneity and hyperenhancement which is a nonspecific finding though is more pronounced than on previous imaging. Presumed changes of TURP. Area of hyperenhancement of the prostate best seen on image 85/3 measuring approximately 2.8 x 1.7 cm. Other: No free air.  No ascites. Musculoskeletal: No acute bone finding or destructive bone process. Degenerative changes in the hips and in the spine. IMPRESSION: Query gastric thickening. Correlate with any symptoms of gastritis. Moderate distension of the gallbladder without adjacent stranding and no evidence of biliary duct distension. Further assessment could be performed based on symptoms and laboratory values of the biliary tree as warranted. Hyperenhancement of the prostate with focal features. Findings are nonspecific but can be seen in the setting of prostate neoplasm. PSA correlation is suggested with urology follow-up as warranted. Low-density areas in the testes bilaterally or in the epididymis adjacent. Follow-up testicular sonogram may be helpful. Moderate fat containing bilateral inguinal hernias. Sigmoid diverticulosis without signs of diverticulitis. Bilateral renal cysts. Aortic Atherosclerosis  (ICD10-I70.0). Electronically Signed: By: Zetta Bills M.D. On: 10/25/2020 19:21   DG Chest Port 1 View  Result Date: 10/25/2020 CLINICAL DATA:  Vomiting EXAM: PORTABLE CHEST 1 VIEW COMPARISON:  06/03/2020 FINDINGS: The heart size and mediastinal contours are within normal limits. Both lungs are clear. The visualized skeletal structures are unremarkable. IMPRESSION: No active disease. Electronically Signed   By: Kathreen Devoid M.D.   On: 10/25/2020 18:23    Procedures Procedures   Medications Ordered in ED Medications  dextrose 50 % solution 50 mL (50 mLs Intravenous Given 10/25/20 1739)  sodium chloride 0.9 % bolus 1,000 mL (0 mLs Intravenous Stopped 10/25/20 1943)  iohexol (OMNIPAQUE) 350 MG/ML injection 80 mL (80 mLs Intravenous Contrast Given 10/25/20 1852)    ED Course  I have reviewed the triage vital signs and the nursing notes.  Pertinent labs & imaging results that were available during my care of the patient were reviewed by me and considered in my medical decision making (see chart for details).    MDM Rules/Calculators/A&P                           [name] is a 80 y.o. male here with hypotension and hypoglycemia.  Patient is hypotensive in the ED.  Appears dehydrated and has mild epigastric tenderness.  Will get CT abdomen pelvis and labs and will hydrate patient.   8:20 PM Glucose up to 100 now.  Blood pressure is normal after IV fluids.  CT has no acute findings but there is a lot of incidental findings such as enhancement of the prostate, fat-containing inguinal hernias, gallbladder distention with no obvious acute cholecystitis.  Patient has no vomiting.  Patient is feeling better now.  Likely viral gastro. Stable for discharge  Final Clinical Impression(s) / ED Diagnoses Final diagnoses:  None    Rx / DC Orders ED Discharge Orders     None        Drenda Freeze, MD 10/25/20 2022

## 2020-10-25 NOTE — ED Provider Notes (Signed)
Emergency Medicine Provider Triage Evaluation Note  [name] , a 80 y.o. male  was evaluated in triage.  Pt complains of hypotension. Per patient he has been sick since Sunday with nausea, vomiting, and diarrhea. He felt like he was going to pass out today and called EMS. HE was hypotensive with them at 94/64 and provided with 500 CC fluid bolus. Pt denies abdominal pain. He states he has had some cramping in his R leg. He thinks he may have ate "bad collared greens." Denies recent sick contacts.   Review of Systems  Positive: + subjective fevers, nausea, vomiting, diarrhea, pre syncope Negative: - abdominal pain, syncope, chest pain, SOB  Physical Exam  BP 119/72 (BP Location: Left Arm)   Pulse 83   Temp 97.9 F (36.6 C) (Oral)   Resp 14   SpO2 100%  Gen:   Awake, no distress   Resp:  Normal effort  MSK:   Moves extremities without difficulty  Other:  No abdominal TTP.   Medical Decision Making  Medically screening exam initiated at 1:10 PM.  Appropriate orders placed.  [name] was informed that the remainder of the evaluation will be completed by another provider, this initial triage assessment does not replace that evaluation, and the importance of remaining in the ED until their evaluation is complete.     Eustaquio Maize, PA-C 10/25/20 1312    Tegeler, Gwenyth Allegra, MD 10/25/20 [phone]

## 2020-10-25 NOTE — ED Notes (Signed)
RN reviewed discharge instructions w/ pt. Follow up and prescriptions reviewed, pt had no further questions. Son called to pick pt up

## 2020-10-30 ENCOUNTER — Ambulatory Visit (INDEPENDENT_AMBULATORY_CARE_PROVIDER_SITE_OTHER): Payer: Medicare HMO | Admitting: Internal Medicine

## 2020-10-30 ENCOUNTER — Other Ambulatory Visit: Payer: Self-pay

## 2020-10-30 ENCOUNTER — Encounter: Payer: Self-pay | Admitting: Internal Medicine

## 2020-10-30 VITALS — BP 120/72 | HR 56 | Temp 97.9°F | Resp 24 | Ht 71.0 in | Wt 175.0 lb

## 2020-10-30 DIAGNOSIS — N400 Enlarged prostate without lower urinary tract symptoms: Secondary | ICD-10-CM

## 2020-10-30 DIAGNOSIS — M1A3710 Chronic gout due to renal impairment, right ankle and foot, without tophus (tophi): Secondary | ICD-10-CM

## 2020-10-30 DIAGNOSIS — N179 Acute kidney failure, unspecified: Secondary | ICD-10-CM

## 2020-10-30 DIAGNOSIS — R7989 Other specified abnormal findings of blood chemistry: Secondary | ICD-10-CM | POA: Diagnosis not present

## 2020-10-30 DIAGNOSIS — N1831 Chronic kidney disease, stage 3a: Secondary | ICD-10-CM

## 2020-10-30 DIAGNOSIS — N183 Chronic kidney disease, stage 3 unspecified: Secondary | ICD-10-CM | POA: Insufficient documentation

## 2020-10-30 DIAGNOSIS — R198 Other specified symptoms and signs involving the digestive system and abdomen: Secondary | ICD-10-CM

## 2020-10-30 NOTE — Assessment & Plan Note (Signed)
Patient reports recent gout flare for which he took colchicine. He denies any further foot pain since then. On examination, has bunions of bilateral feet. Does not appear to be acutely inflamed at this time.

## 2020-10-30 NOTE — Patient Instructions (Addendum)
Mr [name],  It was a pleasure seeing you in clinic. Today we discussed:   Recent ED visit: I'm glad you're feeling better today. I am going to check on some lab work today to check on your kidneys. I will call you with any abnormal results.    If you have any questions or concerns, please call our clinic at [phone] between 9am-5pm and after hours call [phone] and ask for the internal medicine resident on call. If you feel you are having a medical emergency please call 911.   Thank you, we look forward to helping you remain healthy!

## 2020-10-30 NOTE — Assessment & Plan Note (Addendum)
Patient with history of BPH s/p TURP in 2005. He denies any voiding problems at this time. Most recent CT Abdomen/Pelvis obtained on 10/25/2020 in ED with  hyperenhancement of the prostate with focal features concerning for possible neoplasm.  Patient also noted to have low-density areas in the testes bilaterally or in the epididymis adjacent and recommended for follow up testicular sonogram. On further questioning, patient does report some lower back pain that has been persistent.   Plan: PSA Lower lumbar imaging to evaluate for bony mets Consider referral to urology following    ADDENDUM: PSA elevated to 21.5. Given radiographic findings concerning for possible neoplasm, will refer to urologist for biopsy Lumbar X-ray results pending

## 2020-10-30 NOTE — Progress Notes (Signed)
CC: follow up recent ED visit for GI symptoms and hypotension  HPI:  [name] is a 80 y.o. male with PMHx as stated below presenting for follow up from his recent ED visit for GI symptoms and hypotension. Patient was evaluated on 10/25/2020 for abdominal pain, vomiting, hypotension and hypoglycemia.  Patient's blood pressure improved with IV fluid resuscitation.  CT abdomen pelvis obtained that was negative for any acute intra-abdominal abnormalities.  Glucose improved to 100.  Patient discharged home with PCP follow-up.  Since then, patient reports feeling well overall with improvement in abdominal pain.  He denies any further episodes of nausea or vomiting.  Reports that his stools are normal.  Please see problem based charting for complete assessment and plan.  Past Medical History:  Diagnosis Date   Aortic root dilatation ([place]) 2012   Seen on Echo in 2012.  41mm   Asthma, chronic 07/13/2013   PFT's 05/2012 : FEV1 / FVC 75 and 78 pre and post BD. FEV1 74 and 75 pre and post BD. TLC 72. RV 93. DLCO 79. Read as restrictive lung disease, possible with obstructive lung disease. Prior smoker.     BPH (benign prostatic hyperplasia)    Colitis    3-4 ulcers on colonoscopy in 12/05   Depression    Diverticulitis    colonoscopy in 12/05   Essential hypertension 12/12/2005   Great control on HCTZ 12.5 and lisinopril 20.     Gastritis    Herpes infection 09/14/2010   Genital and oral, HSV 1 and 2 by antibody test.    History of hypercholesterolemia    History of pulmonary embolism 09/13/2006   In 2008. On Warfarin until 01/2010 when he was admitted for syncope and noticed that he had completed course of warfarin and it was stopped.    HTN (hypertension)    Hx of aortic root dilation ([place]), resolved 01/10/2012   2012: Left ventricle: no LVH.  EF 60% to 65%.  Aorta: Mildly dilated aortic root. Aortic root dimension: 42mm  2015 : Aortic root: The aortic root was top normal in size.  2015 :  Stable diffuse thoracic aortic ectasia with maximum diameter of the ascending aorta to 4.6 cm.  2017 ECHO ascending aorta nl in size     Hypercholesterolemia    Osteoarthritis    Primarily left knee, acute right knee swelling in 9/07   Other chest pain    Adenosine myoview in 2004 showing EF 52% with no evidence for ischemia.  There was a basal inferior fixed defect that my have been artifact.   PE (pulmonary thromboembolism) ([place]) 09/13/2006   In 2008. On Warfarin until 01/2010 when he was admitted for syncope and noticed that he had completed course of warfarin and it was stopped.    Syncope    Suspect neurocardiogenic.  Always with standing or micturation.  Had bradycardic/hypotensive response with standing when hospitalized in 1/12.  On Florinef.  Echo (1/12): EF 60-65%, mild aortic root dilation.    Wrist disorder    Proximal carpo-scapholunate dissosiation -seen by Dr. Derryl Harbor   Review of Systems:  Negative except as stated in HPI.  Physical Exam:  Vitals:   10/30/20 1103  BP: 120/72  Pulse: (!) 56  Resp: (!) 24  Temp: 97.9 F (36.6 C)  TempSrc: Oral  SpO2: 100%  Weight: 175 lb (79.4 kg)  Height: 5\' 11"  (1.803 m)   Physical Exam  Constitutional: Elderly male, well-developed and well-nourished. No distress.  Cardiovascular:  Normal rate, regular rhythm, S1 and S2 present, no murmurs, rubs, gallops.  Distal pulses intact Respiratory: No respiratory distress, Lungs are clear to auscultation bilaterally. GI: Nondistended, soft, nontender to palpation, normal bowel sounds Musculoskeletal: Normal bulk and tone.  No peripheral edema noted. Neurological: Is alert and oriented x4, no apparent focal deficits noted. Skin: Warm and dry.  Bilateral feet with dry flaky skin; bunions on bilateral feet nontender, nonerythematous; toenails with discoloration mostly in the first great toe of bilateral feet Psychiatric: Normal mood and affect. Behavior is normal. Judgment and thought content  normal.    Assessment & Plan:   See Encounters Tab for problem based charting.  Patient discussed with Dr. Daryll Drown

## 2020-10-30 NOTE — Assessment & Plan Note (Signed)
Patient presents for follow up of his ED visit on 10/25/2020 for abdominal pain, vomiting and hypotension and hypoglycemia. He was given IV fluids and D50 with improvement in symptoms.  Patient reports that approximately one week prior to his presentation, he suspected a gout flare for which he started taking colchicine. Following this, he developed diarrhea. He notes eating turnips as well and subsequently, he started having abdominal pain and vomiting. He was unable to tolerate any oral intake for approximately 1-2 days. EMS called and patient noted to be hypotensive. He received 500cc bolus via EMS and was persistently hypotensive in triage. He received another 1L bolus with improvement in his blood pressure. He was also noted to be hypoglycemic to 69 which improved to 100 with D50 administration. Patient does not have a history of diabetes. Labs significant for slight increase in his serum creatinine and AST, likely in the setting of dehydration. CT abdomen/pelvis obtained which was significant for gastric thickening, possibly in setting of gastritis, moderate GB distension without evidence of biliary duct distension or signs of acute cholecystitis,  moderate fat containing bilateral inguinal hernias, bilateral renal cysts and sigmoid diverticulosis without diverticulitis. Patient improved with symptomatic treatment.   Since his ED visit, patient reports improvement in symptoms. No further episodes of diarrhea. Last bowel movement was yesterday and reports that it was formed. No further abdominal pain or nausea. He is tolerating oral intake. Physical exam is unremarkable.   Plan: Repeat CMP to evaluate renal function

## 2020-10-31 ENCOUNTER — Ambulatory Visit (HOSPITAL_COMMUNITY)
Admission: RE | Admit: 2020-10-31 | Discharge: 2020-10-31 | Disposition: A | Discharge status: Home / Self Care | Payer: Medicare HMO | Source: Ambulatory Visit | Attending: Internal Medicine | Admitting: Internal Medicine

## 2020-10-31 ENCOUNTER — Other Ambulatory Visit: Payer: Self-pay

## 2020-10-31 DIAGNOSIS — R7989 Other specified abnormal findings of blood chemistry: Secondary | ICD-10-CM | POA: Insufficient documentation

## 2020-10-31 DIAGNOSIS — N400 Enlarged prostate without lower urinary tract symptoms: Secondary | ICD-10-CM | POA: Diagnosis not present

## 2020-10-31 DIAGNOSIS — M545 Low back pain, unspecified: Secondary | ICD-10-CM | POA: Diagnosis not present

## 2020-10-31 LAB — CMP14 + ANION GAP
ALT: 54 IU/L — ABNORMAL HIGH (ref 0–44)
AST: 77 IU/L — ABNORMAL HIGH (ref 0–40)
Albumin/Globulin Ratio: 1.6 (ref 1.2–2.2)
Albumin: 4.1 g/dL (ref 3.7–4.7)
Alkaline Phosphatase: 56 IU/L (ref 44–121)
Anion Gap: 16 mmol/L (ref 10.0–18.0)
BUN/Creatinine Ratio: 12 (ref 10–24)
BUN: 12 mg/dL (ref 8–27)
Bilirubin Total: 0.3 mg/dL (ref 0.0–1.2)
CO2: 22 mmol/L (ref 20–29)
Calcium: 8.7 mg/dL (ref 8.6–10.2)
Chloride: 98 mmol/L (ref 96–106)
Creatinine, Ser: 1 mg/dL (ref 0.76–1.27)
Globulin, Total: 2.6 g/dL (ref 1.5–4.5)
Glucose: 86 mg/dL (ref 70–99)
Potassium: 3.7 mmol/L (ref 3.5–5.2)
Sodium: 136 mmol/L (ref 134–144)
Total Protein: 6.7 g/dL (ref 6.0–8.5)
eGFR: 77 mL/min/{1.73_m2} (ref >59)

## 2020-10-31 LAB — PSA: Prostate Specific Ag, Serum: 21.5 ng/mL — ABNORMAL HIGH (ref 0.0–4.0)

## 2020-10-31 NOTE — Assessment & Plan Note (Addendum)
ADDENDUM: Patient noted to have AST/ALT elevation 77/54 on CMP obtained at this visit. No prior elevations noted. He is overall asymptomatic.   Plan: RUQ Korea ordered Repeat CMP and Hepatitis labs at next visit

## 2020-10-31 NOTE — Addendum Note (Signed)
Addended by: Harvie Heck on: 10/31/2020 06:03 PM   Modules accepted: Orders

## 2020-11-01 NOTE — Progress Notes (Signed)
Internal Medicine Clinic Attending ° °Case discussed with Dr. Aslam  At the time of the visit.  We reviewed the resident’s history and exam and pertinent patient test results.  I agree with the assessment, diagnosis, and plan of care documented in the resident’s note.  °

## 2020-11-14 ENCOUNTER — Ambulatory Visit (HOSPITAL_COMMUNITY)
Admission: RE | Admit: 2020-11-14 | Discharge: 2020-11-14 | Disposition: A | Discharge status: Home / Self Care | Payer: Medicare HMO | Source: Ambulatory Visit | Attending: Internal Medicine | Admitting: Internal Medicine

## 2020-11-14 ENCOUNTER — Other Ambulatory Visit: Payer: Self-pay

## 2020-11-14 DIAGNOSIS — R7989 Other specified abnormal findings of blood chemistry: Secondary | ICD-10-CM | POA: Diagnosis not present

## 2020-11-14 DIAGNOSIS — N281 Cyst of kidney, acquired: Secondary | ICD-10-CM | POA: Diagnosis not present

## 2020-11-20 ENCOUNTER — Telehealth: Payer: Self-pay | Admitting: Internal Medicine

## 2020-11-20 NOTE — Telephone Encounter (Signed)
Dr. Marva Panda, I do not see a my chart message regarding his [place] results, but it looks like the son is calling about this.    You saw this patient last and the Korea is ordered under your name.  Were you trying to contact the patient? Thanks, [employer]

## 2020-11-20 NOTE — Telephone Encounter (Signed)
Patient' son calling back in reference to a My chart Message about the patient Korea results and having a biopsy performed.  Please call the patient back.

## 2020-11-23 NOTE — Telephone Encounter (Signed)
Attempted to call patient's son on 10/25; however, unable to reach. Called again on 10/27 and left VM.  If he calls back, please let him know that the RUQ [place] appears normal. No further work up is required at this time. However, we are awaiting the referral to [facility] that was sent on 10/4. OV notes have been faxed to them.   Thank you!

## 2020-12-26 DIAGNOSIS — R972 Elevated prostate specific antigen [PSA]: Secondary | ICD-10-CM | POA: Diagnosis not present

## 2020-12-26 DIAGNOSIS — N401 Enlarged prostate with lower urinary tract symptoms: Secondary | ICD-10-CM | POA: Diagnosis not present

## 2020-12-26 DIAGNOSIS — R351 Nocturia: Secondary | ICD-10-CM | POA: Diagnosis not present

## 2020-12-26 DIAGNOSIS — N3943 Post-void dribbling: Secondary | ICD-10-CM | POA: Diagnosis not present

## 2021-01-16 DIAGNOSIS — R110 Nausea: Secondary | ICD-10-CM | POA: Diagnosis not present

## 2021-01-28 DIAGNOSIS — C61 Malignant neoplasm of prostate: Secondary | ICD-10-CM

## 2021-01-28 HISTORY — DX: Malignant neoplasm of prostate: C61

## 2021-02-09 DIAGNOSIS — C61 Malignant neoplasm of prostate: Secondary | ICD-10-CM | POA: Diagnosis not present

## 2021-02-09 DIAGNOSIS — R972 Elevated prostate specific antigen [PSA]: Secondary | ICD-10-CM | POA: Diagnosis not present

## 2021-02-15 DIAGNOSIS — C61 Malignant neoplasm of prostate: Secondary | ICD-10-CM | POA: Insufficient documentation

## 2021-02-20 ENCOUNTER — Other Ambulatory Visit (HOSPITAL_COMMUNITY): Payer: Self-pay | Admitting: Urology

## 2021-02-20 ENCOUNTER — Ambulatory Visit (HOSPITAL_COMMUNITY): Admission: RE | Admit: 2021-02-20 | Payer: Medicare HMO | Source: Ambulatory Visit

## 2021-02-20 DIAGNOSIS — C61 Malignant neoplasm of prostate: Secondary | ICD-10-CM

## 2021-02-27 ENCOUNTER — Encounter: Payer: Self-pay | Admitting: Internal Medicine

## 2021-02-28 ENCOUNTER — Other Ambulatory Visit: Payer: Self-pay

## 2021-02-28 ENCOUNTER — Ambulatory Visit (HOSPITAL_COMMUNITY)
Admission: RE | Admit: 2021-02-28 | Discharge: 2021-02-28 | Disposition: A | Discharge status: Home / Self Care | Payer: Medicare HMO | Source: Ambulatory Visit | Attending: Urology | Admitting: Urology

## 2021-02-28 DIAGNOSIS — C61 Malignant neoplasm of prostate: Secondary | ICD-10-CM | POA: Insufficient documentation

## 2021-02-28 MED ORDER — PIFLIFOLASTAT F 18 (PYLARIFY) INJECTION
9.0000 | Freq: Once | INTRAVENOUS | Status: AC
  Administered 2021-02-28: 9.8 via INTRAVENOUS

## 2021-03-12 NOTE — Progress Notes (Signed)
GU Location of Tumor / Histology: Prostate Ca  If Prostate Cancer, Gleason Score is (4 + 3) and PSA is (21.5 as of 10/2020)  Biopsies  Dr. Gloriann Loan     Past/Anticipated interventions by urology, if any:   Weight changes, if any:  No is stable.  IPSS:   12 SHIM:  17  Bowel/Bladder complaints, if any:  No bowel or bladder.  Nausea/Vomiting, if any: No  Pain issues, if any:  0/10  SAFETY ISSUES: Prior radiation? No Pacemaker/ICD? No Possible current pregnancy?  Male Is the patient on methotrexate? No  Current Complaints / other details:  Need more information on treatment options.

## 2021-03-13 ENCOUNTER — Other Ambulatory Visit: Payer: Self-pay

## 2021-03-13 ENCOUNTER — Ambulatory Visit
Admission: RE | Admit: 2021-03-13 | Discharge: 2021-03-13 | Disposition: A | Discharge status: Home / Self Care | Payer: Medicare HMO | Source: Ambulatory Visit | Attending: Radiation Oncology | Admitting: Radiation Oncology

## 2021-03-13 VITALS — BP 153/89 | HR 74 | Temp 97.1°F | Resp 18 | Ht 71.0 in | Wt 191.5 lb

## 2021-03-13 DIAGNOSIS — J45909 Unspecified asthma, uncomplicated: Secondary | ICD-10-CM | POA: Diagnosis not present

## 2021-03-13 DIAGNOSIS — Z7982 Long term (current) use of aspirin: Secondary | ICD-10-CM | POA: Insufficient documentation

## 2021-03-13 DIAGNOSIS — C61 Malignant neoplasm of prostate: Secondary | ICD-10-CM | POA: Insufficient documentation

## 2021-03-13 DIAGNOSIS — Z87891 Personal history of nicotine dependence: Secondary | ICD-10-CM | POA: Diagnosis not present

## 2021-03-13 DIAGNOSIS — M1990 Unspecified osteoarthritis, unspecified site: Secondary | ICD-10-CM | POA: Insufficient documentation

## 2021-03-13 DIAGNOSIS — I10 Essential (primary) hypertension: Secondary | ICD-10-CM | POA: Insufficient documentation

## 2021-03-13 DIAGNOSIS — Z8719 Personal history of other diseases of the digestive system: Secondary | ICD-10-CM | POA: Insufficient documentation

## 2021-03-13 DIAGNOSIS — E7800 Pure hypercholesterolemia, unspecified: Secondary | ICD-10-CM | POA: Diagnosis not present

## 2021-03-13 DIAGNOSIS — Z86711 Personal history of pulmonary embolism: Secondary | ICD-10-CM | POA: Diagnosis not present

## 2021-03-13 DIAGNOSIS — Z79899 Other long term (current) drug therapy: Secondary | ICD-10-CM | POA: Diagnosis not present

## 2021-03-13 DIAGNOSIS — R972 Elevated prostate specific antigen [PSA]: Secondary | ICD-10-CM | POA: Diagnosis not present
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
## 2021-03-13 NOTE — Progress Notes (Signed)
Introduced myself to the patient as the prostate nurse navigator.  No barriers to care identified at this time.  He is here to discuss his radiation treatment options.  I gave him my business card and asked him to call me with questions or concerns.  Verbalized understanding.  ?

## 2021-03-14 ENCOUNTER — Telehealth: Payer: Self-pay | Admitting: *Deleted

## 2021-03-14 NOTE — Telephone Encounter (Signed)
CALLED PATIENT TO INFORM OF ADT APPT. ON 03/23/21 - ARRIVAL TIME- 10 AM @ DR. BELL'S OFFICE, SPOKE WITH PATIENT'S SON- KENNETH SUMMERS AND HE IS AWARE OF THIS APPT.

## 2021-03-16 DIAGNOSIS — C61 Malignant neoplasm of prostate: Secondary | ICD-10-CM | POA: Diagnosis not present

## 2021-03-16 DIAGNOSIS — Z191 Hormone sensitive malignancy status: Secondary | ICD-10-CM | POA: Diagnosis not present

## 2021-03-22 ENCOUNTER — Encounter: Payer: Medicare HMO | Admitting: Internal Medicine

## 2021-03-23 DIAGNOSIS — C61 Malignant neoplasm of prostate: Secondary | ICD-10-CM | POA: Diagnosis not present

## 2021-04-10 ENCOUNTER — Telehealth: Payer: Self-pay | Admitting: *Deleted

## 2021-04-10 ENCOUNTER — Other Ambulatory Visit: Payer: Self-pay | Admitting: Urology

## 2021-04-10 NOTE — Telephone Encounter (Signed)
CALLED PATIENT TO INFORM OF FID. MARKERS AND SPACE OAR PLACEMENT ON 06-13-21 AND HIS SIM ON 06-15-21 @ [facility], SPOKE WITH PATIENT'S SON- KENNETH SUMMERS AND HE IS AWARE OF THESE APPTS. ?

## 2021-04-13 DIAGNOSIS — C61 Malignant neoplasm of prostate: Secondary | ICD-10-CM | POA: Diagnosis not present

## 2021-04-23 ENCOUNTER — Other Ambulatory Visit: Payer: Self-pay | Admitting: Internal Medicine

## 2021-04-23 ENCOUNTER — Encounter: Payer: Self-pay | Admitting: Internal Medicine

## 2021-04-23 DIAGNOSIS — L602 Onychogryphosis: Secondary | ICD-10-CM

## 2021-04-26 ENCOUNTER — Other Ambulatory Visit: Payer: Self-pay | Admitting: Internal Medicine

## 2021-04-26 DIAGNOSIS — C61 Malignant neoplasm of prostate: Secondary | ICD-10-CM | POA: Diagnosis not present

## 2021-04-26 DIAGNOSIS — M21619 Bunion of unspecified foot: Secondary | ICD-10-CM

## 2021-04-26 DIAGNOSIS — L602 Onychogryphosis: Secondary | ICD-10-CM

## 2021-05-09 ENCOUNTER — Ambulatory Visit (INDEPENDENT_AMBULATORY_CARE_PROVIDER_SITE_OTHER): Payer: Medicare HMO

## 2021-05-09 ENCOUNTER — Encounter: Payer: Self-pay | Admitting: Podiatry

## 2021-05-09 ENCOUNTER — Ambulatory Visit (INDEPENDENT_AMBULATORY_CARE_PROVIDER_SITE_OTHER): Payer: Medicare HMO | Admitting: Podiatry

## 2021-05-09 DIAGNOSIS — M2042 Other hammer toe(s) (acquired), left foot: Secondary | ICD-10-CM | POA: Diagnosis not present

## 2021-05-09 DIAGNOSIS — M79675 Pain in left toe(s): Secondary | ICD-10-CM

## 2021-05-09 DIAGNOSIS — B351 Tinea unguium: Secondary | ICD-10-CM

## 2021-05-09 DIAGNOSIS — M21612 Bunion of left foot: Secondary | ICD-10-CM

## 2021-05-09 DIAGNOSIS — M2041 Other hammer toe(s) (acquired), right foot: Secondary | ICD-10-CM

## 2021-05-09 DIAGNOSIS — M21611 Bunion of right foot: Secondary | ICD-10-CM

## 2021-05-09 DIAGNOSIS — M79674 Pain in right toe(s): Secondary | ICD-10-CM | POA: Diagnosis not present

## 2021-05-09 NOTE — Progress Notes (Signed)
Subjective:  ? ?Patient ID: [name], male   DOB: 81 y.o.   MRN: [id number]  ? ?HPI ?Patient presents with caregiver with significant foot structural issues of both feet and thick tape toenails which become sore and are possible for him to cut..  Patient does not smoke and likes to be active if possible ? ? ?Review of Systems  ?All other systems reviewed and are negative. ? ? ?   ?Objective:  ?Physical Exam ?Vitals and nursing note reviewed.  ?Constitutional:   ?   Appearance: He is well-developed.  ?Pulmonary:  ?   Effort: Pulmonary effort is normal.  ?Musculoskeletal:     ?   General: Normal range of motion.  ?Skin: ?   General: Skin is warm.  ?Neurological:  ?   Mental Status: He is alert.  ?  ?Neurovascular status intact muscle strength found to be adequate range of motion adequate with patient found to have slight diminishment sharp dull vibratory.  Severe foot structural issues with large bunion deformity of both feet elevated lesser digit rigid contracture of the lesser digits and thick yellow brittle nailbeds 1-5 both feet that are painful.  Patient has good digital perfusion well oriented ? ?   ?Assessment:  ?Significant foot structural issues with hammertoe deformity and bunion deformity bilateral and mycotic nail infection with pain 1-5 both feet ? ?   ?Plan:  ?H&P all conditions reviewed and I do not recommend surgical correction but I do recommend mesh materials wide toe boxes and I debrided nailbeds 1-5 today with no iatrogenic bleeding which can be done routinely and will be seen back for other issues as needed and I did educate him on the difficulty of structural bunion digital correction in his case ? ?X-rays indicate severe bunion deformity bilateral with rigid contracture of digits 2 bilateral and lesser digit contracture ?   ? ? ?

## 2021-06-08 ENCOUNTER — Encounter (HOSPITAL_BASED_OUTPATIENT_CLINIC_OR_DEPARTMENT_OTHER): Payer: Self-pay | Admitting: Urology

## 2021-06-08 ENCOUNTER — Other Ambulatory Visit: Payer: Self-pay

## 2021-06-08 NOTE — Progress Notes (Signed)
Spoke w/ via phone for pre-op interview--- pt's son, [name] ?Lab needs dos----   istat            ?Lab results------ current ekg in epic/ chart ?COVID test -----patient states asymptomatic no test needed ?Arrive at ------- 0930 on 06-13-2021 ?NPO after MN NO Solid Food.  Clear liquids from MN until--- 0830 ?Med rec completed ?Medications to take morning of surgery ----- none ?Diabetic medication ----- n/a ?Patient instructed no nail polish to be worn day of surgery ?Patient instructed to bring photo id and insurance card day of surgery ?Patient aware to have Driver (ride ) / caregiver for 24 hours after surgery -- son, [name] ?Patient Special Instructions ----- pt will do one fleet enema night before surgery ?Pre-Op special Istructions ----- pt's son, [name], will need to be with pt in pre-op ?Patient's son verbalized understanding of instructions that were given at this phone interview. ?Patient's son stated father denies shortness of breath, chest pain, fever, cough at this phone interview.  ?

## 2021-06-13 ENCOUNTER — Ambulatory Visit (HOSPITAL_BASED_OUTPATIENT_CLINIC_OR_DEPARTMENT_OTHER): Payer: Medicare HMO | Admitting: Anesthesiology

## 2021-06-13 ENCOUNTER — Encounter (HOSPITAL_BASED_OUTPATIENT_CLINIC_OR_DEPARTMENT_OTHER): Payer: Self-pay | Admitting: Urology

## 2021-06-13 ENCOUNTER — Other Ambulatory Visit: Payer: Self-pay

## 2021-06-13 ENCOUNTER — Encounter (HOSPITAL_BASED_OUTPATIENT_CLINIC_OR_DEPARTMENT_OTHER)
Admission: RE | Disposition: A | Discharge status: Home / Self Care | Payer: Self-pay | Source: Home / Self Care | Attending: Urology

## 2021-06-13 ENCOUNTER — Ambulatory Visit (HOSPITAL_BASED_OUTPATIENT_CLINIC_OR_DEPARTMENT_OTHER)
Admission: RE | Admit: 2021-06-13 | Discharge: 2021-06-13 | Disposition: A | Discharge status: Home / Self Care | Payer: Medicare HMO | Attending: Urology | Admitting: Urology

## 2021-06-13 DIAGNOSIS — I10 Essential (primary) hypertension: Secondary | ICD-10-CM | POA: Diagnosis not present

## 2021-06-13 DIAGNOSIS — I2699 Other pulmonary embolism without acute cor pulmonale: Secondary | ICD-10-CM | POA: Diagnosis not present

## 2021-06-13 DIAGNOSIS — M1990 Unspecified osteoarthritis, unspecified site: Secondary | ICD-10-CM

## 2021-06-13 DIAGNOSIS — C61 Malignant neoplasm of prostate: Secondary | ICD-10-CM | POA: Insufficient documentation

## 2021-06-13 DIAGNOSIS — Z01818 Encounter for other preprocedural examination: Secondary | ICD-10-CM

## 2021-06-13 DIAGNOSIS — N289 Disorder of kidney and ureter, unspecified: Secondary | ICD-10-CM

## 2021-06-13 DIAGNOSIS — Z86711 Personal history of pulmonary embolism: Secondary | ICD-10-CM | POA: Insufficient documentation

## 2021-06-13 HISTORY — PX: SPACE OAR INSTILLATION: SHX6769

## 2021-06-13 HISTORY — DX: Chronic gout, unspecified, without tophus (tophi): M1A.9XX0

## 2021-06-13 HISTORY — DX: Unspecified osteoarthritis, unspecified site: M19.90

## 2021-06-13 HISTORY — DX: Mild intermittent asthma, uncomplicated: J45.20

## 2021-06-13 HISTORY — DX: Personal history of other diseases of the digestive system: Z87.19

## 2021-06-13 HISTORY — DX: Complete loss of teeth, unspecified cause, unspecified class: K08.109

## 2021-06-13 HISTORY — DX: Hyperlipidemia, unspecified: E78.5

## 2021-06-13 HISTORY — DX: Benign prostatic hyperplasia with lower urinary tract symptoms: N40.1

## 2021-06-13 HISTORY — DX: Presence of spectacles and contact lenses: Z97.3

## 2021-06-13 HISTORY — PX: GOLD SEED IMPLANT: SHX6343

## 2021-06-13 LAB — POCT I-STAT, CHEM 8
BUN: 15 mg/dL (ref 8–23)
Calcium, Ion: 1.3 mmol/L (ref 1.15–1.40)
Chloride: 99 mmol/L (ref 98–111)
Creatinine, Ser: 1.1 mg/dL (ref 0.61–1.24)
Glucose, Bld: 84 mg/dL (ref 70–99)
HCT: 40 % (ref 39.0–52.0)
Hemoglobin: 13.6 g/dL (ref 13.0–17.0)
Potassium: 3.6 mmol/L (ref 3.5–5.1)
Sodium: 140 mmol/L (ref 135–145)
TCO2: 29 mmol/L (ref 22–32)

## 2021-06-13 SURGERY — INSERTION, GOLD SEEDS
Anesthesia: Monitor Anesthesia Care | Site: Prostate

## 2021-06-13 MED ORDER — OXYCODONE HCL 5 MG/5ML PO SOLN: 5.0000 mg | Freq: Once | ORAL | Status: DC | PRN

## 2021-06-13 MED ORDER — FENTANYL CITRATE (PF) 100 MCG/2ML IJ SOLN
INTRAMUSCULAR | Status: AC
  Filled 2021-06-13: qty 2

## 2021-06-13 MED ORDER — ACETAMINOPHEN 500 MG PO TABS
1000.0000 mg | ORAL_TABLET | Freq: Once | ORAL | Status: AC
Start: 2021-06-13 — End: 2021-06-13
  Administered 2021-06-13: 1000 mg via ORAL

## 2021-06-13 MED ORDER — FENTANYL CITRATE (PF) 100 MCG/2ML IJ SOLN: 25.0000 ug | INTRAMUSCULAR | Status: DC | PRN

## 2021-06-13 MED ORDER — CEFAZOLIN SODIUM-DEXTROSE 2-4 GM/100ML-% IV SOLN
2.0000 g | INTRAVENOUS | Status: AC
  Administered 2021-06-13: 2 g via INTRAVENOUS

## 2021-06-13 MED ORDER — OXYCODONE HCL 5 MG PO TABS: 5.0000 mg | ORAL_TABLET | Freq: Once | ORAL | Status: DC | PRN

## 2021-06-13 MED ORDER — LIDOCAINE HCL (CARDIAC) PF 100 MG/5ML IV SOSY
PREFILLED_SYRINGE | INTRAVENOUS | Status: DC | PRN
Start: 2021-06-13 — End: 2021-06-13
  Administered 2021-06-13: 40 mg via INTRAVENOUS

## 2021-06-13 MED ORDER — ACETAMINOPHEN 500 MG PO TABS
ORAL_TABLET | ORAL | Status: AC
  Filled 2021-06-13: qty 2

## 2021-06-13 MED ORDER — ONDANSETRON HCL 4 MG/2ML IJ SOLN
INTRAMUSCULAR | Status: AC
  Filled 2021-06-13: qty 2

## 2021-06-13 MED ORDER — CEFAZOLIN SODIUM-DEXTROSE 2-4 GM/100ML-% IV SOLN
INTRAVENOUS | Status: AC
  Filled 2021-06-13: qty 100

## 2021-06-13 MED ORDER — PROPOFOL 500 MG/50ML IV EMUL
INTRAVENOUS | Status: DC | PRN
  Administered 2021-06-13: 75 ug/kg/min via INTRAVENOUS

## 2021-06-13 MED ORDER — ONDANSETRON HCL 4 MG/2ML IJ SOLN
INTRAMUSCULAR | Status: DC | PRN
Start: 2021-06-13 — End: 2021-06-13
  Administered 2021-06-13: 4 mg via INTRAVENOUS

## 2021-06-13 MED ORDER — FLEET ENEMA 7-19 GM/118ML RE ENEM: 1.0000 | ENEMA | Freq: Once | RECTAL | Status: DC

## 2021-06-13 MED ORDER — FENTANYL CITRATE (PF) 100 MCG/2ML IJ SOLN
INTRAMUSCULAR | Status: DC | PRN
  Administered 2021-06-13 (×2): 25 ug via INTRAVENOUS

## 2021-06-13 MED ORDER — LACTATED RINGERS IV SOLN: INTRAVENOUS | Status: DC

## 2021-06-13 MED ORDER — PROPOFOL 500 MG/50ML IV EMUL
INTRAVENOUS | Status: AC
  Filled 2021-06-13: qty 50

## 2021-06-13 MED ORDER — SODIUM CHLORIDE (PF) 0.9 % IJ SOLN
INTRAMUSCULAR | Status: DC | PRN
  Administered 2021-06-13: 10 mL

## 2021-06-13 MED ORDER — BUPIVACAINE HCL (PF) 0.25 % IJ SOLN
INTRAMUSCULAR | Status: DC | PRN
  Administered 2021-06-13: 10 mL

## 2021-06-13 MED ORDER — EPHEDRINE SULFATE (PRESSORS) 50 MG/ML IJ SOLN
INTRAMUSCULAR | Status: DC | PRN
  Administered 2021-06-13 (×2): 10 mg via INTRAVENOUS

## 2021-06-13 MED ORDER — PROPOFOL 10 MG/ML IV BOLUS
INTRAVENOUS | Status: DC | PRN
  Administered 2021-06-13: 30 mg via INTRAVENOUS

## 2021-06-13 SURGICAL SUPPLY — 26 items
BLADE CLIPPER SENSICLIP SURGIC (BLADE) ×3 IMPLANT
CNTNR URN SCR LID CUP LEK RST (MISCELLANEOUS) ×2 IMPLANT
CONT SPEC 4OZ STRL OR WHT (MISCELLANEOUS) ×3
COVER BACK TABLE 60X90IN (DRAPES) ×3 IMPLANT
DRSG TEGADERM 4X4.75 (GAUZE/BANDAGES/DRESSINGS) ×3 IMPLANT
DRSG TEGADERM 8X12 (GAUZE/BANDAGES/DRESSINGS) ×3 IMPLANT
GAUZE SPONGE 4X4 12PLY STRL (GAUZE/BANDAGES/DRESSINGS) ×3 IMPLANT
GAUZE SPONGE 4X4 12PLY STRL LF (GAUZE/BANDAGES/DRESSINGS) ×1 IMPLANT
GLOVE BIO SURGEON STRL SZ7.5 (GLOVE) ×3 IMPLANT
GLOVE ECLIPSE 8.0 STRL XLNG CF (GLOVE) ×3 IMPLANT
GLOVE SURG ORTHO 8.5 STRL (GLOVE) ×3 IMPLANT
IMPL SPACEOAR VUE SYSTEM (Spacer) ×2 IMPLANT
IMPLANT SPACEOAR VUE SYSTEM (Spacer) ×3 IMPLANT
KIT TURNOVER CYSTO (KITS) ×3 IMPLANT
MARKER GOLD PRELOAD 1.2X3 (Urological Implant) ×2 IMPLANT
MARKER SKIN DUAL TIP RULER LAB (MISCELLANEOUS) ×3 IMPLANT
NDL SPNL 22GX3.5 QUINCKE BK (NEEDLE) IMPLANT
NEEDLE SPNL 22GX3.5 QUINCKE BK (NEEDLE) ×3 IMPLANT
SEED GOLD PRELOAD 1.2X3 (Urological Implant) ×3 IMPLANT
SHEATH ULTRASOUND LF (SHEATH) IMPLANT
SHEATH ULTRASOUND LTX NONSTRL (SHEATH) IMPLANT
SURGILUBE 2OZ TUBE FLIPTOP (MISCELLANEOUS) ×3 IMPLANT
SYR 10ML LL (SYRINGE) ×1 IMPLANT
SYR CONTROL 10ML LL (SYRINGE) ×3 IMPLANT
TOWEL OR 17X26 10 PK STRL BLUE (TOWEL DISPOSABLE) ×3 IMPLANT
UNDERPAD 30X36 HEAVY ABSORB (UNDERPADS AND DIAPERS) ×3 IMPLANT

## 2021-06-13 NOTE — H&P (Signed)
H&P ? ?Chief Complaint: Prostate cancer ? ?History of Present Illness: 81 year old male with prostate cancer presents for marker placement and SpaceOAR. ? ?Past Medical History:  ?Diagnosis Date  ? Benign localized prostatic hyperplasia with lower urinary tract symptoms (LUTS)   ? Chronic gout   ? followed by pcp  ? Depression   ? Full dentures   ? Herpes infection 09/14/2010  ? Genital and oral, HSV 1 and 2 by antibody test.   ? History of colitis 2005  ? per colonoscopy chronic inflammation colits secondary to ascending colon ulceration  ? History of diverticulitis of colon   ? History of DVT of lower extremity 09/13/2006  ? LLE  ? History of pulmonary embolism 09/13/2006  ? bilateral PE secondary to DVT of LLE in 2008 On Warfarin until 01/2010 when he was admitted for syncope and noticed that he had completed course of warfarin and it was stopped.  ? History of syncope 02/10/2010  ? Suspect neurocardiogenic.  Always with standing or micturation.  Had bradycardic/hypotensive response with standing when hospitalized in 1/12.  On Florinef.  Echo (1/12): EF 60-65%, mild aortic root dilation.   ? HTN (hypertension)   ? followed by pcp   (05-12-2023no medication since 01/ 2023 by pt's pcp)  ? Hyperlipidemia   ? Malignant neoplasm prostate [facility]) 01/2021  ? urologist-- dr Abcde Oneil;  dx 01/ 2023, Gleason 4+4  ? Mild intermittent asthma   ? PFT's 05/2012 : FEV1 / FVC 75 and 78 pre and post BD. FEV1 74 and 75 pre and post BD. TLC 72. RV 93. DLCO 79. Read as restrictive lung disease, possible with obstructive lung disease. Prior smoker.    ? OA (osteoarthritis)   ? knees  ? Wears glasses   ? ?Past Surgical History:  ?Procedure Laterality Date  ? I & D EXTREMITY Right 03/04/2014  ? Procedure: IRRIGATION AND DEBRIDEMENT FINGER / HAND;  Surgeon: Dayna Barker, MD;  Location: [name];  Service: Plastics;  Laterality: Right;  I&D Right Small Finger and Right Wrist  ? KNEE ARTHROSCOPY Left 2000  ? TRANSURETHRAL RESECTION OF PROSTATE   04/01/2003  ? '@[facility]'$   by dr [name]  ? ? ?Home Medications:  ?Medications Prior to Admission  ?Medication Sig Dispense Refill Last Dose  ? albuterol (PROVENTIL HFA) 108 (90 Base) MCG/ACT inhaler Inhale 1-2 puffs into the lungs every 6 (six) hours as needed for wheezing or shortness of breath. (Patient not taking: Reported on 06/08/2021) 18 g 4 Not Taking  ? aspirin EC 81 MG tablet Take 81 mg by mouth daily.  (Patient not taking: Reported on 03/13/2021)     ? cephALEXin (KEFLEX) 500 MG capsule Take 1 capsule (500 mg total) by mouth 4 (four) times daily. (Patient not taking: Reported on 03/13/2021) 28 capsule 0   ? colchicine 0.6 MG tablet Take 1 tablet (0.6 mg total) by mouth daily. (Patient not taking: Reported on 03/13/2021) 90 tablet 3   ? latanoprost (XALATAN) 0.005 % ophthalmic solution 1 drop at bedtime. (Patient not taking: Reported on 03/13/2021)     ? lisinopril-hydrochlorothiazide (ZESTORETIC) 20-12.5 MG tablet Take 1 tablet by mouth daily. (Patient not taking: Reported on 03/13/2021) 90 tablet 3   ? ondansetron (ZOFRAN ODT) 4 MG disintegrating tablet '4mg'$  ODT q4 hours prn nausea/vomit (Patient not taking: Reported on 03/13/2021) 10 tablet 0   ? ?Allergies: No Known Allergies ? ?Family History  ?Problem Relation Age of Onset  ? Heart disease Father   ? Stroke Father   ?  Heart disease Brother 79  ? ?Social History:  reports that he has never smoked. He has never used smokeless tobacco. He reports that he does not drink alcohol and does not use drugs. ? ?ROS: ?A complete review of systems was performed.  All systems are negative except for pertinent findings as noted. ?ROS ? ? ?Physical Exam:  ?Vital signs in last 24 hours: ?Temp:  [98.6 ?F (37 ?C)] 98.6 ?F (37 ?C) (05/17 0934) ?Pulse Rate:  [69] 69 (05/17 0934) ?Resp:  [17] 17 (05/17 0934) ?BP: (136)/(75) 136/75 (05/17 0934) ?SpO2:  [99 %] 99 % (05/17 0934) ?Weight:  [88.5 kg] 88.5 kg (05/17 0934) ?General:  Alert and oriented, No acute distress ?HEENT: Normocephalic,  atraumatic ?Neck: No JVD or lymphadenopathy ?Cardiovascular: Regular rate and rhythm ?Lungs: Regular rate and effort ?Abdomen: Soft, nontender, nondistended, no abdominal masses ?Back: No CVA tenderness ?Extremities: No edema ?Neurologic: Grossly intact ? ?Laboratory Data:  ?No results found for this or any previous visit (from the past 24 hour(s)). ?No results found for this or any previous visit (from the past 240 hour(s)). ?Creatinine: ?No results for input(s): CREATININE in the last 168 hours. ? ?Impression/Assessment:  ?Prostate cancer ? ?Plan:  ?Proceed with marker placement and SpaceOAR.  Risk and benefits discussed including but not limited to bleeding, infection, injury to surrounding structures, need for additional procedures, development of rectal ulceration, rectal violation, severe rectal pain or discomfort. ? ?Marton Redwood, III ?06/13/2021, 9:43 AM  ? ?

## 2021-06-13 NOTE — Transfer of Care (Addendum)
Immediate Anesthesia Transfer of Care Note ? ?Patient: [name] ? ?Procedure(s) Performed: GOLD SEED IMPLANT (Prostate) ?SPACE OAR INSTILLATION (Perineum) ? ?Patient Location: PACU ? ?Anesthesia Type:MAC ? ?Level of Consciousness: sedated ? ?Airway & Oxygen Therapy: Patient Spontanous Breathing and Patient connected to face mask oxygen ? ?Post-op Assessment: Report given to RN and Post -op Vital signs reviewed and stable ? ?Post vital signs: Reviewed and stable ? ?Last Vitals:  ?Vitals Value Taken Time  ?BP 115/69 06/13/21 1230  ?Temp 36.1 ?C 06/13/21 1137  ?Pulse 55 06/13/21 1237  ?Resp 13 06/13/21 1237  ?SpO2 95 % 06/13/21 1237  ?Vitals shown include unvalidated device data. ? ?Last Pain:  ?Vitals:  ? 06/13/21 1215  ?TempSrc:   ?PainSc: 0-No pain  ?   ? ?Patients Stated Pain Goal: 5 (06/13/21 1137) ? ?Complications: No notable events documented. ?

## 2021-06-13 NOTE — Anesthesia Postprocedure Evaluation (Signed)
Anesthesia Post Note ? ?Patient: [name] [name] ? ?Procedure(s) Performed: GOLD SEED IMPLANT (Prostate) ?SPACE OAR INSTILLATION (Perineum) ? ?  ? ?Patient location during evaluation: PACU ?Anesthesia Type: MAC ?Level of consciousness: awake and alert ?Pain management: pain level controlled ?Vital Signs Assessment: post-procedure vital signs reviewed and stable ?Respiratory status: spontaneous breathing, nonlabored ventilation and respiratory function stable ?Cardiovascular status: blood pressure returned to baseline ?Postop Assessment: no apparent nausea or vomiting ?Anesthetic complications: no ? ? ?No notable events documented. ? ?Last Vitals:  ?Vitals:  ? 06/13/21 1245 06/13/21 1300  ?BP: 126/70 129/72  ?Pulse: (!) 55 (!) 54  ?Resp: 10 16  ?Temp:    ?SpO2: 95% 99%  ?  ?Last Pain:  ?Vitals:  ? 06/13/21 1215  ?TempSrc:   ?PainSc: 0-No pain  ? ? ?  ?  ?  ?  ?  ?  ? ?Marthenia Rolling ? ? ? ? ?

## 2021-06-13 NOTE — Discharge Instructions (Signed)
NO TYLENOL PRODUCTS UNTIL 4:15 PM TODAY. ? ? ? ? ?Post Anesthesia Home Care Instructions ? ?Activity: ?Get plenty of rest for the remainder of the day. A responsible individual must stay with you for 24 hours following the procedure.  ?For the next 24 hours, DO NOT: ?-Drive a car ?-Paediatric nurse ?-Drink alcoholic beverages ?-Take any medication unless instructed by your physician ?-Make any legal decisions or sign important papers. ? ?Meals: ?Start with liquid foods such as gelatin or soup. Progress to regular foods as tolerated. Avoid greasy, spicy, heavy foods. If nausea and/or vomiting occur, drink only clear liquids until the nausea and/or vomiting subsides. Call your physician if vomiting continues. ? ?Special Instructions/Symptoms: ?Your throat may feel dry or sore from the anesthesia or the breathing tube placed in your throat during surgery. If this causes discomfort, gargle with warm salt water. The discomfort should disappear within 24 hours. ? ?    ?

## 2021-06-13 NOTE — Anesthesia Preprocedure Evaluation (Addendum)
Anesthesia Evaluation  ?Patient identified by MRN, date of birth, ID band ?Patient awake ? ? ? ?Reviewed: ?Allergy & Precautions, NPO status , Patient's Chart, lab work & pertinent test results ? ?History of Anesthesia Complications ?Negative for: history of anesthetic complications ? ?Airway ?Mallampati: II ? ?TM Distance: >3 FB ?Neck ROM: Full ? ? ? Dental ? ?(+) Lower Dentures, Upper Dentures ?  ?Pulmonary ?asthma , PE (2008) ?  ?Pulmonary exam normal ? ? ? ? ? ? ? Cardiovascular ?hypertension, Pt. on medications ?Normal cardiovascular exam ? ? ?  ?Neuro/Psych ?Depression negative neurological ROS ?   ? GI/Hepatic ?negative GI ROS, Neg liver ROS,   ?Endo/Other  ?negative endocrine ROS ? Renal/GU ?Renal InsufficiencyRenal disease  ? ?  ?Musculoskeletal ? ?(+) Arthritis ,  ? Abdominal ?  ?Peds ? Hematology ?negative hematology ROS ?(+)   ?Anesthesia Other Findings ?Prostate ca ? Reproductive/Obstetrics ? ?  ? ? ? ? ? ? ? ? ? ? ? ? ? ?  ?  ? ? ? ? ? ? ? ?Anesthesia Physical ?Anesthesia Plan ? ?ASA: 2 ? ?Anesthesia Plan: MAC  ? ?Post-op Pain Management: Tylenol PO (pre-op)*  ? ?Induction:  ? ?PONV Risk Score and Plan: 1 and Treatment may vary due to age or medical condition and Propofol infusion ? ?Airway Management Planned: Natural Airway and Simple Face Mask ? ?Additional Equipment: None ? ?Intra-op Plan:  ? ?Post-operative Plan:  ? ?Informed Consent: I have reviewed the patients History and Physical, chart, labs and discussed the procedure including the risks, benefits and alternatives for the proposed anesthesia with the patient or authorized representative who has indicated his/her understanding and acceptance.  ? ? ? ?Consent reviewed with POA ? ?Plan Discussed with: CRNA ? ?Anesthesia Plan Comments: (Consent reviewed with patient and his son present in preop. All questions answered. Daiva Huge, MD)  ? ? ? ? ? ?Anesthesia Quick Evaluation ? ?

## 2021-06-13 NOTE — Op Note (Signed)
Preoperative diagnosis: Clinically localized adenocarcinoma of the prostate  ? ?Postoperative diagnosis: Clinically localized adenocarcinoma of the prostate ? ?Procedure: 1) Placement of fiducial markers into prostate ?                   2) Insertion of SpaceOAR hydrogel  ? ?Surgeon: Link Snuffer, M.D. ? ?Anesthesia: MAC sedation ? ?EBL: Minimal ? ?Complications: None ? ?Indication: [name] is a 81 y.o. gentleman with clinically localized prostate cancer. After discussing management options for treatment, he elected to proceed with radiotherapy. He presents today for the above procedures. The potential risks, complications, alternative options, and expected recovery course have been discussed in detail with the patient and he has provided informed consent to proceed. ? ?Description of procedure: The patient was administered preoperative antibiotics, placed in the dorsal lithotomy position, and prepped and draped in the usual sterile fashion. Next, transrectal ultrasonography was utilized to visualize the prostate.  The perineum was anesthetized with quarter percent Marcaine. Three gold fiducial markers were then placed into the prostate via transperineal needles under ultrasound guidance at the right apex, right base, and left mid gland under direct ultrasound guidance. A site in the midline was then selected on the perineum for placement of an 18 g needle with saline. The needle was advanced above the rectum and below Denonvillier's fascia to the mid gland and confirmed to be in the midline on transverse imaging. One cc of saline was injected confirming appropriate expansion of this space. A total of 5 cc of saline was then injected to open the space further bilaterally. The saline syringe was then removed and the SpaceOAR hydrogel was injected with good distribution bilaterally. He tolerated the procedure well and without complications. He was given a voiding trial prior to discharge from the PACU. ? ?

## 2021-06-14 ENCOUNTER — Encounter (HOSPITAL_BASED_OUTPATIENT_CLINIC_OR_DEPARTMENT_OTHER): Payer: Self-pay | Admitting: Urology

## 2021-06-14 ENCOUNTER — Telehealth: Payer: Self-pay | Admitting: *Deleted

## 2021-06-14 NOTE — Telephone Encounter (Signed)
CALLED PATIENT TO REMIND OF SIM APPT. FOR 06-15-21- ARRIVAL TIME- 9:45 AM @ [facility], INFORMED PATIENT TO ARRIVE WITH A FULL BLADDER AND AN EMPTY BOWEL, SPOKE WITH PATIENT'S DAUGHTER- [name] AND SHE IS AWARE OF THIS APPT. AND THE INSTRUCTIONS

## 2021-06-15 ENCOUNTER — Ambulatory Visit
Admission: RE | Admit: 2021-06-15 | Discharge: 2021-06-15 | Disposition: A | Discharge status: Home / Self Care | Payer: Medicare HMO | Source: Ambulatory Visit | Attending: Radiation Oncology | Admitting: Radiation Oncology

## 2021-06-15 ENCOUNTER — Other Ambulatory Visit: Payer: Self-pay

## 2021-06-15 DIAGNOSIS — Z510 Encounter for antineoplastic radiation therapy: Secondary | ICD-10-CM | POA: Insufficient documentation

## 2021-06-15 DIAGNOSIS — C61 Malignant neoplasm of prostate: Secondary | ICD-10-CM | POA: Diagnosis not present

## 2021-06-15 DIAGNOSIS — Z191 Hormone sensitive malignancy status: Secondary | ICD-10-CM | POA: Diagnosis not present

## 2021-06-15 NOTE — Progress Notes (Signed)
  Radiation Oncology         [phone] ________________________________  Name: [name] MRN: [id number]  Date: 06/15/2021  DOB: Oct 12, 1940  SIMULATION AND TREATMENT PLANNING NOTE    ICD-10-CM   1. Malignant neoplasm of prostate ([place])  C61       DIAGNOSIS:   81y.o. gentleman with Stage T2a adenocarcinoma of the prostate with Gleason score of 4+4, and PSA of 25.4.  NARRATIVE:  The patient was brought to the [employer].  Identity was confirmed.  All relevant records and images related to the planned course of therapy were reviewed.  The patient freely provided informed written consent to proceed with treatment after reviewing the details related to the planned course of therapy. The consent form was witnessed and verified by the simulation staff.  Then, the patient was set-up in a stable reproducible supine position for radiation therapy.  A vacuum lock pillow device was custom fabricated to position his legs in a reproducible immobilized position.  Then, I performed a urethrogram under sterile conditions to identify the prostatic apex.  CT images were obtained.  Surface markings were placed.  The CT images were loaded into the planning software.  Then the prostate target and avoidance structures including the rectum, bladder, bowel and hips were contoured.  Treatment planning then occurred.  The radiation prescription was entered and confirmed.  A total of one complex treatment devices was fabricated. I have requested : Intensity Modulated Radiotherapy (IMRT) is medically necessary for this case for the following reason:  Rectal sparing.Marland Kitchen  PLAN:  The prostate, seminal vesicles, and pelvic lymph nodes will initially be treated to 45 Gy in 25 fractions of 1.8 Gy followed by a boost to the prostate only, to 75 Gy with 15 additional fractions of 2.0 Gy.   ________________________________  Sheral Apley Tammi Klippel, M.D.

## 2021-06-18 DIAGNOSIS — Z510 Encounter for antineoplastic radiation therapy: Secondary | ICD-10-CM | POA: Diagnosis not present

## 2021-06-18 DIAGNOSIS — Z191 Hormone sensitive malignancy status: Secondary | ICD-10-CM | POA: Diagnosis not present

## 2021-06-18 DIAGNOSIS — C61 Malignant neoplasm of prostate: Secondary | ICD-10-CM | POA: Diagnosis not present

## 2021-06-28 ENCOUNTER — Ambulatory Visit
Admission: RE | Admit: 2021-06-28 | Discharge: 2021-06-28 | Disposition: A | Discharge status: Home / Self Care | Payer: Medicare HMO | Source: Ambulatory Visit | Attending: Radiation Oncology | Admitting: Radiation Oncology

## 2021-06-28 ENCOUNTER — Other Ambulatory Visit: Payer: Self-pay

## 2021-06-28 DIAGNOSIS — Z191 Hormone sensitive malignancy status: Secondary | ICD-10-CM | POA: Diagnosis not present

## 2021-06-28 DIAGNOSIS — Z510 Encounter for antineoplastic radiation therapy: Secondary | ICD-10-CM | POA: Diagnosis not present

## 2021-06-28 DIAGNOSIS — C61 Malignant neoplasm of prostate: Secondary | ICD-10-CM | POA: Diagnosis not present

## 2021-06-28 LAB — RAD ONC ARIA SESSION SUMMARY
Course Elapsed Days: 0
Plan Fractions Treated to Date: 1
Plan Prescribed Dose Per Fraction: 1.8 Gy
Plan Total Fractions Prescribed: 25
Plan Total Prescribed Dose: 45 Gy
Reference Point Dosage Given to Date: 1.8 Gy
Reference Point Session Dosage Given: 1.8 Gy
Session Number: 1

## 2021-06-29 ENCOUNTER — Ambulatory Visit
Admission: RE | Admit: 2021-06-29 | Discharge: 2021-06-29 | Disposition: A | Discharge status: Home / Self Care | Payer: Medicare HMO | Source: Ambulatory Visit | Attending: Radiation Oncology | Admitting: Radiation Oncology

## 2021-06-29 ENCOUNTER — Other Ambulatory Visit: Payer: Self-pay

## 2021-06-29 DIAGNOSIS — Z510 Encounter for antineoplastic radiation therapy: Secondary | ICD-10-CM | POA: Diagnosis not present

## 2021-06-29 DIAGNOSIS — Z191 Hormone sensitive malignancy status: Secondary | ICD-10-CM | POA: Diagnosis not present

## 2021-06-29 DIAGNOSIS — C61 Malignant neoplasm of prostate: Secondary | ICD-10-CM | POA: Diagnosis not present

## 2021-06-29 LAB — RAD ONC ARIA SESSION SUMMARY
Course Elapsed Days: 1
Plan Fractions Treated to Date: 2
Plan Prescribed Dose Per Fraction: 1.8 Gy
Plan Total Fractions Prescribed: 25
Plan Total Prescribed Dose: 45 Gy
Reference Point Dosage Given to Date: 3.6 Gy
Reference Point Session Dosage Given: 1.8 Gy
Session Number: 2

## 2021-07-02 ENCOUNTER — Ambulatory Visit
Admission: RE | Admit: 2021-07-02 | Discharge: 2021-07-02 | Disposition: A | Discharge status: Home / Self Care | Payer: Medicare HMO | Source: Ambulatory Visit | Attending: Radiation Oncology | Admitting: Radiation Oncology

## 2021-07-02 ENCOUNTER — Other Ambulatory Visit: Payer: Self-pay

## 2021-07-02 DIAGNOSIS — C61 Malignant neoplasm of prostate: Secondary | ICD-10-CM | POA: Diagnosis not present

## 2021-07-02 DIAGNOSIS — Z191 Hormone sensitive malignancy status: Secondary | ICD-10-CM | POA: Diagnosis not present

## 2021-07-02 DIAGNOSIS — Z510 Encounter for antineoplastic radiation therapy: Secondary | ICD-10-CM | POA: Diagnosis not present

## 2021-07-02 LAB — RAD ONC ARIA SESSION SUMMARY
Course Elapsed Days: 4
Plan Fractions Treated to Date: 3
Plan Prescribed Dose Per Fraction: 1.8 Gy
Plan Total Fractions Prescribed: 25
Plan Total Prescribed Dose: 45 Gy
Reference Point Dosage Given to Date: 5.4 Gy
Reference Point Session Dosage Given: 1.8 Gy
Session Number: 3

## 2021-07-03 ENCOUNTER — Ambulatory Visit
Admission: RE | Admit: 2021-07-03 | Discharge: 2021-07-03 | Disposition: A | Discharge status: Home / Self Care | Payer: Medicare HMO | Source: Ambulatory Visit | Attending: Radiation Oncology | Admitting: Radiation Oncology

## 2021-07-03 ENCOUNTER — Other Ambulatory Visit: Payer: Self-pay

## 2021-07-03 DIAGNOSIS — Z510 Encounter for antineoplastic radiation therapy: Secondary | ICD-10-CM | POA: Diagnosis not present

## 2021-07-03 DIAGNOSIS — Z191 Hormone sensitive malignancy status: Secondary | ICD-10-CM | POA: Diagnosis not present

## 2021-07-03 DIAGNOSIS — C61 Malignant neoplasm of prostate: Secondary | ICD-10-CM | POA: Diagnosis not present

## 2021-07-03 LAB — RAD ONC ARIA SESSION SUMMARY
Course Elapsed Days: 5
Plan Fractions Treated to Date: 4
Plan Prescribed Dose Per Fraction: 1.8 Gy
Plan Total Fractions Prescribed: 25
Plan Total Prescribed Dose: 45 Gy
Reference Point Dosage Given to Date: 7.2 Gy
Reference Point Session Dosage Given: 1.8 Gy
Session Number: 4

## 2021-07-04 ENCOUNTER — Ambulatory Visit
Admission: RE | Admit: 2021-07-04 | Discharge: 2021-07-04 | Disposition: A | Discharge status: Home / Self Care | Payer: Medicare HMO | Source: Ambulatory Visit | Attending: Radiation Oncology | Admitting: Radiation Oncology

## 2021-07-04 ENCOUNTER — Other Ambulatory Visit: Payer: Self-pay

## 2021-07-04 DIAGNOSIS — C61 Malignant neoplasm of prostate: Secondary | ICD-10-CM | POA: Diagnosis not present

## 2021-07-04 DIAGNOSIS — Z191 Hormone sensitive malignancy status: Secondary | ICD-10-CM | POA: Diagnosis not present

## 2021-07-04 DIAGNOSIS — Z510 Encounter for antineoplastic radiation therapy: Secondary | ICD-10-CM | POA: Diagnosis not present

## 2021-07-04 LAB — RAD ONC ARIA SESSION SUMMARY
Course Elapsed Days: 6
Plan Fractions Treated to Date: 5
Plan Prescribed Dose Per Fraction: 1.8 Gy
Plan Total Fractions Prescribed: 25
Plan Total Prescribed Dose: 45 Gy
Reference Point Dosage Given to Date: 9 Gy
Reference Point Session Dosage Given: 1.8 Gy
Session Number: 5

## 2021-07-05 ENCOUNTER — Other Ambulatory Visit: Payer: Self-pay

## 2021-07-05 ENCOUNTER — Ambulatory Visit
Admission: RE | Admit: 2021-07-05 | Discharge: 2021-07-05 | Disposition: A | Discharge status: Home / Self Care | Payer: Medicare HMO | Source: Ambulatory Visit | Attending: Radiation Oncology | Admitting: Radiation Oncology

## 2021-07-05 DIAGNOSIS — Z510 Encounter for antineoplastic radiation therapy: Secondary | ICD-10-CM | POA: Diagnosis not present

## 2021-07-05 DIAGNOSIS — Z191 Hormone sensitive malignancy status: Secondary | ICD-10-CM | POA: Diagnosis not present

## 2021-07-05 DIAGNOSIS — C61 Malignant neoplasm of prostate: Secondary | ICD-10-CM | POA: Diagnosis not present

## 2021-07-05 LAB — RAD ONC ARIA SESSION SUMMARY
Course Elapsed Days: 7
Plan Fractions Treated to Date: 6
Plan Prescribed Dose Per Fraction: 1.8 Gy
Plan Total Fractions Prescribed: 25
Plan Total Prescribed Dose: 45 Gy
Reference Point Dosage Given to Date: 10.8 Gy
Reference Point Session Dosage Given: 1.8 Gy
Session Number: 6

## 2021-07-06 ENCOUNTER — Ambulatory Visit
Admission: RE | Admit: 2021-07-06 | Discharge: 2021-07-06 | Disposition: A | Discharge status: Home / Self Care | Payer: Medicare HMO | Source: Ambulatory Visit | Attending: Radiation Oncology | Admitting: Radiation Oncology

## 2021-07-06 ENCOUNTER — Other Ambulatory Visit: Payer: Self-pay

## 2021-07-06 DIAGNOSIS — Z191 Hormone sensitive malignancy status: Secondary | ICD-10-CM | POA: Diagnosis not present

## 2021-07-06 DIAGNOSIS — Z510 Encounter for antineoplastic radiation therapy: Secondary | ICD-10-CM | POA: Diagnosis not present

## 2021-07-06 DIAGNOSIS — C61 Malignant neoplasm of prostate: Secondary | ICD-10-CM | POA: Diagnosis not present

## 2021-07-06 LAB — RAD ONC ARIA SESSION SUMMARY
Course Elapsed Days: 8
Plan Fractions Treated to Date: 7
Plan Prescribed Dose Per Fraction: 1.8 Gy
Plan Total Fractions Prescribed: 25
Plan Total Prescribed Dose: 45 Gy
Reference Point Dosage Given to Date: 12.6 Gy
Reference Point Session Dosage Given: 1.8 Gy
Session Number: 7

## 2021-07-09 ENCOUNTER — Ambulatory Visit
Admission: RE | Admit: 2021-07-09 | Discharge: 2021-07-09 | Disposition: A | Discharge status: Home / Self Care | Payer: Medicare HMO | Source: Ambulatory Visit | Attending: Radiation Oncology | Admitting: Radiation Oncology

## 2021-07-09 ENCOUNTER — Other Ambulatory Visit: Payer: Self-pay

## 2021-07-09 DIAGNOSIS — Z510 Encounter for antineoplastic radiation therapy: Secondary | ICD-10-CM | POA: Diagnosis not present

## 2021-07-09 DIAGNOSIS — Z191 Hormone sensitive malignancy status: Secondary | ICD-10-CM | POA: Diagnosis not present

## 2021-07-09 DIAGNOSIS — C61 Malignant neoplasm of prostate: Secondary | ICD-10-CM | POA: Diagnosis not present

## 2021-07-09 LAB — RAD ONC ARIA SESSION SUMMARY
Course Elapsed Days: 11
Plan Fractions Treated to Date: 8
Plan Prescribed Dose Per Fraction: 1.8 Gy
Plan Total Fractions Prescribed: 25
Plan Total Prescribed Dose: 45 Gy
Reference Point Dosage Given to Date: 14.4 Gy
Reference Point Session Dosage Given: 1.8 Gy
Session Number: 8

## 2021-07-10 ENCOUNTER — Ambulatory Visit
Admission: RE | Admit: 2021-07-10 | Discharge: 2021-07-10 | Disposition: A | Discharge status: Home / Self Care | Payer: Medicare HMO | Source: Ambulatory Visit | Attending: Radiation Oncology | Admitting: Radiation Oncology

## 2021-07-10 ENCOUNTER — Other Ambulatory Visit: Payer: Self-pay

## 2021-07-10 DIAGNOSIS — C61 Malignant neoplasm of prostate: Secondary | ICD-10-CM | POA: Diagnosis not present

## 2021-07-10 DIAGNOSIS — Z191 Hormone sensitive malignancy status: Secondary | ICD-10-CM | POA: Diagnosis not present

## 2021-07-10 DIAGNOSIS — Z510 Encounter for antineoplastic radiation therapy: Secondary | ICD-10-CM | POA: Diagnosis not present

## 2021-07-10 LAB — RAD ONC ARIA SESSION SUMMARY
Course Elapsed Days: 12
Plan Fractions Treated to Date: 9
Plan Prescribed Dose Per Fraction: 1.8 Gy
Plan Total Fractions Prescribed: 25
Plan Total Prescribed Dose: 45 Gy
Reference Point Dosage Given to Date: 16.2 Gy
Reference Point Session Dosage Given: 1.8 Gy
Session Number: 9

## 2021-07-11 ENCOUNTER — Other Ambulatory Visit: Payer: Self-pay

## 2021-07-11 ENCOUNTER — Ambulatory Visit
Admission: RE | Admit: 2021-07-11 | Discharge: 2021-07-11 | Disposition: A | Discharge status: Home / Self Care | Payer: Medicare HMO | Source: Ambulatory Visit | Attending: Radiation Oncology | Admitting: Radiation Oncology

## 2021-07-11 DIAGNOSIS — Z191 Hormone sensitive malignancy status: Secondary | ICD-10-CM | POA: Diagnosis not present

## 2021-07-11 DIAGNOSIS — Z510 Encounter for antineoplastic radiation therapy: Secondary | ICD-10-CM | POA: Diagnosis not present

## 2021-07-11 DIAGNOSIS — C61 Malignant neoplasm of prostate: Secondary | ICD-10-CM | POA: Diagnosis not present

## 2021-07-11 LAB — RAD ONC ARIA SESSION SUMMARY
Course Elapsed Days: 13
Plan Fractions Treated to Date: 10
Plan Prescribed Dose Per Fraction: 1.8 Gy
Plan Total Fractions Prescribed: 25
Plan Total Prescribed Dose: 45 Gy
Reference Point Dosage Given to Date: 18 Gy
Reference Point Session Dosage Given: 1.8 Gy
Session Number: 10

## 2021-07-12 ENCOUNTER — Ambulatory Visit
Admission: RE | Admit: 2021-07-12 | Discharge: 2021-07-12 | Disposition: A | Discharge status: Home / Self Care | Payer: Medicare HMO | Source: Ambulatory Visit | Attending: Radiation Oncology | Admitting: Radiation Oncology

## 2021-07-12 ENCOUNTER — Other Ambulatory Visit: Payer: Self-pay

## 2021-07-12 DIAGNOSIS — Z191 Hormone sensitive malignancy status: Secondary | ICD-10-CM | POA: Diagnosis not present

## 2021-07-12 DIAGNOSIS — Z510 Encounter for antineoplastic radiation therapy: Secondary | ICD-10-CM | POA: Diagnosis not present

## 2021-07-12 DIAGNOSIS — C61 Malignant neoplasm of prostate: Secondary | ICD-10-CM | POA: Diagnosis not present

## 2021-07-12 LAB — RAD ONC ARIA SESSION SUMMARY
Course Elapsed Days: 14
Plan Fractions Treated to Date: 11
Plan Prescribed Dose Per Fraction: 1.8 Gy
Plan Total Fractions Prescribed: 25
Plan Total Prescribed Dose: 45 Gy
Reference Point Dosage Given to Date: 19.8 Gy
Reference Point Session Dosage Given: 1.8 Gy
Session Number: 11

## 2021-07-13 ENCOUNTER — Other Ambulatory Visit: Payer: Self-pay

## 2021-07-13 ENCOUNTER — Ambulatory Visit
Admission: RE | Admit: 2021-07-13 | Discharge: 2021-07-13 | Disposition: A | Discharge status: Home / Self Care | Payer: Medicare HMO | Source: Ambulatory Visit | Attending: Radiation Oncology | Admitting: Radiation Oncology

## 2021-07-13 DIAGNOSIS — Z510 Encounter for antineoplastic radiation therapy: Secondary | ICD-10-CM | POA: Diagnosis not present

## 2021-07-13 DIAGNOSIS — Z191 Hormone sensitive malignancy status: Secondary | ICD-10-CM | POA: Diagnosis not present

## 2021-07-13 DIAGNOSIS — C61 Malignant neoplasm of prostate: Secondary | ICD-10-CM | POA: Diagnosis not present

## 2021-07-13 LAB — RAD ONC ARIA SESSION SUMMARY
Course Elapsed Days: 15
Plan Fractions Treated to Date: 12
Plan Prescribed Dose Per Fraction: 1.8 Gy
Plan Total Fractions Prescribed: 25
Plan Total Prescribed Dose: 45 Gy
Reference Point Dosage Given to Date: 21.6 Gy
Reference Point Session Dosage Given: 1.8 Gy
Session Number: 12

## 2021-07-16 ENCOUNTER — Emergency Department (HOSPITAL_COMMUNITY): Payer: Medicare HMO

## 2021-07-16 ENCOUNTER — Ambulatory Visit: Payer: Medicare HMO

## 2021-07-16 ENCOUNTER — Inpatient Hospital Stay (HOSPITAL_COMMUNITY): Payer: Medicare HMO

## 2021-07-16 ENCOUNTER — Encounter (HOSPITAL_COMMUNITY): Payer: Self-pay | Admitting: Radiology

## 2021-07-16 ENCOUNTER — Inpatient Hospital Stay (HOSPITAL_COMMUNITY)
Admission: EM | Admit: 2021-07-16 | Discharge: 2021-07-19 | DRG: 872 | Disposition: A | Discharge status: Home / Self Care | Payer: Medicare HMO | Attending: Internal Medicine | Admitting: Internal Medicine

## 2021-07-16 DIAGNOSIS — Z79899 Other long term (current) drug therapy: Secondary | ICD-10-CM | POA: Diagnosis not present

## 2021-07-16 DIAGNOSIS — K6389 Other specified diseases of intestine: Secondary | ICD-10-CM | POA: Diagnosis not present

## 2021-07-16 DIAGNOSIS — Y929 Unspecified place or not applicable: Secondary | ICD-10-CM | POA: Diagnosis not present

## 2021-07-16 DIAGNOSIS — E876 Hypokalemia: Secondary | ICD-10-CM | POA: Diagnosis not present

## 2021-07-16 DIAGNOSIS — R55 Syncope and collapse: Secondary | ICD-10-CM

## 2021-07-16 DIAGNOSIS — H409 Unspecified glaucoma: Secondary | ICD-10-CM | POA: Diagnosis present

## 2021-07-16 DIAGNOSIS — R7989 Other specified abnormal findings of blood chemistry: Secondary | ICD-10-CM | POA: Diagnosis present

## 2021-07-16 DIAGNOSIS — Y842 Radiological procedure and radiotherapy as the cause of abnormal reaction of the patient, or of later complication, without mention of misadventure at the time of the procedure: Secondary | ICD-10-CM | POA: Diagnosis present

## 2021-07-16 DIAGNOSIS — S0990XA Unspecified injury of head, initial encounter: Secondary | ICD-10-CM | POA: Diagnosis not present

## 2021-07-16 DIAGNOSIS — Z20822 Contact with and (suspected) exposure to covid-19: Secondary | ICD-10-CM | POA: Diagnosis not present

## 2021-07-16 DIAGNOSIS — N400 Enlarged prostate without lower urinary tract symptoms: Secondary | ICD-10-CM | POA: Diagnosis present

## 2021-07-16 DIAGNOSIS — Z86711 Personal history of pulmonary embolism: Secondary | ICD-10-CM | POA: Diagnosis not present

## 2021-07-16 DIAGNOSIS — J4520 Mild intermittent asthma, uncomplicated: Secondary | ICD-10-CM | POA: Diagnosis not present

## 2021-07-16 DIAGNOSIS — K627 Radiation proctitis: Secondary | ICD-10-CM | POA: Diagnosis present

## 2021-07-16 DIAGNOSIS — K6289 Other specified diseases of anus and rectum: Secondary | ICD-10-CM | POA: Diagnosis not present

## 2021-07-16 DIAGNOSIS — D62 Acute posthemorrhagic anemia: Secondary | ICD-10-CM | POA: Diagnosis not present

## 2021-07-16 DIAGNOSIS — R600 Localized edema: Secondary | ICD-10-CM | POA: Diagnosis not present

## 2021-07-16 DIAGNOSIS — Z823 Family history of stroke: Secondary | ICD-10-CM | POA: Diagnosis not present

## 2021-07-16 DIAGNOSIS — Z510 Encounter for antineoplastic radiation therapy: Secondary | ICD-10-CM | POA: Diagnosis not present

## 2021-07-16 DIAGNOSIS — I700 Atherosclerosis of aorta: Secondary | ICD-10-CM | POA: Diagnosis present

## 2021-07-16 DIAGNOSIS — C61 Malignant neoplasm of prostate: Secondary | ICD-10-CM | POA: Diagnosis not present

## 2021-07-16 DIAGNOSIS — Z191 Hormone sensitive malignancy status: Secondary | ICD-10-CM | POA: Diagnosis not present

## 2021-07-16 DIAGNOSIS — Z7982 Long term (current) use of aspirin: Secondary | ICD-10-CM | POA: Diagnosis not present

## 2021-07-16 DIAGNOSIS — I10 Essential (primary) hypertension: Secondary | ICD-10-CM | POA: Diagnosis not present

## 2021-07-16 DIAGNOSIS — M1A9XX0 Chronic gout, unspecified, without tophus (tophi): Secondary | ICD-10-CM | POA: Diagnosis present

## 2021-07-16 DIAGNOSIS — A419 Sepsis, unspecified organism: Secondary | ICD-10-CM | POA: Diagnosis not present

## 2021-07-16 DIAGNOSIS — Z8249 Family history of ischemic heart disease and other diseases of the circulatory system: Secondary | ICD-10-CM | POA: Diagnosis not present

## 2021-07-16 DIAGNOSIS — R531 Weakness: Secondary | ICD-10-CM | POA: Diagnosis not present

## 2021-07-16 DIAGNOSIS — E785 Hyperlipidemia, unspecified: Secondary | ICD-10-CM | POA: Diagnosis present

## 2021-07-16 DIAGNOSIS — I739 Peripheral vascular disease, unspecified: Secondary | ICD-10-CM | POA: Diagnosis not present

## 2021-07-16 DIAGNOSIS — K921 Melena: Secondary | ICD-10-CM | POA: Diagnosis not present

## 2021-07-16 DIAGNOSIS — Z86718 Personal history of other venous thrombosis and embolism: Secondary | ICD-10-CM

## 2021-07-16 DIAGNOSIS — I77810 Thoracic aortic ectasia: Secondary | ICD-10-CM | POA: Diagnosis not present

## 2021-07-16 DIAGNOSIS — K922 Gastrointestinal hemorrhage, unspecified: Secondary | ICD-10-CM | POA: Diagnosis present

## 2021-07-16 DIAGNOSIS — F32A Depression, unspecified: Secondary | ICD-10-CM | POA: Diagnosis present

## 2021-07-16 DIAGNOSIS — G319 Degenerative disease of nervous system, unspecified: Secondary | ICD-10-CM | POA: Diagnosis not present

## 2021-07-16 DIAGNOSIS — Z87891 Personal history of nicotine dependence: Secondary | ICD-10-CM

## 2021-07-16 DIAGNOSIS — D649 Anemia, unspecified: Secondary | ICD-10-CM

## 2021-07-16 DIAGNOSIS — Z8546 Personal history of malignant neoplasm of prostate: Secondary | ICD-10-CM | POA: Diagnosis not present

## 2021-07-16 DIAGNOSIS — R933 Abnormal findings on diagnostic imaging of other parts of digestive tract: Secondary | ICD-10-CM | POA: Diagnosis not present

## 2021-07-16 DIAGNOSIS — M47814 Spondylosis without myelopathy or radiculopathy, thoracic region: Secondary | ICD-10-CM | POA: Diagnosis not present

## 2021-07-16 DIAGNOSIS — I959 Hypotension, unspecified: Secondary | ICD-10-CM | POA: Diagnosis not present

## 2021-07-16 DIAGNOSIS — R509 Fever, unspecified: Secondary | ICD-10-CM | POA: Diagnosis not present

## 2021-07-16 HISTORY — DX: Acute posthemorrhagic anemia: D62

## 2021-07-16 HISTORY — DX: Syncope and collapse: R55

## 2021-07-16 LAB — CBC WITH DIFFERENTIAL/PLATELET
Abs Immature Granulocytes: 0.02 10*3/uL (ref 0.00–0.07)
Basophils Absolute: 0 10*3/uL (ref 0.0–0.1)
Basophils Relative: 1 %
Eosinophils Absolute: 0.1 10*3/uL (ref 0.0–0.5)
Eosinophils Relative: 2 %
HCT: 27.1 % — ABNORMAL LOW (ref 39.0–52.0)
Hemoglobin: 8.8 g/dL — ABNORMAL LOW (ref 13.0–17.0)
Immature Granulocytes: 0 %
Lymphocytes Relative: 19 %
Lymphs Abs: 0.8 10*3/uL (ref 0.7–4.0)
MCH: 28.7 pg (ref 26.0–34.0)
MCHC: 32.5 g/dL (ref 30.0–36.0)
MCV: 88.3 fL (ref 80.0–100.0)
Monocytes Absolute: 0.5 10*3/uL (ref 0.1–1.0)
Monocytes Relative: 11 %
Neutro Abs: 3 10*3/uL (ref 1.7–7.7)
Neutrophils Relative %: 67 %
Platelets: 114 10*3/uL — ABNORMAL LOW (ref 150–400)
RBC: 3.07 MIL/uL — ABNORMAL LOW (ref 4.22–5.81)
RDW: 13.6 % (ref 11.5–15.5)
WBC: 4.5 10*3/uL (ref 4.0–10.5)
nRBC: 0 % (ref 0.0–0.2)

## 2021-07-16 LAB — COMPREHENSIVE METABOLIC PANEL
ALT: 95 U/L — ABNORMAL HIGH (ref 0–44)
AST: 181 U/L — ABNORMAL HIGH (ref 15–41)
Albumin: 3.8 g/dL (ref 3.5–5.0)
Alkaline Phosphatase: 55 U/L (ref 38–126)
Anion gap: 10 (ref 5–15)
BUN: 17 mg/dL (ref 8–23)
CO2: 24 mmol/L (ref 22–32)
Calcium: 9 mg/dL (ref 8.9–10.3)
Chloride: 102 mmol/L (ref 98–111)
Creatinine, Ser: 1.19 mg/dL (ref 0.61–1.24)
GFR, Estimated: >60 mL/min (ref >60)
Glucose, Bld: 111 mg/dL — ABNORMAL HIGH (ref 70–99)
Potassium: 3.4 mmol/L — ABNORMAL LOW (ref 3.5–5.1)
Sodium: 136 mmol/L (ref 135–145)
Total Bilirubin: 0.5 mg/dL (ref 0.3–1.2)
Total Protein: 7.4 g/dL (ref 6.5–8.1)

## 2021-07-16 LAB — URINALYSIS, ROUTINE W REFLEX MICROSCOPIC
Bacteria, UA: NONE SEEN
Bilirubin Urine: NEGATIVE
Glucose, UA: NEGATIVE mg/dL
Ketones, ur: NEGATIVE mg/dL
Leukocytes,Ua: NEGATIVE
Nitrite: NEGATIVE
Protein, ur: NEGATIVE mg/dL
Specific Gravity, Urine: 1.021 (ref 1.005–1.030)
pH: 5 (ref 5.0–8.0)

## 2021-07-16 LAB — FERRITIN: Ferritin: 883 ng/mL — ABNORMAL HIGH (ref 24–336)

## 2021-07-16 LAB — ECHOCARDIOGRAM COMPLETE
AR max vel: 2.38 cm2
AV Area VTI: 2.05 cm2
AV Area mean vel: 2.06 cm2
AV Mean grad: 4 mmHg
AV Peak grad: 6.9 mmHg
Ao pk vel: 1.31 m/s
Area-P 1/2: 4.49 cm2
S' Lateral: 2.9 cm
Weight: 2896 oz

## 2021-07-16 LAB — PHOSPHORUS: Phosphorus: 3.6 mg/dL (ref 2.5–4.6)

## 2021-07-16 LAB — VITAMIN B12: Vitamin B-12: 277 pg/mL (ref 180–914)

## 2021-07-16 LAB — LACTIC ACID, PLASMA
Lactic Acid, Venous: 2.3 mmol/L (ref 0.5–1.9)
Lactic Acid, Venous: 2.8 mmol/L (ref 0.5–1.9)
Lactic Acid, Venous: 1.1 mmol/L (ref 0.5–1.9)

## 2021-07-16 LAB — ABO/RH: ABO/RH(D): B POS

## 2021-07-16 LAB — IRON AND TIBC
Iron: 40 ug/dL — ABNORMAL LOW (ref 45–182)
Saturation Ratios: 16 % — ABNORMAL LOW (ref 17.9–39.5)
TIBC: 247 ug/dL — ABNORMAL LOW (ref 250–450)
UIBC: 207 ug/dL

## 2021-07-16 LAB — RETICULOCYTES
Immature Retic Fract: 11.2 % (ref 2.3–15.9)
RBC.: 3.12 MIL/uL — ABNORMAL LOW (ref 4.22–5.81)
Retic Count, Absolute: 34 10*3/uL (ref 19.0–186.0)
Retic Ct Pct: 1.1 % (ref 0.4–3.1)

## 2021-07-16 LAB — HEMOGLOBIN AND HEMATOCRIT, BLOOD
HCT: 21.6 % — ABNORMAL LOW (ref 39.0–52.0)
Hemoglobin: 7.1 g/dL — ABNORMAL LOW (ref 13.0–17.0)

## 2021-07-16 LAB — TROPONIN I (HIGH SENSITIVITY)
Troponin I (High Sensitivity): 6 ng/L (ref <18)
Troponin I (High Sensitivity): 8 ng/L (ref <18)

## 2021-07-16 LAB — MAGNESIUM: Magnesium: 2 mg/dL (ref 1.7–2.4)

## 2021-07-16 LAB — RESP PANEL BY RT-PCR (FLU A&B, COVID) ARPGX2
Influenza A by PCR: NEGATIVE
Influenza B by PCR: NEGATIVE
SARS Coronavirus 2 by RT PCR: NEGATIVE

## 2021-07-16 LAB — PROTIME-INR
INR: 1.1 (ref 0.8–1.2)
Prothrombin Time: 14.2 seconds (ref 11.4–15.2)

## 2021-07-16 LAB — APTT: aPTT: 28 seconds (ref 24–36)

## 2021-07-16 LAB — POC OCCULT BLOOD, ED: Fecal Occult Bld: POSITIVE — AB

## 2021-07-16 LAB — FOLATE: Folate: 13 ng/mL (ref >5.9)

## 2021-07-16 LAB — PREPARE RBC (CROSSMATCH)

## 2021-07-16 MED ORDER — POTASSIUM CHLORIDE 10 MEQ/100ML IV SOLN
10.0000 meq | INTRAVENOUS | Status: AC
  Administered 2021-07-16 (×3): 10 meq via INTRAVENOUS
  Filled 2021-07-16 (×3): qty 100

## 2021-07-16 MED ORDER — ACETAMINOPHEN 500 MG PO TABS
1000.0000 mg | ORAL_TABLET | Freq: Once | ORAL | Status: AC
  Administered 2021-07-16: 1000 mg via ORAL
  Filled 2021-07-16: qty 2

## 2021-07-16 MED ORDER — IOHEXOL 350 MG/ML SOLN
100.0000 mL | Freq: Once | INTRAVENOUS | Status: AC | PRN
  Administered 2021-07-16: 100 mL via INTRAVENOUS

## 2021-07-16 MED ORDER — VANCOMYCIN HCL IN DEXTROSE 1-5 GM/200ML-% IV SOLN
1000.0000 mg | Freq: Once | INTRAVENOUS | Status: AC
  Administered 2021-07-16: 1000 mg via INTRAVENOUS
  Filled 2021-07-16: qty 200

## 2021-07-16 MED ORDER — LACTATED RINGERS IV SOLN: INTRAVENOUS | Status: DC

## 2021-07-16 MED ORDER — CEFEPIME HCL 2 G IV SOLR
2.0000 g | Freq: Two times a day (BID) | INTRAVENOUS | Status: DC
  Administered 2021-07-16 – 2021-07-18 (×4): 2 g via INTRAVENOUS
  Filled 2021-07-16 (×5): qty 12.5

## 2021-07-16 MED ORDER — SODIUM CHLORIDE 0.9 % IV SOLN
2.0000 g | Freq: Once | INTRAVENOUS | Status: AC
  Administered 2021-07-16: 2 g via INTRAVENOUS
  Filled 2021-07-16: qty 12.5

## 2021-07-16 MED ORDER — ONDANSETRON HCL 4 MG/2ML IJ SOLN: 4.0000 mg | Freq: Four times a day (QID) | INTRAMUSCULAR | Status: DC | PRN

## 2021-07-16 MED ORDER — SODIUM CHLORIDE 0.9 % IV BOLUS (SEPSIS)
1000.0000 mL | Freq: Once | INTRAVENOUS | Status: AC
  Administered 2021-07-16: 1000 mL via INTRAVENOUS

## 2021-07-16 MED ORDER — SODIUM CHLORIDE 0.9% FLUSH
3.0000 mL | Freq: Two times a day (BID) | INTRAVENOUS | Status: DC
Start: 2021-07-16 — End: 2021-07-19
  Administered 2021-07-16 – 2021-07-19 (×6): 3 mL via INTRAVENOUS

## 2021-07-16 MED ORDER — METRONIDAZOLE 500 MG/100ML IV SOLN
500.0000 mg | Freq: Once | INTRAVENOUS | Status: AC
  Administered 2021-07-16: 500 mg via INTRAVENOUS
  Filled 2021-07-16: qty 100

## 2021-07-16 MED ORDER — ACETAMINOPHEN 325 MG PO TABS: 650.0000 mg | ORAL_TABLET | Freq: Four times a day (QID) | ORAL | Status: DC | PRN

## 2021-07-16 MED ORDER — LORAZEPAM 2 MG/ML IJ SOLN: 0.5000 mg | INTRAMUSCULAR | Status: DC | PRN

## 2021-07-16 MED ORDER — GADOBUTROL 1 MMOL/ML IV SOLN
8.0000 mL | Freq: Once | INTRAVENOUS | Status: AC | PRN
  Administered 2021-07-16: 8 mL via INTRAVENOUS

## 2021-07-16 MED ORDER — ONDANSETRON HCL 4 MG PO TABS: 4.0000 mg | ORAL_TABLET | Freq: Four times a day (QID) | ORAL | Status: DC | PRN

## 2021-07-16 MED ORDER — VANCOMYCIN HCL 1500 MG/300ML IV SOLN
1500.0000 mg | INTRAVENOUS | Status: DC
  Administered 2021-07-16 – 2021-07-17 (×2): 1500 mg via INTRAVENOUS
  Filled 2021-07-16 (×2): qty 300

## 2021-07-16 MED ORDER — SODIUM CHLORIDE 0.9% IV SOLUTION: Freq: Once | INTRAVENOUS | Status: AC

## 2021-07-16 MED ORDER — SODIUM CHLORIDE 0.9 % IV BOLUS
500.0000 mL | Freq: Once | INTRAVENOUS | Status: AC
  Administered 2021-07-16: 500 mL via INTRAVENOUS

## 2021-07-16 MED ORDER — PANTOPRAZOLE SODIUM 40 MG IV SOLR
40.0000 mg | Freq: Two times a day (BID) | INTRAVENOUS | Status: DC
  Administered 2021-07-16 – 2021-07-18 (×6): 40 mg via INTRAVENOUS
  Filled 2021-07-16 (×6): qty 10

## 2021-07-16 MED ORDER — SODIUM CHLORIDE 0.9 % IV BOLUS
1000.0000 mL | Freq: Once | INTRAVENOUS | Status: AC
  Administered 2021-07-16: 1000 mL via INTRAVENOUS

## 2021-07-16 MED ORDER — METRONIDAZOLE 500 MG/100ML IV SOLN
500.0000 mg | Freq: Two times a day (BID) | INTRAVENOUS | Status: DC
  Administered 2021-07-16 – 2021-07-19 (×6): 500 mg via INTRAVENOUS
  Filled 2021-07-16 (×6): qty 100

## 2021-07-16 MED ORDER — ACETAMINOPHEN 650 MG RE SUPP: 650.0000 mg | Freq: Four times a day (QID) | RECTAL | Status: DC | PRN

## 2021-07-16 MED ORDER — LATANOPROST 0.005 % OP SOLN
1.0000 [drp] | Freq: Every day | OPHTHALMIC | Status: DC
  Administered 2021-07-16 – 2021-07-18 (×3): 1 [drp] via OPHTHALMIC
  Filled 2021-07-16: qty 2.5

## 2021-07-16 NOTE — ED Triage Notes (Signed)
Pt came in from home with c/o syncopal episode. He lost consciousness and loss continence of bowel and bladder. Pt has hx of prostate cancer. Recently started radiation. Pt having epigastric and umbilical abdominal pain. Temp 13foral

## 2021-07-16 NOTE — ED Provider Notes (Signed)
Emergency Department Provider Note   I have reviewed the triage vital signs and the nursing notes.   HISTORY  Chief Complaint Loss of Consciousness   HPI [name] is a 81 y.o. male with past history reviewed below including ongoing prostate cancer currently receiving radiation therapy presents to the emergency department with syncope at home and fever.  Patient lives at home with his son.  He states he got up to go the bathroom in the night.  He went to use the toilet and was walking back to bed when he suddenly felt lightheaded.  He states he must of passed out because he woke up on the ground and his son was checking on him.  He was apparently incontinent of bowel and bladder according to EMS.  No witnessed seizure activity.  Patient is complaining of a soreness in the right abdomen/chest area.  He is not feeling short of breath or having active chest discomfort.  States has been having intermittent headaches.    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    History of colitis 2005   per colonoscopy chronic inflammation colits secondary to ascending colon ulceration   History of diverticulitis of colon    History of DVT of lower extremity 09/13/2006   LLE   History of pulmonary embolism 09/13/2006   bilateral PE secondary to DVT of LLE in 2008 On Warfarin until 01/2010 when he was admitted for syncope and noticed that he had completed course of warfarin and it was stopped.   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  Always with standing or micturation.  Had bradycardic/hypotensive response with standing when hospitalized in 1/12.  On Florinef.  Echo (1/12): EF 60-65%, mild aortic root dilation.    HTN (hypertension)    followed by pcp    (05-12-2023no medication since 01/ 2023 by pt's pcp)   Hyperlipidemia    Malignant neoplasm prostate ([place]) 01/2021   urologist-- dr bell;  dx 01/ 2023, Gleason 4+4   Mild intermittent asthma    PFT's 05/2012 : FEV1 / FVC 75 and 78 pre and post BD. FEV1 74 and 75 pre and post BD. TLC 72. RV 93. DLCO 79. Read as restrictive lung disease, possible with obstructive lung disease. Prior smoker.     OA (osteoarthritis)    knees   Wears glasses     Review of Systems  Constitutional: Positive fever/chills Eyes: No visual changes. ENT: No sore throat. Cardiovascular: Denies chest pain. Positive syncope.  Respiratory: Denies shortness of breath. Gastrointestinal: Positive RUQ abdominal pain.  No nausea, no vomiting.  No diarrhea.  No constipation. Genitourinary: Negative for dysuria. Musculoskeletal: Negative for back pain. Skin: Negative for rash. Neurological: Negative for focal weakness or numbness. Positive intermittent HA recently.   ____________________________________________   PHYSICAL EXAM:  VITAL SIGNS: ED Triage Vitals  Enc Vitals Group     BP 07/16/21 0347 (!) 115/53     Pulse Rate 07/16/21 0346 88     Resp 07/16/21 0346 12     Temp 07/16/21 0346 (!) 101 F (38.3 C)     Temp Source 07/16/21 0346 Oral     SpO2 07/16/21 0346 100 %  Weight 07/16/21 0347 181 lb (82.1 kg)   Constitutional: Alert and oriented. No acute distress.  Eyes: Conjunctivae are normal. PERRL (3 mm).  Head: Atraumatic. Nose: No congestion/rhinnorhea. Mouth/Throat: Mucous membranes are moist.   Neck: No stridor.   Cardiovascular: Normal rate, regular rhythm. Good peripheral circulation. Grossly normal heart sounds.   Respiratory: Normal respiratory effort.  No retractions. Lungs CTAB. Gastrointestinal: Soft and nontender. No distention. Rectal exam performed with nurse tech chaperone. No gross blood or melena.  Musculoskeletal: No lower extremity tenderness nor edema. No gross deformities of  extremities. Neurologic:  Normal speech and language. No gross focal neurologic deficits are appreciated.  Skin:  Skin is warm, dry and intact. No rash noted.  ____________________________________________   LABS (all labs ordered are listed, but only abnormal results are displayed)  Labs Reviewed  LACTIC ACID, PLASMA - Abnormal; Notable for the following components:      Result Value   Lactic Acid, Venous 2.3 (*)    All other components within normal limits  LACTIC ACID, PLASMA - Abnormal; Notable for the following components:   Lactic Acid, Venous 2.8 (*)    All other components within normal limits  COMPREHENSIVE METABOLIC PANEL - Abnormal; Notable for the following components:   Potassium 3.4 (*)    Glucose, Bld 111 (*)    AST 181 (*)    ALT 95 (*)    All other components within normal limits  CBC WITH DIFFERENTIAL/PLATELET - Abnormal; Notable for the following components:   RBC 3.07 (*)    Hemoglobin 8.8 (*)    HCT 27.1 (*)    Platelets 114 (*)    All other components within normal limits  URINALYSIS, ROUTINE W REFLEX MICROSCOPIC - Abnormal; Notable for the following components:   Hgb urine dipstick LARGE (*)    All other components within normal limits  IRON AND TIBC - Abnormal; Notable for the following components:   Iron 40 (*)    TIBC 247 (*)    Saturation Ratios 16 (*)    All other components within normal limits  FERRITIN - Abnormal; Notable for the following components:   Ferritin 883 (*)    All other components within normal limits  RETICULOCYTES - Abnormal; Notable for the following components:   RBC. 3.12 (*)    All other components within normal limits  HEMOGLOBIN AND HEMATOCRIT, BLOOD - Abnormal; Notable for the following components:   Hemoglobin 7.1 (*)    HCT 21.6 (*)    All other components within normal limits  POC OCCULT BLOOD, ED - Abnormal; Notable for the following components:   Fecal Occult Bld POSITIVE (*)    All other components within  normal limits  RESP PANEL BY RT-PCR (FLU A&B, COVID) ARPGX2  CULTURE, BLOOD (ROUTINE X 2)  CULTURE, BLOOD (ROUTINE X 2)  URINE CULTURE  PROTIME-INR  APTT  VITAMIN B12  FOLATE  MAGNESIUM  PHOSPHORUS  LACTIC ACID, PLASMA  COMPREHENSIVE METABOLIC PANEL  CBC WITH DIFFERENTIAL/PLATELET  TYPE AND SCREEN  PREPARE RBC (CROSSMATCH)  ABO/RH  TROPONIN I (HIGH SENSITIVITY)  TROPONIN I (HIGH SENSITIVITY)   ____________________________________________  EKG   EKG Interpretation  Date/Time:  Monday July 16 2021 05:19:41 EDT Ventricular Rate:  89 PR Interval:  190 QRS Duration: 100 QT Interval:  341 QTC Calculation: 415 R Axis:   -13 Text Interpretation: Sinus rhythm Abnormal R-wave progression, early transition Left ventricular hypertrophy Confirmed by Nanda Quinton [phone]) on 07/16/2021 5:31:15 AM  ____________________________________________  RADIOLOGY  MR PELVIS W WO CONTRAST  Result Date: 07/16/2021 CLINICAL DATA:  An 81 year old male presents for evaluation of metastatic disease, reportedly with history of "SpaceOAR" placement. EXAM: MRI PELVIS WITHOUT AND WITH CONTRAST TECHNIQUE: Multiplanar multisequence MR imaging of the pelvis was performed both before and after administration of intravenous contrast. CONTRAST:  4m GADAVIST GADOBUTROL 1 MMOL/ML IV SOLN COMPARISON:  CT of the abdomen and pelvis which was performed on July 16, 2021 as well. FINDINGS: Urinary Tract: Urinary bladder with smooth contours. No signs of distal ureteral dilation. Bowel: Marked rectal thickening and perirectal stranding. In the rectoprostatic region there is evidence of open "SpaceOAR" material separating the prostate from the rectum. This shows some peripheral enhancement and measures 4.6 by 1.9 cm greatest axial dimension. There is edema throughout the retroperitoneum and edema throughout fascial planes of the pelvis. Vascular/Lymphatic: No signs of adenopathy about the pelvis. Vascular structures  not well assessed due to protocol. Vascular structures are of normal caliber. Reproductive: Prostate which is heterogeneous, nonspecific in this patient with known prostate cancer post radiation. Enhancement of much of the gland is demonstrated with lobular appearance presumably diffuse prostate cancer. Other: Stranding throughout the retroperitoneum and throughout fascial planes of the pelvis in the setting of proctitis. Musculoskeletal: No bony lesions of the pelvis. IMPRESSION: 1. Evidence of SpaceOAR material separating the prostate from the rectum. No definite signs of metastatic disease to the pelvis at this time 2. Marked rectal thickening and perirectal stranding, findings compatible with proctitis likely radiation related. Correlate with any signs of infection. 3. Stranding throughout the retroperitoneum and throughout fascial planes of the pelvis in the setting of proctitis. Peripheral enhancement of material in the rectoprostatic region raises the possibility of superinfection of this area though findings remain nonspecific in the setting of radiotherapy. Consider close follow-up given degree of inflammation that tracks throughout the pelvis which is unchanged compared to the CT that was acquired of the same date. 4. No signs of adenopathy about the pelvis with changes of diffuse prostate cancer within the prostate gland. Electronically Signed   By: GZetta BillsM.D.   On: 07/16/2021 17:41   ECHOCARDIOGRAM COMPLETE  Result Date: 07/16/2021    ECHOCARDIOGRAM REPORT   Patient Name:   [name] MOULDDate of Exam: 07/16/2021 Medical Rec #:  [id number]      Height:       71.0 in Accession #:    [id number]     Weight:       181.0 lb Date of Birth:  [birth year]      BSA:          2.021 m Patient Age:    89years        BP:           159/101 mmHg Patient Gender: M               HR:           72 bpm. Exam Location:  Inpatient Procedure: 2D Echo, Cardiac Doppler and Color Doppler Indications:    Syncope   History:        Patient has prior history of Echocardiogram examinations, most                 recent 11/07/2015. Risk Factors:Hypertension, HLD and Former                 Smoker.  Sonographer:    DJoette CatchingRCS Referring Phys:  [id number] DAVID MANUEL ORTIZ  Sonographer Comments: Technically challenging study due to limited acoustic windows, no parasternal window and no apical window. Image acquisition challenging due to patient body habitus. Study done from subcostal window IMPRESSIONS  1. Left ventricular ejection fraction, by estimation, is 55 to 60%. The left ventricle has normal function. The left ventricle has no regional wall motion abnormalities. Left ventricular diastolic parameters are indeterminate.  2. Right ventricular systolic function is normal. The right ventricular size is normal.  3. The mitral valve is normal in structure. Trivial mitral valve regurgitation. No evidence of mitral stenosis.  4. The aortic valve is tricuspid. There is mild calcification of the aortic valve. Aortic valve regurgitation is not visualized. No aortic stenosis is present.  5. The inferior vena cava is normal in size with greater than 50% respiratory variability, suggesting right atrial pressure of 3 mmHg.  6. Only subcostal windows available for interpretation. FINDINGS  Left Ventricle: Left ventricular ejection fraction, by estimation, is 55 to 60%. The left ventricle has normal function. The left ventricle has no regional wall motion abnormalities. The left ventricular internal cavity size was normal in size. There is  no left ventricular hypertrophy. Left ventricular diastolic parameters are indeterminate. Right Ventricle: The right ventricular size is normal. No increase in right ventricular wall thickness. Right ventricular systolic function is normal. Left Atrium: Left atrial size was normal in size. Right Atrium: Right atrial size was normal in size. Pericardium: There is no evidence of pericardial effusion.  Mitral Valve: The mitral valve is normal in structure. Trivial mitral valve regurgitation. No evidence of mitral valve stenosis. Tricuspid Valve: The tricuspid valve is normal in structure. Tricuspid valve regurgitation is trivial. No evidence of tricuspid stenosis. Aortic Valve: The aortic valve is tricuspid. There is mild calcification of the aortic valve. Aortic valve regurgitation is not visualized. No aortic stenosis is present. Aortic valve mean gradient measures 4.0 mmHg. Aortic valve peak gradient measures 6.9 mmHg. Aortic valve area, by VTI measures 2.05 cm. Pulmonic Valve: The pulmonic valve was normal in structure. Pulmonic valve regurgitation is not visualized. No evidence of pulmonic stenosis. Aorta: The aortic root is normal in size and structure. Venous: The inferior vena cava is normal in size with greater than 50% respiratory variability, suggesting right atrial pressure of 3 mmHg. IAS/Shunts: No atrial level shunt detected by color flow Doppler.  LEFT VENTRICLE PLAX 2D LVIDd:         4.20 cm LVIDs:         2.90 cm LV PW:         0.70 cm LV IVS:        0.90 cm LVOT diam:     2.10 cm LV SV:         52 LV SV Index:   26 LVOT Area:     3.46 cm  IVC IVC diam: 1.70 cm LEFT ATRIUM         Index LA diam:    2.80 cm 1.39 cm/m  AORTIC VALVE                    PULMONIC VALVE AV Area (Vmax):    2.38 cm     PV Vmax:       0.84 m/s AV Area (Vmean):   2.06 cm     PV Peak grad:  2.8 mmHg AV Area (VTI):     2.05 cm AV Vmax:           131.00  cm/s AV Vmean:          96.900 cm/s AV VTI:            0.255 m AV Peak Grad:      6.9 mmHg AV Mean Grad:      4.0 mmHg LVOT Vmax:         90.10 cm/s LVOT Vmean:        57.600 cm/s LVOT VTI:          0.151 m LVOT/AV VTI ratio: 0.59  AORTA Ao Root diam: 3.70 cm MITRAL VALVE               TRICUSPID VALVE MV Area (PHT): 4.49 cm    TR Peak grad:   27.7 mmHg MV Decel Time: 169 msec    TR Vmax:        263.00 cm/s MV E velocity: 53.10 cm/s MV A velocity: 60.00 cm/s  SHUNTS MV E/A  ratio:  0.89        Systemic VTI:  0.15 m                            Systemic Diam: 2.10 cm Glori Bickers MD Electronically signed by Glori Bickers MD Signature Date/Time: 07/16/2021/4:27:20 PM    Final    CT Angio Chest PE W and/or Wo Contrast  Result Date: 07/16/2021 CLINICAL DATA:  Pulmonary embolism suspected. High probability. Nonlocalized abdominal pain with occasional right upper lobe distribution. Fever and syncope. Possible sepsis. EXAM: CT ANGIOGRAPHY CHEST CT ABDOMEN AND PELVIS WITH CONTRAST TECHNIQUE: Multidetector CT imaging of the chest was performed using the standard protocol during bolus administration of intravenous contrast. Multiplanar CT image reconstructions and MIPs were obtained to evaluate the vascular anatomy. Multidetector CT imaging of the abdomen and pelvis was performed using the standard protocol during bolus administration of intravenous contrast. RADIATION DOSE REDUCTION: This exam was performed according to the departmental dose-optimization program which includes automated exposure control, adjustment of the mA and/or kV according to patient size and/or use of iterative reconstruction technique. CONTRAST:  149m OMNIPAQUE IOHEXOL 350 MG/ML SOLN COMPARISON:  CTA chest 10/23/2013, portable chest today portable chest 10/25/2020, CT abdomen and pelvis with contrast 10/25/2020, and CT abdomen and pelvis with contrast 04/08/2012. FINDINGS: CTA CHEST FINDINGS Cardiovascular: There stable proximal thoracic aortic ectasia up to 3.8 cm, scattered aortic calcific plaques. There is no dissection or aneurysm no penetrating ulcer. The great vessels opacify well with normal variant brachiobocarotid trunk. The cardiac size is normal. There is no pericardial effusion or visible coronary artery calcification. The pulmonary arteries are normal in caliber and clear at least to the segmental level but the subsegmental arterial bed is obscured by breathing motion. Pulmonary veins are  decompressed. Mediastinum/Nodes: Mild chronic elevation right hemidiaphragm. There is no thyroid or axillary mass. There is again noted prominent AP window lymph node measuring 2.7 by 1.1 cm not significantly changed. There is no further intrathoracic adenopathy. The trachea and esophagus are unremarkable. Lungs/Pleura: There is minimal paraseptal emphysematous change in the right apex. No pulmonary consolidation mass or effusion seen. Evaluation of the lungs technically limited due to respiratory motion. Musculoskeletal: There are degenerative changes of the thoracic spine multilevel degenerative discs and Schmorl's nodes and mild chronic anterior wedging of the T7 vertebral body. There are facet spurs and multilevel thoracic foraminal stenosis. There is no concerning regional bone lesion. No chest wall lesion is seen. Review of the MIP images confirms the  above findings. CT ABDOMEN and PELVIS FINDINGS Comment: Abundant breathing motion limits this study as well. Hepatobiliary: No abnormality allowing for breathing motion. The gallbladder and bile ducts are unremarkable as visualized. Pancreas: No focal abnormality is seen through the breathing motion. Spleen: Unremarkable. Adrenals/Urinary Tract: There are bilateral renal cysts, largest is septated versus 2 abutting cysts in aggregate measuring 6.5 cm left lower pole. Stable right renal cyst measuring 3.6 cm. There is no solid renal mass enhancement. There is no adrenal mass. There is no urinary stone or obstruction. Diffusely thickened bladder seen with perivesical stranding. Stomach/Bowel: The stomach and unopacified small bowel are unremarkable. There is fluid in the colon. The appendix is normal. There are left-sided uncomplicated diverticula. Rectum is diffusely circumferentially thickened with surrounding stranding. Vascular/Lymphatic: Aortic atherosclerosis. No definitive adenopathy. Reproductive: Mild prostatomegaly. Fiducial markers have been placed in  the prostate since the prior study. There is stranding around the prostate continuing from the area around the bladder. Both of the testicles are retracted up into the inguinal canals. Other: On series 3 axial 81-88 there is an ill-defined area of enhancement in between the rectum and the seminal vesicles and prostate measuring 4.1 x 1.6 by 4.4 cm extends down to the pelvic floor but not below the urogenital diaphragm. Some images suggest this could be serpiginous vascular structure such as localized pelvic venous congestion but could also be a small contrast extravasation into pelvic floor from pelvic floor blood vessels or potentially from the posterior prostate. Another possibility would be an enhancing metastatic mass but a pelvic metastasis should not be this densely enhancing. In addition to diffuse stranding in the pelvis from the level of the bladder dome to floor there is mild ascites along the pelvic sidewalls tracking to the distal paracolic gutters and into the deep pelvic recesses. There is no free air and no abscess is seen. There are small inguinal fat hernias. Musculoskeletal: There are degenerative changes of the lumbar spine there is partial ankylosis across the L4-5 interspace ankylosis over the facet joints at this level. No worrisome regional bone lesion is seen. Ankylosis both SI joints. Review of the MIP images confirms the above findings. IMPRESSION: 1. The patient has most likely had radiation therapy to the prostate and surrounding tissues. There is diffuse stranding in the pelvis from the bladder dome inferiorly, bladder thickening which is probably radiation related versus cystitis, diffusely thickened rectum probably also XRT related or could be infectious proctitis. 2. There is ill-defined dense enhancement in between the rectum, seminal vesicles and prostate over an area of 4.1 x 1.6 x 4.4 cm, uncertain etiology not present on the prior scans. Some images suggest this could be some  sort of pelvic venous congestive phenomenon. Also possible this could be pelvic contrast extravasation from the pelvic floor or prostate blood vessels, but it does not have a fluid appearance per se, and his prostate biopsy was several months ago therefore this would be unlikely. Other possibility would be an ill-defined metastatic mass. MRI without and with contrast may be helpful. 3. Mild pelvic ascites. 4. No pulmonary arterial embolus at least through the segmental arteries. The subsegmental arterial bed largely obscured due to breathing motion. 5. Aortic atherosclerosis with stable proximal aortic ectasia. 6. Both testicles retracted into the inguinal canals. 7. Remaining findings described above. Electronically Signed   By: Telford Nab M.D.   On: 07/16/2021 06:37   CT ABDOMEN PELVIS W CONTRAST  Result Date: 07/16/2021 CLINICAL DATA:  Pulmonary embolism suspected. High  probability. Nonlocalized abdominal pain with occasional right upper lobe distribution. Fever and syncope. Possible sepsis. EXAM: CT ANGIOGRAPHY CHEST CT ABDOMEN AND PELVIS WITH CONTRAST TECHNIQUE: Multidetector CT imaging of the chest was performed using the standard protocol during bolus administration of intravenous contrast. Multiplanar CT image reconstructions and MIPs were obtained to evaluate the vascular anatomy. Multidetector CT imaging of the abdomen and pelvis was performed using the standard protocol during bolus administration of intravenous contrast. RADIATION DOSE REDUCTION: This exam was performed according to the departmental dose-optimization program which includes automated exposure control, adjustment of the mA and/or kV according to patient size and/or use of iterative reconstruction technique. CONTRAST:  176m OMNIPAQUE IOHEXOL 350 MG/ML SOLN COMPARISON:  CTA chest 10/23/2013, portable chest today portable chest 10/25/2020, CT abdomen and pelvis with contrast 10/25/2020, and CT abdomen and pelvis with contrast  04/08/2012. FINDINGS: CTA CHEST FINDINGS Cardiovascular: There stable proximal thoracic aortic ectasia up to 3.8 cm, scattered aortic calcific plaques. There is no dissection or aneurysm no penetrating ulcer. The great vessels opacify well with normal variant brachiobocarotid trunk. The cardiac size is normal. There is no pericardial effusion or visible coronary artery calcification. The pulmonary arteries are normal in caliber and clear at least to the segmental level but the subsegmental arterial bed is obscured by breathing motion. Pulmonary veins are decompressed. Mediastinum/Nodes: Mild chronic elevation right hemidiaphragm. There is no thyroid or axillary mass. There is again noted prominent AP window lymph node measuring 2.7 by 1.1 cm not significantly changed. There is no further intrathoracic adenopathy. The trachea and esophagus are unremarkable. Lungs/Pleura: There is minimal paraseptal emphysematous change in the right apex. No pulmonary consolidation mass or effusion seen. Evaluation of the lungs technically limited due to respiratory motion. Musculoskeletal: There are degenerative changes of the thoracic spine multilevel degenerative discs and Schmorl's nodes and mild chronic anterior wedging of the T7 vertebral body. There are facet spurs and multilevel thoracic foraminal stenosis. There is no concerning regional bone lesion. No chest wall lesion is seen. Review of the MIP images confirms the above findings. CT ABDOMEN and PELVIS FINDINGS Comment: Abundant breathing motion limits this study as well. Hepatobiliary: No abnormality allowing for breathing motion. The gallbladder and bile ducts are unremarkable as visualized. Pancreas: No focal abnormality is seen through the breathing motion. Spleen: Unremarkable. Adrenals/Urinary Tract: There are bilateral renal cysts, largest is septated versus 2 abutting cysts in aggregate measuring 6.5 cm left lower pole. Stable right renal cyst measuring 3.6 cm.  There is no solid renal mass enhancement. There is no adrenal mass. There is no urinary stone or obstruction. Diffusely thickened bladder seen with perivesical stranding. Stomach/Bowel: The stomach and unopacified small bowel are unremarkable. There is fluid in the colon. The appendix is normal. There are left-sided uncomplicated diverticula. Rectum is diffusely circumferentially thickened with surrounding stranding. Vascular/Lymphatic: Aortic atherosclerosis. No definitive adenopathy. Reproductive: Mild prostatomegaly. Fiducial markers have been placed in the prostate since the prior study. There is stranding around the prostate continuing from the area around the bladder. Both of the testicles are retracted up into the inguinal canals. Other: On series 3 axial 81-88 there is an ill-defined area of enhancement in between the rectum and the seminal vesicles and prostate measuring 4.1 x 1.6 by 4.4 cm extends down to the pelvic floor but not below the urogenital diaphragm. Some images suggest this could be serpiginous vascular structure such as localized pelvic venous congestion but could also be a small contrast extravasation into pelvic floor from pelvic floor  blood vessels or potentially from the posterior prostate. Another possibility would be an enhancing metastatic mass but a pelvic metastasis should not be this densely enhancing. In addition to diffuse stranding in the pelvis from the level of the bladder dome to floor there is mild ascites along the pelvic sidewalls tracking to the distal paracolic gutters and into the deep pelvic recesses. There is no free air and no abscess is seen. There are small inguinal fat hernias. Musculoskeletal: There are degenerative changes of the lumbar spine there is partial ankylosis across the L4-5 interspace ankylosis over the facet joints at this level. No worrisome regional bone lesion is seen. Ankylosis both SI joints. Review of the MIP images confirms the above findings.  IMPRESSION: 1. The patient has most likely had radiation therapy to the prostate and surrounding tissues. There is diffuse stranding in the pelvis from the bladder dome inferiorly, bladder thickening which is probably radiation related versus cystitis, diffusely thickened rectum probably also XRT related or could be infectious proctitis. 2. There is ill-defined dense enhancement in between the rectum, seminal vesicles and prostate over an area of 4.1 x 1.6 x 4.4 cm, uncertain etiology not present on the prior scans. Some images suggest this could be some sort of pelvic venous congestive phenomenon. Also possible this could be pelvic contrast extravasation from the pelvic floor or prostate blood vessels, but it does not have a fluid appearance per se, and his prostate biopsy was several months ago therefore this would be unlikely. Other possibility would be an ill-defined metastatic mass. MRI without and with contrast may be helpful. 3. Mild pelvic ascites. 4. No pulmonary arterial embolus at least through the segmental arteries. The subsegmental arterial bed largely obscured due to breathing motion. 5. Aortic atherosclerosis with stable proximal aortic ectasia. 6. Both testicles retracted into the inguinal canals. 7. Remaining findings described above. Electronically Signed   By: Telford Nab M.D.   On: 07/16/2021 06:37   CT Head Wo Contrast  Result Date: 07/16/2021 CLINICAL DATA:  Moderate to severe head trauma. EXAM: CT HEAD WITHOUT CONTRAST TECHNIQUE: Contiguous axial images were obtained from the base of the skull through the vertex without intravenous contrast. RADIATION DOSE REDUCTION: This exam was performed according to the departmental dose-optimization program which includes automated exposure control, adjustment of the mA and/or kV according to patient size and/or use of iterative reconstruction technique. COMPARISON:  Head CT 02/10/2010 FINDINGS: Brain: There is interval progression of mild  cerebral atrophy, small-vessel disease and atrophic ventriculomegaly compared to the prior study. There is slight cerebellar atrophy also increased. No asymmetry is seen concerning for an acute infarct, hemorrhage or mass. There is no midline shift. Basal cisterns are clear. Vascular: No hyperdense vessel or unexpected calcification. Skull: There is no visible scalp hematoma, no depressed skull fracture or focal skull lesion is seen. Sinuses/Orbits: There is mild membrane thickening in the right maxillary and bilateral ethmoid sinus air cells without fluid level or other significant visualized sinus disease. Other: There is no mastoid effusion. Mastoid pneumatization continues into both petrous apices and the posterior squamous temporal bones. IMPRESSION: No acute intracranial CT findings or depressed skull fractures. Progressive mild atrophy and small-vessel disease since 02/10/2010. Electronically Signed   By: Telford Nab M.D.   On: 07/16/2021 05:42   DG Chest Port 1 View  Result Date: 07/16/2021 CLINICAL DATA:  Questionable sepsis.  History of prostate cancer. EXAM: PORTABLE CHEST 1 VIEW COMPARISON:  Portable chest 10/25/2020. FINDINGS: The cardiac size is normal.  No vascular congestion is seen. There is mild ectasia and tortuosity of the aorta with a stable mediastinum. The lungs are clear. No pleural effusion is seen or evidence of pneumothorax. Thoracic spondylosis. IMPRESSION: No evidence of acute chest disease or interval changes. Electronically Signed   By: Telford Nab M.D.   On: 07/16/2021 05:35    ____________________________________________   PROCEDURES  Procedure(s) performed:   .Critical Care  Performed by: Margette Fast, MD Authorized by: Margette Fast, MD   Critical care provider statement:    Critical care time (minutes):  45   Critical care time was exclusive of:  Separately billable procedures and treating other patients and teaching time   Critical care was necessary  to treat or prevent imminent or life-threatening deterioration of the following conditions:  Circulatory failure, CNS failure or compromise and sepsis   Critical care was time spent personally by me on the following activities:  Development of treatment plan with patient or surrogate, discussions with consultants, evaluation of patient's response to treatment, examination of patient, ordering and review of laboratory studies, ordering and review of radiographic studies, ordering and performing treatments and interventions, pulse oximetry, re-evaluation of patient's condition, review of old charts, blood draw for specimens and obtaining history from patient or surrogate   I assumed direction of critical care for this patient from another provider in my specialty: no     Care discussed with: admitting provider      ____________________________________________   INITIAL IMPRESSION / [place] / ED COURSE  Pertinent labs & imaging results that were available during my care of the patient were reviewed by me and considered in my medical decision making (see chart for details).   This patient is Presenting for Evaluation of syncope, which does require a range of treatment options, and is a complaint that involves a high risk of morbidity and mortality.  The Differential Diagnoses includes but is not exclusive to cardiogenic syncope, PE, acute cholecystitis, intrathoracic causes for epigastric abdominal pain, gastritis, duodenitis, pancreatitis, small bowel or large bowel obstruction, abdominal aortic aneurysm, hernia, gastritis, etc.   Critical Interventions-    Medications  acetaminophen (TYLENOL) tablet 650 mg (has no administration in time range)    Or  acetaminophen (TYLENOL) suppository 650 mg (has no administration in time range)  ondansetron (ZOFRAN) tablet 4 mg (has no administration in time range)    Or  ondansetron (ZOFRAN) injection 4 mg (has no administration in time range)   sodium chloride flush (NS) 0.9 % injection 3 mL (3 mLs Intravenous Not Given 07/16/21 2145)  LORazepam (ATIVAN) injection 0.5 mg (has no administration in time range)  latanoprost (XALATAN) 0.005 % ophthalmic solution 1 drop (1 drop Both Eyes Given 07/16/21 2252)  pantoprazole (PROTONIX) injection 40 mg (40 mg Intravenous Given 07/16/21 2244)  ceFEPIme (MAXIPIME) 2 g in sodium chloride 0.9 % 100 mL IVPB (2 g Intravenous New Bag/Given 07/16/21 2000)  metroNIDAZOLE (FLAGYL) IVPB 500 mg (500 mg Intravenous New Bag/Given 07/16/21 2108)  vancomycin (VANCOREADY) IVPB 1500 mg/300 mL (1,500 mg Intravenous New Bag/Given 07/16/21 2249)  acetaminophen (TYLENOL) tablet 1,000 mg (1,000 mg Oral Given 07/16/21 0411)  sodium chloride 0.9 % bolus 1,000 mL (0 mLs Intravenous Stopped 07/16/21 0518)  iohexol (OMNIPAQUE) 350 MG/ML injection 100 mL (100 mLs Intravenous Contrast Given 07/16/21 0500)  ceFEPIme (MAXIPIME) 2 g in sodium chloride 0.9 % 100 mL IVPB (0 g Intravenous Stopped 07/16/21 0550)  metroNIDAZOLE (FLAGYL) IVPB 500 mg (0 mg Intravenous Stopped  07/16/21 0618)  vancomycin (VANCOCIN) IVPB 1000 mg/200 mL premix (0 mg Intravenous Stopped 07/16/21 0702)  sodium chloride 0.9 % bolus 1,000 mL (0 mLs Intravenous Stopped 07/16/21 0724)  sodium chloride 0.9 % bolus 500 mL (0 mLs Intravenous Stopped 07/16/21 0650)  0.9 %  sodium chloride infusion (Manually program via Guardrails IV Fluids) ( Intravenous New Bag/Given 07/16/21 1811)  potassium chloride 10 mEq in 100 mL IVPB (0 mEq Intravenous Stopped 07/16/21 1855)  gadobutrol (GADAVIST) 1 MMOL/ML injection 8 mL (8 mLs Intravenous Contrast Given 07/16/21 1710)    Reassessment after intervention: Patient's low BP responding to IVF.    I did obtain Additional Historical Information from EMS.  I decided to review pertinent External Data, and in summary patient followed by [facility] here at [facility].   Clinical Laboratory Tests Ordered, included no leukocytosis  but mild lactic acidosis with lactate of 2.3.  Fecal occult positive and hemoglobin downtrending to 8.8 which in May was normal. COVID/Flu negative.   Radiologic Tests Ordered, included CT head, CTA chest, CT abdomen/pelvis. I independently interpreted the images and agree with radiology interpretation.   Cardiac Monitor Tracing which shows NSR.   Social Determinants of Health Risk patient is a non-smoker.   Consult complete with Hospitalist - Plan for admit.   Medical Decision Making: Summary:  Patient arrives to the emergency department after syncope.  Found to have fever.  Complaining of some discomfort in the right lower chest/upper abdomen.  No peritonitis on exam.  Clear lungs.  No hypoxemia.  Patient arrives with fever-year-old male blood with no clear etiology.  Complaining of intermittent headaches but nothing currently.  Differential is broad at this time.  Initiating sepsis work-up along with syncope evaluation.  He would be at increased risk for PE.  Will obtain abdominal imaging given his description of discomfort on that side as well.   Reevaluation with update and discussion with patient. Mental status unchanged. Sent type and screen and anemia labs with low Hb. Follow in case need for PRBC transfusion and continue abx.   Disposition: admit  ____________________________________________  FINAL CLINICAL IMPRESSION(S) / ED DIAGNOSES  Final diagnoses:  Syncope and collapse  Anemia, unspecified type    Note:  This document was prepared using Dragon voice recognition software and may include unintentional dictation errors.  Nanda Quinton, MD, [facility] Emergency Medicine    Mimie Goering, [facility], MD 07/16/21 [phone]

## 2021-07-16 NOTE — H&P (Signed)
History and Physical    Patient: [name] VCB:[id number] DOB: [birth year] DOA: 07/16/2021 DOS: the patient was seen and examined on 07/16/2021 PCP: Angelica Pou, MD  Patient coming from: Home  Chief Complaint:  Chief Complaint  Patient presents with   Loss of Consciousness   HPI: CARREL LEATHER is a 81 y.o. male with medical history significant of BPH, prostate cancer, mild intermittent asthma, osteoarthritis, depression, HSV 1 and HSV 2 infections, history of colitis, diverticulosis, diverticulitis, LLE DVT, history of pulmonary embolism, history of neurocardiogenic syncopal episode, hypertension, hyperlipidemia who underwent radiation therapy on Friday for prostate cancer and came to the emergency department due to have a syncopal episode yesterday evening and another one this morning after going to the bathroom with loss of continence and bladder.  There was no tonic-clonic movements or postictal.  He has also been having epigastric and lower abdominal pain.  He has been having fevers/chills for the past 3 days associated with rhinorrhea and sneezing.  He denied cough, wheezing, sore throat,  or hemoptysis.  No chest pain, palpitations, diaphoresis, nausea or emesis or any prodromal symptoms.No PND, orthopnea or pitting edema of the lower extremities.  No nausea, emesis, diarrhea, constipation, melena or hematochezia.  Positive frequency, but no flank pain, dysuria,or hematuria.  No polyuria, polydipsia, polyphagia or blurred vision.   ED course: Initial vital signs were temperature 101 F, pulse 88, respiration 12, BP 115/53 mmHg O2 sat 100% on room air.  He received IV fluids 2500 mL of NS bolus, cefepime, vancomycin and metronidazole.  Lab work: CBC is her white count of 4.5, hemoglobin 8.8 g/dL platelets are 114.  PT 14.2, INR 1.1 PTT 28.  Lactic acid 2.3 and then 2.8 mmol/L.  Troponin x2 normal.  Normal magnesium and phosphorus.  CMP showed a potassium of 3.4 mmol/L, glucose of  111 mg/dL, AST of 181 and ALT 95 units/L.  The rest of the CMP was normal.  Imaging: Portable 1 view chest radiograph with no evidence of acute chest disease or interval changes.  CT head with no acute intracranial CT findings.  CTA chest PE with normal cardiac size, scattered aortic calcific plaques and no PE.  Findings of recent radiation therapy to prostate and surrounding tissues.  Diffuse stranding of the pelvis from bladder dome inferiorly, bladder thickening secondary to radiation versus cystitis.  There is an ill-defined dense enhancement in between the rectum, seminal vesicles and prostate over an area of 4.1 x 1.6 x 4.4 cm of unknown etiology.  MRI with and without contrast suggested.  There is mild pelvic ascites.  Both testicles are retracted into the inguinal canals.  Please see images and full radiology report for further details.  Review of Systems: As mentioned in the history of present illness. All other systems reviewed and are negative. 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    History of colitis 2005   per colonoscopy chronic inflammation colits secondary to ascending colon ulceration   History of diverticulitis of colon    History of DVT of lower extremity 09/13/2006   LLE   History of pulmonary embolism 09/13/2006   bilateral PE secondary to DVT of LLE in 2008 On Warfarin until 01/2010  when he was admitted for syncope and noticed that he had completed course of warfarin and it was stopped.   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  Always with standing or micturation.  Had bradycardic/hypotensive response with standing when hospitalized in 1/12.  On Florinef.  Echo (1/12): EF 60-65%, mild aortic root dilation.    HTN (hypertension)     followed by pcp   (05-12-2023no medication since 01/ 2023 by pt's pcp)   Hyperlipidemia    Malignant neoplasm prostate ([employer]) 01/2021   urologist-- dr bell;  dx 01/ 2023, Gleason 4+4   Mild intermittent asthma    PFT's 05/2012 : FEV1 / FVC 75 and 78 pre and post BD. FEV1 74 and 75 pre and post BD. TLC 72. RV 93. DLCO 79. Read as restrictive lung disease, possible with obstructive lung disease. Prior smoker.     OA (osteoarthritis)    knees   Wears glasses    Past Surgical History:  Procedure Laterality Date   GOLD SEED IMPLANT N/A 06/13/2021   Procedure: GOLD SEED IMPLANT;  Surgeon: Lucas Mallow, MD;  Location: [facility];  Service: Urology;  Laterality: N/A;   I & D EXTREMITY Right 03/04/2014   Procedure: IRRIGATION AND DEBRIDEMENT FINGER / HAND;  Surgeon: Dayna Barker, MD;  Location: [facility];  Service: Plastics;  Laterality: Right;  I&D Right Small Finger and Right Wrist   KNEE ARTHROSCOPY Left 2000   SPACE OAR INSTILLATION N/A 06/13/2021   Procedure: SPACE OAR INSTILLATION;  Surgeon: Lucas Mallow, MD;  Location: [facility];  Service: Urology;  Laterality: N/A;   TRANSURETHRAL RESECTION OF PROSTATE  04/01/2003   '@[facility]'$   by dr [name]   Social History:  reports that he has never smoked. He has never used smokeless tobacco. He reports that he does not drink alcohol and does not use drugs.  No Known Allergies  Family History  Problem Relation Age of Onset   Heart disease Father    Stroke Father    Heart disease Brother 99    Prior to Admission medications   Medication Sig Start Date End Date Taking? Authorizing Provider  albuterol (PROVENTIL HFA) 108 (90 Base) MCG/ACT inhaler Inhale 1-2 puffs into the lungs every 6 (six) hours as needed for wheezing or shortness of breath. Patient not taking: Reported on 06/08/2021 06/01/20   Angelica Pou, MD  aspirin EC 81 MG tablet Take 81 mg by mouth daily.  Patient not taking: Reported on 03/13/2021     [provider]  cephALEXin (KEFLEX) 500 MG capsule Take 1 capsule (500 mg total) by mouth 4 (four) times daily. Patient not taking: Reported on 03/13/2021 06/03/20   Mesner, Corene Cornea, MD  colchicine 0.6 MG tablet Take 1 tablet (0.6 mg total) by mouth daily. Patient not taking: Reported on 03/13/2021 06/01/20 06/01/21  Angelica Pou, MD  latanoprost (XALATAN) 0.005 % ophthalmic solution 1 drop at bedtime. Patient not taking: Reported on 03/13/2021 12/14/18   [provider]  lisinopril-hydrochlorothiazide (ZESTORETIC) 20-12.5 MG tablet Take 1 tablet by mouth daily. Patient not taking: Reported on 03/13/2021 06/01/20   Angelica Pou, MD  ondansetron [facility] ODT) 4 MG disintegrating tablet '4mg'$  ODT q4 hours prn nausea/vomit Patient not taking: Reported on 03/13/2021 10/25/20   Drenda Freeze, MD    Physical Exam: Vitals:   07/16/21 0930 07/16/21 1030 07/16/21 1045 07/16/21 1200  BP: 123/63 111/64 113/78 118/70  Pulse: 66 65 63  60  Resp: '15 14 14 15  '$ Temp:      TempSrc:      SpO2: 92% 100% 100% 97%  Weight:       Physical Exam Constitutional:      General: He is awake.     Appearance: He is ill-appearing.  HENT:     Head: Normocephalic.     Mouth/Throat:     Mouth: Mucous membranes are moist.  Eyes:     General: No scleral icterus.    Pupils: Pupils are equal, round, and reactive to light.  Neck:     Vascular: No JVD.  Cardiovascular:     Rate and Rhythm: Normal rate and regular rhythm.     Heart sounds: S1 normal and S2 normal.  Abdominal:     General: Bowel sounds are normal. There is no distension.     Palpations: Abdomen is soft.     Tenderness: There is abdominal tenderness in the right lower quadrant and left lower quadrant. There is no right CVA tenderness, left CVA tenderness, guarding or rebound.  Musculoskeletal:     Cervical back: Neck supple.     Right lower leg: No edema.     Left lower leg: No edema.  Skin:    General: Skin is warm and  dry.  Neurological:     General: No focal deficit present.     Mental Status: He is alert and oriented to person, place, and time.  Psychiatric:        Mood and Affect: Mood normal.        Behavior: Behavior normal. Behavior is cooperative.    Data Reviewed:  There are no new results to review at this time.  Assessment and Plan: Principal Problem:   Sepsis due to undetermined organism POA/not ruled out [facility]) Versus SIRS Admit to PCU/inpatient. Continue IV fluids. Continue cefepime 2 g every 8 hours.   Continue metronidazole 500 mg IVPB q 12 hr. Continue vancomycin per pharmacy. Follow-up blood culture and sensitivity Follow CBC and CMP in a.m.  Active Problems:   Syncope In the setting of:   Acute blood loss anemia With positive fecal occult blood test. Cardiac enzymes negative. Obtaining echocardiogram. Monitor hematocrit and hemoglobin. Transfused 2 units of PRBC. Gastroenterology has been consulted.    Hypokalemia Replacing.    Malignant neoplasm of prostate ([place]) Status post radiation. Abnormal CT scan of pelvis. Radiology suggested MRI pelvis with and without contrast. Follow-up with urology and radiation oncology as an outpatient.    Mild intermittent asthma, uncomplicated Bronchodilators as needed.    Elevated LFTs Monitor liver function test.    Aortic atherosclerosis ([place]) Defer statins due to abnormal LFTs.    Glaucoma Continue Xalatan drops.    Advance Care Planning:   Code Status: Full Code   Consults: Gastroenterology ().  Family Communication: His grandson was at bedside.  Severity of Illness: The appropriate patient status for this patient is INPATIENT. Inpatient status is judged to be reasonable and necessary in order to provide the required intensity of service to ensure the patient's safety. The patient's presenting symptoms, physical exam findings, and initial radiographic and laboratory data in the context of their chronic  comorbidities is felt to place them at high risk for further clinical deterioration. Furthermore, it is not anticipated that the patient will be medically stable for discharge from the hospital within 2 midnights of admission.   * I certify that at the point of admission it is my clinical judgment that  the patient will require inpatient hospital care spanning beyond 2 midnights from the point of admission due to high intensity of service, high risk for further deterioration and high frequency of surveillance required.*  Author: Reubin Milan, MD 07/16/2021 12:22 PM  For on call review [email or web address].   This document was prepared using Dragon voice recognition software and may contain some unintended transcription errors.

## 2021-07-16 NOTE — ED Notes (Signed)
Pt transported to CT ?

## 2021-07-16 NOTE — ED Notes (Signed)
Attempted to straight stick for second set of cultures by RN and EMT-P, unable to obtain. Pt's IV's are not pulling back blood but flush without issue, no signs of blowing/infiltration.

## 2021-07-16 NOTE — Progress Notes (Signed)
2D Echocardiogram has been performed.  Joette Catching 07/16/2021, 3:44 PM

## 2021-07-16 NOTE — Consult Note (Signed)
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 BUN 17, Cr. 1.19 - CT ab/pelvis w contrast: ill-defined dense enhancement between rectum and seminal vesicles and prostate. Possibly metastatic mass. S/p prostate radiation   Plan: Anemia with positive FOBT. CT shows questionable metastasis between rectum and seminal vesicles. MRI has been ordered for further evaluation. Will await MRI. BUN normal, hgb stable. Will treat with Protonix '40mg'$  IV BID while awaiting MRI and then re-evaluate for possibility of EGD. Continue daily CBC and transfuse as needed to maintain HGB > 7  Continue supportive care [facility] will follow    LOS: 0 days   Crislyn Willbanks Radford Pax  PA-C 07/16/2021, 12:40 PM  Contact #  [phone]

## 2021-07-16 NOTE — ED Notes (Signed)
Patient transported to MRI at this time. 

## 2021-07-16 NOTE — ED Notes (Signed)
Alyssa RN attempted x2 straight stick for second set of cultures without success, will attempt shortly.

## 2021-07-16 NOTE — ED Notes (Signed)
Pt resting comfortable in bed at this time. All comfort and safety measures in place in room with bed in lowest position.   ?

## 2021-07-16 NOTE — Progress Notes (Signed)
Pharmacy Antibiotic Note  [name] is a 81 y.o. male admitted on 07/16/2021 with sepsis.  Medical history significant of BPH, prostate cancer, mild intermittent asthma, osteoarthritis, depression, HSV 1 and HSV 2 infections, history of colitis, diverticulosis, diverticulitis, LLE DVT, history of pulmonary embolism, history of neurocardiogenic syncopal episode, hypertension, hyperlipidemia who underwent radiation therapy on Friday for prostate cancer and came to the emergency department due to have a syncopal episode.  Cefepime and Flagyl initiated by provider.  In addition, Pharmacy has been consulted for vancomycin dosing.  Vancomycin 1000 mg x 1 initiated in ED 6/19 0555  Plan: Continue Vancomycin at 1500 mg IV Q 24 hrs (Goal AUC 400-550. Expected AUC: 526.8. SCr used: 1.19) Monitor clinical progress, renal function, vancomycin levels as indicated F/U C&S, abx deescalation / LOT    Weight: 82.1 kg (181 lb)  Temp (24hrs), Avg:100.3 F (37.9 C), Min:99.6 F (37.6 C), Max:101 F (38.3 C)  Recent Labs  Lab 07/16/21 0401 07/16/21 0409 07/16/21 0525 07/16/21 1104  WBC 4.5  --   --   --   CREATININE 1.19  --   --   --   LATICACIDVEN  --  2.3* 2.8* 1.1    Estimated Creatinine Clearance: 52.7 mL/min (by C-G formula based on SCr of 1.19 mg/dL).    No Known Allergies  Antimicrobials this admission: 6/19 cefepime >> 6/19 Flagyl >>  6/19 vancomycin >>   Microbiology results: 6/19 BCx: pending 6/19 UCx: ordered    Thank you for allowing pharmacy to be a part of this patient's care.  [name], PharmD, BCPS Clinical Pharmacist [name] Please utilize Amion for appropriate phone number to reach the unit pharmacist ([place]) 07/16/2021 1:43 PM

## 2021-07-16 NOTE — Progress Notes (Signed)
A consult was received from an ED physician for vancomycin and cefepime per pharmacy dosing.  The patient's profile has been reviewed for ht/wt/allergies/indication/available labs.   A one time order has been placed for vancomycin 1gm and cefepime 2gm.    Further antibiotics/pharmacy consults should be ordered by admitting physician if indicated.                       Thank you, Dolly Rias RPh 07/16/2021, 4:51 AM

## 2021-07-16 NOTE — ED Notes (Signed)
Pt currently in CT, will initiate antibiotics following return.

## 2021-07-16 NOTE — ED Notes (Signed)
No urinary output at this time via condom cath, will monitor.

## 2021-07-16 NOTE — ED Notes (Signed)
Pt is tolerating transfusion well with no reactions noted.

## 2021-07-16 NOTE — ED Notes (Signed)
Contacted Dr.Long in regards to pt's hypotension, fluid bolus provided as ordered.

## 2021-07-17 ENCOUNTER — Encounter (HOSPITAL_COMMUNITY): Payer: Self-pay | Admitting: Internal Medicine

## 2021-07-17 ENCOUNTER — Other Ambulatory Visit: Payer: Self-pay

## 2021-07-17 ENCOUNTER — Ambulatory Visit
Admission: RE | Admit: 2021-07-17 | Discharge: 2021-07-17 | Disposition: A | Discharge status: Home / Self Care | Payer: Medicare HMO | Source: Ambulatory Visit | Attending: Radiation Oncology | Admitting: Radiation Oncology

## 2021-07-17 DIAGNOSIS — I700 Atherosclerosis of aorta: Secondary | ICD-10-CM

## 2021-07-17 DIAGNOSIS — A419 Sepsis, unspecified organism: Secondary | ICD-10-CM | POA: Diagnosis not present

## 2021-07-17 DIAGNOSIS — D62 Acute posthemorrhagic anemia: Secondary | ICD-10-CM | POA: Diagnosis not present

## 2021-07-17 DIAGNOSIS — R7989 Other specified abnormal findings of blood chemistry: Secondary | ICD-10-CM | POA: Diagnosis not present

## 2021-07-17 DIAGNOSIS — K6289 Other specified diseases of anus and rectum: Secondary | ICD-10-CM

## 2021-07-17 DIAGNOSIS — Z191 Hormone sensitive malignancy status: Secondary | ICD-10-CM | POA: Diagnosis not present

## 2021-07-17 DIAGNOSIS — C61 Malignant neoplasm of prostate: Secondary | ICD-10-CM | POA: Diagnosis not present

## 2021-07-17 DIAGNOSIS — E876 Hypokalemia: Secondary | ICD-10-CM

## 2021-07-17 DIAGNOSIS — Z510 Encounter for antineoplastic radiation therapy: Secondary | ICD-10-CM | POA: Diagnosis not present

## 2021-07-17 DIAGNOSIS — J4520 Mild intermittent asthma, uncomplicated: Secondary | ICD-10-CM

## 2021-07-17 LAB — BPAM RBC
Blood Product Expiration Date: 202307062359
Blood Product Expiration Date: 202307072359
ISSUE DATE / TIME: 202306191801
ISSUE DATE / TIME: 202306192123
Unit Type and Rh: 7300
Unit Type and Rh: 7300

## 2021-07-17 LAB — RAD ONC ARIA SESSION SUMMARY
Course Elapsed Days: 19
Plan Fractions Treated to Date: 13
Plan Prescribed Dose Per Fraction: 1.8 Gy
Plan Total Fractions Prescribed: 25
Plan Total Prescribed Dose: 45 Gy
Reference Point Dosage Given to Date: 23.4 Gy
Reference Point Session Dosage Given: 1.8 Gy
Session Number: 13

## 2021-07-17 LAB — CBC WITH DIFFERENTIAL/PLATELET
Abs Immature Granulocytes: 0.02 10*3/uL (ref 0.00–0.07)
Basophils Absolute: 0 10*3/uL (ref 0.0–0.1)
Basophils Relative: 1 %
Eosinophils Absolute: 0.4 10*3/uL (ref 0.0–0.5)
Eosinophils Relative: 9 %
HCT: 38.7 % — ABNORMAL LOW (ref 39.0–52.0)
Hemoglobin: 12.9 g/dL — ABNORMAL LOW (ref 13.0–17.0)
Immature Granulocytes: 0 %
Lymphocytes Relative: 11 %
Lymphs Abs: 0.6 10*3/uL — ABNORMAL LOW (ref 0.7–4.0)
MCH: 28.9 pg (ref 26.0–34.0)
MCHC: 33.3 g/dL (ref 30.0–36.0)
MCV: 86.6 fL (ref 80.0–100.0)
Monocytes Absolute: 0.5 10*3/uL (ref 0.1–1.0)
Monocytes Relative: 10 %
Neutro Abs: 3.7 10*3/uL (ref 1.7–7.7)
Neutrophils Relative %: 69 %
Platelets: 163 10*3/uL (ref 150–400)
RBC: 4.47 MIL/uL (ref 4.22–5.81)
RDW: 13.8 % (ref 11.5–15.5)
WBC: 5.2 10*3/uL (ref 4.0–10.5)
nRBC: 0 % (ref 0.0–0.2)

## 2021-07-17 LAB — COMPREHENSIVE METABOLIC PANEL
ALT: 56 U/L — ABNORMAL HIGH (ref 0–44)
AST: 62 U/L — ABNORMAL HIGH (ref 15–41)
Albumin: 3.3 g/dL — ABNORMAL LOW (ref 3.5–5.0)
Alkaline Phosphatase: 45 U/L (ref 38–126)
Anion gap: 9 (ref 5–15)
BUN: 13 mg/dL (ref 8–23)
CO2: 18 mmol/L — ABNORMAL LOW (ref 22–32)
Calcium: 8.9 mg/dL (ref 8.9–10.3)
Chloride: 110 mmol/L (ref 98–111)
Creatinine, Ser: 1.1 mg/dL (ref 0.61–1.24)
GFR, Estimated: >60 mL/min (ref >60)
Glucose, Bld: 83 mg/dL (ref 70–99)
Potassium: 3.9 mmol/L (ref 3.5–5.1)
Sodium: 137 mmol/L (ref 135–145)
Total Bilirubin: 1.1 mg/dL (ref 0.3–1.2)
Total Protein: 6.7 g/dL (ref 6.5–8.1)

## 2021-07-17 LAB — TYPE AND SCREEN
ABO/RH(D): B POS
Antibody Screen: NEGATIVE
Unit division: 0
Unit division: 0

## 2021-07-17 LAB — URINE CULTURE: Culture: NO GROWTH

## 2021-07-17 LAB — GLUCOSE, CAPILLARY
Glucose-Capillary: 86 mg/dL (ref 70–99)
Glucose-Capillary: 80 mg/dL (ref 70–99)
Glucose-Capillary: 75 mg/dL (ref 70–99)
Glucose-Capillary: 82 mg/dL (ref 70–99)

## 2021-07-17 MED ORDER — ENSURE ENLIVE PO LIQD
237.0000 mL | Freq: Three times a day (TID) | ORAL | Status: DC
  Administered 2021-07-17 – 2021-07-19 (×5): 237 mL via ORAL

## 2021-07-17 MED ORDER — ADULT MULTIVITAMIN W/MINERALS CH
1.0000 | ORAL_TABLET | Freq: Every day | ORAL | Status: DC
  Administered 2021-07-17 – 2021-07-19 (×3): 1 via ORAL
  Filled 2021-07-17 (×3): qty 1

## 2021-07-17 MED ORDER — KCL IN DEXTROSE-NACL 20-5-0.45 MEQ/L-%-% IV SOLN
INTRAVENOUS | Status: DC
  Filled 2021-07-17 (×2): qty 1000

## 2021-07-17 MED ORDER — ORAL CARE MOUTH RINSE: 15.0000 mL | OROMUCOSAL | Status: DC | PRN

## 2021-07-17 NOTE — Plan of Care (Signed)
  Problem: Respiratory: Goal: Ability to maintain adequate ventilation will improve Outcome: Progressing   Problem: Education: Goal: Knowledge of General Education information will improve Description: Including pain rating scale, medication(s)/side effects and non-pharmacologic comfort measures Outcome: Progressing   Problem: Coping: Goal: Level of anxiety will decrease Outcome: Progressing   Problem: Elimination: Goal: Will not experience complications related to urinary retention Outcome: Progressing   Problem: Pain Managment: Goal: General experience of comfort will improve Outcome: Progressing   Problem: Safety: Goal: Ability to remain free from injury will improve Outcome: Progressing   Problem: Skin Integrity: Goal: Risk for impaired skin integrity will decrease Outcome: Progressing   

## 2021-07-17 NOTE — Progress Notes (Signed)
Initial Nutrition Assessment  DOCUMENTATION CODES:   Not applicable  INTERVENTION:  - will order Ensure Plus High Protein TID, each supplement provides 350 kcal and 20 grams of protein. - complete NFPE when feasible.    NUTRITION DIAGNOSIS:   Increased nutrient needs related to acute illness, cancer and cancer related treatments as evidenced by estimated needs.  GOAL:   Patient will meet greater than or equal to 90% of their needs  MONITOR:   PO intake, Supplement acceptance, Labs, Weight trends  REASON FOR ASSESSMENT:   Malnutrition Screening Tool  ASSESSMENT:   81 y.o. male with medical history of BPH, prostate cancer undergoing XRT (last: 6/16), mild intermittent asthma, osteoarthritis, depression, HSV 1 and HSV 2 infections, colitis, diverticulosis, diverticulitis, LLE DVT, HTN, and HLD. He presented to the ED due to syncopal episodes x2 and epigastric, lower abdominal pain, fevers/chills with associated rhinorrhea and sneezing x3 days.  Unable to see patient at the time of attempted visit. Diet advanced from NPO to Soft today at 1541.  He has not been seen by a [place] RD at any time in the past.  MST report note indicates that patient reported that he lost 25 lb, regained the weight, and then lost 10 lb.  Weight today is documented as 175 lb and weight on 06/13/21 as 195 lb. This indicates 20 lb weight loss (10% body weight) in the past 1 month. No information documented in the edema section of flow sheet.  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 Recommendation was made for Urology consult.    Labs reviewed; CBGs: 75-86 mg/dl, LFTs elevated.  Medications reviewed; 40 mg IV protonix BID, 10 mEq IV KCl x3 runs 6/19.  IVF; D5-1/2 NS-20 mEq KCl @ 75 ml/hr (306 kcal/24 hrs).    NUTRITION - FOCUSED PHYSICAL EXAM:  Unable at this time.  Diet Order:   Diet Order              DIET SOFT Room service appropriate? Yes; Fluid consistency: Thin  Diet effective now                   EDUCATION NEEDS:   No education needs have been identified at this time  Skin:  Skin Assessment: Reviewed RN Assessment  Last BM:  6/20 (type 7)  Height:   Ht Readings from Last 1 Encounters:  07/16/21 '5\' 11"'$  (1.803 m)    Weight:   Wt Readings from Last 1 Encounters:  07/17/21 79.5 kg     BMI:  Body mass index is 24.44 kg/m.  Estimated Nutritional Needs:  Kcal:  2200-2400 kcal Protein:  110-125 grams Fluid:  >/= 2.5 L/day     [name] Matin, MS, RD, LDN, CNSC Registered Dietitian II Inpatient Clinical Nutrition RD pager # and on-call/weekend pager # available in [facility]

## 2021-07-17 NOTE — Consult Note (Signed)
H&P Physician requesting consult: Arta Silence  Chief Complaint: Radiation proctitis  History of Present Illness: 81 year old male status post fiducial marker placement into the prostate with SpaceOAR on 06/13/2021.  Patient currently receiving radiation, last received on 6/16 presented to the emergency department due to syncopal episodes x2 and lower abdominal pain with fever/chill.  He started radiation about 2 weeks ago.  Patient denies any voiding complaints.  No hematuria or dysuria.  MRI was performed that was suggestive of extensive proctitis with inflammation in the mesorectal space, possible infection.  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    History of colitis 2005   per colonoscopy chronic inflammation colits secondary to ascending colon ulceration   History of diverticulitis of colon    History of DVT of lower extremity 09/13/2006   LLE   History of pulmonary embolism 09/13/2006   bilateral PE secondary to DVT of LLE in 2008 On Warfarin until 01/2010 when he was admitted for syncope and noticed that he had completed course of warfarin and it was stopped.   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  Always with standing or micturation.  Had bradycardic/hypotensive response with standing when hospitalized in 1/12.  On Florinef.  Echo (1/12): EF 60-65%, mild aortic root dilation.    HTN (hypertension)    followed by pcp   (05-12-2023no medication since 01/ 2023 by pt's pcp)   Hyperlipidemia    Malignant neoplasm prostate ([place]) 01/2021   urologist-- dr Tinnie Kunin;  dx 01/ 2023, Gleason 4+4   Mild intermittent asthma    PFT's 05/2012 : FEV1 / FVC 75 and 78 pre and post BD. FEV1 74 and 75 pre and post BD. TLC 72. RV 93. DLCO 79. Read as  restrictive lung disease, possible with obstructive lung disease. Prior smoker.     OA (osteoarthritis)    knees   Wears glasses    Past Surgical History:  Procedure Laterality Date   GOLD SEED IMPLANT N/A 06/13/2021   Procedure: GOLD SEED IMPLANT;  Surgeon: Lucas Mallow, MD;  [facility];  Service: Urology;  Laterality: N/A;   I & D EXTREMITY Right 03/04/2014   Procedure: IRRIGATION AND DEBRIDEMENT FINGER / HAND;  Surgeon: Dayna Barker, MD;  Location: [place];  Service: Plastics;  Laterality: Right;  I&D Right Small Finger and Right Wrist   KNEE ARTHROSCOPY Left 2000   SPACE OAR INSTILLATION N/A 06/13/2021   Procedure: SPACE OAR INSTILLATION;  Surgeon: Lucas Mallow, MD;  [facility];  Service: Urology;  Laterality: N/A;   TRANSURETHRAL RESECTION OF PROSTATE  04/01/2003   '@[facility]'$   by dr [name]    Home Medications:  Medications Prior to Admission  Medication Sig Dispense Refill Last Dose   albuterol (PROVENTIL HFA) 108 (90 Base) MCG/ACT inhaler Inhale 1-2 puffs into the lungs every 6 (six) hours as needed for wheezing or shortness of breath. (Patient not taking: Reported on 06/08/2021) 18 g 4    aspirin EC 81 MG tablet Take 81 mg by mouth daily.  (Patient not taking: Reported on 03/13/2021)      cephALEXin (KEFLEX) 500 MG capsule Take 1 capsule (500 mg total) by mouth 4 (  four) times daily. (Patient not taking: Reported on 03/13/2021) 28 capsule 0    colchicine 0.6 MG tablet Take 1 tablet (0.6 mg total) by mouth daily. (Patient not taking: Reported on 03/13/2021) 90 tablet 3    latanoprost (XALATAN) 0.005 % ophthalmic solution 1 drop at bedtime. (Patient not taking: Reported on 03/13/2021)      lisinopril-hydrochlorothiazide (ZESTORETIC) 20-12.5 MG tablet Take 1 tablet by mouth daily. (Patient not taking: Reported on 03/13/2021) 90 tablet 3    ondansetron (ZOFRAN ODT) 4 MG disintegrating tablet '4mg'$  ODT q4 hours prn nausea/vomit (Patient not  taking: Reported on 03/13/2021) 10 tablet 0    Allergies: No Known Allergies  Family History  Problem Relation Age of Onset   Heart disease Father    Stroke Father    Heart disease Brother 58   Social History:  reports that he has never smoked. He has never used smokeless tobacco. He reports that he does not drink alcohol and does not use drugs.  ROS: A complete review of systems was performed.  All systems are negative except for pertinent findings as noted. ROS   Physical Exam:  Vital signs in last 24 hours: Temp:  [98.3 F (36.8 C)-100.3 F (37.9 C)] 99.2 F (37.3 C) (06/20 1146) Pulse Rate:  [66-86] 66 (06/20 1146) Resp:  [14-21] 16 (06/20 1146) BP: (112-160)/(67-91) 129/80 (06/20 1146) SpO2:  [96 %-100 %] 100 % (06/20 1146) Weight:  [79.5 kg] 79.5 kg (06/20 0500) General:  Alert and oriented, No acute distress HEENT: Normocephalic, atraumatic Neck: No JVD or lymphadenopathy Cardiovascular: Regular rate and rhythm Lungs: Regular rate and effort Abdomen: Soft, nontender, nondistended, no abdominal masses Back: No CVA tenderness Extremities: No edema Neurologic: Grossly intact  Laboratory Data:  Results for orders placed or performed during the hospital encounter of 07/16/21 (from the past 24 hour(s))  Comprehensive metabolic panel     Status: Abnormal   Collection Time: 07/17/21  4:48 AM  Result Value Ref Range   Sodium 137 135 - 145 mmol/L   Potassium 3.9 3.5 - 5.1 mmol/L   Chloride 110 98 - 111 mmol/L   CO2 18 (L) 22 - 32 mmol/L   Glucose, Bld 83 70 - 99 mg/dL   BUN 13 8 - 23 mg/dL   Creatinine, Ser 1.10 0.61 - 1.24 mg/dL   Calcium 8.9 8.9 - 10.3 mg/dL   Total Protein 6.7 6.5 - 8.1 g/dL   Albumin 3.3 (L) 3.5 - 5.0 g/dL   AST 62 (H) 15 - 41 U/L   ALT 56 (H) 0 - 44 U/L   Alkaline Phosphatase 45 38 - 126 U/L   Total Bilirubin 1.1 0.3 - 1.2 mg/dL   GFR, Estimated >60 >60 mL/min   Anion gap 9 5 - 15  CBC with Differential     Status: Abnormal   Collection  Time: 07/17/21  4:48 AM  Result Value Ref Range   WBC 5.2 4.0 - 10.5 K/uL   RBC 4.47 4.22 - 5.81 MIL/uL   Hemoglobin 12.9 (L) 13.0 - 17.0 g/dL   HCT 38.7 (L) 39.0 - 52.0 %   MCV 86.6 80.0 - 100.0 fL   MCH 28.9 26.0 - 34.0 pg   MCHC 33.3 30.0 - 36.0 g/dL   RDW 13.8 11.5 - 15.5 %   Platelets 163 150 - 400 K/uL   nRBC 0.0 0.0 - 0.2 %   Neutrophils Relative % 69 %   Neutro Abs 3.7 1.7 - 7.7 K/uL   Lymphocytes  Relative 11 %   Lymphs Abs 0.6 (L) 0.7 - 4.0 K/uL   Monocytes Relative 10 %   Monocytes Absolute 0.5 0.1 - 1.0 K/uL   Eosinophils Relative 9 %   Eosinophils Absolute 0.4 0.0 - 0.5 K/uL   Basophils Relative 1 %   Basophils Absolute 0.0 0.0 - 0.1 K/uL   Immature Granulocytes 0 %   Abs Immature Granulocytes 0.02 0.00 - 0.07 K/uL  Glucose, capillary     Status: None   Collection Time: 07/17/21  5:04 AM  Result Value Ref Range   Glucose-Capillary 86 70 - 99 mg/dL  Glucose, capillary     Status: None   Collection Time: 07/17/21  9:04 AM  Result Value Ref Range   Glucose-Capillary 80 70 - 99 mg/dL  Glucose, capillary     Status: None   Collection Time: 07/17/21  9:45 AM  Result Value Ref Range   Glucose-Capillary 75 70 - 99 mg/dL  Glucose, capillary     Status: None   Collection Time: 07/17/21 11:40 AM  Result Value Ref Range   Glucose-Capillary 82 70 - 99 mg/dL   Recent Results (from the past 240 hour(s))  Blood Culture (routine x 2)     Status: None (Preliminary result)   Collection Time: 07/16/21  3:54 AM   Specimen: BLOOD  Result Value Ref Range Status   Specimen Description   Final    BLOOD RIGHT ANTECUBITAL Performed at [facility], [place] [address], [place], [place] [address]    Special Requests   Final    BOTTLES DRAWN AEROBIC AND ANAEROBIC Blood Culture adequate volume Performed at [facility] [address] [place], [place] [address]    Culture   Final    NO GROWTH < 24 HOURS Performed at [facility] [address], [place], [place] [address]    Report Status PENDING  Incomplete  Resp Panel by RT-PCR (Flu A&B, Covid) Anterior Nasal Swab     Status: None   Collection Time: 07/16/21  4:05 AM   Specimen: Anterior Nasal Swab  Result Value Ref Range Status   SARS Coronavirus 2 by RT PCR NEGATIVE NEGATIVE Final    Comment: (NOTE) SARS-CoV-2 target nucleic acids are NOT DETECTED.  The SARS-CoV-2 RNA is generally detectable in upper respiratory specimens during the acute phase of infection. The lowest concentration of SARS-CoV-2 viral copies this assay can detect is 138 copies/mL. A negative result does not preclude SARS-Cov-2 infection and should not be used as the sole basis for treatment or other patient management decisions. A negative result may occur with  improper specimen collection/handling, submission of specimen other than nasopharyngeal swab, presence of viral mutation(s) within the areas targeted by this assay, and inadequate number of viral copies(<138 copies/mL). A negative result must be combined with clinical observations, patient history, and epidemiological information. The expected result is Negative.  Fact Sheet for Patients:  EntrepreneurPulse.com.au  Fact Sheet for Healthcare Providers:  IncredibleEmployment.be  This test is no t yet approved or cleared by the [place] FDA and  has been authorized for detection and/or diagnosis of SARS-CoV-2 by FDA under an Emergency Use Authorization (EUA). This EUA will remain  in effect (meaning this test can be used) for the duration of the COVID-19 declaration under Section 564(b)(1) of the Act, 21 U.S.C.section 360bbb-3(b)(1), unless the authorization is terminated  or revoked sooner.       Influenza A by PCR NEGATIVE NEGATIVE Final  Influenza B by PCR NEGATIVE NEGATIVE Final    Comment: (NOTE) The Xpert Xpress SARS-CoV-2/FLU/RSV plus assay is intended as an aid in the diagnosis of  influenza from Nasopharyngeal swab specimens and should not be used as a sole basis for treatment. Nasal washings and aspirates are unacceptable for Xpert Xpress SARS-CoV-2/FLU/RSV testing.  Fact Sheet for Patients: EntrepreneurPulse.com.au  Fact Sheet for Healthcare Providers: IncredibleEmployment.be  This test is not yet approved or cleared by the [place] FDA and has been authorized for detection and/or diagnosis of SARS-CoV-2 by FDA under an Emergency Use Authorization (EUA). This EUA will remain in effect (meaning this test can be used) for the duration of the COVID-19 declaration under Section 564(b)(1) of the Act, 21 U.S.C. section 360bbb-3(b)(1), unless the authorization is terminated or revoked.  Performed at [facility], [place] [address], [place], [place] [address]   Blood Culture (routine x 2)     Status: None (Preliminary result)   Collection Time: 07/16/21  5:58 AM   Specimen: BLOOD  Result Value Ref Range Status   Specimen Description BLOOD LEFT ANTECUBITAL  Final   Special Requests   Final    BOTTLES DRAWN AEROBIC ONLY Blood Culture adequate volume   Culture   Final    NO GROWTH < 24 HOURS Performed at [facility], [place] [address] [place], [place] [address]    Report Status PENDING  Incomplete  Urine Culture     Status: None   Collection Time: 07/16/21  1:55 PM   Specimen: In/Out Cath Urine  Result Value Ref Range Status   Specimen Description   Final    IN/OUT CATH URINE Performed at [place] [address] [place], [place] [address]    Special Requests   Final    NONE Performed at [facility], [place] [address], [place], [place] [address]    Culture   Final    NO GROWTH Performed at [facility], [place] [address], [place],  [address]    Report Status 07/17/2021 FINAL  Final   Creatinine: Recent Labs    07/16/21 0401  07/17/21 0448  CREATININE 1.19 1.10    Impression/Assessment:  Radiation proctitis Prostate cancer  Plan:  Defer to gastroenterology in regards to need for flex sig.  Current impression is to hold off unless he has active bleeding.  No urological intervention necessary.  I will notify Dr. Tammi Klippel of his admission.  Certainly may affect his radiation plans.  Marton Redwood, III 07/17/2021, 5:26 PM

## 2021-07-17 NOTE — Progress Notes (Signed)
PROGRESS NOTE    [name]  OZH:[id number] DOB: 12-Aug-1940 DOA: 07/16/2021 PCP: Angelica Pou, MD   Brief Narrative:  HPI per Dr. Tennis Must on 07/16/21  [name] is a 81 y.o. male with medical history significant of BPH, prostate cancer, mild intermittent asthma, osteoarthritis, depression, HSV 1 and HSV 2 infections, history of colitis, diverticulosis, diverticulitis, LLE DVT, history of pulmonary embolism, history of neurocardiogenic syncopal episode, hypertension, hyperlipidemia who underwent radiation therapy on Friday for prostate cancer and came to the emergency department due to have a syncopal episode yesterday evening and another one this morning after going to the bathroom with loss of continence and bladder.  There was no tonic-clonic movements or postictal.  He has also been having epigastric and lower abdominal pain.  He has been having fevers/chills for the past 3 days associated with rhinorrhea and sneezing.  He denied cough, wheezing, sore throat,  or hemoptysis.  No chest pain, palpitations, diaphoresis, nausea or emesis or any prodromal symptoms.No PND, orthopnea or pitting edema of the lower extremities.  No nausea, emesis, diarrhea, constipation, melena or hematochezia.  Positive frequency, but no flank pain, dysuria,or hematuria.  No polyuria, polydipsia, polyphagia or blurred vision.   **Interim History CTA done showed no evidence of PE.  He did have diffuse stranding in the pelvis from the bladder dome anteriorly bilateral thickening secondary to radiation versus cystitis and an ill-defined dense enhancement in between the rectum, seminal vesicles and prostate over an area of 4.1 x 1.6 x 4.4 cm of unknown etiology.  Gastroenterology was consulted given that he has anemia with positive FOBT.  He was initiated on IV Protonix twice daily and an MRI of his pelvis was ordered to investigate the mesorectal abnormality seen on his CT scan.  GI recommended considering  a colonoscopy inpatient versus outpatient and urology was consulted for his radiation proctitis and his MRI was performed and was suggestive of extensive proctitis with inflammation in the mesorectal space with possible infection.  Urology deferred to GI for need for flex sig but their impression was to hold off any urological procedure given unless he has active bleeding.   Assessment and Plan: No notes have been filed under this hospital service. Service: Hospitalist    Sepsis vs SIRS due to undetermined organism POA/not ruled out ([place]) -Admit to PCU/inpatient. -Continue IV fluids. -Continue cefepime 2 g every 8 hours.   -Continue metronidazole 500 mg IVPB q 12 hr. -Continue vancomycin per pharmacy. -Follow-up blood culture and sensitivity -Follow CBC and CMP in a.m. -Continue monitor temperature curve and WBC count; WBC is 5.2   Syncope In the setting of:   Acute blood loss anemia With positive fecal occult blood test. -Cardiac enzymes negative. -Obtaining echocardiogram. -Monitor hematocrit and hemoglobin. -Transfused 2 units of PRBC; now hemoglobin/hematocrit went from 7.1/21.6 and now 12.9/30.67 -Anemia panel done and showed an iron level of 40, U IBC of 207, TIBC of 247, saturation of 16%, ferritin level of 883, folate level 13.0 and B12 level of 277 Gastroenterology has been consulted and he is having an MRI given his nasal rectal abnormality; the MRI shows extensive proctitis -Currently has no active bleeding and there is concern for mesorectal infection urology was consulted; GI recommended against a sigmoidoscopy at this point until after a conversation with urology be helpful. -We will continue monitor    Hypokalemia Replacing.     Malignant neoplasm of prostate ([place]) Status post radiation. Abnormal CT scan of pelvis. Radiology suggested  MRI pelvis with and without contrast which was done in suggestive of extensive proctitis with other findings in the mesorectal space  worrisome for infection -Urology was consulted and deferred flex sig need to GI but recommending holding off unless he has active bleeding; urology felt there is no urological intervention necessary but he will notify Dr. Tammi Klippel of the patient's admission given that this may affect his radiation plans     Mild intermittent asthma, uncomplicated -Bronchodilators as needed.     Elevated LFTs -Monitor liver function test and trending down now -AST went from 181 is now 62 -ALT went from 95 is now 56 -Continue monitor and trend LFTs and repeat CMP in AM     Aortic atherosclerosis ([place]) -Defer statins due to abnormal LFTs.     Glaucoma -Continue Xalatan drops.  DVT prophylaxis: SCDs Start: 07/16/21 0746    Code Status: Full Code Family Communication: No family currently at bedside  Disposition Plan:  Level of care: Progressive Status is: Inpatient Remains inpatient appropriate because: Needs further GI and neurological work-up   Consultants:  Urology  GI  Procedures:  ECHOCARDIOGRAM IMPRESSIONS     1. Left ventricular ejection fraction, by estimation, is 55 to 60%. The  left ventricle has normal function. The left ventricle has no regional  wall motion abnormalities. Left ventricular diastolic parameters are  indeterminate.   2. Right ventricular systolic function is normal. The right ventricular  size is normal.   3. The mitral valve is normal in structure. Trivial mitral valve  regurgitation. No evidence of mitral stenosis.   4. The aortic valve is tricuspid. There is mild calcification of the  aortic valve. Aortic valve regurgitation is not visualized. No aortic  stenosis is present.   5. The inferior vena cava is normal in size with greater than 50%  respiratory variability, suggesting right atrial pressure of 3 mmHg.   6. Only subcostal windows available for interpretation.   FINDINGS   Left Ventricle: Left ventricular ejection fraction, by estimation, is 55  to  60%. The left ventricle has normal function. The left ventricle has no  regional wall motion abnormalities. The left ventricular internal cavity  size was normal in size. There is   no left ventricular hypertrophy. Left ventricular diastolic parameters  are indeterminate.   Right Ventricle: The right ventricular size is normal. No increase in  right ventricular wall thickness. Right ventricular systolic function is  normal.   Left Atrium: Left atrial size was normal in size.   Right Atrium: Right atrial size was normal in size.   Pericardium: There is no evidence of pericardial effusion.   Mitral Valve: The mitral valve is normal in structure. Trivial mitral  valve regurgitation. No evidence of mitral valve stenosis.   Tricuspid Valve: The tricuspid valve is normal in structure. Tricuspid  valve regurgitation is trivial. No evidence of tricuspid stenosis.   Aortic Valve: The aortic valve is tricuspid. There is mild calcification  of the aortic valve. Aortic valve regurgitation is not visualized. No  aortic stenosis is present. Aortic valve mean gradient measures 4.0 mmHg.  Aortic valve peak gradient measures  6.9 mmHg. Aortic valve area, by VTI measures 2.05 cm.   Pulmonic Valve: The pulmonic valve was normal in structure. Pulmonic valve  regurgitation is not visualized. No evidence of pulmonic stenosis.   Aorta: The aortic root is normal in size and structure.   Venous: The inferior vena cava is normal in size with greater than 50%  respiratory  variability, suggesting right atrial pressure of 3 mmHg.   IAS/Shunts: No atrial level shunt detected by color flow Doppler.      LEFT VENTRICLE  PLAX 2D  LVIDd:         4.20 cm  LVIDs:         2.90 cm  LV PW:         0.70 cm  LV IVS:        0.90 cm  LVOT diam:     2.10 cm  LV SV:         52  LV SV Index:   26  LVOT Area:     3.46 cm      IVC  IVC diam: 1.70 cm   LEFT ATRIUM         Index  LA diam:    2.80 cm 1.39  cm/m   AORTIC VALVE                    PULMONIC VALVE  AV Area (Vmax):    2.38 cm     PV Vmax:       0.84 m/s  AV Area (Vmean):   2.06 cm     PV Peak grad:  2.8 mmHg  AV Area (VTI):     2.05 cm  AV Vmax:           131.00 cm/s  AV Vmean:          96.900 cm/s  AV VTI:            0.255 m  AV Peak Grad:      6.9 mmHg  AV Mean Grad:      4.0 mmHg  LVOT Vmax:         90.10 cm/s  LVOT Vmean:        57.600 cm/s  LVOT VTI:          0.151 m  LVOT/AV VTI ratio: 0.59     AORTA  Ao Root diam: 3.70 cm   MITRAL VALVE               TRICUSPID VALVE  MV Area (PHT): 4.49 cm    TR Peak grad:   27.7 mmHg  MV Decel Time: 169 msec    TR Vmax:        263.00 cm/s  MV E velocity: 53.10 cm/s  MV A velocity: 60.00 cm/s  SHUNTS  MV E/A ratio:  0.89        Systemic VTI:  0.15 m                             Systemic Diam: 2.10 cm   MRI PELVIS 1. Evidence of SpaceOAR material separating the prostate from the rectum. No definite signs of metastatic disease to the pelvis at this time 2. Marked rectal thickening and perirectal stranding, findings compatible with proctitis likely radiation related. Correlate with any signs of infection. 3. Stranding throughout the retroperitoneum and throughout fascial planes of the pelvis in the setting of proctitis. Peripheral enhancement of material in the rectoprostatic region raises the possibility of superinfection of this area though findings remain nonspecific in the setting of radiotherapy. Consider close follow-up given degree of inflammation that tracks throughout the pelvis which is unchanged compared to the CT that was acquired of the same date. 4. No signs of adenopathy about the pelvis with changes of diffuse prostate cancer within the  prostate gland.  Antimicrobials:  Anti-infectives (From admission, onward)    Start     Dose/Rate Route Frequency Ordered Stop   07/16/21 2200  vancomycin (VANCOREADY) IVPB 1500 mg/300 mL        1,500 mg 150 mL/hr over  120 Minutes Intravenous Every 24 hours 07/16/21 1333     07/16/21 1700  ceFEPIme (MAXIPIME) 2 g in sodium chloride 0.9 % 100 mL IVPB        2 g 200 mL/hr over 30 Minutes Intravenous Every 12 hours 07/16/21 1236     07/16/21 1700  metroNIDAZOLE (FLAGYL) IVPB 500 mg        500 mg 100 mL/hr over 60 Minutes Intravenous Every 12 hours 07/16/21 1236 07/23/21 1659   07/16/21 0500  ceFEPIme (MAXIPIME) 2 g in sodium chloride 0.9 % 100 mL IVPB        2 g 200 mL/hr over 30 Minutes Intravenous  Once 07/16/21 0445 07/16/21 0550   07/16/21 0500  metroNIDAZOLE (FLAGYL) IVPB 500 mg        500 mg 100 mL/hr over 60 Minutes Intravenous  Once 07/16/21 0445 07/16/21 0618   07/16/21 0500  vancomycin (VANCOCIN) IVPB 1000 mg/200 mL premix        1,000 mg 200 mL/hr over 60 Minutes Intravenous  Once 07/16/21 0445 07/16/21 4818       Subjective: Seen and examined at bedside and he thinks he is doing a little bit better today.  Denied chest pain.  States his abdominal discomfort is improved.  No nausea no vomiting.  No other concerns or close this time.  Objective: Vitals:   07/17/21 0508 07/17/21 0515 07/17/21 0837 07/17/21 1146  BP: 136/78  137/73 129/80  Pulse: 75  73 66  Resp: '17 17 20 16  '$ Temp: 99.1 F (37.3 C)  98.3 F (36.8 C) 99.2 F (37.3 C)  TempSrc: Oral  Oral Oral  SpO2: 100%  100% 100%  Weight:      Height:        Intake/Output Summary (Last 24 hours) at 07/17/2021 1946 Last data filed at 07/17/2021 1700 Gross per 24 hour  Intake 2184.7 ml  Output 3100 ml  Net -915.3 ml   Filed Weights   07/16/21 0347 07/17/21 0500  Weight: 82.1 kg 79.5 kg   Examination: Physical Exam:  Constitutional: Thin elderly African-American male currently in no acute distress appears calm Respiratory: Diminished to auscultation bilaterally with coarse breath sounds, no wheezing, rales, rhonchi or crackles. Normal respiratory effort and patient is not tachypenic. No accessory muscle use.  Unlabored  breathing Cardiovascular: RRR, no murmurs / rubs / gallops. S1 and S2 auscultated. No extremity edema. 2+ pedal pulses. No carotid bruits.  Abdomen: Soft, non-tender, non-distended. No masses palpated. No appreciable hepatosplenomegaly. Bowel sounds positive.  GU: Deferred. Musculoskeletal: No clubbing / cyanosis of digits/nails. No joint deformity upper and lower extremities. Neurologic: CN 2-12 grossly intact with no focal deficits. Psychiatric: Normal judgment and insight. Alert and oriented x 3. Normal mood and appropriate affect.   Data Reviewed: I have personally reviewed following labs and imaging studies  CBC: Recent Labs  Lab 07/16/21 0401 07/16/21 1112 07/17/21 0448  WBC 4.5  --  5.2  NEUTROABS 3.0  --  3.7  HGB 8.8* 7.1* 12.9*  HCT 27.1* 21.6* 38.7*  MCV 88.3  --  86.6  PLT 114*  --  563   Basic Metabolic Panel: Recent Labs  Lab 07/16/21 0401 07/16/21 0558 07/17/21 0448  NA  136  --  137  K 3.4*  --  3.9  CL 102  --  110  CO2 24  --  18*  GLUCOSE 111*  --  83  BUN 17  --  13  CREATININE 1.19  --  1.10  CALCIUM 9.0  --  8.9  MG  --  2.0  --   PHOS  --  3.6  --    GFR: Estimated Creatinine Clearance: 57 mL/min (by C-G formula based on SCr of 1.1 mg/dL). Liver Function Tests: Recent Labs  Lab 07/16/21 0401 07/17/21 0448  AST 181* 62*  ALT 95* 56*  ALKPHOS 55 45  BILITOT 0.5 1.1  PROT 7.4 6.7  ALBUMIN 3.8 3.3*   No results for input(s): "LIPASE", "AMYLASE" in the last 168 hours. No results for input(s): "AMMONIA" in the last 168 hours. Coagulation Profile: Recent Labs  Lab 07/16/21 0401  INR 1.1   Cardiac Enzymes: No results for input(s): "CKTOTAL", "CKMB", "CKMBINDEX", "TROPONINI" in the last 168 hours. BNP (last 3 results) No results for input(s): "PROBNP" in the last 8760 hours. HbA1C: No results for input(s): "HGBA1C" in the last 72 hours. CBG: Recent Labs  Lab 07/17/21 0504 07/17/21 0904 07/17/21 0945 07/17/21 1140  GLUCAP 86 80 75  82   Lipid Profile: No results for input(s): "CHOL", "HDL", "LDLCALC", "TRIG", "CHOLHDL", "LDLDIRECT" in the last 72 hours. Thyroid Function Tests: No results for input(s): "TSH", "T4TOTAL", "[name]4", "T3FREE", "THYROIDAB" in the last 72 hours. Anemia Panel: Recent Labs    07/16/21 0447 07/16/21 0505  VITAMINB12  --  277  FOLATE  --  13.0  FERRITIN  --  883*  TIBC  --  247*  IRON  --  40*  RETICCTPCT 1.1  --    Sepsis Labs: Recent Labs  Lab 07/16/21 0409 07/16/21 0525 07/16/21 1104  LATICACIDVEN 2.3* 2.8* 1.1    Recent Results (from the past 240 hour(s))  Blood Culture (routine x 2)     Status: None (Preliminary result)   Collection Time: 07/16/21  3:54 AM   Specimen: BLOOD  Result Value Ref Range Status   Specimen Description   Final    BLOOD RIGHT ANTECUBITAL Performed at [facility], [place] [address] [place], [place] [address]    Special Requests   Final    BOTTLES DRAWN AEROBIC AND ANAEROBIC Blood Culture adequate volume Performed at [facility] [address], [place], [place] [address]    Culture   Final    NO GROWTH < 24 HOURS Performed at [facility] [address] [place], [place] [address]    Report Status PENDING  Incomplete  Resp Panel by RT-PCR (Flu A&B, Covid) Anterior Nasal Swab     Status: None   Collection Time: 07/16/21  4:05 AM   Specimen: Anterior Nasal Swab  Result Value Ref Range Status   SARS Coronavirus 2 by RT PCR NEGATIVE NEGATIVE Final    Comment: (NOTE) SARS-CoV-2 target nucleic acids are NOT DETECTED.  The SARS-CoV-2 RNA is generally detectable in upper respiratory specimens during the acute phase of infection. The lowest concentration of SARS-CoV-2 viral copies this assay can detect is 138 copies/mL. A negative result does not preclude SARS-Cov-2 infection and should not be used as the sole basis for treatment or other patient management decisions. A negative result may occur with   improper specimen collection/handling, submission of specimen other than nasopharyngeal swab, presence of viral mutation(s) within the areas targeted  by this assay, and inadequate number of viral copies(<138 copies/mL). A negative result must be combined with clinical observations, patient history, and epidemiological information. The expected result is Negative.  Fact Sheet for Patients:  EntrepreneurPulse.com.au  Fact Sheet for Healthcare Providers:  IncredibleEmployment.be  This test is no t yet approved or cleared by the [place] FDA and  has been authorized for detection and/or diagnosis of SARS-CoV-2 by FDA under an Emergency Use Authorization (EUA). This EUA will remain  in effect (meaning this test can be used) for the duration of the COVID-19 declaration under Section 564(b)(1) of the Act, 21 U.S.C.section 360bbb-3(b)(1), unless the authorization is terminated  or revoked sooner.       Influenza A by PCR NEGATIVE NEGATIVE Final   Influenza B by PCR NEGATIVE NEGATIVE Final    Comment: (NOTE) The Xpert Xpress SARS-CoV-2/FLU/RSV plus assay is intended as an aid in the diagnosis of influenza from Nasopharyngeal swab specimens and should not be used as a sole basis for treatment. Nasal washings and aspirates are unacceptable for Xpert Xpress SARS-CoV-2/FLU/RSV testing.  Fact Sheet for Patients: EntrepreneurPulse.com.au  Fact Sheet for Healthcare Providers: IncredibleEmployment.be  This test is not yet approved or cleared by the [place] FDA and has been authorized for detection and/or diagnosis of SARS-CoV-2 by FDA under an Emergency Use Authorization (EUA). This EUA will remain in effect (meaning this test can be used) for the duration of the COVID-19 declaration under Section 564(b)(1) of the Act, 21 U.S.C. section 360bbb-3(b)(1), unless the authorization is terminated  or revoked.  Performed at [facility], [place] [address], [place], [place] [address]   Blood Culture (routine x 2)     Status: None (Preliminary result)   Collection Time: 07/16/21  5:58 AM   Specimen: BLOOD  Result Value Ref Range Status   Specimen Description BLOOD LEFT ANTECUBITAL  Final   Special Requests   Final    BOTTLES DRAWN AEROBIC ONLY Blood Culture adequate volume   Culture   Final    NO GROWTH < 24 HOURS Performed at [facility], [place] [address], [place], [place] [address]    Report Status PENDING  Incomplete  Urine Culture     Status: None   Collection Time: 07/16/21  1:55 PM   Specimen: In/Out Cath Urine  Result Value Ref Range Status   Specimen Description   Final    IN/OUT CATH URINE Performed at [facility] [place] [address] [place], [place] [address]    Special Requests   Final    NONE Performed at [facility], [place] [address], [place], [place] [address]    Culture   Final    NO GROWTH Performed at [facility], [place] [address], [place], [place] [address]    Report Status 07/17/2021 FINAL  Final    Radiology Studies: MR PELVIS W WO CONTRAST  Result Date: 07/16/2021 CLINICAL DATA:  An 81 year old male presents for evaluation of metastatic disease, reportedly with history of "SpaceOAR" placement. EXAM: MRI PELVIS WITHOUT AND WITH CONTRAST TECHNIQUE: Multiplanar multisequence MR imaging of the pelvis was performed both before and after administration of intravenous contrast. CONTRAST:  2m GADAVIST GADOBUTROL 1 MMOL/ML IV SOLN COMPARISON:  CT of the abdomen and pelvis which was performed on July 16, 2021 as well. FINDINGS: Urinary Tract: Urinary bladder with smooth contours. No signs of distal ureteral dilation. Bowel: Marked rectal thickening and perirectal stranding. In the rectoprostatic region there is  evidence of open "SpaceOAR" material separating the prostate from the rectum. This shows  some peripheral enhancement and measures 4.6 by 1.9 cm greatest axial dimension. There is edema throughout the retroperitoneum and edema throughout fascial planes of the pelvis. Vascular/Lymphatic: No signs of adenopathy about the pelvis. Vascular structures not well assessed due to protocol. Vascular structures are of normal caliber. Reproductive: Prostate which is heterogeneous, nonspecific in this patient with known prostate cancer post radiation. Enhancement of much of the gland is demonstrated with lobular appearance presumably diffuse prostate cancer. Other: Stranding throughout the retroperitoneum and throughout fascial planes of the pelvis in the setting of proctitis. Musculoskeletal: No bony lesions of the pelvis. IMPRESSION: 1. Evidence of SpaceOAR material separating the prostate from the rectum. No definite signs of metastatic disease to the pelvis at this time 2. Marked rectal thickening and perirectal stranding, findings compatible with proctitis likely radiation related. Correlate with any signs of infection. 3. Stranding throughout the retroperitoneum and throughout fascial planes of the pelvis in the setting of proctitis. Peripheral enhancement of material in the rectoprostatic region raises the possibility of superinfection of this area though findings remain nonspecific in the setting of radiotherapy. Consider close follow-up given degree of inflammation that tracks throughout the pelvis which is unchanged compared to the CT that was acquired of the same date. 4. No signs of adenopathy about the pelvis with changes of diffuse prostate cancer within the prostate gland. Electronically Signed   By: Zetta Bills M.D.   On: 07/16/2021 17:41   ECHOCARDIOGRAM COMPLETE  Result Date: 07/16/2021    ECHOCARDIOGRAM REPORT   Patient Name:   TRESTEN PANTOJA Date of Exam: 07/16/2021 Medical Rec #:  [id number]       Height:       71.0 in Accession #:    [id number]      Weight:       181.0 lb Date of Birth:   04/21/1940       BSA:          2.021 m Patient Age:    81 years        BP:           159/101 mmHg Patient Gender: M               HR:           72 bpm. Exam Location:  Inpatient Procedure: 2D Echo, Cardiac Doppler and Color Doppler Indications:    Syncope  History:        Patient has prior history of Echocardiogram examinations, most                 recent 11/07/2015. Risk Factors:Hypertension, HLD and Former                 Smoker.  Sonographer:    Joette Catching RCS Referring Phys: [phone] [place]  Sonographer Comments: Technically challenging study due to limited acoustic windows, no parasternal window and no apical window. Image acquisition challenging due to patient body habitus. Study done from subcostal window IMPRESSIONS  1. Left ventricular ejection fraction, by estimation, is 55 to 60%. The left ventricle has normal function. The left ventricle has no regional wall motion abnormalities. Left ventricular diastolic parameters are indeterminate.  2. Right ventricular systolic function is normal. The right ventricular size is normal.  3. The mitral valve is normal in structure. Trivial mitral valve regurgitation. No evidence of mitral stenosis.  4. The aortic valve is tricuspid. There  is mild calcification of the aortic valve. Aortic valve regurgitation is not visualized. No aortic stenosis is present.  5. The inferior vena cava is normal in size with greater than 50% respiratory variability, suggesting right atrial pressure of 3 mmHg.  6. Only subcostal windows available for interpretation. FINDINGS  Left Ventricle: Left ventricular ejection fraction, by estimation, is 55 to 60%. The left ventricle has normal function. The left ventricle has no regional wall motion abnormalities. The left ventricular internal cavity size was normal in size. There is  no left ventricular hypertrophy. Left ventricular diastolic parameters are indeterminate. Right Ventricle: The right ventricular size is normal. No  increase in right ventricular wall thickness. Right ventricular systolic function is normal. Left Atrium: Left atrial size was normal in size. Right Atrium: Right atrial size was normal in size. Pericardium: There is no evidence of pericardial effusion. Mitral Valve: The mitral valve is normal in structure. Trivial mitral valve regurgitation. No evidence of mitral valve stenosis. Tricuspid Valve: The tricuspid valve is normal in structure. Tricuspid valve regurgitation is trivial. No evidence of tricuspid stenosis. Aortic Valve: The aortic valve is tricuspid. There is mild calcification of the aortic valve. Aortic valve regurgitation is not visualized. No aortic stenosis is present. Aortic valve mean gradient measures 4.0 mmHg. Aortic valve peak gradient measures 6.9 mmHg. Aortic valve area, by VTI measures 2.05 cm. Pulmonic Valve: The pulmonic valve was normal in structure. Pulmonic valve regurgitation is not visualized. No evidence of pulmonic stenosis. Aorta: The aortic root is normal in size and structure. Venous: The inferior vena cava is normal in size with greater than 50% respiratory variability, suggesting right atrial pressure of 3 mmHg. IAS/Shunts: No atrial level shunt detected by color flow Doppler.  LEFT VENTRICLE PLAX 2D LVIDd:         4.20 cm LVIDs:         2.90 cm LV PW:         0.70 cm LV IVS:        0.90 cm LVOT diam:     2.10 cm LV SV:         52 LV SV Index:   26 LVOT Area:     3.46 cm  IVC IVC diam: 1.70 cm LEFT ATRIUM         Index LA diam:    2.80 cm 1.39 cm/m  AORTIC VALVE                    PULMONIC VALVE AV Area (Vmax):    2.38 cm     PV Vmax:       0.84 m/s AV Area (Vmean):   2.06 cm     PV Peak grad:  2.8 mmHg AV Area (VTI):     2.05 cm AV Vmax:           131.00 cm/s AV Vmean:          96.900 cm/s AV VTI:            0.255 m AV Peak Grad:      6.9 mmHg AV Mean Grad:      4.0 mmHg LVOT Vmax:         90.10 cm/s LVOT Vmean:        57.600 cm/s LVOT VTI:          0.151 m LVOT/AV VTI  ratio: 0.59  AORTA Ao Root diam: 3.70 cm MITRAL VALVE  TRICUSPID VALVE MV Area (PHT): 4.49 cm    TR Peak grad:   27.7 mmHg MV Decel Time: 169 msec    TR Vmax:        263.00 cm/s MV E velocity: 53.10 cm/s MV A velocity: 60.00 cm/s  SHUNTS MV E/A ratio:  0.89        Systemic VTI:  0.15 m                            Systemic Diam: 2.10 cm Glori Bickers MD Electronically signed by Glori Bickers MD Signature Date/Time: 07/16/2021/4:27:20 PM    Final    CT Angio Chest PE W and/or Wo Contrast  Result Date: 07/16/2021 CLINICAL DATA:  Pulmonary embolism suspected. High probability. Nonlocalized abdominal pain with occasional right upper lobe distribution. Fever and syncope. Possible sepsis. EXAM: CT ANGIOGRAPHY CHEST CT ABDOMEN AND PELVIS WITH CONTRAST TECHNIQUE: Multidetector CT imaging of the chest was performed using the standard protocol during bolus administration of intravenous contrast. Multiplanar CT image reconstructions and MIPs were obtained to evaluate the vascular anatomy. Multidetector CT imaging of the abdomen and pelvis was performed using the standard protocol during bolus administration of intravenous contrast. RADIATION DOSE REDUCTION: This exam was performed according to the departmental dose-optimization program which includes automated exposure control, adjustment of the mA and/or kV according to patient size and/or use of iterative reconstruction technique. CONTRAST:  132m OMNIPAQUE IOHEXOL 350 MG/ML SOLN COMPARISON:  CTA chest 10/23/2013, portable chest today portable chest 10/25/2020, CT abdomen and pelvis with contrast 10/25/2020, and CT abdomen and pelvis with contrast 04/08/2012. FINDINGS: CTA CHEST FINDINGS Cardiovascular: There stable proximal thoracic aortic ectasia up to 3.8 cm, scattered aortic calcific plaques. There is no dissection or aneurysm no penetrating ulcer. The great vessels opacify well with normal variant brachiobocarotid trunk. The cardiac size is normal.  There is no pericardial effusion or visible coronary artery calcification. The pulmonary arteries are normal in caliber and clear at least to the segmental level but the subsegmental arterial bed is obscured by breathing motion. Pulmonary veins are decompressed. Mediastinum/Nodes: Mild chronic elevation right hemidiaphragm. There is no thyroid or axillary mass. There is again noted prominent AP window lymph node measuring 2.7 by 1.1 cm not significantly changed. There is no further intrathoracic adenopathy. The trachea and esophagus are unremarkable. Lungs/Pleura: There is minimal paraseptal emphysematous change in the right apex. No pulmonary consolidation mass or effusion seen. Evaluation of the lungs technically limited due to respiratory motion. Musculoskeletal: There are degenerative changes of the thoracic spine multilevel degenerative discs and Schmorl's nodes and mild chronic anterior wedging of the T7 vertebral body. There are facet spurs and multilevel thoracic foraminal stenosis. There is no concerning regional bone lesion. No chest wall lesion is seen. Review of the MIP images confirms the above findings. CT ABDOMEN and PELVIS FINDINGS Comment: Abundant breathing motion limits this study as well. Hepatobiliary: No abnormality allowing for breathing motion. The gallbladder and bile ducts are unremarkable as visualized. Pancreas: No focal abnormality is seen through the breathing motion. Spleen: Unremarkable. Adrenals/Urinary Tract: There are bilateral renal cysts, largest is septated versus 2 abutting cysts in aggregate measuring 6.5 cm left lower pole. Stable right renal cyst measuring 3.6 cm. There is no solid renal mass enhancement. There is no adrenal mass. There is no urinary stone or obstruction. Diffusely thickened bladder seen with perivesical stranding. Stomach/Bowel: The stomach and unopacified small bowel are unremarkable. There is fluid in the  colon. The appendix is normal. There are  left-sided uncomplicated diverticula. Rectum is diffusely circumferentially thickened with surrounding stranding. Vascular/Lymphatic: Aortic atherosclerosis. No definitive adenopathy. Reproductive: Mild prostatomegaly. Fiducial markers have been placed in the prostate since the prior study. There is stranding around the prostate continuing from the area around the bladder. Both of the testicles are retracted up into the inguinal canals. Other: On series 3 axial 81-88 there is an ill-defined area of enhancement in between the rectum and the seminal vesicles and prostate measuring 4.1 x 1.6 by 4.4 cm extends down to the pelvic floor but not below the urogenital diaphragm. Some images suggest this could be serpiginous vascular structure such as localized pelvic venous congestion but could also be a small contrast extravasation into pelvic floor from pelvic floor blood vessels or potentially from the posterior prostate. Another possibility would be an enhancing metastatic mass but a pelvic metastasis should not be this densely enhancing. In addition to diffuse stranding in the pelvis from the level of the bladder dome to floor there is mild ascites along the pelvic sidewalls tracking to the distal paracolic gutters and into the deep pelvic recesses. There is no free air and no abscess is seen. There are small inguinal fat hernias. Musculoskeletal: There are degenerative changes of the lumbar spine there is partial ankylosis across the L4-5 interspace ankylosis over the facet joints at this level. No worrisome regional bone lesion is seen. Ankylosis both SI joints. Review of the MIP images confirms the above findings. IMPRESSION: 1. The patient has most likely had radiation therapy to the prostate and surrounding tissues. There is diffuse stranding in the pelvis from the bladder dome inferiorly, bladder thickening which is probably radiation related versus cystitis, diffusely thickened rectum probably also XRT related or  could be infectious proctitis. 2. There is ill-defined dense enhancement in between the rectum, seminal vesicles and prostate over an area of 4.1 x 1.6 x 4.4 cm, uncertain etiology not present on the prior scans. Some images suggest this could be some sort of pelvic venous congestive phenomenon. Also possible this could be pelvic contrast extravasation from the pelvic floor or prostate blood vessels, but it does not have a fluid appearance per se, and his prostate biopsy was several months ago therefore this would be unlikely. Other possibility would be an ill-defined metastatic mass. MRI without and with contrast may be helpful. 3. Mild pelvic ascites. 4. No pulmonary arterial embolus at least through the segmental arteries. The subsegmental arterial bed largely obscured due to breathing motion. 5. Aortic atherosclerosis with stable proximal aortic ectasia. 6. Both testicles retracted into the inguinal canals. 7. Remaining findings described above. Electronically Signed   By: Telford Nab M.D.   On: 07/16/2021 06:37   CT ABDOMEN PELVIS W CONTRAST  Result Date: 07/16/2021 CLINICAL DATA:  Pulmonary embolism suspected. High probability. Nonlocalized abdominal pain with occasional right upper lobe distribution. Fever and syncope. Possible sepsis. EXAM: CT ANGIOGRAPHY CHEST CT ABDOMEN AND PELVIS WITH CONTRAST TECHNIQUE: Multidetector CT imaging of the chest was performed using the standard protocol during bolus administration of intravenous contrast. Multiplanar CT image reconstructions and MIPs were obtained to evaluate the vascular anatomy. Multidetector CT imaging of the abdomen and pelvis was performed using the standard protocol during bolus administration of intravenous contrast. RADIATION DOSE REDUCTION: This exam was performed according to the departmental dose-optimization program which includes automated exposure control, adjustment of the mA and/or kV according to patient size and/or use of iterative  reconstruction technique. CONTRAST:  128m OMNIPAQUE IOHEXOL 350 MG/ML SOLN COMPARISON:  CTA chest 10/23/2013, portable chest today portable chest 10/25/2020, CT abdomen and pelvis with contrast 10/25/2020, and CT abdomen and pelvis with contrast 04/08/2012. FINDINGS: CTA CHEST FINDINGS Cardiovascular: There stable proximal thoracic aortic ectasia up to 3.8 cm, scattered aortic calcific plaques. There is no dissection or aneurysm no penetrating ulcer. The great vessels opacify well with normal variant brachiobocarotid trunk. The cardiac size is normal. There is no pericardial effusion or visible coronary artery calcification. The pulmonary arteries are normal in caliber and clear at least to the segmental level but the subsegmental arterial bed is obscured by breathing motion. Pulmonary veins are decompressed. Mediastinum/Nodes: Mild chronic elevation right hemidiaphragm. There is no thyroid or axillary mass. There is again noted prominent AP window lymph node measuring 2.7 by 1.1 cm not significantly changed. There is no further intrathoracic adenopathy. The trachea and esophagus are unremarkable. Lungs/Pleura: There is minimal paraseptal emphysematous change in the right apex. No pulmonary consolidation mass or effusion seen. Evaluation of the lungs technically limited due to respiratory motion. Musculoskeletal: There are degenerative changes of the thoracic spine multilevel degenerative discs and Schmorl's nodes and mild chronic anterior wedging of the T7 vertebral body. There are facet spurs and multilevel thoracic foraminal stenosis. There is no concerning regional bone lesion. No chest wall lesion is seen. Review of the MIP images confirms the above findings. CT ABDOMEN and PELVIS FINDINGS Comment: Abundant breathing motion limits this study as well. Hepatobiliary: No abnormality allowing for breathing motion. The gallbladder and bile ducts are unremarkable as visualized. Pancreas: No focal abnormality is seen  through the breathing motion. Spleen: Unremarkable. Adrenals/Urinary Tract: There are bilateral renal cysts, largest is septated versus 2 abutting cysts in aggregate measuring 6.5 cm left lower pole. Stable right renal cyst measuring 3.6 cm. There is no solid renal mass enhancement. There is no adrenal mass. There is no urinary stone or obstruction. Diffusely thickened bladder seen with perivesical stranding. Stomach/Bowel: The stomach and unopacified small bowel are unremarkable. There is fluid in the colon. The appendix is normal. There are left-sided uncomplicated diverticula. Rectum is diffusely circumferentially thickened with surrounding stranding. Vascular/Lymphatic: Aortic atherosclerosis. No definitive adenopathy. Reproductive: Mild prostatomegaly. Fiducial markers have been placed in the prostate since the prior study. There is stranding around the prostate continuing from the area around the bladder. Both of the testicles are retracted up into the inguinal canals. Other: On series 3 axial 81-88 there is an ill-defined area of enhancement in between the rectum and the seminal vesicles and prostate measuring 4.1 x 1.6 by 4.4 cm extends down to the pelvic floor but not below the urogenital diaphragm. Some images suggest this could be serpiginous vascular structure such as localized pelvic venous congestion but could also be a small contrast extravasation into pelvic floor from pelvic floor blood vessels or potentially from the posterior prostate. Another possibility would be an enhancing metastatic mass but a pelvic metastasis should not be this densely enhancing. In addition to diffuse stranding in the pelvis from the level of the bladder dome to floor there is mild ascites along the pelvic sidewalls tracking to the distal paracolic gutters and into the deep pelvic recesses. There is no free air and no abscess is seen. There are small inguinal fat hernias. Musculoskeletal: There are degenerative changes of  the lumbar spine there is partial ankylosis across the L4-5 interspace ankylosis over the facet joints at this level. No worrisome regional bone lesion is seen. Ankylosis both  SI joints. Review of the MIP images confirms the above findings. IMPRESSION: 1. The patient has most likely had radiation therapy to the prostate and surrounding tissues. There is diffuse stranding in the pelvis from the bladder dome inferiorly, bladder thickening which is probably radiation related versus cystitis, diffusely thickened rectum probably also XRT related or could be infectious proctitis. 2. There is ill-defined dense enhancement in between the rectum, seminal vesicles and prostate over an area of 4.1 x 1.6 x 4.4 cm, uncertain etiology not present on the prior scans. Some images suggest this could be some sort of pelvic venous congestive phenomenon. Also possible this could be pelvic contrast extravasation from the pelvic floor or prostate blood vessels, but it does not have a fluid appearance per se, and his prostate biopsy was several months ago therefore this would be unlikely. Other possibility would be an ill-defined metastatic mass. MRI without and with contrast may be helpful. 3. Mild pelvic ascites. 4. No pulmonary arterial embolus at least through the segmental arteries. The subsegmental arterial bed largely obscured due to breathing motion. 5. Aortic atherosclerosis with stable proximal aortic ectasia. 6. Both testicles retracted into the inguinal canals. 7. Remaining findings described above. Electronically Signed   By: Telford Nab M.D.   On: 07/16/2021 06:37   CT Head Wo Contrast  Result Date: 07/16/2021 CLINICAL DATA:  Moderate to severe head trauma. EXAM: CT HEAD WITHOUT CONTRAST TECHNIQUE: Contiguous axial images were obtained from the base of the skull through the vertex without intravenous contrast. RADIATION DOSE REDUCTION: This exam was performed according to the departmental dose-optimization program  which includes automated exposure control, adjustment of the mA and/or kV according to patient size and/or use of iterative reconstruction technique. COMPARISON:  Head CT 02/10/2010 FINDINGS: Brain: There is interval progression of mild cerebral atrophy, small-vessel disease and atrophic ventriculomegaly compared to the prior study. There is slight cerebellar atrophy also increased. No asymmetry is seen concerning for an acute infarct, hemorrhage or mass. There is no midline shift. Basal cisterns are clear. Vascular: No hyperdense vessel or unexpected calcification. Skull: There is no visible scalp hematoma, no depressed skull fracture or focal skull lesion is seen. Sinuses/Orbits: There is mild membrane thickening in the right maxillary and bilateral ethmoid sinus air cells without fluid level or other significant visualized sinus disease. Other: There is no mastoid effusion. Mastoid pneumatization continues into both petrous apices and the posterior squamous temporal bones. IMPRESSION: No acute intracranial CT findings or depressed skull fractures. Progressive mild atrophy and small-vessel disease since 02/10/2010. Electronically Signed   By: Telford Nab M.D.   On: 07/16/2021 05:42   DG Chest Port 1 View  Result Date: 07/16/2021 CLINICAL DATA:  Questionable sepsis.  History of prostate cancer. EXAM: PORTABLE CHEST 1 VIEW COMPARISON:  Portable chest 10/25/2020. FINDINGS: The cardiac size is normal. No vascular congestion is seen. There is mild ectasia and tortuosity of the aorta with a stable mediastinum. The lungs are clear. No pleural effusion is seen or evidence of pneumothorax. Thoracic spondylosis. IMPRESSION: No evidence of acute chest disease or interval changes. Electronically Signed   By: Telford Nab M.D.   On: 07/16/2021 05:35     Scheduled Meds:  feeding supplement  237 mL Oral TID BM   latanoprost  1 drop Both Eyes QHS   multivitamin with minerals  1 tablet Oral Daily   pantoprazole  (PROTONIX) IV  40 mg Intravenous Q12H   sodium chloride flush  3 mL Intravenous Q12H  Continuous Infusions:  ceFEPime (MAXIPIME) IV 2 g (07/17/21 1057)   dextrose 5 % and 0.45 % NaCl with KCl 20 mEq/L 75 mL/hr at 07/17/21 0934   metronidazole 500 mg (07/17/21 1553)   vancomycin 1,500 mg (07/16/21 2249)    LOS: 1 day   Raiford Noble, DO [facility] Available via Epic secure chat 7am-7pm After these hours, please refer to coverage provider listed on [email or web address] 07/17/2021, 7:46 PM

## 2021-07-17 NOTE — TOC Initial Note (Signed)
Transition of Care [facility]) - Initial/Assessment Note    Patient Details  Name: [name] MRN: [id number] Date of Birth: 11-16-1940  Transition [facility]) CM/SW Contact:    Leeroy Cha, RN Phone Number: 07/17/2021, 8:04 AM  Clinical Narrative:                  Transition of Care [facility]) Screening Note   Patient Details  Name: [name] Date of Birth: 1940/10/07   Transition of Care [facility]) CM/SW Contact:    Leeroy Cha, RN Phone Number: 07/17/2021, 8:04 AM    Transition of Care Department (TOC) has reviewed patient and no TOC needs have been identified at this time. We will continue to monitor patient advancement through interdisciplinary progression rounds. If new patient transition needs arise, please place a TOC consult.    Expected Discharge Plan: Skilled Nursing Facility Barriers to Discharge: Continued Medical Work up   Patient Goals and CMS Choice Patient states their goals for this hospitalization and ongoing recovery are:: not stated CMS Medicare.gov Compare Post Acute Care list provided to:: Patient Choice offered to / list presented to : Patient  Expected Discharge Plan and Services Expected Discharge Plan: [place]   Discharge Planning Services: CM Consult   Living arrangements for the past 2 months: Single Family Home                                      Prior Living Arrangements/Services Living arrangements for the past 2 months: Single Family Home Lives with:: Self (widowed) Patient language and need for interpreter reviewed:: Yes Do you feel safe going back to the place where you live?: Yes            Criminal Activity/Legal Involvement Pertinent to Current Situation/Hospitalization: No - Comment as needed  Activities of Daily Living Home Assistive Devices/Equipment: Eyeglasses, Dentures (specify type) (full set dentures) ADL Screening (condition at time of admission) Patient's cognitive ability  adequate to safely complete daily activities?: Yes Is the patient deaf or have difficulty hearing?: Yes Does the patient have difficulty seeing, even when wearing glasses/contacts?: No Does the patient have difficulty concentrating, remembering, or making decisions?: No Patient able to express need for assistance with ADLs?: Yes Does the patient have difficulty dressing or bathing?: No Independently performs ADLs?: Yes (appropriate for developmental age) Does the patient have difficulty walking or climbing stairs?: No Weakness of Legs: Both Weakness of Arms/Hands: Both  Permission Sought/Granted                  Emotional Assessment Appearance:: Appears stated age     Orientation: : Oriented to Self, Oriented to Place, Oriented to  Time, Oriented to Situation Alcohol / Substance Use: Not Applicable, Never Used Psych Involvement: No (comment)  Admission diagnosis:  Syncope and collapse [R55] Sepsis due to undetermined organism ([place]) [A41.9] Anemia, unspecified type [D64.9] Patient Active Problem List   Diagnosis Date Noted   Sepsis due to undetermined organism ([place]) 07/16/2021   Hypokalemia 07/16/2021   Syncope 07/16/2021   Aortic atherosclerosis ([place]) 07/16/2021   Acute blood loss anemia 07/16/2021   Malignant neoplasm of prostate ([place]) 02/15/2021   Elevated LFTs 10/31/2020   CKD (chronic kidney disease) stage 3, GFR 30-59 ml/min ([place]) 10/30/2020   Gastrointestinal symptoms 10/30/2020   Decreased hearing of both ears 06/01/2020   Gout, chronic, without tophus 10/12/2015  Counseling regarding end of life decision making 04/13/2015   OA (osteoarthritis) of knee 11/05/2013   BPH (benign prostatic hyperplasia) 08/04/2013   Mild intermittent asthma, uncomplicated 54/36/0677   Healthcare maintenance 11/26/2012   Glaucoma 06/12/2012   Hx of aortic root dilation ([place]), resolved 01/10/2012   Vitamin B 12 deficiency 02/21/2010   PCP:  Angelica Pou, MD Pharmacy:    [facility], [place] - [address] [place] [address] Phone: [phone] Fax: [phone]  [id number] [place] [id number]: [phone]: [phone]    Social Determinants of Health (SDOH) Interventions    Readmission Risk Interventions     No data to display

## 2021-07-17 NOTE — Hospital Course (Signed)
HPI per Dr. Tennis Must on 07/16/21  [name] is a 81 y.o. male with medical history significant of BPH, prostate cancer, mild intermittent asthma, osteoarthritis, depression, HSV 1 and HSV 2 infections, history of colitis, diverticulosis, diverticulitis, LLE DVT, history of pulmonary embolism, history of neurocardiogenic syncopal episode, hypertension, hyperlipidemia who underwent radiation therapy on Friday for prostate cancer and came to the emergency department due to have a syncopal episode yesterday evening and another one this morning after going to the bathroom with loss of continence and bladder.  There was no tonic-clonic movements or postictal.  He has also been having epigastric and lower abdominal pain.  He has been having fevers/chills for the past 3 days associated with rhinorrhea and sneezing.  He denied cough, wheezing, sore throat,  or hemoptysis.  No chest pain, palpitations, diaphoresis, nausea or emesis or any prodromal symptoms.No PND, orthopnea or pitting edema of the lower extremities.  No nausea, emesis, diarrhea, constipation, melena or hematochezia.  Positive frequency, but no flank pain, dysuria,or hematuria.  No polyuria, polydipsia, polyphagia or blurred vision.   **Interim History CTA done showed no evidence of PE.  He did have diffuse stranding in the pelvis from the bladder dome anteriorly bilateral thickening secondary to radiation versus cystitis and an ill-defined dense enhancement in between the rectum, seminal vesicles and prostate over an area of 4.1 x 1.6 x 4.4 cm of unknown etiology.  Gastroenterology was consulted given that he has anemia with positive FOBT.  He was initiated on IV Protonix twice daily and an MRI of his pelvis was ordered to investigate the mesorectal abnormality seen on his CT scan.  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  Urology deferred to GI for need for flex sig but their impression was to hold off any urological procedure given unless he has active bleeding.

## 2021-07-17 NOTE — Progress Notes (Addendum)
[facility] Progress Note  TAITEN BRAWN 81 y.o. 1940/05/08  CC:  Anemia, positive FOBT, abnormal CT scan   Subjective: Patient states he is doing well today. Denies nausea/vomiting. Denies abdominal pain. Tolerating diet well.  ROS : Review of Systems  Constitutional:  Negative for chills and fever.  Gastrointestinal:  Negative for abdominal pain, blood in stool, constipation, diarrhea, heartburn, melena, nausea and vomiting.      Objective: Vital signs in last 24 hours: Vitals:   07/17/21 0515 07/17/21 0837  BP:  137/73  Pulse:  73  Resp: 17 20  Temp:  98.3 F (36.8 C)  SpO2:  100%    Physical Exam:  General:  Alert, cooperative, no distress, appears stated age  Head:  Normocephalic, without obvious abnormality, atraumatic  Eyes:  Anicteric sclera, EOM's intact  Lungs:   Clear to auscultation bilaterally, respirations unlabored  Heart:  Regular rate and rhythm, S1, S2 normal  Abdomen:   Soft, non-tender, bowel sounds active all four quadrants,  no masses,     Lab Results: Recent Labs    07/16/21 0401 07/16/21 0558 07/17/21 0448  NA 136  --  137  K 3.4*  --  3.9  CL 102  --  110  CO2 24  --  18*  GLUCOSE 111*  --  83  BUN 17  --  13  CREATININE 1.19  --  1.10  CALCIUM 9.0  --  8.9  MG  --  2.0  --   PHOS  --  3.6  --    Recent Labs    07/16/21 0401 07/17/21 0448  AST 181* 62*  ALT 95* 56*  ALKPHOS 55 45  BILITOT 0.5 1.1  PROT 7.4 6.7  ALBUMIN 3.8 3.3*   Recent Labs    07/16/21 0401 07/16/21 1112 07/17/21 0448  WBC 4.5  --  5.2  NEUTROABS 3.0  --  3.7  HGB 8.8* 7.1* 12.9*  HCT 27.1* 21.6* 38.7*  MCV 88.3  --  86.6  PLT 114*  --  163   Recent Labs    07/16/21 0401  LABPROT 14.2  INR 1.1      Assessment Anemia, positive FOBT - hgb 12.9 - no leukocytosis - iron 40, TIBC 247 - ferritin 883 - Vit b 12 277 - BUN 13, Cr. 1.10 - CT ab/pelvis w contrast: ill-defined dense enhancement between rectum and seminal vesicles  and prostate. Possibly metastatic mass. S/p prostate radiation -MRI pelvis: Rectal thickening and perirectal stranding consistent with proctitis likely radiation related.  Stranding throughout retroperitoneum and throughout fascial planes of the pelvis in the setting of proctitis.  Peripheral enhancement of material in the rectal prostatic region raises possibility of superinfection of this area though findings remain nonspecific.  Plan: MRI consistent with findings from radiation proctitis. Not having symptoms at this time. Improvement with hgb s/p 2 units PRBCs. If symptoms recur, could consider flex sigmoidoscopy inpatient versus outpatient for evaluation of radiation proctitis however infection is of highest priority at this time. Recommend urology consult. Continue supportive care [facility] will follow  Garnette Scheuermann PA-C 07/17/2021, 10:50 AM  Contact #  [phone]

## 2021-07-18 ENCOUNTER — Other Ambulatory Visit: Payer: Self-pay

## 2021-07-18 ENCOUNTER — Ambulatory Visit
Admission: RE | Admit: 2021-07-18 | Discharge: 2021-07-18 | Disposition: A | Discharge status: Home / Self Care | Payer: Medicare HMO | Source: Ambulatory Visit | Attending: Radiation Oncology | Admitting: Radiation Oncology

## 2021-07-18 DIAGNOSIS — C61 Malignant neoplasm of prostate: Secondary | ICD-10-CM | POA: Diagnosis not present

## 2021-07-18 DIAGNOSIS — Z191 Hormone sensitive malignancy status: Secondary | ICD-10-CM | POA: Diagnosis not present

## 2021-07-18 DIAGNOSIS — A419 Sepsis, unspecified organism: Secondary | ICD-10-CM | POA: Diagnosis not present

## 2021-07-18 DIAGNOSIS — Z510 Encounter for antineoplastic radiation therapy: Secondary | ICD-10-CM | POA: Diagnosis not present

## 2021-07-18 LAB — RAD ONC ARIA SESSION SUMMARY
Course Elapsed Days: 20
Plan Fractions Treated to Date: 14
Plan Prescribed Dose Per Fraction: 1.8 Gy
Plan Total Fractions Prescribed: 25
Plan Total Prescribed Dose: 45 Gy
Reference Point Dosage Given to Date: 25.2 Gy
Reference Point Session Dosage Given: 1.8 Gy
Session Number: 14

## 2021-07-18 LAB — GLUCOSE, CAPILLARY: Glucose-Capillary: 99 mg/dL (ref 70–99)

## 2021-07-18 MED ORDER — SODIUM CHLORIDE 0.9 % IV SOLN
2.0000 g | INTRAVENOUS | Status: DC
  Administered 2021-07-18: 2 g via INTRAVENOUS
  Filled 2021-07-18: qty 20

## 2021-07-18 NOTE — Plan of Care (Signed)
  Problem: Respiratory: Goal: Ability to maintain adequate ventilation will improve Outcome: Progressing   Problem: Education: Goal: Knowledge of General Education information will improve Description: Including pain rating scale, medication(s)/side effects and non-pharmacologic comfort measures Outcome: Progressing   Problem: Activity: Goal: Risk for activity intolerance will decrease Outcome: Progressing   Problem: Coping: Goal: Level of anxiety will decrease Outcome: Progressing   Problem: Elimination: Goal: Will not experience complications related to urinary retention Outcome: Progressing   Problem: Pain Managment: Goal: General experience of comfort will improve Outcome: Progressing   Problem: Safety: Goal: Ability to remain free from injury will improve Outcome: Progressing   Problem: Skin Integrity: Goal: Risk for impaired skin integrity will decrease Outcome: Progressing   

## 2021-07-18 NOTE — Progress Notes (Signed)
PROGRESS NOTE    [name]  ZHY:[id number] DOB: 06-Apr-1940 DOA: 07/16/2021 PCP: Angelica Pou, MD     Brief Narrative:   [name] is a 81 y.o. male with medical history significant of BPH, prostate cancer, mild intermittent asthma, osteoarthritis, depression, HSV 1 and HSV 2 infections, history of colitis, diverticulosis, diverticulitis, LLE DVT, history of pulmonary embolism, history of neurocardiogenic syncopal episode, hypertension, hyperlipidemia who underwent radiation therapy on Friday for prostate cancer and came to the emergency department due to have a syncopal episodes.  He has also been having epigastric and lower abdominal pain.  He has been having fevers/chills for the past 3 days associated with rhinorrhea and sneezing.   **Interim History CTA done showed no evidence of PE.  He did have diffuse stranding in the pelvis from the bladder dome anteriorly bilateral thickening secondary to radiation versus cystitis and an ill-defined dense enhancement in between the rectum, seminal vesicles and prostate over an area of 4.1 x 1.6 x 4.4 cm of unknown etiology.  Gastroenterology was consulted given that he has anemia with positive FOBT.  He was initiated on IV Protonix twice daily and an MRI of his pelvis was ordered to investigate the mesorectal abnormality seen on his CT scan.  GI recommended considering a colonoscopy inpatient versus outpatient and urology was consulted for his radiation proctitis and his MRI was performed and was suggestive of extensive proctitis with inflammation in the mesorectal space with possible infection.  Urology deferred to GI for need for flex sig but their impression was to hold off any urological procedure given unless he has active bleeding.    Subjective:  Feeling dizzy when standing up Denies pain, no n/v, last bm this morning, denies blood  Still feeling weaker than normal  Get orthostatic vital signs Increase activity as  tolerated Rad onc gi Urology Hgb   Assessment & Plan:  Principal Problem:   Sepsis due to undetermined organism [facility]) Active Problems:   Mild intermittent asthma, uncomplicated   Glaucoma   Elevated LFTs   Malignant neoplasm of prostate (HCC)   Hypokalemia   Syncope   Aortic atherosclerosis (HCC)   Acute blood loss anemia     Sepsis ruled in present on admission -with fever 101, hypotension, tachypnea, proctitis -Blood culture no growth, urine culture no growth -Received Vanco, cefepime  and Flagyl, DC Vanco and cefepime, changed to Rocephin, continue Flagyl -Improving consider discharge on Omnicef and Flagyl  GI bleed from proctitis/acute blood loss anemia Status post 2 units PRBC transfusion Report GI bleed has stopped Seen by GI, no plan for scope during this hospitalization But will need close follow-up with GI outpatient to consider outpatient colonoscopy  Syncope in the setting of sepsis and blood loss anemia, present on admission -CT head no acute findings -CT angio chest no PE -Troponin negative -Telemetry unremarkable -will get orthostatic vital signs  LFT elevation CT ab: Hepatobiliary: No abnormality allowing for breathing motion. The gallbladder and bile ducts are unremarkable as visualized. Does not appear to have ab pain, no n/v Elevated lft from sepsis? Will check hepatitis panel, ck level Trend lft   HTN Presented with sepsis hypotension Home BP meds held will get orthostatic vital signs, as still report feeling weak and dizzy when standing up Resume BP meds when able  Localized prostate cancer S/p Placement of fiducial markers into prostate and Insertion of SpaceOAR hydrogel on 5/17 by Dr Gloriann Loan Getting XRT Plan per urology and rad onc  Nutritional Assessment: The patient's BMI is: Body mass index is 25.58 kg/m.Marland Kitchen Seen by dietician.  I agree with the assessment and plan as outlined below: Nutrition Status: Nutrition Problem: Increased  nutrient needs Etiology: acute illness, cancer and cancer related treatments Signs/Symptoms: estimated needs Interventions: Ensure Enlive (each supplement provides 350kcal and 20 grams of protein), MVI  .    I have Reviewed nursing notes, Vitals, pain scores, I/o's, Lab results and  imaging results since pt's last encounter, details please see discussion above  I ordered the following labs:  Unresulted Labs (From admission, onward)     Start     Ordered   07/19/21 0500  Hepatitis panel, acute  Tomorrow morning,   R        07/18/21 1857   07/19/21 0500  CK  Tomorrow morning,   R        07/18/21 1857   07/19/21 0500  CBC  Tomorrow morning,   R        07/18/21 1857   07/19/21 0500  Comprehensive metabolic panel  Tomorrow morning,   R        07/18/21 1857             DVT prophylaxis: SCDs Start: 07/16/21 0746   Code Status:   Code Status: Full Code  Family Communication: Patient Disposition:    Dispo: The patient is from: Home              Anticipated d/c is to: Home              Anticipated d/c date is: Likely on 6/22 if hemoglobin stable, orthostatic vital sign stable  Antimicrobials:    Anti-infectives (From admission, onward)    Start     Dose/Rate Route Frequency Ordered Stop   07/18/21 2100  cefTRIAXone (ROCEPHIN) 2 g in sodium chloride 0.9 % 100 mL IVPB        2 g 200 mL/hr over 30 Minutes Intravenous Every 24 hours 07/18/21 1425     07/16/21 2200  vancomycin (VANCOREADY) IVPB 1500 mg/300 mL  Status:  Discontinued        1,500 mg 150 mL/hr over 120 Minutes Intravenous Every 24 hours 07/16/21 1333 07/18/21 1425   07/16/21 1700  ceFEPIme (MAXIPIME) 2 g in sodium chloride 0.9 % 100 mL IVPB  Status:  Discontinued        2 g 200 mL/hr over 30 Minutes Intravenous Every 12 hours 07/16/21 1236 07/18/21 1425   07/16/21 1700  metroNIDAZOLE (FLAGYL) IVPB 500 mg        500 mg 100 mL/hr over 60 Minutes Intravenous Every 12 hours 07/16/21 1236 07/23/21 1659   07/16/21  0500  ceFEPIme (MAXIPIME) 2 g in sodium chloride 0.9 % 100 mL IVPB        2 g 200 mL/hr over 30 Minutes Intravenous  Once 07/16/21 0445 07/16/21 0550   07/16/21 0500  metroNIDAZOLE (FLAGYL) IVPB 500 mg        500 mg 100 mL/hr over 60 Minutes Intravenous  Once 07/16/21 0445 07/16/21 0618   07/16/21 0500  vancomycin (VANCOCIN) IVPB 1000 mg/200 mL premix        1,000 mg 200 mL/hr over 60 Minutes Intravenous  Once 07/16/21 0445 07/16/21 0702          Objective: Vitals:   07/18/21 0619 07/18/21 1154 07/18/21 1156 07/18/21 1159  BP: 128/68 (!) 143/75 (!) 141/78 132/75  Pulse:  60 63 62  Resp:  Temp:      TempSrc:      SpO2:  100% 100% 93%  Weight:      Height:        Intake/Output Summary (Last 24 hours) at 07/18/2021 1901 Last data filed at 07/18/2021 1700 Gross per 24 hour  Intake 2964.87 ml  Output 1000 ml  Net 1964.87 ml   Filed Weights   07/16/21 0347 07/17/21 0500 07/18/21 0500  Weight: 82.1 kg 79.5 kg 83.2 kg    Examination:  General exam: alert, awake, communicative,calm, NAD Respiratory system: Clear to auscultation. Respiratory effort normal. Cardiovascular system:  RRR.  Gastrointestinal system: Abdomen is nondistended, soft and nontender.  Normal bowel sounds heard. Central nervous system: Alert and oriented. No focal neurological deficits. Extremities:  no edema Skin: No rashes, lesions or ulcers Psychiatry: Judgement and insight appear normal. Mood & affect appropriate.     Data Reviewed: I have personally reviewed  labs and visualized  imaging studies since the last encounter and formulate the plan        Scheduled Meds:  feeding supplement  237 mL Oral TID BM   latanoprost  1 drop Both Eyes QHS   multivitamin with minerals  1 tablet Oral Daily   pantoprazole (PROTONIX) IV  40 mg Intravenous Q12H   sodium chloride flush  3 mL Intravenous Q12H   Continuous Infusions:  cefTRIAXone (ROCEPHIN)  IV     metronidazole 500 mg (07/18/21 1609)      LOS: 2 days   Florencia Reasons, MD PhD FACP [facility]  Available via Epic secure chat 7am-7pm for nonurgent issues Please page for urgent issues To page the attending provider between 7A-7P or the covering provider during after hours 7P-7A, please log into the web site [email or web address] and access using universal [place] password for that web site. If you do not have the password, please call the hospital operator.    07/18/2021, 7:01 PM

## 2021-07-18 NOTE — Evaluation (Signed)
Physical Therapy Evaluation Patient Details Name: [name] MRN: [id number] DOB: Oct 06, 1940 Today's Date: 07/18/2021  History of Present Illness  [name] is a 81 y.o. male presents to the emergency department with syncope at home and fever. Transfused 2 units of PRBC, MRI shows extensive proctitis. Pt admitted 6/19 with sepsis due to undetermined organism. PMH: prostate cancer currently receiving radiation therapy  Clinical Impression  Pt admitted with above diagnosis. Pt ind without AD at baseline, lives with son currently, sometimes stays with sister or brother. Pt currently slightly unsteady without overt LOB when ambulating without AD, has wide BOS with increased lateral weight shifting and decreased cadence. Educated pt on amb daily with nursing as able, skilled PT interventions and time OOB and pt verbalizes understanding. Pt currently with functional limitations due to the deficits listed below (see PT Problem List). Pt will benefit from skilled PT to increase their independence and safety with mobility to allow discharge to the venue listed below.          Recommendations for follow up therapy are one component of a multi-disciplinary discharge planning process, led by the attending physician.  Recommendations may be updated based on patient status, additional functional criteria and insurance authorization.  Follow Up Recommendations No PT follow up      Assistance Recommended at Discharge PRN  Patient can return home with the following  Assistance with cooking/housework;Assist for transportation;Help with stairs or ramp for entrance    Equipment Recommendations None recommended by PT  Recommendations for Other Services       Functional Status Assessment Patient has had a recent decline in their functional status and demonstrates the ability to make significant improvements in function in a reasonable and predictable amount of time.     Precautions / Restrictions  Precautions Precautions: Fall Restrictions Weight Bearing Restrictions: No      Mobility  Bed Mobility Overal bed mobility: Modified Independent   Transfers Overall transfer level: Needs assistance Equipment used: None Transfers: Sit to/from Stand Sit to Stand: Supervision  General transfer comment: supv for safety    Ambulation/Gait Ambulation/Gait assistance: Min guard Gait Distance (Feet): 120 Feet Assistive device: None Gait Pattern/deviations: Step-through pattern, Decreased stride length, Wide base of support Gait velocity: decreased  General Gait Details: wide BOS, step through pattern, slightly unsteady without overt LOB or assistance needed  Stairs            Wheelchair Mobility    Modified Rankin (Stroke Patients Only)       Balance Overall balance assessment: Mild deficits observed, not formally tested       Pertinent Vitals/Pain Pain Assessment Pain Assessment: No/denies pain    Home Living Family/patient expects to be discharged to:: Private residence Living Arrangements: Children (son, sister and brother) Available Help at Discharge: Family;Available 24 hours/day Type of Home: House Home Access: Ramped entrance       Home Layout: One level Home Equipment: Cane - single point Additional Comments: home set up listed is son's    Prior Function Prior Level of Function : Independent/Modified Independent;Driving  Mobility Comments: pt reports ind with community distances, denies falls ADLs Comments: pt reports ind with self care and household chores     Hand Dominance        Extremity/Trunk Assessment   Upper Extremity Assessment Upper Extremity Assessment: Overall WFL for tasks assessed    Lower Extremity Assessment Lower Extremity Assessment: Overall WFL for tasks assessed    Cervical / Trunk Assessment Cervical /  Trunk Assessment: Normal  Communication   Communication: No difficulties (mumbles at times)  Cognition  Arousal/Alertness: Awake/alert Behavior During Therapy: WFL for tasks assessed/performed Overall Cognitive Status: Within Functional Limits for tasks assessed     General Comments      Exercises     Assessment/Plan    PT Assessment Patient needs continued PT services  PT Problem List Decreased activity tolerance;Decreased balance;Decreased knowledge of use of DME       PT Treatment Interventions DME instruction;Gait training;Functional mobility training;Therapeutic activities;Therapeutic exercise;Balance training;Patient/family education    PT Goals (Current goals can be found in the Care Plan section)  Acute Rehab PT Goals Patient Stated Goal: return home with family to support as needed PT Goal Formulation: With patient Time For Goal Achievement: 08/01/21 Potential to Achieve Goals: Good    Frequency Min 3X/week     Co-evaluation               AM-PAC PT "6 Clicks" Mobility  Outcome Measure Help needed turning from your back to your side while in a flat bed without using bedrails?: None Help needed moving from lying on your back to sitting on the side of a flat bed without using bedrails?: None Help needed moving to and from a bed to a chair (including a wheelchair)?: A Little Help needed standing up from a chair using your arms (e.g., wheelchair or bedside chair)?: A Little Help needed to walk in hospital room?: A Little Help needed climbing 3-5 steps with a railing? : A Little 6 Click Score: 20    End of Session Equipment Utilized During Treatment: Gait belt Activity Tolerance: Patient tolerated treatment well Patient left: in chair;with call bell/phone within reach Nurse Communication: Mobility status PT Visit Diagnosis: Unsteadiness on feet (R26.81);Other abnormalities of gait and mobility (R26.89)    Time: 1415-1431 PT Time Calculation (min) (ACUTE ONLY): 16 min   Charges:   PT Evaluation $PT Eval Low Complexity: 1 Low           Tori Collen Vincent  PT, DPT 07/18/21, 2:40 PM

## 2021-07-18 NOTE — Progress Notes (Signed)
[facility] Progress Note  EDIS HUISH 81 y.o. March 25, 1940  CC: Anemia, positive FOBT, abnormal CT scan   Subjective: Patient states he is doing well today.  Having bowel movements that are normal in color, though somewhat loose.  Denies melena/hematochezia.  Denies abdominal pain.  Denies nausea/vomiting  ROS : Review of Systems  Constitutional:  Negative for chills and fever.  Gastrointestinal:  Negative for abdominal pain, blood in stool, constipation, diarrhea, heartburn, melena, nausea and vomiting.      Objective: Vital signs in last 24 hours: Vitals:   07/18/21 1156 07/18/21 1159  BP: (!) 141/78 132/75  Pulse: 63 62  Resp:    Temp:    SpO2: 100% 93%    Physical Exam:  General:  Alert, cooperative, no distress, appears stated age  Head:  Normocephalic, without obvious abnormality, atraumatic  Eyes:  Anicteric sclera, EOM's intact  Lungs:   Clear to auscultation bilaterally, respirations unlabored  Heart:  Regular rate and rhythm, S1, S2 normal  Abdomen:   Soft, non-tender, bowel sounds active all four quadrants,  no masses,   Extremities: Extremities normal, atraumatic, no  edema  Pulses: 2+ and symmetric    Lab Results: Recent Labs    07/16/21 0401 07/16/21 0558 07/17/21 0448  NA 136  --  137  K 3.4*  --  3.9  CL 102  --  110  CO2 24  --  18*  GLUCOSE 111*  --  83  BUN 17  --  13  CREATININE 1.19  --  1.10  CALCIUM 9.0  --  8.9  MG  --  2.0  --   PHOS  --  3.6  --    Recent Labs    07/16/21 0401 07/17/21 0448  AST 181* 62*  ALT 95* 56*  ALKPHOS 55 45  BILITOT 0.5 1.1  PROT 7.4 6.7  ALBUMIN 3.8 3.3*   Recent Labs    07/16/21 0401 07/16/21 1112 07/17/21 0448  WBC 4.5  --  5.2  NEUTROABS 3.0  --  3.7  HGB 8.8* 7.1* 12.9*  HCT 27.1* 21.6* 38.7*  MCV 88.3  --  86.6  PLT 114*  --  163   Recent Labs    07/16/21 0401  LABPROT 14.2  INR 1.1      Assessment Anemia, positive FOBT - hgb 12.9 - no leukocytosis - iron  40, TIBC 247 - ferritin 883 - Vit b 12 277 - BUN 13, Cr. 1.10 - CT ab/pelvis w contrast: ill-defined dense enhancement between rectum and seminal vesicles and prostate. Possibly metastatic mass. S/p prostate radiation -MRI pelvis: Rectal thickening and perirectal stranding consistent with proctitis likely radiation related.  Stranding throughout retroperitoneum and throughout fascial planes of the pelvis in the setting of proctitis.  Peripheral enhancement of material in the rectal prostatic region raises possibility of superinfection of this area though findings remain nonspecific.   Plan: No further bleeding at this time.  Patient has continued radiation treatments until the end of July.  Recommend holding off on endoscopic evaluation until after radiation.  We will see him follow-up in clinic in 4 to 6 weeks.  Set up lab visit for next week to recheck CBC.  From GI standpoint patient can be discharged. [facility] will sign off. Please contact us if we can be of any further assistance during this hospital stay.   Garnette Scheuermann PA-C 07/18/2021, 12:13 PM  Contact #  [phone]

## 2021-07-19 ENCOUNTER — Other Ambulatory Visit: Payer: Self-pay

## 2021-07-19 ENCOUNTER — Ambulatory Visit
Admission: RE | Admit: 2021-07-19 | Discharge: 2021-07-19 | Disposition: A | Discharge status: Home / Self Care | Payer: Medicare HMO | Source: Ambulatory Visit | Attending: Radiation Oncology | Admitting: Radiation Oncology

## 2021-07-19 DIAGNOSIS — C61 Malignant neoplasm of prostate: Secondary | ICD-10-CM | POA: Diagnosis not present

## 2021-07-19 DIAGNOSIS — A419 Sepsis, unspecified organism: Secondary | ICD-10-CM | POA: Diagnosis not present

## 2021-07-19 DIAGNOSIS — Z510 Encounter for antineoplastic radiation therapy: Secondary | ICD-10-CM | POA: Diagnosis not present

## 2021-07-19 DIAGNOSIS — Z191 Hormone sensitive malignancy status: Secondary | ICD-10-CM | POA: Diagnosis not present

## 2021-07-19 LAB — COMPREHENSIVE METABOLIC PANEL
ALT: 38 U/L (ref 0–44)
AST: 38 U/L (ref 15–41)
Albumin: 3.1 g/dL — ABNORMAL LOW (ref 3.5–5.0)
Alkaline Phosphatase: 44 U/L (ref 38–126)
Anion gap: 7 (ref 5–15)
BUN: 18 mg/dL (ref 8–23)
CO2: 24 mmol/L (ref 22–32)
Calcium: 9.1 mg/dL (ref 8.9–10.3)
Chloride: 108 mmol/L (ref 98–111)
Creatinine, Ser: 0.96 mg/dL (ref 0.61–1.24)
GFR, Estimated: >60 mL/min (ref >60)
Glucose, Bld: 90 mg/dL (ref 70–99)
Potassium: 3.5 mmol/L (ref 3.5–5.1)
Sodium: 139 mmol/L (ref 135–145)
Total Bilirubin: 0.5 mg/dL (ref 0.3–1.2)
Total Protein: 6.6 g/dL (ref 6.5–8.1)

## 2021-07-19 LAB — CBC
HCT: 37.2 % — ABNORMAL LOW (ref 39.0–52.0)
Hemoglobin: 12.6 g/dL — ABNORMAL LOW (ref 13.0–17.0)
MCH: 28.8 pg (ref 26.0–34.0)
MCHC: 33.9 g/dL (ref 30.0–36.0)
MCV: 85.1 fL (ref 80.0–100.0)
Platelets: 170 10*3/uL (ref 150–400)
RBC: 4.37 MIL/uL (ref 4.22–5.81)
RDW: 14 % (ref 11.5–15.5)
WBC: 3.6 10*3/uL — ABNORMAL LOW (ref 4.0–10.5)
nRBC: 0 % (ref 0.0–0.2)

## 2021-07-19 LAB — GLUCOSE, CAPILLARY: Glucose-Capillary: 77 mg/dL (ref 70–99)

## 2021-07-19 LAB — RAD ONC ARIA SESSION SUMMARY
Course Elapsed Days: 21
Plan Fractions Treated to Date: 15
Plan Prescribed Dose Per Fraction: 1.8 Gy
Plan Total Fractions Prescribed: 25
Plan Total Prescribed Dose: 45 Gy
Reference Point Dosage Given to Date: 27 Gy
Reference Point Session Dosage Given: 1.8 Gy
Session Number: 15

## 2021-07-19 LAB — HEPATITIS PANEL, ACUTE
HCV Ab: NONREACTIVE
Hep A IgM: NONREACTIVE
Hep B C IgM: NONREACTIVE
Hepatitis B Surface Ag: NONREACTIVE

## 2021-07-19 LAB — CK: Total CK: 174 U/L (ref 49–397)

## 2021-07-19 MED ORDER — LISINOPRIL-HYDROCHLOROTHIAZIDE 20-12.5 MG PO TABS: 1.0000 | ORAL_TABLET | Freq: Every day | ORAL | 3 refills | Status: DC

## 2021-07-19 MED ORDER — PANTOPRAZOLE SODIUM 40 MG PO TBEC
40.0000 mg | DELAYED_RELEASE_TABLET | Freq: Two times a day (BID) | ORAL | Status: DC
  Administered 2021-07-19: 40 mg via ORAL
  Filled 2021-07-19: qty 1

## 2021-07-19 MED ORDER — METRONIDAZOLE 500 MG PO TABS: 500.0000 mg | ORAL_TABLET | Freq: Two times a day (BID) | ORAL | 0 refills | Status: AC

## 2021-07-19 MED ORDER — METRONIDAZOLE 500 MG PO TABS: 500.0000 mg | ORAL_TABLET | Freq: Two times a day (BID) | ORAL | Status: DC

## 2021-07-19 MED ORDER — CEFDINIR 300 MG PO CAPS: 300.0000 mg | ORAL_CAPSULE | Freq: Two times a day (BID) | ORAL | 0 refills | Status: AC

## 2021-07-19 MED ORDER — ASPIRIN EC 81 MG PO TBEC: 81.0000 mg | DELAYED_RELEASE_TABLET | Freq: Every day | ORAL | 0 refills | Status: DC

## 2021-07-19 MED ORDER — CEFDINIR 300 MG PO CAPS
300.0000 mg | ORAL_CAPSULE | Freq: Two times a day (BID) | ORAL | Status: DC
Start: 2021-07-19 — End: 2021-07-19
  Administered 2021-07-19: 300 mg via ORAL
  Filled 2021-07-19: qty 1

## 2021-07-19 NOTE — Progress Notes (Signed)
PHARMACIST - PHYSICIAN COMMUNICATION  CONCERNING: Antibiotic & Protonix IV to Oral Route Change    Plan: IV PPI> PO IV Flagyl > PO per P&T policy  Eudelia Bunch, Pharm.D 07/19/2021 8:20 AM

## 2021-07-19 NOTE — Progress Notes (Signed)
Physical Therapy Treatment Patient Details Name: [name] MRN: [id number] DOB: 12-18-1940 Today's Date: 07/19/2021   History of Present Illness [name] is a 80 y.o. male presents to the emergency department with syncope at home and fever. Transfused 2 units of PRBC, MRI shows extensive proctitis. Pt admitted 6/19 with sepsis due to undetermined organism. PMH: prostate cancer currently receiving radiation therapy    PT Comments    Patient ambulated x 350' without assistive device, min guard, no balance losses noted. Patient reports that he is pleased with his progress. Patience hoping to go home today.   Recommendations for follow up therapy are one component of a multi-disciplinary discharge planning process, led by the attending physician.  Recommendations may be updated based on patient status, additional functional criteria and insurance authorization.  Follow Up Recommendations  No PT follow up     Assistance Recommended at Discharge PRN  Patient can return home with the following Assistance with cooking/housework;Assist for transportation;Help with stairs or ramp for entrance   Equipment Recommendations  None recommended by PT    Recommendations for Other Services       Precautions / Restrictions Precautions Precautions: Fall     Mobility  Bed Mobility               General bed mobility comments: in recliner    Transfers Overall transfer level: Needs assistance Equipment used: None Transfers: Sit to/from Stand Sit to Stand: Supervision           General transfer comment: supv for safety    Ambulation/Gait Ambulation/Gait assistance: Min guard Gait Distance (Feet): 350 Feet Assistive device: None Gait Pattern/deviations: Step-through pattern, Decreased stride length, Wide base of support       General Gait Details: gait steady, more normal base of support. no balance loss   Physiological scientist Rankin (Stroke Patients Only)       Balance Overall balance assessment: Mild deficits observed, not formally tested                                          Cognition Arousal/Alertness: Awake/alert Behavior During Therapy: WFL for tasks assessed/performed Overall Cognitive Status: Within Functional Limits for tasks assessed                                          Exercises      General Comments        Pertinent Vitals/Pain Pain Assessment Pain Assessment: No/denies pain    Home Living                          Prior Function            PT Goals (current goals can now be found in the care plan section) Progress towards PT goals: Progressing toward goals    Frequency           PT Plan Current plan remains appropriate    Co-evaluation              AM-PAC PT "6 Clicks" Mobility   Outcome Measure  Help needed turning from your back to your side while in  a flat bed without using bedrails?: None Help needed moving from lying on your back to sitting on the side of a flat bed without using bedrails?: None Help needed moving to and from a bed to a chair (including a wheelchair)?: A Little Help needed standing up from a chair using your arms (e.g., wheelchair or bedside chair)?: A Little Help needed to walk in hospital room?: A Little Help needed climbing 3-5 steps with a railing? : A Little 6 Click Score: 20    End of Session Equipment Utilized During Treatment: Gait belt Activity Tolerance: Patient tolerated treatment well Patient left: in chair;with call bell/phone within reach Nurse Communication: Mobility status PT Visit Diagnosis: Unsteadiness on feet (R26.81);Other abnormalities of gait and mobility (R26.89)     Time: 0948-1000 PT Time Calculation (min) (ACUTE ONLY): 12 min  Charges:  $Gait Training: 8-22 mins                     [facility]  [phone] Weekend [phone]    Claretha Cooper 07/19/2021, 10:04 AM

## 2021-07-19 NOTE — Discharge Summary (Signed)
Discharge Summary  [name] XNA:[id number] DOB: 18-Jan-1941  PCP: Angelica Pou, MD  Admit date: 07/16/2021 Discharge date: 07/19/2021  Time spent: 24mns, more than 50% time spent on coordination of care.   Recommendations for Outpatient Follow-up:  F/u with PCP within a week  for hospital discharge follow up, repeat cbc/bmp at follow up. Pcp to follow up on final hepatitis result  [facility] F/u with urology F/u with radiation oncology  New meds at discharge: omnicef/flagyl for 10 days  Hold asa and lisinopril/hctz, f/u with pcp to decide on resumption. Patient states he stopped taking these meds since he started on radiation therapy Patient is advised to check blood pressure at home and bring in record for pcp to review   Discharge Diagnoses:  Active Hospital Problems   Diagnosis Date Noted   Sepsis due to undetermined organism ([facility] 07/16/2021   Mild intermittent asthma, uncomplicated 054/27/0623   Priority: Low   Hypokalemia 07/16/2021   Syncope 07/16/2021   [place] ([facility] 07/16/2021   Acute blood loss anemia 07/16/2021   Malignant neoplasm of prostate ([facility] 02/15/2021   Elevated LFTs 10/31/2020   Glaucoma 06/12/2012    Resolved Hospital Problems  No resolved problems to display.    Discharge Condition: stable  Diet recommendation: heart healthy  Filed Weights   07/17/21 0500 07/18/21 0500 07/19/21 0532  Weight: 79.5 kg 83.2 kg 87.9 kg    History of present illness:  [name] EICKHOFFis a 81y.o. male with medical history significant of BPH, prostate cancer, mild intermittent asthma, osteoarthritis, depression, HSV 1 and HSV 2 infections, history of colitis, diverticulosis, diverticulitis, LLE DVT, history of pulmonary embolism, history of neurocardiogenic syncopal episode, hypertension, hyperlipidemia who underwent radiation therapy on Friday for prostate cancer and came to the emergency department due to have a syncopal  episodes.  He has also been having epigastric and lower abdominal pain.  He has been having fevers/chills for the past 3 days associated with rhinorrhea and sneezing.   Hospital Course:  Principal Problem:   Sepsis due to undetermined organism ([facility] Active Problems:   Mild intermittent asthma, uncomplicated   Glaucoma   Elevated LFTs   Malignant neoplasm of prostate (HCC)   Hypokalemia   Syncope   Aortic atherosclerosis (HCC)   Acute blood loss anemia   Assessment and Plan:  Sepsis , present on admission -with fever 101, hypotension, tachypnea, proctitis -Blood culture no growth, urine culture no growth -Received Vanco, cefepime  and Flagyl, DC Vanco and cefepime, changed to Rocephin, continue Flagyl -sepsis resolved, discharge on Omnicef and Flagyl to cover proctitis   GI bleed from proctitis/acute blood loss anemia Status post 2 units PRBC transfusion Report GI bleed has stopped, stool is brown Seen by GI, no plan for scope during this hospitalization But will need close follow-up with GI outpatient to consider outpatient colonoscopy   Syncope in the setting of sepsis and blood loss anemia, present on admission -CT head no acute findings -CT angio chest no PE -Troponin negative -Telemetry unremarkable -resolved    LFT elevation, likely due to sepsis CT ab: Hepatobiliary: No abnormality allowing for breathing motion. The gallbladder and bile ducts are unremarkable as visualized. Does not appear to have ab pain, no n/v Negative  hepatitis panel, ck level unremarkable Lft normalized at discharge     HTN Presented with sepsis hypotension Bp stable at discharge, he reports stopped taking his bp meds since started on radiation treatment  Patient is  advised to check blood pressure at home and bring in record for pcp to review   Localized prostate cancer S/p Placement of fiducial markers into prostate and Insertion of SpaceOAR hydrogel on 5/17 by Dr Gloriann Loan [place] per urology and rad onc       Nutritional Assessment: The patient's BMI is: Body mass index is 25.58 kg/m.Marland Kitchen Seen by dietician.  I agree with the assessment and plan as outlined below: Nutrition Status: Nutrition Problem: Increased nutrient needs Etiology: acute illness, cancer and cancer related treatments Signs/Symptoms: estimated needs Interventions: Ensure Enlive (each supplement provides 350kcal and 20 grams of protein), MVI   .   Discharge Exam: BP (!) 142/78 (BP Location: Right Arm)   Pulse 61   Temp 98.5 F (36.9 C) (Oral)   Resp 18   Ht '5\' 11"'$  (1.803 m)   Wt 87.9 kg   SpO2 100%   BMI 27.03 kg/m   General: NAD Cardiovascular: RRR Respiratory: Normal respiratory effort    Discharge Instructions     Diet - low sodium heart healthy   Complete by: As directed    Increase activity slowly   Complete by: As directed       Allergies as of 07/19/2021   No Known Allergies      Medication List     STOP taking these medications    cephALEXin 500 MG capsule Commonly known as: KEFLEX   colchicine 0.6 MG tablet   ondansetron 4 MG disintegrating tablet Commonly known as: Zofran ODT       TAKE these medications    albuterol 108 (90 Base) MCG/ACT inhaler Commonly known as: Proventil HFA Inhale 1-2 puffs into the lungs every 6 (six) hours as needed for wheezing or shortness of breath.   aspirin EC 81 MG tablet Take 1 tablet (81 mg total) by mouth daily. Resume when ok with your pcp What changed: additional instructions   cefdinir 300 MG capsule Commonly known as: OMNICEF Take 1 capsule (300 mg total) by mouth every 12 (twelve) hours for 10 days.   latanoprost 0.005 % ophthalmic solution Commonly known as: XALATAN 1 drop at bedtime.   lisinopril-hydrochlorothiazide 20-12.5 MG tablet Commonly known as: ZESTORETIC Take 1 tablet by mouth daily. Please follow up with your pcp regarding when to resume this medication Currently marked as not  taking What changed: additional instructions   metroNIDAZOLE 500 MG tablet Commonly known as: FLAGYL Take 1 tablet (500 mg total) by mouth every 12 (twelve) hours for 10 days.               Durable Medical Equipment  (From admission, onward)           Start     Ordered   07/19/21 1228  For home use only DME Walker rolling  Once       Question Answer Comment  Walker: With [place] Wheels   Patient needs a walker to treat with the following condition Gait instability      07/19/21 1227           No Known Allergies  Follow-up Information     Angelica Pou, MD Follow up in 1 week(s).   Specialty: Internal Medicine Why: hospital discharge follow up, repeat cbc/bmp at follow up Contact information: [address] [address] [phone]         Arta Silence, MD Follow up in 1 month(s).   Specialty: Gastroenterology Why: for Blood in stool,  GI  to arrange outpatient colonoscopy Contact information: [address] [place] [address] [phone]         Lucas Mallow, MD Follow up.   Specialty: Urology Contact information: [place] [address] [phone]                  The results of significant diagnostics from this hospitalization (including imaging, microbiology, ancillary and laboratory) are listed below for reference.    Significant Diagnostic Studies: MR PELVIS W WO CONTRAST  Result Date: 07/16/2021 CLINICAL DATA:  An 81 year old male presents for evaluation of metastatic disease, reportedly with history of "SpaceOAR" placement. EXAM: MRI PELVIS WITHOUT AND WITH CONTRAST TECHNIQUE: Multiplanar multisequence MR imaging of the pelvis was performed both before and after administration of intravenous contrast. CONTRAST:  85m GADAVIST GADOBUTROL 1 MMOL/ML IV SOLN COMPARISON:  CT of the abdomen and pelvis which was performed on July 16, 2021 as well. FINDINGS: Urinary Tract:  Urinary bladder with smooth contours. No signs of distal ureteral dilation. Bowel: Marked rectal thickening and perirectal stranding. In the rectoprostatic region there is evidence of open "SpaceOAR" material separating the prostate from the rectum. This shows some peripheral enhancement and measures 4.6 by 1.9 cm greatest axial dimension. There is edema throughout the retroperitoneum and edema throughout fascial planes of the pelvis. Vascular/Lymphatic: No signs of adenopathy about the pelvis. Vascular structures not well assessed due to protocol. Vascular structures are of normal caliber. Reproductive: Prostate which is heterogeneous, nonspecific in this patient with known prostate cancer post radiation. Enhancement of much of the gland is demonstrated with lobular appearance presumably diffuse prostate cancer. Other: Stranding throughout the retroperitoneum and throughout fascial planes of the pelvis in the setting of proctitis. Musculoskeletal: No bony lesions of the pelvis. IMPRESSION: 1. Evidence of SpaceOAR material separating the prostate from the rectum. No definite signs of metastatic disease to the pelvis at this time 2. Marked rectal thickening and perirectal stranding, findings compatible with proctitis likely radiation related. Correlate with any signs of infection. 3. Stranding throughout the retroperitoneum and throughout fascial planes of the pelvis in the setting of proctitis. Peripheral enhancement of material in the rectoprostatic region raises the possibility of superinfection of this area though findings remain nonspecific in the setting of radiotherapy. Consider close follow-up given degree of inflammation that tracks throughout the pelvis which is unchanged compared to the CT that was acquired of the same date. 4. No signs of adenopathy about the pelvis with changes of diffuse prostate cancer within the prostate gland. Electronically Signed   By: GZetta BillsM.D.   On: 07/16/2021 17:41    ECHOCARDIOGRAM COMPLETE  Result Date: 07/16/2021    ECHOCARDIOGRAM REPORT   Patient Name:   LSHANTE MAYSONETDate of Exam: 07/16/2021 Medical Rec #:  [id number]      Height:       71.0 in Accession #:    [id number]     Weight:       181.0 lb Date of Birth:  [birth year]      BSA:          2.021 m Patient Age:    89years        BP:           159/101 mmHg Patient Gender: M               HR:           72 bpm. Exam  Location:  Inpatient Procedure: 2D Echo, Cardiac Doppler and Color Doppler Indications:    Syncope  History:        Patient has prior history of Echocardiogram examinations, most                 recent 11/07/2015. Risk Factors:Hypertension, HLD and Former                 Smoker.  Sonographer:    Joette Catching RCS Referring Phys: [phone] [place]  Sonographer Comments: Technically challenging study due to limited acoustic windows, no parasternal window and no apical window. Image acquisition challenging due to patient body habitus. Study done from subcostal window IMPRESSIONS  1. Left ventricular ejection fraction, by estimation, is 55 to 60%. The left ventricle has normal function. The left ventricle has no regional wall motion abnormalities. Left ventricular diastolic parameters are indeterminate.  2. Right ventricular systolic function is normal. The right ventricular size is normal.  3. The mitral valve is normal in structure. Trivial mitral valve regurgitation. No evidence of mitral stenosis.  4. The aortic valve is tricuspid. There is mild calcification of the aortic valve. Aortic valve regurgitation is not visualized. No aortic stenosis is present.  5. The inferior vena cava is normal in size with greater than 50% respiratory variability, suggesting right atrial pressure of 3 mmHg.  6. Only subcostal windows available for interpretation. FINDINGS  Left Ventricle: Left ventricular ejection fraction, by estimation, is 55 to 60%. The left ventricle has normal function. The left ventricle  has no regional wall motion abnormalities. The left ventricular internal cavity size was normal in size. There is  no left ventricular hypertrophy. Left ventricular diastolic parameters are indeterminate. Right Ventricle: The right ventricular size is normal. No increase in right ventricular wall thickness. Right ventricular systolic function is normal. Left Atrium: Left atrial size was normal in size. Right Atrium: Right atrial size was normal in size. Pericardium: There is no evidence of pericardial effusion. Mitral Valve: The mitral valve is normal in structure. Trivial mitral valve regurgitation. No evidence of mitral valve stenosis. Tricuspid Valve: The tricuspid valve is normal in structure. Tricuspid valve regurgitation is trivial. No evidence of tricuspid stenosis. Aortic Valve: The aortic valve is tricuspid. There is mild calcification of the aortic valve. Aortic valve regurgitation is not visualized. No aortic stenosis is present. Aortic valve mean gradient measures 4.0 mmHg. Aortic valve peak gradient measures 6.9 mmHg. Aortic valve area, by VTI measures 2.05 cm. Pulmonic Valve: The pulmonic valve was normal in structure. Pulmonic valve regurgitation is not visualized. No evidence of pulmonic stenosis. Aorta: The aortic root is normal in size and structure. Venous: The inferior vena cava is normal in size with greater than 50% respiratory variability, suggesting right atrial pressure of 3 mmHg. IAS/Shunts: No atrial level shunt detected by color flow Doppler.  LEFT VENTRICLE PLAX 2D LVIDd:         4.20 cm LVIDs:         2.90 cm LV PW:         0.70 cm LV IVS:        0.90 cm LVOT diam:     2.10 cm LV SV:         52 LV SV Index:   26 LVOT Area:     3.46 cm  IVC IVC diam: 1.70 cm LEFT ATRIUM         Index LA diam:    2.80 cm 1.39 cm/m  AORTIC  VALVE                    PULMONIC VALVE AV Area (Vmax):    2.38 cm     PV Vmax:       0.84 m/s AV Area (Vmean):   2.06 cm     PV Peak grad:  2.8 mmHg AV Area (VTI):      2.05 cm AV Vmax:           131.00 cm/s AV Vmean:          96.900 cm/s AV VTI:            0.255 m AV Peak Grad:      6.9 mmHg AV Mean Grad:      4.0 mmHg LVOT Vmax:         90.10 cm/s LVOT Vmean:        57.600 cm/s LVOT VTI:          0.151 m LVOT/AV VTI ratio: 0.59  AORTA Ao Root diam: 3.70 cm MITRAL VALVE               TRICUSPID VALVE MV Area (PHT): 4.49 cm    TR Peak grad:   27.7 mmHg MV Decel Time: 169 msec    TR Vmax:        263.00 cm/s MV E velocity: 53.10 cm/s MV A velocity: 60.00 cm/s  SHUNTS MV E/A ratio:  0.89        Systemic VTI:  0.15 m                            Systemic Diam: 2.10 cm Glori Bickers MD Electronically signed by Glori Bickers MD Signature Date/Time: 07/16/2021/4:27:20 PM    Final    CT Angio Chest PE W and/or Wo Contrast  Result Date: 07/16/2021 CLINICAL DATA:  Pulmonary embolism suspected. High probability. Nonlocalized abdominal pain with occasional right upper lobe distribution. Fever and syncope. Possible sepsis. EXAM: CT ANGIOGRAPHY CHEST CT ABDOMEN AND PELVIS WITH CONTRAST TECHNIQUE: Multidetector CT imaging of the chest was performed using the standard protocol during bolus administration of intravenous contrast. Multiplanar CT image reconstructions and MIPs were obtained to evaluate the vascular anatomy. Multidetector CT imaging of the abdomen and pelvis was performed using the standard protocol during bolus administration of intravenous contrast. RADIATION DOSE REDUCTION: This exam was performed according to the departmental dose-optimization program which includes automated exposure control, adjustment of the mA and/or kV according to patient size and/or use of iterative reconstruction technique. CONTRAST:  147m OMNIPAQUE IOHEXOL 350 MG/ML SOLN COMPARISON:  CTA chest 10/23/2013, portable chest today portable chest 10/25/2020, CT abdomen and pelvis with contrast 10/25/2020, and CT abdomen and pelvis with contrast 04/08/2012. FINDINGS: CTA CHEST FINDINGS  Cardiovascular: There stable proximal thoracic aortic ectasia up to 3.8 cm, scattered aortic calcific plaques. There is no dissection or aneurysm no penetrating ulcer. The great vessels opacify well with normal variant brachiobocarotid trunk. The cardiac size is normal. There is no pericardial effusion or visible coronary artery calcification. The pulmonary arteries are normal in caliber and clear at least to the segmental level but the subsegmental arterial bed is obscured by breathing motion. Pulmonary veins are decompressed. Mediastinum/Nodes: Mild chronic elevation right hemidiaphragm. There is no thyroid or axillary mass. There is again noted prominent AP window lymph node measuring 2.7 by 1.1 cm not significantly changed. There is no further intrathoracic adenopathy. The trachea and esophagus are  unremarkable. Lungs/Pleura: There is minimal paraseptal emphysematous change in the right apex. No pulmonary consolidation mass or effusion seen. Evaluation of the lungs technically limited due to respiratory motion. Musculoskeletal: There are degenerative changes of the thoracic spine multilevel degenerative discs and Schmorl's nodes and mild chronic anterior wedging of the T7 vertebral body. There are facet spurs and multilevel thoracic foraminal stenosis. There is no concerning regional bone lesion. No chest wall lesion is seen. Review of the MIP images confirms the above findings. CT ABDOMEN and PELVIS FINDINGS Comment: Abundant breathing motion limits this study as well. Hepatobiliary: No abnormality allowing for breathing motion. The gallbladder and bile ducts are unremarkable as visualized. Pancreas: No focal abnormality is seen through the breathing motion. Spleen: Unremarkable. Adrenals/Urinary Tract: There are bilateral renal cysts, largest is septated versus 2 abutting cysts in aggregate measuring 6.5 cm left lower pole. Stable right renal cyst measuring 3.6 cm. There is no solid renal mass enhancement.  There is no adrenal mass. There is no urinary stone or obstruction. Diffusely thickened bladder seen with perivesical stranding. Stomach/Bowel: The stomach and unopacified small bowel are unremarkable. There is fluid in the colon. The appendix is normal. There are left-sided uncomplicated diverticula. Rectum is diffusely circumferentially thickened with surrounding stranding. Vascular/Lymphatic: Aortic atherosclerosis. No definitive adenopathy. Reproductive: Mild prostatomegaly. Fiducial markers have been placed in the prostate since the prior study. There is stranding around the prostate continuing from the area around the bladder. Both of the testicles are retracted up into the inguinal canals. Other: On series 3 axial 81-88 there is an ill-defined area of enhancement in between the rectum and the seminal vesicles and prostate measuring 4.1 x 1.6 by 4.4 cm extends down to the pelvic floor but not below the urogenital diaphragm. Some images suggest this could be serpiginous vascular structure such as localized pelvic venous congestion but could also be a small contrast extravasation into pelvic floor from pelvic floor blood vessels or potentially from the posterior prostate. Another possibility would be an enhancing metastatic mass but a pelvic metastasis should not be this densely enhancing. In addition to diffuse stranding in the pelvis from the level of the bladder dome to floor there is mild ascites along the pelvic sidewalls tracking to the distal paracolic gutters and into the deep pelvic recesses. There is no free air and no abscess is seen. There are small inguinal fat hernias. Musculoskeletal: There are degenerative changes of the lumbar spine there is partial ankylosis across the L4-5 interspace ankylosis over the facet joints at this level. No worrisome regional bone lesion is seen. Ankylosis both SI joints. Review of the MIP images confirms the above findings. IMPRESSION: 1. The patient has most likely  had radiation therapy to the prostate and surrounding tissues. There is diffuse stranding in the pelvis from the bladder dome inferiorly, bladder thickening which is probably radiation related versus cystitis, diffusely thickened rectum probably also XRT related or could be infectious proctitis. 2. There is ill-defined dense enhancement in between the rectum, seminal vesicles and prostate over an area of 4.1 x 1.6 x 4.4 cm, uncertain etiology not present on the prior scans. Some images suggest this could be some sort of pelvic venous congestive phenomenon. Also possible this could be pelvic contrast extravasation from the pelvic floor or prostate blood vessels, but it does not have a fluid appearance per se, and his prostate biopsy was several months ago therefore this would be unlikely. Other possibility would be an ill-defined metastatic mass. MRI without and  with contrast may be helpful. 3. Mild pelvic ascites. 4. No pulmonary arterial embolus at least through the segmental arteries. The subsegmental arterial bed largely obscured due to breathing motion. 5. Aortic atherosclerosis with stable proximal aortic ectasia. 6. Both testicles retracted into the inguinal canals. 7. Remaining findings described above. Electronically Signed   By: Telford Nab M.D.   On: 07/16/2021 06:37   CT ABDOMEN PELVIS W CONTRAST  Result Date: 07/16/2021 CLINICAL DATA:  Pulmonary embolism suspected. High probability. Nonlocalized abdominal pain with occasional right upper lobe distribution. Fever and syncope. Possible sepsis. EXAM: CT ANGIOGRAPHY CHEST CT ABDOMEN AND PELVIS WITH CONTRAST TECHNIQUE: Multidetector CT imaging of the chest was performed using the standard protocol during bolus administration of intravenous contrast. Multiplanar CT image reconstructions and MIPs were obtained to evaluate the vascular anatomy. Multidetector CT imaging of the abdomen and pelvis was performed using the standard protocol during bolus  administration of intravenous contrast. RADIATION DOSE REDUCTION: This exam was performed according to the departmental dose-optimization program which includes automated exposure control, adjustment of the mA and/or kV according to patient size and/or use of iterative reconstruction technique. CONTRAST:  197m OMNIPAQUE IOHEXOL 350 MG/ML SOLN COMPARISON:  CTA chest 10/23/2013, portable chest today portable chest 10/25/2020, CT abdomen and pelvis with contrast 10/25/2020, and CT abdomen and pelvis with contrast 04/08/2012. FINDINGS: CTA CHEST FINDINGS Cardiovascular: There stable proximal thoracic aortic ectasia up to 3.8 cm, scattered aortic calcific plaques. There is no dissection or aneurysm no penetrating ulcer. The great vessels opacify well with normal variant brachiobocarotid trunk. The cardiac size is normal. There is no pericardial effusion or visible coronary artery calcification. The pulmonary arteries are normal in caliber and clear at least to the segmental level but the subsegmental arterial bed is obscured by breathing motion. Pulmonary veins are decompressed. Mediastinum/Nodes: Mild chronic elevation right hemidiaphragm. There is no thyroid or axillary mass. There is again noted prominent AP window lymph node measuring 2.7 by 1.1 cm not significantly changed. There is no further intrathoracic adenopathy. The trachea and esophagus are unremarkable. Lungs/Pleura: There is minimal paraseptal emphysematous change in the right apex. No pulmonary consolidation mass or effusion seen. Evaluation of the lungs technically limited due to respiratory motion. Musculoskeletal: There are degenerative changes of the thoracic spine multilevel degenerative discs and Schmorl's nodes and mild chronic anterior wedging of the T7 vertebral body. There are facet spurs and multilevel thoracic foraminal stenosis. There is no concerning regional bone lesion. No chest wall lesion is seen. Review of the MIP images confirms the  above findings. CT ABDOMEN and PELVIS FINDINGS Comment: Abundant breathing motion limits this study as well. Hepatobiliary: No abnormality allowing for breathing motion. The gallbladder and bile ducts are unremarkable as visualized. Pancreas: No focal abnormality is seen through the breathing motion. Spleen: Unremarkable. Adrenals/Urinary Tract: There are bilateral renal cysts, largest is septated versus 2 abutting cysts in aggregate measuring 6.5 cm left lower pole. Stable right renal cyst measuring 3.6 cm. There is no solid renal mass enhancement. There is no adrenal mass. There is no urinary stone or obstruction. Diffusely thickened bladder seen with perivesical stranding. Stomach/Bowel: The stomach and unopacified small bowel are unremarkable. There is fluid in the colon. The appendix is normal. There are left-sided uncomplicated diverticula. Rectum is diffusely circumferentially thickened with surrounding stranding. Vascular/Lymphatic: Aortic atherosclerosis. No definitive adenopathy. Reproductive: Mild prostatomegaly. Fiducial markers have been placed in the prostate since the prior study. There is stranding around the prostate continuing from the area around the  bladder. Both of the testicles are retracted up into the inguinal canals. Other: On series 3 axial 81-88 there is an ill-defined area of enhancement in between the rectum and the seminal vesicles and prostate measuring 4.1 x 1.6 by 4.4 cm extends down to the pelvic floor but not below the urogenital diaphragm. Some images suggest this could be serpiginous vascular structure such as localized pelvic venous congestion but could also be a small contrast extravasation into pelvic floor from pelvic floor blood vessels or potentially from the posterior prostate. Another possibility would be an enhancing metastatic mass but a pelvic metastasis should not be this densely enhancing. In addition to diffuse stranding in the pelvis from the level of the bladder  dome to floor there is mild ascites along the pelvic sidewalls tracking to the distal paracolic gutters and into the deep pelvic recesses. There is no free air and no abscess is seen. There are small inguinal fat hernias. Musculoskeletal: There are degenerative changes of the lumbar spine there is partial ankylosis across the L4-5 interspace ankylosis over the facet joints at this level. No worrisome regional bone lesion is seen. Ankylosis both SI joints. Review of the MIP images confirms the above findings. IMPRESSION: 1. The patient has most likely had radiation therapy to the prostate and surrounding tissues. There is diffuse stranding in the pelvis from the bladder dome inferiorly, bladder thickening which is probably radiation related versus cystitis, diffusely thickened rectum probably also XRT related or could be infectious proctitis. 2. There is ill-defined dense enhancement in between the rectum, seminal vesicles and prostate over an area of 4.1 x 1.6 x 4.4 cm, uncertain etiology not present on the prior scans. Some images suggest this could be some sort of pelvic venous congestive phenomenon. Also possible this could be pelvic contrast extravasation from the pelvic floor or prostate blood vessels, but it does not have a fluid appearance per se, and his prostate biopsy was several months ago therefore this would be unlikely. Other possibility would be an ill-defined metastatic mass. MRI without and with contrast may be helpful. 3. Mild pelvic ascites. 4. No pulmonary arterial embolus at least through the segmental arteries. The subsegmental arterial bed largely obscured due to breathing motion. 5. Aortic atherosclerosis with stable proximal aortic ectasia. 6. Both testicles retracted into the inguinal canals. 7. Remaining findings described above. Electronically Signed   By: Telford Nab M.D.   On: 07/16/2021 06:37   CT Head Wo Contrast  Result Date: 07/16/2021 CLINICAL DATA:  Moderate to severe head  trauma. EXAM: CT HEAD WITHOUT CONTRAST TECHNIQUE: Contiguous axial images were obtained from the base of the skull through the vertex without intravenous contrast. RADIATION DOSE REDUCTION: This exam was performed according to the departmental dose-optimization program which includes automated exposure control, adjustment of the mA and/or kV according to patient size and/or use of iterative reconstruction technique. COMPARISON:  Head CT 02/10/2010 FINDINGS: Brain: There is interval progression of mild cerebral atrophy, small-vessel disease and atrophic ventriculomegaly compared to the prior study. There is slight cerebellar atrophy also increased. No asymmetry is seen concerning for an acute infarct, hemorrhage or mass. There is no midline shift. Basal cisterns are clear. Vascular: No hyperdense vessel or unexpected calcification. Skull: There is no visible scalp hematoma, no depressed skull fracture or focal skull lesion is seen. Sinuses/Orbits: There is mild membrane thickening in the right maxillary and bilateral ethmoid sinus air cells without fluid level or other significant visualized sinus disease. Other: There is no mastoid  effusion. Mastoid pneumatization continues into both petrous apices and the posterior squamous temporal bones. IMPRESSION: No acute intracranial CT findings or depressed skull fractures. Progressive mild atrophy and small-vessel disease since 02/10/2010. Electronically Signed   By: Telford Nab M.D.   On: 07/16/2021 05:42   DG Chest Port 1 View  Result Date: 07/16/2021 CLINICAL DATA:  Questionable sepsis.  History of prostate cancer. EXAM: PORTABLE CHEST 1 VIEW COMPARISON:  Portable chest 10/25/2020. FINDINGS: The cardiac size is normal. No vascular congestion is seen. There is mild ectasia and tortuosity of the aorta with a stable mediastinum. The lungs are clear. No pleural effusion is seen or evidence of pneumothorax. Thoracic spondylosis. IMPRESSION: No evidence of acute chest  disease or interval changes. Electronically Signed   By: Telford Nab M.D.   On: 07/16/2021 05:35    Microbiology: Recent Results (from the past 240 hour(s))  Blood Culture (routine x 2)     Status: None (Preliminary result)   Collection Time: 07/16/21  3:54 AM   Specimen: BLOOD  Result Value Ref Range Status   Specimen Description   Final    BLOOD RIGHT ANTECUBITAL Performed at [facility] [address], [place], [place] [address]    Special Requests   Final    BOTTLES DRAWN AEROBIC AND ANAEROBIC Blood Culture adequate volume Performed at [facility] [address], [place], [place] [address]    Culture   Final    NO GROWTH 3 DAYS Performed at [facility], [place] [address], [place], [place] [address]    Report Status PENDING  Incomplete  Resp Panel by RT-PCR (Flu A&B, Covid) Anterior Nasal Swab     Status: None   Collection Time: 07/16/21  4:05 AM   Specimen: Anterior Nasal Swab  Result Value Ref Range Status   SARS Coronavirus 2 by RT PCR NEGATIVE NEGATIVE Final    Comment: (NOTE) SARS-CoV-2 target nucleic acids are NOT DETECTED.  The SARS-CoV-2 RNA is generally detectable in upper respiratory specimens during the acute phase of infection. The lowest concentration of SARS-CoV-2 viral copies this assay can detect is 138 copies/mL. A negative result does not preclude SARS-Cov-2 infection and should not be used as the sole basis for treatment or other patient management decisions. A negative result may occur with  improper specimen collection/handling, submission of specimen other than nasopharyngeal swab, presence of viral mutation(s) within the areas targeted by this assay, and inadequate number of viral copies(<138 copies/mL). A negative result must be combined with clinical observations, patient history, and epidemiological information. The expected result is Negative.  Fact Sheet for Patients:   EntrepreneurPulse.com.au  Fact Sheet for Healthcare Providers:  IncredibleEmployment.be  This test is no t yet approved or cleared by the [place] FDA and  has been authorized for detection and/or diagnosis of SARS-CoV-2 by FDA under an Emergency Use Authorization (EUA). This EUA will remain  in effect (meaning this test can be used) for the duration of the COVID-19 declaration under Section 564(b)(1) of the Act, 21 U.S.C.section 360bbb-3(b)(1), unless the authorization is terminated  or revoked sooner.       Influenza A by PCR NEGATIVE NEGATIVE Final   Influenza B by PCR NEGATIVE NEGATIVE Final    Comment: (NOTE) The Xpert Xpress SARS-CoV-2/FLU/RSV plus assay is intended as an aid in the diagnosis of influenza from Nasopharyngeal swab specimens and should not be used as a sole basis for treatment. Nasal washings and aspirates are unacceptable for  Xpert Xpress SARS-CoV-2/FLU/RSV testing.  Fact Sheet for Patients: EntrepreneurPulse.com.au  Fact Sheet for Healthcare Providers: IncredibleEmployment.be  This test is not yet approved or cleared by the [place] FDA and has been authorized for detection and/or diagnosis of SARS-CoV-2 by FDA under an Emergency Use Authorization (EUA). This EUA will remain in effect (meaning this test can be used) for the duration of the COVID-19 declaration under Section 564(b)(1) of the Act, 21 U.S.C. section 360bbb-3(b)(1), unless the authorization is terminated or revoked.  Performed at [facility], [place] [address], [place], [place] [address]   Blood Culture (routine x 2)     Status: None (Preliminary result)   Collection Time: 07/16/21  5:58 AM   Specimen: BLOOD  Result Value Ref Range Status   Specimen Description BLOOD LEFT ANTECUBITAL  Final   Special Requests   Final    BOTTLES DRAWN AEROBIC ONLY Blood Culture adequate volume   Culture    Final    NO GROWTH 3 DAYS Performed at [facility], [place] [address] [place], [place] [address]    Report Status PENDING  Incomplete  Urine Culture     Status: None   Collection Time: 07/16/21  1:55 PM   Specimen: In/Out Cath Urine  Result Value Ref Range Status   Specimen Description   Final    IN/OUT CATH URINE Performed at [facility] [address] [place], [place] [address]    Special Requests   Final    NONE Performed at [facility], [place] [address], [place], [place] [address]    Culture   Final    NO GROWTH Performed at [facility], [place] [address], [place],  [address]    Report Status 07/17/2021 FINAL  Final     Labs: Basic Metabolic Panel: Recent Labs  Lab 07/16/21 0401 07/16/21 0558 07/17/21 0448 07/19/21 0731  NA 136  --  137 139  K 3.4*  --  3.9 3.5  CL 102  --  110 108  CO2 24  --  18* 24  GLUCOSE 111*  --  83 90  BUN 17  --  13 18  CREATININE 1.19  --  1.10 0.96  CALCIUM 9.0  --  8.9 9.1  MG  --  2.0  --   --   PHOS  --  3.6  --   --    Liver Function Tests: Recent Labs  Lab 07/16/21 0401 07/17/21 0448 07/19/21 0731  AST 181* 62* 38  ALT 95* 56* 38  ALKPHOS 55 45 44  BILITOT 0.5 1.1 0.5  PROT 7.4 6.7 6.6  ALBUMIN 3.8 3.3* 3.1*   No results for input(s): "LIPASE", "AMYLASE" in the last 168 hours. No results for input(s): "AMMONIA" in the last 168 hours. CBC: Recent Labs  Lab 07/16/21 0401 07/16/21 1112 07/17/21 0448 07/19/21 0731  WBC 4.5  --  5.2 3.6*  NEUTROABS 3.0  --  3.7  --   HGB 8.8* 7.1* 12.9* 12.6*  HCT 27.1* 21.6* 38.7* 37.2*  MCV 88.3  --  86.6 85.1  PLT 114*  --  163 170   Cardiac Enzymes: Recent Labs  Lab 07/19/21 0731  CKTOTAL 174   BNP: BNP (last 3 results) No results for input(s): "BNP" in the last 8760 hours.  ProBNP (last 3 results) No results for input(s): "PROBNP" in the last 8760 hours.  CBG: Recent Labs  Lab 07/17/21 0904  07/17/21 0945 07/17/21  1140 07/18/21 0514 07/19/21 0527  GLUCAP 80 75 82 99 77    FURTHER DISCHARGE INSTRUCTIONS:   Get Medicines reviewed and adjusted: Please take all your medications with you for your next visit with your Primary MD   Laboratory/radiological data: Please request your Primary MD to go over all hospital tests and procedure/radiological results at the follow up, please ask your Primary MD to get all Hospital records sent to his/her office.   In some cases, they will be blood work, cultures and biopsy results pending at the time of your discharge. Please request that your primary care M.D. goes through all the records of your hospital data and follows up on these results.   Also Note the following: If you experience worsening of your admission symptoms, develop shortness of breath, life threatening emergency, suicidal or homicidal thoughts you must seek medical attention immediately by calling 911 or calling your MD immediately  if symptoms less severe.   You must read complete instructions/literature along with all the possible adverse reactions/side effects for all the Medicines you take and that have been prescribed to you. Take any new Medicines after you have completely understood and accpet all the possible adverse reactions/side effects.    Do not drive when taking Pain medications or sleeping medications (Benzodaizepines)   Do not take more than prescribed Pain, Sleep and Anxiety Medications. It is not advisable to combine anxiety,sleep and pain medications without talking with your primary care practitioner   Special Instructions: If you have smoked or chewed Tobacco  in the last 2 yrs please stop smoking, stop any regular Alcohol  and or any Recreational drug use.   Wear Seat belts while driving.   Please note: You were cared for by a hospitalist during your hospital stay. Once you are discharged, your primary care physician will handle any further medical  issues. Please note that NO REFILLS for any discharge medications will be authorized once you are discharged, as it is imperative that you return to your primary care physician (or establish a relationship with a primary care physician if you do not have one) for your post hospital discharge needs so that they can reassess your need for medications and monitor your lab values.     Signed:  Florencia Reasons MD, PhD, FACP  [facility] 07/19/2021, 4:13 PM

## 2021-07-20 ENCOUNTER — Ambulatory Visit: Admission: RE | Admit: 2021-07-20 | Payer: Medicare HMO | Source: Ambulatory Visit

## 2021-07-20 ENCOUNTER — Other Ambulatory Visit: Payer: Self-pay

## 2021-07-20 ENCOUNTER — Ambulatory Visit: Payer: Medicare HMO

## 2021-07-21 LAB — CULTURE, BLOOD (ROUTINE X 2)
Culture: NO GROWTH
Culture: NO GROWTH
Special Requests: ADEQUATE
Special Requests: ADEQUATE

## 2021-07-23 ENCOUNTER — Ambulatory Visit: Payer: Medicare HMO

## 2021-07-24 ENCOUNTER — Ambulatory Visit: Payer: Medicare HMO

## 2021-07-25 ENCOUNTER — Ambulatory Visit: Payer: Medicare HMO

## 2021-07-26 ENCOUNTER — Ambulatory Visit: Payer: Medicare HMO

## 2021-07-26 ENCOUNTER — Ambulatory Visit (INDEPENDENT_AMBULATORY_CARE_PROVIDER_SITE_OTHER): Payer: Medicare HMO | Admitting: Internal Medicine

## 2021-07-26 VITALS — BP 127/69 | HR 66 | Temp 98.0°F | Wt 181.4 lb

## 2021-07-26 DIAGNOSIS — R7989 Other specified abnormal findings of blood chemistry: Secondary | ICD-10-CM | POA: Diagnosis not present

## 2021-07-26 DIAGNOSIS — R55 Syncope and collapse: Secondary | ICD-10-CM | POA: Diagnosis not present

## 2021-07-26 DIAGNOSIS — K627 Radiation proctitis: Secondary | ICD-10-CM | POA: Diagnosis not present

## 2021-07-26 DIAGNOSIS — I10 Essential (primary) hypertension: Secondary | ICD-10-CM | POA: Diagnosis not present

## 2021-07-26 DIAGNOSIS — D62 Acute posthemorrhagic anemia: Secondary | ICD-10-CM | POA: Diagnosis not present

## 2021-07-26 NOTE — Progress Notes (Signed)
CC: Hospital follow up  HPI:  [name] is a 81 y.o. with medical history as below presenting to [facility] for hospital follow up.   Please see problem-based list for further details, assessments, and plans.  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    History of colitis 2005   per colonoscopy chronic inflammation colits secondary to ascending colon ulceration   History of diverticulitis of colon    History of DVT of lower extremity 09/13/2006   LLE   History of pulmonary embolism 09/13/2006   bilateral PE secondary to DVT of LLE in 2008 On Warfarin until 01/2010 when he was admitted for syncope and noticed that he had completed course of warfarin and it was stopped.   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  Always with standing or micturation.  Had bradycardic/hypotensive response with standing when hospitalized in 1/12.  On Florinef.  Echo (1/12): EF 60-65%, mild aortic root dilation.    HTN (hypertension)    followed by pcp   (05-12-2023no medication since 01/ 2023 by pt's pcp)   Hyperlipidemia    Malignant neoplasm prostate ([place]) 01/2021   urologist-- dr bell;  dx 01/ 2023, Gleason 4+4   Mild intermittent asthma    PFT's 05/2012 : FEV1 / FVC 75 and 78 pre and post BD. FEV1 74 and 75 pre and post BD. TLC 72. RV 93. DLCO 79. Read as restrictive lung disease, possible with obstructive lung disease. Prior smoker.     OA (osteoarthritis)    knees   Wears glasses      Current Outpatient Medications (Cardiovascular):    lisinopril-hydrochlorothiazide (ZESTORETIC) 20-12.5 MG tablet, Take 1 tablet by mouth daily. Please follow up with your pcp regarding when to resume this medication Currently marked as not taking  Current Outpatient  Medications (Respiratory):    albuterol (PROVENTIL HFA) 108 (90 Base) MCG/ACT inhaler, Inhale 1-2 puffs into the lungs every 6 (six) hours as needed for wheezing or shortness of breath. (Patient not taking: Reported on 06/08/2021)  Current Outpatient Medications (Analgesics):    aspirin EC 81 MG tablet, Take 1 tablet (81 mg total) by mouth daily. Resume when ok with your pcp   Current Outpatient Medications (Other):    cefdinir (OMNICEF) 300 MG capsule, Take 1 capsule (300 mg total) by mouth every 12 (twelve) hours for 10 days.   latanoprost (XALATAN) 0.005 % ophthalmic solution, 1 drop at bedtime. (Patient not taking: Reported on 03/13/2021)   metroNIDAZOLE (FLAGYL) 500 MG tablet, Take 1 tablet (500 mg total) by mouth every 12 (twelve) hours for 10 days.  Review of Systems:  Review of system negative unless stated in the problem list or HPI.    Physical Exam:  Vitals:   07/26/21 1412  BP: 127/69  Pulse: 66  Temp: 98 F (36.7 C)  TempSrc: Oral  SpO2: 100%  Weight: 181 lb 6.4 oz (82.3 kg)    Physical Exam General: NAD HENT: NCAT Lungs: CTAB, no wheeze, rhonchi or rales.  Cardiovascular: Normal heart sounds, no r/m/g, 2+ pulses in all extremities. No LE edema Abdomen: No TTP, normal bowel sounds MSK: No asymmetry or muscle atrophy.  Skin: no lesions noted on exposed skin  Neuro: Alert and oriented x4. CN grossly intact Psych: Normal mood and normal affect   Assessment & Plan:   See Encounters Tab for problem based charting.  Patient discussed with Dr. Edrick Kins, MD

## 2021-07-26 NOTE — Patient Instructions (Addendum)
[name], it was a pleasure seeing you today! You endorsed feeling well today. Below are some of the things we talked about this visit. We look forward to seeing you in the follow up appointment!  Today we discussed: You presented for hospital follow-up.  You were discharged with antibiotics for an infection.  Please continue taking the antibiotic and finish the course.  The GI symptoms you are having should go away after you complete antibiotics. The pain that you get with urination can happen after getting radiation of the prostate. We will do lab work today and let you know the results when I get them back. Please make sure you follow-up with GI doctor which is a stomach doctor, urology, and radiation oncology. Your blood pressure is doing well without any blood pressure medication, we will continue to hold your blood pressure medication and if blood pressure increases then we will start it.  Please give [place] a call if your blood pressure continues to be elevated.  I have ordered the following labs today:  Lab Orders         CBC with Diff         CMP14 + Anion Gap        Referrals ordered today:   Referral Orders  No referral(s) requested today     I have ordered the following medication/changed the following medications:   Stop the following medications: There are no discontinued medications.   Start the following medications: No orders of the defined types were placed in this encounter.    Follow-up: 1 month follow up.   Please make sure to arrive 15 minutes prior to your next appointment. If you arrive late, you may be asked to reschedule.   We look forward to seeing you next time. Please call our clinic at [phone] if you have any questions or concerns. The best time to call is Monday-Friday from 9am-4pm, but there is someone available 24/7. If after hours or the weekend, call the main hospital number and ask for the Internal Medicine Resident On-Call. If you  need medication refills, please notify your pharmacy one week in advance and they will send [place] a request.  Thank you for letting us take part in your care. Wishing you the best!  Thank you, Idamae Schuller, MD

## 2021-07-27 ENCOUNTER — Ambulatory Visit: Payer: Medicare HMO

## 2021-07-27 DIAGNOSIS — K627 Radiation proctitis: Secondary | ICD-10-CM | POA: Diagnosis not present

## 2021-07-27 LAB — CMP14 + ANION GAP
ALT: 25 IU/L (ref 0–44)
AST: 27 IU/L (ref 0–40)
Albumin/Globulin Ratio: 1.3 (ref 1.2–2.2)
Albumin: 4 g/dL (ref 3.7–4.7)
Alkaline Phosphatase: 57 IU/L (ref 44–121)
Anion Gap: 15 mmol/L (ref 10.0–18.0)
BUN/Creatinine Ratio: 17 (ref 10–24)
BUN: 15 mg/dL (ref 8–27)
Bilirubin Total: 0.2 mg/dL (ref 0.0–1.2)
CO2: 25 mmol/L (ref 20–29)
Calcium: 9.6 mg/dL (ref 8.6–10.2)
Chloride: 96 mmol/L (ref 96–106)
Creatinine, Ser: 0.87 mg/dL (ref 0.76–1.27)
Globulin, Total: 3 g/dL (ref 1.5–4.5)
Glucose: 80 mg/dL (ref 70–99)
Potassium: 4 mmol/L (ref 3.5–5.2)
Sodium: 136 mmol/L (ref 134–144)
Total Protein: 7 g/dL (ref 6.0–8.5)
eGFR: 87 mL/min/{1.73_m2} (ref >59)

## 2021-07-27 LAB — CBC WITH DIFFERENTIAL/PLATELET
Basophils Absolute: 0 10*3/uL (ref 0.0–0.2)
Basos: 1 %
EOS (ABSOLUTE): 0.3 10*3/uL (ref 0.0–0.4)
Eos: 7 %
Hematocrit: 42.1 % (ref 37.5–51.0)
Hemoglobin: 13.9 g/dL (ref 13.0–17.7)
Immature Grans (Abs): 0 10*3/uL (ref 0.0–0.1)
Immature Granulocytes: 1 %
Lymphocytes Absolute: 0.6 10*3/uL — ABNORMAL LOW (ref 0.7–3.1)
Lymphs: 15 %
MCH: 28.6 pg (ref 26.6–33.0)
MCHC: 33 g/dL (ref 31.5–35.7)
MCV: 87 fL (ref 79–97)
Monocytes Absolute: 0.5 10*3/uL (ref 0.1–0.9)
Monocytes: 12 %
Neutrophils Absolute: 2.5 10*3/uL (ref 1.4–7.0)
Neutrophils: 64 %
Platelets: 360 10*3/uL (ref 150–450)
RBC: 4.86 x10E6/uL (ref 4.14–5.80)
RDW: 14.1 % (ref 11.6–15.4)
WBC: 3.9 10*3/uL (ref 3.4–10.8)

## 2021-07-27 NOTE — Progress Notes (Unsigned)
Internal Medicine Clinic Attending  Case discussed with Dr. Khan  At the time of the visit.  We reviewed the resident's history and exam and pertinent patient test results.  I agree with the assessment, diagnosis, and plan of care documented in the resident's note.  

## 2021-07-28 DIAGNOSIS — K627 Radiation proctitis: Secondary | ICD-10-CM | POA: Insufficient documentation

## 2021-07-28 HISTORY — DX: Radiation proctitis: K62.7

## 2021-07-28 NOTE — Assessment & Plan Note (Signed)
He presented to hospital after syncopal episode and had abdominal pain and ABLA. He was given 2 units during his hospitalization. 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 [facility] saw patient during his hospitalization and recommend outpatient follow up as patient was stable.  -Repeat CBC this visit showed resolved anemia.  -Outpatient follow up.

## 2021-07-28 NOTE — Assessment & Plan Note (Addendum)
Patient had syncopal episode that required hospitalization. Cardiac testing was performed and was negative. His anti-hypertensive were stopped at discharge. His blood pressure was 127/69. I will hold his anti-hypertensive and advised patient and his son to call our clinic if it is elevated and we can restart his anti-hypertensive. He is endorsing diarrhea which may be secondary to antibiotics so we will make sure his does not become hypotensive again.  -Hold anti-hypertensive medications currently.

## 2021-07-28 NOTE — Assessment & Plan Note (Signed)
Resolved on recent testing. Previous RUQ ultrasound was negative.

## 2021-07-28 NOTE — Assessment & Plan Note (Signed)
Patient has HTN and is on lisinopril-HCTZ 20-12.5 mg qd. His antihypertensives were held due to his syncope and hypotension during his hospitalization. Patient son states he check his blood pressure every day and it has been normal. I will continue to hold his antihypertensive as patient is having diarrhea with his antibiotic use. I advised patient and his son to call our clinic if his blood pressure becomes elevated and we will restart his home anti-hypertensive.  -Hold patient's antihypertensive -Restart anti-hypertensive if patient's bp becomes elevated.

## 2021-07-28 NOTE — Assessment & Plan Note (Signed)
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 He currently taking Cefdenir 300 mg BID, and Flagyl 500 mg BID with end date of 07/29/21. Patient is to follow up with GI, urology, and radiation oncology.  -Follow up with GI, urology, and radiation oncology.

## 2021-07-30 ENCOUNTER — Ambulatory Visit: Payer: Medicare HMO

## 2021-08-01 ENCOUNTER — Ambulatory Visit: Payer: Medicare HMO

## 2021-08-01 DIAGNOSIS — C61 Malignant neoplasm of prostate: Secondary | ICD-10-CM | POA: Diagnosis not present

## 2021-08-02 ENCOUNTER — Ambulatory Visit: Payer: Medicare HMO

## 2021-08-03 ENCOUNTER — Ambulatory Visit: Payer: Medicare HMO

## 2021-08-06 ENCOUNTER — Other Ambulatory Visit: Payer: Self-pay

## 2021-08-06 ENCOUNTER — Ambulatory Visit: Payer: Medicare HMO

## 2021-08-06 ENCOUNTER — Ambulatory Visit
Admission: RE | Admit: 2021-08-06 | Discharge: 2021-08-06 | Disposition: A | Discharge status: Home / Self Care | Payer: Medicare HMO | Source: Ambulatory Visit | Attending: Radiation Oncology | Admitting: Radiation Oncology

## 2021-08-06 DIAGNOSIS — Z510 Encounter for antineoplastic radiation therapy: Secondary | ICD-10-CM | POA: Insufficient documentation

## 2021-08-06 DIAGNOSIS — C61 Malignant neoplasm of prostate: Secondary | ICD-10-CM | POA: Diagnosis not present

## 2021-08-06 DIAGNOSIS — R3915 Urgency of urination: Secondary | ICD-10-CM | POA: Diagnosis not present

## 2021-08-06 DIAGNOSIS — R3911 Hesitancy of micturition: Secondary | ICD-10-CM | POA: Diagnosis not present

## 2021-08-06 DIAGNOSIS — R3912 Poor urinary stream: Secondary | ICD-10-CM | POA: Diagnosis not present

## 2021-08-06 DIAGNOSIS — Z191 Hormone sensitive malignancy status: Secondary | ICD-10-CM | POA: Diagnosis not present

## 2021-08-06 DIAGNOSIS — R300 Dysuria: Secondary | ICD-10-CM | POA: Diagnosis not present

## 2021-08-06 LAB — RAD ONC ARIA SESSION SUMMARY
Course Elapsed Days: 39
Plan Fractions Treated to Date: 16
Plan Prescribed Dose Per Fraction: 1.8 Gy
Plan Total Fractions Prescribed: 25
Plan Total Prescribed Dose: 45 Gy
Reference Point Dosage Given to Date: 28.8 Gy
Reference Point Session Dosage Given: 1.8 Gy
Session Number: 16

## 2021-08-07 ENCOUNTER — Ambulatory Visit: Payer: Medicare HMO

## 2021-08-07 ENCOUNTER — Other Ambulatory Visit: Payer: Self-pay

## 2021-08-07 ENCOUNTER — Ambulatory Visit
Admission: RE | Admit: 2021-08-07 | Discharge: 2021-08-07 | Disposition: A | Discharge status: Home / Self Care | Payer: Medicare HMO | Source: Ambulatory Visit | Attending: Radiation Oncology | Admitting: Radiation Oncology

## 2021-08-07 DIAGNOSIS — C61 Malignant neoplasm of prostate: Secondary | ICD-10-CM | POA: Diagnosis not present

## 2021-08-07 DIAGNOSIS — R3912 Poor urinary stream: Secondary | ICD-10-CM | POA: Diagnosis not present

## 2021-08-07 DIAGNOSIS — Z191 Hormone sensitive malignancy status: Secondary | ICD-10-CM | POA: Diagnosis not present

## 2021-08-07 DIAGNOSIS — Z510 Encounter for antineoplastic radiation therapy: Secondary | ICD-10-CM | POA: Diagnosis not present

## 2021-08-07 DIAGNOSIS — R3911 Hesitancy of micturition: Secondary | ICD-10-CM | POA: Diagnosis not present

## 2021-08-07 DIAGNOSIS — R3915 Urgency of urination: Secondary | ICD-10-CM | POA: Diagnosis not present

## 2021-08-07 DIAGNOSIS — R300 Dysuria: Secondary | ICD-10-CM | POA: Diagnosis not present

## 2021-08-07 LAB — RAD ONC ARIA SESSION SUMMARY
Course Elapsed Days: 40
Plan Fractions Treated to Date: 17
Plan Prescribed Dose Per Fraction: 1.8 Gy
Plan Total Fractions Prescribed: 25
Plan Total Prescribed Dose: 45 Gy
Reference Point Dosage Given to Date: 30.6 Gy
Reference Point Session Dosage Given: 1.8 Gy
Session Number: 17

## 2021-08-08 ENCOUNTER — Ambulatory Visit
Admission: RE | Admit: 2021-08-08 | Discharge: 2021-08-08 | Disposition: A | Discharge status: Home / Self Care | Payer: Medicare HMO | Source: Ambulatory Visit | Attending: Radiation Oncology | Admitting: Radiation Oncology

## 2021-08-08 ENCOUNTER — Other Ambulatory Visit: Payer: Self-pay

## 2021-08-08 ENCOUNTER — Ambulatory Visit: Payer: Medicare HMO

## 2021-08-08 ENCOUNTER — Ambulatory Visit: Payer: Medicare HMO | Admitting: Podiatry

## 2021-08-08 DIAGNOSIS — Z510 Encounter for antineoplastic radiation therapy: Secondary | ICD-10-CM | POA: Diagnosis not present

## 2021-08-08 DIAGNOSIS — C61 Malignant neoplasm of prostate: Secondary | ICD-10-CM | POA: Diagnosis not present

## 2021-08-08 DIAGNOSIS — Z191 Hormone sensitive malignancy status: Secondary | ICD-10-CM | POA: Diagnosis not present

## 2021-08-08 LAB — RAD ONC ARIA SESSION SUMMARY
Course Elapsed Days: 41
Plan Fractions Treated to Date: 18
Plan Prescribed Dose Per Fraction: 1.8 Gy
Plan Total Fractions Prescribed: 25
Plan Total Prescribed Dose: 45 Gy
Reference Point Dosage Given to Date: 32.4 Gy
Reference Point Session Dosage Given: 1.8 Gy
Session Number: 18

## 2021-08-09 ENCOUNTER — Ambulatory Visit
Admission: RE | Admit: 2021-08-09 | Discharge: 2021-08-09 | Disposition: A | Discharge status: Home / Self Care | Payer: Medicare HMO | Source: Ambulatory Visit | Attending: Radiation Oncology | Admitting: Radiation Oncology

## 2021-08-09 ENCOUNTER — Other Ambulatory Visit: Payer: Self-pay

## 2021-08-09 ENCOUNTER — Ambulatory Visit: Payer: Medicare HMO

## 2021-08-09 DIAGNOSIS — C61 Malignant neoplasm of prostate: Secondary | ICD-10-CM | POA: Diagnosis not present

## 2021-08-09 DIAGNOSIS — Z191 Hormone sensitive malignancy status: Secondary | ICD-10-CM | POA: Diagnosis not present

## 2021-08-09 DIAGNOSIS — Z510 Encounter for antineoplastic radiation therapy: Secondary | ICD-10-CM | POA: Diagnosis not present

## 2021-08-09 LAB — RAD ONC ARIA SESSION SUMMARY
Course Elapsed Days: 42
Plan Fractions Treated to Date: 19
Plan Prescribed Dose Per Fraction: 1.8 Gy
Plan Total Fractions Prescribed: 25
Plan Total Prescribed Dose: 45 Gy
Reference Point Dosage Given to Date: 34.2 Gy
Reference Point Session Dosage Given: 1.8 Gy
Session Number: 19

## 2021-08-10 ENCOUNTER — Ambulatory Visit: Payer: Medicare HMO

## 2021-08-10 ENCOUNTER — Other Ambulatory Visit: Payer: Self-pay

## 2021-08-10 ENCOUNTER — Ambulatory Visit
Admission: RE | Admit: 2021-08-10 | Discharge: 2021-08-10 | Disposition: A | Discharge status: Home / Self Care | Payer: Medicare HMO | Source: Ambulatory Visit | Attending: Radiation Oncology | Admitting: Radiation Oncology

## 2021-08-10 DIAGNOSIS — C61 Malignant neoplasm of prostate: Secondary | ICD-10-CM | POA: Diagnosis not present

## 2021-08-10 DIAGNOSIS — Z510 Encounter for antineoplastic radiation therapy: Secondary | ICD-10-CM | POA: Diagnosis not present

## 2021-08-10 DIAGNOSIS — Z191 Hormone sensitive malignancy status: Secondary | ICD-10-CM | POA: Diagnosis not present

## 2021-08-10 LAB — RAD ONC ARIA SESSION SUMMARY
Course Elapsed Days: 43
Plan Fractions Treated to Date: 20
Plan Prescribed Dose Per Fraction: 1.8 Gy
Plan Total Fractions Prescribed: 25
Plan Total Prescribed Dose: 45 Gy
Reference Point Dosage Given to Date: 36 Gy
Reference Point Session Dosage Given: 1.8 Gy
Session Number: 20

## 2021-08-13 ENCOUNTER — Ambulatory Visit
Admission: RE | Admit: 2021-08-13 | Discharge: 2021-08-13 | Disposition: A | Discharge status: Home / Self Care | Payer: Medicare HMO | Source: Ambulatory Visit | Attending: Radiation Oncology | Admitting: Radiation Oncology

## 2021-08-13 ENCOUNTER — Other Ambulatory Visit: Payer: Self-pay

## 2021-08-13 ENCOUNTER — Ambulatory Visit: Payer: Medicare HMO

## 2021-08-13 DIAGNOSIS — C61 Malignant neoplasm of prostate: Secondary | ICD-10-CM | POA: Diagnosis not present

## 2021-08-13 DIAGNOSIS — Z510 Encounter for antineoplastic radiation therapy: Secondary | ICD-10-CM | POA: Diagnosis not present

## 2021-08-13 DIAGNOSIS — Z191 Hormone sensitive malignancy status: Secondary | ICD-10-CM | POA: Diagnosis not present

## 2021-08-13 LAB — RAD ONC ARIA SESSION SUMMARY
Course Elapsed Days: 46
Plan Fractions Treated to Date: 21
Plan Prescribed Dose Per Fraction: 1.8 Gy
Plan Total Fractions Prescribed: 25
Plan Total Prescribed Dose: 45 Gy
Reference Point Dosage Given to Date: 37.8 Gy
Reference Point Session Dosage Given: 1.8 Gy
Session Number: 21

## 2021-08-14 ENCOUNTER — Ambulatory Visit
Admission: RE | Admit: 2021-08-14 | Discharge: 2021-08-14 | Disposition: A | Discharge status: Home / Self Care | Payer: Medicare HMO | Source: Ambulatory Visit | Attending: Radiation Oncology | Admitting: Radiation Oncology

## 2021-08-14 ENCOUNTER — Ambulatory Visit: Payer: Medicare HMO

## 2021-08-14 ENCOUNTER — Other Ambulatory Visit: Payer: Self-pay

## 2021-08-14 DIAGNOSIS — Z510 Encounter for antineoplastic radiation therapy: Secondary | ICD-10-CM | POA: Diagnosis not present

## 2021-08-14 DIAGNOSIS — C61 Malignant neoplasm of prostate: Secondary | ICD-10-CM | POA: Diagnosis not present

## 2021-08-14 DIAGNOSIS — Z191 Hormone sensitive malignancy status: Secondary | ICD-10-CM | POA: Diagnosis not present

## 2021-08-14 LAB — RAD ONC ARIA SESSION SUMMARY
Course Elapsed Days: 47
Plan Fractions Treated to Date: 22
Plan Prescribed Dose Per Fraction: 1.8 Gy
Plan Total Fractions Prescribed: 25
Plan Total Prescribed Dose: 45 Gy
Reference Point Dosage Given to Date: 39.6 Gy
Reference Point Session Dosage Given: 1.8 Gy
Session Number: 22

## 2021-08-15 ENCOUNTER — Other Ambulatory Visit: Payer: Self-pay

## 2021-08-15 ENCOUNTER — Ambulatory Visit: Payer: Medicare HMO

## 2021-08-15 ENCOUNTER — Ambulatory Visit
Admission: RE | Admit: 2021-08-15 | Discharge: 2021-08-15 | Disposition: A | Discharge status: Home / Self Care | Payer: Medicare HMO | Source: Ambulatory Visit | Attending: Radiation Oncology | Admitting: Radiation Oncology

## 2021-08-15 DIAGNOSIS — Z510 Encounter for antineoplastic radiation therapy: Secondary | ICD-10-CM | POA: Diagnosis not present

## 2021-08-15 DIAGNOSIS — C61 Malignant neoplasm of prostate: Secondary | ICD-10-CM | POA: Diagnosis not present

## 2021-08-15 DIAGNOSIS — Z191 Hormone sensitive malignancy status: Secondary | ICD-10-CM | POA: Diagnosis not present

## 2021-08-15 LAB — RAD ONC ARIA SESSION SUMMARY
Course Elapsed Days: 48
Plan Fractions Treated to Date: 23
Plan Prescribed Dose Per Fraction: 1.8 Gy
Plan Total Fractions Prescribed: 25
Plan Total Prescribed Dose: 45 Gy
Reference Point Dosage Given to Date: 41.4 Gy
Reference Point Session Dosage Given: 1.8 Gy
Session Number: 23

## 2021-08-16 ENCOUNTER — Other Ambulatory Visit: Payer: Self-pay

## 2021-08-16 ENCOUNTER — Ambulatory Visit: Payer: Medicare HMO

## 2021-08-16 DIAGNOSIS — Z510 Encounter for antineoplastic radiation therapy: Secondary | ICD-10-CM | POA: Diagnosis not present

## 2021-08-16 DIAGNOSIS — C61 Malignant neoplasm of prostate: Secondary | ICD-10-CM | POA: Diagnosis not present

## 2021-08-16 DIAGNOSIS — Z191 Hormone sensitive malignancy status: Secondary | ICD-10-CM | POA: Diagnosis not present

## 2021-08-16 LAB — RAD ONC ARIA SESSION SUMMARY
Course Elapsed Days: 49
Plan Fractions Treated to Date: 24
Plan Prescribed Dose Per Fraction: 1.8 Gy
Plan Total Fractions Prescribed: 25
Plan Total Prescribed Dose: 45 Gy
Reference Point Dosage Given to Date: 43.2 Gy
Reference Point Session Dosage Given: 1.8 Gy
Session Number: 24

## 2021-08-17 ENCOUNTER — Ambulatory Visit
Admission: RE | Admit: 2021-08-17 | Discharge: 2021-08-17 | Disposition: A | Discharge status: Home / Self Care | Payer: Medicare HMO | Source: Ambulatory Visit | Attending: Radiation Oncology | Admitting: Radiation Oncology

## 2021-08-17 ENCOUNTER — Ambulatory Visit: Payer: Medicare HMO

## 2021-08-17 ENCOUNTER — Other Ambulatory Visit: Payer: Self-pay

## 2021-08-17 DIAGNOSIS — Z510 Encounter for antineoplastic radiation therapy: Secondary | ICD-10-CM | POA: Diagnosis not present

## 2021-08-17 DIAGNOSIS — Z191 Hormone sensitive malignancy status: Secondary | ICD-10-CM | POA: Diagnosis not present

## 2021-08-17 DIAGNOSIS — C61 Malignant neoplasm of prostate: Secondary | ICD-10-CM | POA: Diagnosis not present

## 2021-08-17 LAB — RAD ONC ARIA SESSION SUMMARY
Course Elapsed Days: 50
Plan Fractions Treated to Date: 25
Plan Prescribed Dose Per Fraction: 1.8 Gy
Plan Total Fractions Prescribed: 25
Plan Total Prescribed Dose: 45 Gy
Reference Point Dosage Given to Date: 45 Gy
Reference Point Session Dosage Given: 1.8 Gy
Session Number: 25

## 2021-08-20 ENCOUNTER — Other Ambulatory Visit: Payer: Self-pay

## 2021-08-20 ENCOUNTER — Ambulatory Visit: Payer: Medicare HMO

## 2021-08-20 DIAGNOSIS — Z510 Encounter for antineoplastic radiation therapy: Secondary | ICD-10-CM | POA: Diagnosis not present

## 2021-08-20 DIAGNOSIS — Z191 Hormone sensitive malignancy status: Secondary | ICD-10-CM | POA: Diagnosis not present

## 2021-08-20 DIAGNOSIS — C61 Malignant neoplasm of prostate: Secondary | ICD-10-CM | POA: Diagnosis not present

## 2021-08-20 LAB — RAD ONC ARIA SESSION SUMMARY
Course Elapsed Days: 53
Plan Fractions Treated to Date: 1
Plan Prescribed Dose Per Fraction: 2 Gy
Plan Total Fractions Prescribed: 15
Plan Total Prescribed Dose: 30 Gy
Reference Point Dosage Given to Date: 47 Gy
Reference Point Session Dosage Given: 2 Gy
Session Number: 26

## 2021-08-21 ENCOUNTER — Ambulatory Visit: Payer: Medicare HMO

## 2021-08-21 ENCOUNTER — Other Ambulatory Visit: Payer: Self-pay

## 2021-08-21 ENCOUNTER — Ambulatory Visit
Admission: RE | Admit: 2021-08-21 | Discharge: 2021-08-21 | Disposition: A | Discharge status: Home / Self Care | Payer: Medicare HMO | Source: Ambulatory Visit | Attending: Radiation Oncology | Admitting: Radiation Oncology

## 2021-08-21 DIAGNOSIS — C61 Malignant neoplasm of prostate: Secondary | ICD-10-CM | POA: Diagnosis not present

## 2021-08-21 DIAGNOSIS — Z510 Encounter for antineoplastic radiation therapy: Secondary | ICD-10-CM | POA: Diagnosis not present

## 2021-08-21 DIAGNOSIS — Z191 Hormone sensitive malignancy status: Secondary | ICD-10-CM | POA: Diagnosis not present

## 2021-08-21 LAB — RAD ONC ARIA SESSION SUMMARY
Course Elapsed Days: 54
Plan Fractions Treated to Date: 2
Plan Prescribed Dose Per Fraction: 2 Gy
Plan Total Fractions Prescribed: 15
Plan Total Prescribed Dose: 30 Gy
Reference Point Dosage Given to Date: 49 Gy
Reference Point Session Dosage Given: 2 Gy
Session Number: 27

## 2021-08-22 ENCOUNTER — Other Ambulatory Visit: Payer: Self-pay

## 2021-08-22 ENCOUNTER — Ambulatory Visit: Payer: Medicare HMO

## 2021-08-22 ENCOUNTER — Ambulatory Visit
Admission: RE | Admit: 2021-08-22 | Discharge: 2021-08-22 | Disposition: A | Discharge status: Home / Self Care | Payer: Medicare HMO | Source: Ambulatory Visit | Attending: Radiation Oncology | Admitting: Radiation Oncology

## 2021-08-22 DIAGNOSIS — C61 Malignant neoplasm of prostate: Secondary | ICD-10-CM | POA: Diagnosis not present

## 2021-08-22 DIAGNOSIS — Z510 Encounter for antineoplastic radiation therapy: Secondary | ICD-10-CM | POA: Diagnosis not present

## 2021-08-22 DIAGNOSIS — Z191 Hormone sensitive malignancy status: Secondary | ICD-10-CM | POA: Diagnosis not present

## 2021-08-22 LAB — RAD ONC ARIA SESSION SUMMARY
Course Elapsed Days: 55
Plan Fractions Treated to Date: 3
Plan Prescribed Dose Per Fraction: 2 Gy
Plan Total Fractions Prescribed: 15
Plan Total Prescribed Dose: 30 Gy
Reference Point Dosage Given to Date: 51 Gy
Reference Point Session Dosage Given: 2 Gy
Session Number: 28

## 2021-08-23 ENCOUNTER — Ambulatory Visit
Admission: RE | Admit: 2021-08-23 | Discharge: 2021-08-23 | Disposition: A | Discharge status: Home / Self Care | Payer: Medicare HMO | Source: Ambulatory Visit | Attending: Radiation Oncology | Admitting: Radiation Oncology

## 2021-08-23 ENCOUNTER — Ambulatory Visit: Payer: Medicare HMO

## 2021-08-23 ENCOUNTER — Other Ambulatory Visit: Payer: Self-pay

## 2021-08-23 DIAGNOSIS — Z510 Encounter for antineoplastic radiation therapy: Secondary | ICD-10-CM | POA: Diagnosis not present

## 2021-08-23 DIAGNOSIS — Z191 Hormone sensitive malignancy status: Secondary | ICD-10-CM | POA: Diagnosis not present

## 2021-08-23 DIAGNOSIS — C61 Malignant neoplasm of prostate: Secondary | ICD-10-CM | POA: Diagnosis not present

## 2021-08-23 LAB — RAD ONC ARIA SESSION SUMMARY
Course Elapsed Days: 56
Plan Fractions Treated to Date: 4
Plan Prescribed Dose Per Fraction: 2 Gy
Plan Total Fractions Prescribed: 15
Plan Total Prescribed Dose: 30 Gy
Reference Point Dosage Given to Date: 53 Gy
Reference Point Session Dosage Given: 2 Gy
Session Number: 29

## 2021-08-24 ENCOUNTER — Ambulatory Visit: Payer: Medicare HMO

## 2021-08-24 ENCOUNTER — Other Ambulatory Visit: Payer: Self-pay

## 2021-08-24 ENCOUNTER — Ambulatory Visit
Admission: RE | Admit: 2021-08-24 | Discharge: 2021-08-24 | Disposition: A | Discharge status: Home / Self Care | Payer: Medicare HMO | Source: Ambulatory Visit | Attending: Radiation Oncology | Admitting: Radiation Oncology

## 2021-08-24 DIAGNOSIS — K627 Radiation proctitis: Secondary | ICD-10-CM | POA: Diagnosis not present

## 2021-08-24 DIAGNOSIS — Z191 Hormone sensitive malignancy status: Secondary | ICD-10-CM | POA: Diagnosis not present

## 2021-08-24 DIAGNOSIS — Z510 Encounter for antineoplastic radiation therapy: Secondary | ICD-10-CM | POA: Diagnosis not present

## 2021-08-24 DIAGNOSIS — C61 Malignant neoplasm of prostate: Secondary | ICD-10-CM | POA: Diagnosis not present

## 2021-08-24 LAB — RAD ONC ARIA SESSION SUMMARY
Course Elapsed Days: 57
Plan Fractions Treated to Date: 5
Plan Prescribed Dose Per Fraction: 2 Gy
Plan Total Fractions Prescribed: 15
Plan Total Prescribed Dose: 30 Gy
Reference Point Dosage Given to Date: 55 Gy
Reference Point Session Dosage Given: 2 Gy
Session Number: 30

## 2021-08-27 ENCOUNTER — Ambulatory Visit: Payer: Medicare HMO

## 2021-08-28 ENCOUNTER — Other Ambulatory Visit: Payer: Self-pay

## 2021-08-28 ENCOUNTER — Ambulatory Visit
Admission: RE | Admit: 2021-08-28 | Discharge: 2021-08-28 | Disposition: A | Discharge status: Home / Self Care | Payer: Medicare HMO | Source: Ambulatory Visit | Attending: Radiation Oncology | Admitting: Radiation Oncology

## 2021-08-28 DIAGNOSIS — Z191 Hormone sensitive malignancy status: Secondary | ICD-10-CM | POA: Diagnosis not present

## 2021-08-28 DIAGNOSIS — C61 Malignant neoplasm of prostate: Secondary | ICD-10-CM | POA: Insufficient documentation

## 2021-08-28 DIAGNOSIS — Z510 Encounter for antineoplastic radiation therapy: Secondary | ICD-10-CM | POA: Diagnosis not present

## 2021-08-28 LAB — RAD ONC ARIA SESSION SUMMARY
Course Elapsed Days: 61
Plan Fractions Treated to Date: 6
Plan Prescribed Dose Per Fraction: 2 Gy
Plan Total Fractions Prescribed: 15
Plan Total Prescribed Dose: 30 Gy
Reference Point Dosage Given to Date: 57 Gy
Reference Point Session Dosage Given: 2 Gy
Session Number: 31

## 2021-08-29 ENCOUNTER — Encounter: Payer: Self-pay | Admitting: Internal Medicine

## 2021-08-29 ENCOUNTER — Other Ambulatory Visit: Payer: Self-pay

## 2021-08-29 ENCOUNTER — Ambulatory Visit
Admission: RE | Admit: 2021-08-29 | Discharge: 2021-08-29 | Disposition: A | Discharge status: Home / Self Care | Payer: Medicare HMO | Source: Ambulatory Visit | Attending: Radiation Oncology | Admitting: Radiation Oncology

## 2021-08-29 DIAGNOSIS — Z191 Hormone sensitive malignancy status: Secondary | ICD-10-CM | POA: Diagnosis not present

## 2021-08-29 DIAGNOSIS — Z86718 Personal history of other venous thrombosis and embolism: Secondary | ICD-10-CM | POA: Insufficient documentation

## 2021-08-29 DIAGNOSIS — C61 Malignant neoplasm of prostate: Secondary | ICD-10-CM | POA: Diagnosis not present

## 2021-08-29 DIAGNOSIS — Z510 Encounter for antineoplastic radiation therapy: Secondary | ICD-10-CM | POA: Diagnosis not present

## 2021-08-29 LAB — RAD ONC ARIA SESSION SUMMARY
Course Elapsed Days: 62
Plan Fractions Treated to Date: 7
Plan Prescribed Dose Per Fraction: 2 Gy
Plan Total Fractions Prescribed: 15
Plan Total Prescribed Dose: 30 Gy
Reference Point Dosage Given to Date: 59 Gy
Reference Point Session Dosage Given: 2 Gy
Session Number: 32

## 2021-08-29 NOTE — Assessment & Plan Note (Signed)
277 on 07/16/21. Not currently on supplement.

## 2021-08-30 ENCOUNTER — Other Ambulatory Visit: Payer: Self-pay

## 2021-08-30 ENCOUNTER — Ambulatory Visit
Admission: RE | Admit: 2021-08-30 | Discharge: 2021-08-30 | Disposition: A | Discharge status: Home / Self Care | Payer: Medicare HMO | Source: Ambulatory Visit | Attending: Radiation Oncology | Admitting: Radiation Oncology

## 2021-08-30 ENCOUNTER — Ambulatory Visit (INDEPENDENT_AMBULATORY_CARE_PROVIDER_SITE_OTHER): Payer: Medicare HMO | Admitting: *Deleted

## 2021-08-30 ENCOUNTER — Encounter: Payer: Self-pay | Admitting: Internal Medicine

## 2021-08-30 ENCOUNTER — Encounter: Payer: Self-pay | Admitting: *Deleted

## 2021-08-30 ENCOUNTER — Ambulatory Visit (INDEPENDENT_AMBULATORY_CARE_PROVIDER_SITE_OTHER): Payer: Medicare HMO | Admitting: Internal Medicine

## 2021-08-30 VITALS — BP 126/63 | HR 72 | Wt 178.9 lb

## 2021-08-30 DIAGNOSIS — B353 Tinea pedis: Secondary | ICD-10-CM

## 2021-08-30 DIAGNOSIS — M1A0710 Idiopathic chronic gout, right ankle and foot, without tophus (tophi): Secondary | ICD-10-CM

## 2021-08-30 DIAGNOSIS — Z0000 Encounter for general adult medical examination without abnormal findings: Secondary | ICD-10-CM | POA: Diagnosis not present

## 2021-08-30 DIAGNOSIS — L209 Atopic dermatitis, unspecified: Secondary | ICD-10-CM | POA: Diagnosis not present

## 2021-08-30 DIAGNOSIS — Z191 Hormone sensitive malignancy status: Secondary | ICD-10-CM | POA: Diagnosis not present

## 2021-08-30 DIAGNOSIS — G3184 Mild cognitive impairment, so stated: Secondary | ICD-10-CM | POA: Insufficient documentation

## 2021-08-30 DIAGNOSIS — C61 Malignant neoplasm of prostate: Secondary | ICD-10-CM

## 2021-08-30 DIAGNOSIS — Z86718 Personal history of other venous thrombosis and embolism: Secondary | ICD-10-CM

## 2021-08-30 DIAGNOSIS — H9193 Unspecified hearing loss, bilateral: Secondary | ICD-10-CM

## 2021-08-30 DIAGNOSIS — I10 Essential (primary) hypertension: Secondary | ICD-10-CM

## 2021-08-30 DIAGNOSIS — M170 Bilateral primary osteoarthritis of knee: Secondary | ICD-10-CM

## 2021-08-30 DIAGNOSIS — K627 Radiation proctitis: Secondary | ICD-10-CM

## 2021-08-30 DIAGNOSIS — I700 Atherosclerosis of aorta: Secondary | ICD-10-CM | POA: Diagnosis not present

## 2021-08-30 DIAGNOSIS — H409 Unspecified glaucoma: Secondary | ICD-10-CM

## 2021-08-30 DIAGNOSIS — M1710 Unilateral primary osteoarthritis, unspecified knee: Secondary | ICD-10-CM

## 2021-08-30 DIAGNOSIS — E538 Deficiency of other specified B group vitamins: Secondary | ICD-10-CM

## 2021-08-30 DIAGNOSIS — Z510 Encounter for antineoplastic radiation therapy: Secondary | ICD-10-CM | POA: Diagnosis not present

## 2021-08-30 DIAGNOSIS — Z550 Illiteracy and low-level literacy: Secondary | ICD-10-CM | POA: Insufficient documentation

## 2021-08-30 LAB — RAD ONC ARIA SESSION SUMMARY
Course Elapsed Days: 63
Plan Fractions Treated to Date: 8
Plan Prescribed Dose Per Fraction: 2 Gy
Plan Total Fractions Prescribed: 15
Plan Total Prescribed Dose: 30 Gy
Reference Point Dosage Given to Date: 61 Gy
Reference Point Session Dosage Given: 2 Gy
Session Number: 33

## 2021-08-30 MED ORDER — TERBINAFINE HCL 250 MG PO TABS: 250.0000 mg | ORAL_TABLET | Freq: Every day | ORAL | 0 refills | Status: DC

## 2021-08-30 NOTE — Assessment & Plan Note (Signed)
Radiation therapy will be completed this month.  He is doing well.

## 2021-08-30 NOTE — Assessment & Plan Note (Signed)
No longer taking glaucoma drops though he could not share any insight in this.  He needs to go see an eye doctor and his grandson will help him establish an appointment.

## 2021-08-30 NOTE — Assessment & Plan Note (Signed)
Cracked pruritic skin anterior and lateral surfaces of skin with scoriation.  Topical OTC steroid overlaid with emollient such as Eucerin, Lubriderm, Vanicream bid.

## 2021-08-30 NOTE — Assessment & Plan Note (Signed)
No exacerbation since follow-up 2022.  He is watching his diet.  Episodes are infrequent enough to avoid daily medication at this time.

## 2021-08-30 NOTE — Progress Notes (Signed)
81 year old Mr. Bollig is returning to [facility] after a hiatus due to prostate cancer treatment diagnosed over the winter several months ago, consisting of radioactive implants and external beam radiation-he will be completed with his radiation therapy at the end of next week.  Overall doing well at this time, with no pain and no difficulty voiding or moving his bowels.  He did have complications of radiation proctitis leading to hematochezia, acute blood loss anemia, and syncope for which she was transfused in the hospital.  He reports no difficulty since that time.  Currently living with his grandson over the course of this cancer treatment, although I understand this will probably come to an end once therapy is completed.  His only concern today is of skin changes over the lower legs and feet-"bumpy, rough, dark skin, itches" about which he wonders if there is a relationship with his radiation treatment.  Appetite is okay.  He feels like his gait is well-balanced and he has had no falls or near falls and is not concerned about falling.  Looking forward to getting out and doing activities with his friends including fishing and sometimes hunting.  He is driving independently.  Is not taking glaucoma drops and has not seen an ophthalmologist in some time, and feels like his vision needs to be rechecked.  There has been discussion about seeing an audiologist for hearing aids but he has not been enthused about this idea.  Grandson feels that frequent "huh?"  Is usually a request for clarification rather than not being able to hear.  Mr. Folkert reports that he has had no difficulties with his memory or abilities to do things around the house.  ADLs: Independent IADLs: Independent in all though grandson is involved for any support that may be needed.  Physical exam BP 126/63 (BP Location: Right Arm, Patient Position: Sitting, Cuff Size: Normal)   Pulse 73   Temp 97.9 F (36.6 C)  Mr. Bounds is of slight  build, BMI 24.95 today.  He is awake and alert, a very positive attitude, always cheerful.  No apparent distress.  Pushes off to stand from seated position (chair is low-lying for his height).  Normal stable gait.  Able to get onto the exam table slowly and cautiously without assistance.  Lungs are clear to auscultation, heart regular rate and rhythm with no murmur.  Skin turgor is normal.  Lower extremities without edema.  Skin of anterior tibial regions is cracked and excoriated, no draining areas, no ulceration and no papules.  There is a patch of dark and scaly skin over the dorsum of the right foot about 2 cm in diameter.  The plantar skin of both feet is cracked and peeling in a moccasin type distribution, no ulcerations.  Toenails are trimmed.  Dorsalis pedis pulses are very faint (he has no claudication or rest pain).  Mini cog (scanned): Registered 3 words appropriately-clock draw abnormal, with requested time of 1035 written in his words as "a quarter to twelve", which was not in fact 1145.  3 word recall with phonetically similar but incorrect words.  Assessment and plan:  Mr. Isenhower is now doing well after experiencing complications of his radiation rx, which will be completed soon.  He is encouraged to have his vision and hearing professionally checked, and to work on st strengthening his core muscles by standing and sitting in the chair.  He will follow-up in 3 months for the problems listed below.  Aortic atherosclerosis (HCC) Asymptomatic.  Aspirin  has been stopped due to hematochezia in setting of radiation proctitis.  Pedal pulses are reduced.  We will monitor for now.  ABIs could provide further clarity though we would not be proceeding with vascular consultation at this time.  Hypertension, currently not requiring medication. Antihypertensives have been withheld since his hospitalization related to blood loss anemia and hypotension.  He has no current need for therapy and medications  are discontinued for the time being.  Continue to monitor.  Radiation proctitis Symptoms have resolved (no further bleeding, no pain with bowel movements).  Radiation treatments will be completed this month.  OA (osteoarthritis) of knee No pain or limitation in activities.  No thickening or joint line tenderness.  Will place in history section.  Malignant neoplasm of prostate ([place]) treated with radioactive seed implants (and XRT?) Radiation therapy will be completed this month.  He is doing well.    Decreased hearing of both ears I encouraged him to set up a visit with an audiologist.  Glaucoma No longer taking glaucoma drops though he could not share any insight in this.  He needs to go see an eye doctor and his grandson will help him establish an appointment.  Gout, chronic, without tophus No exacerbation since follow-up 2022.  He is watching his diet.  Episodes are infrequent enough to avoid daily medication at this time.  Tinea pedis of both feet Involving bilateral soles and sides of feet with significant scaling and peeling.  Terbinafine 250 mg daily for 1 month.  Mild atopic dermatitis of anterior LE's, provisional dx Cracked pruritic skin anterior and lateral surfaces of skin with scoriation.  Topical OTC steroid overlaid with emollient such as Eucerin, Lubriderm, Vanicream bid.

## 2021-08-30 NOTE — Patient Instructions (Signed)
Fungus infection of feet - take terbinafine tablet 1 every day for 30 days then stop. For the dry cracked skin of legs and top of foot:  apply cortisone cream (over the counter ) to the affected areas then cover with a heavy cream such as Eucerin, Lubriderm, or VaniCream (over the counter) every day twice a day until skin has cleared.  Work on standing and sitting exercises.  Go see an eye doctor and an audiologist!  See you in 3 months.  Take care and stay well,  Dr. Jimmye Norman

## 2021-08-30 NOTE — Patient Instructions (Signed)
Health Maintenance, Male Adopting a healthy lifestyle and getting preventive care are important in promoting health and wellness. Ask your health care provider about: The right schedule for you to have regular tests and exams. Things you can do on your own to prevent diseases and keep yourself healthy. What should I know about diet, weight, and exercise? Eat a healthy diet  Eat a diet that includes plenty of vegetables, fruits, low-fat dairy products, and lean protein. Do not eat a lot of foods that are high in solid fats, added sugars, or sodium. Maintain a healthy weight Body mass index (BMI) is a measurement that can be used to identify possible weight problems. It estimates body fat based on height and weight. Your health care provider can help determine your BMI and help you achieve or maintain a healthy weight. Get regular exercise Get regular exercise. This is one of the most important things you can do for your health. Most adults should: Exercise for at least 150 minutes each week. The exercise should increase your heart rate and make you sweat (moderate-intensity exercise). Do strengthening exercises at least twice a week. This is in addition to the moderate-intensity exercise. Spend less time sitting. Even light physical activity can be beneficial. Watch cholesterol and blood lipids Have your blood tested for lipids and cholesterol at 80 years of age, then have this test every 5 years. You may need to have your cholesterol levels checked more often if: Your lipid or cholesterol levels are high. You are older than 80 years of age. You are at high risk for heart disease. What should I know about cancer screening? Many types of cancers can be detected early and may often be prevented. Depending on your health history and family history, you may need to have cancer screening at various ages. This may include screening for: Colorectal cancer. Prostate cancer. Skin cancer. Lung  cancer. What should I know about heart disease, diabetes, and high blood pressure? Blood pressure and heart disease High blood pressure causes heart disease and increases the risk of stroke. This is more likely to develop in people who have high blood pressure readings or are overweight. Talk with your health care provider about your target blood pressure readings. Have your blood pressure checked: Every 3-5 years if you are 18-39 years of age. Every year if you are 40 years old or older. If you are between the ages of 65 and 75 and are a current or former smoker, ask your health care provider if you should have a one-time screening for abdominal aortic aneurysm (AAA). Diabetes Have regular diabetes screenings. This checks your fasting blood sugar level. Have the screening done: Once every three years after age 45 if you are at a normal weight and have a low risk for diabetes. More often and at a younger age if you are overweight or have a high risk for diabetes. What should I know about preventing infection? Hepatitis B If you have a higher risk for hepatitis B, you should be screened for this virus. Talk with your health care provider to find out if you are at risk for hepatitis B infection. Hepatitis C Blood testing is recommended for: Everyone born from 1945 through 1965. Anyone with known risk factors for hepatitis C. Sexually transmitted infections (STIs) You should be screened each year for STIs, including gonorrhea and chlamydia, if: You are sexually active and are younger than 80 years of age. You are older than 80 years of age and your   health care provider tells you that you are at risk for this type of infection. Your sexual activity has changed since you were last screened, and you are at increased risk for chlamydia or gonorrhea. Ask your health care provider if you are at risk. Ask your health care provider about whether you are at high risk for HIV. Your health care provider  may recommend a prescription medicine to help prevent HIV infection. If you choose to take medicine to prevent HIV, you should first get tested for HIV. You should then be tested every 3 months for as long as you are taking the medicine. Follow these instructions at home: Alcohol use Do not drink alcohol if your health care provider tells you not to drink. If you drink alcohol: Limit how much you have to 0-2 drinks a day. Know how much alcohol is in your drink. In the [place] one drink equals one 12 oz bottle of beer (355 mL), one 5 oz glass of wine (148 mL), or one 1 oz glass of hard liquor (44 mL). Lifestyle Do not use any products that contain nicotine or tobacco. These products include cigarettes, chewing tobacco, and vaping devices, such as e-cigarettes. If you need help quitting, ask your health care provider. Do not use street drugs. Do not share needles. Ask your health care provider for help if you need support or information about quitting drugs. General instructions Schedule regular health, dental, and eye exams. Stay current with your vaccines. Tell your health care provider if: You often feel depressed. You have ever been abused or do not feel safe at home. Summary Adopting a healthy lifestyle and getting preventive care are important in promoting health and wellness. Follow your health care provider's instructions about healthy diet, exercising, and getting tested or screened for diseases. Follow your health care provider's instructions on monitoring your cholesterol and blood pressure. This information is not intended to replace advice given to you by your health care provider. Make sure you discuss any questions you have with your health care provider. Document Revised: 06/05/2020 Document Reviewed: 06/05/2020 Elsevier Patient Education  2023 [employer]  

## 2021-08-30 NOTE — Assessment & Plan Note (Signed)
I encouraged him to set up a visit with an audiologist.

## 2021-08-30 NOTE — Assessment & Plan Note (Signed)
Involving bilateral soles and sides of feet with significant scaling and peeling.  Terbinafine 250 mg daily for 1 month.

## 2021-08-30 NOTE — Assessment & Plan Note (Signed)
Antihypertensives have been withheld since his hospitalization related to blood loss anemia and hypotension.  He has no current need for therapy and medications are discontinued for the time being.  Continue to monitor.

## 2021-08-30 NOTE — Assessment & Plan Note (Signed)
Symptoms have resolved (no further bleeding, no pain with bowel movements).  Radiation treatments will be completed this month.
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
## 2021-08-30 NOTE — Assessment & Plan Note (Signed)
No pain or limitation in activities.  No thickening or joint line tenderness.  Will place in history section.

## 2021-08-30 NOTE — Assessment & Plan Note (Signed)
Asymptomatic.  Aspirin has been stopped due to hematochezia in setting of radiation proctitis.  Pedal pulses are reduced.  We will monitor for now.  ABIs could provide further clarity though we would not be proceeding with vascular consultation at this time.

## 2021-08-31 ENCOUNTER — Other Ambulatory Visit: Payer: Self-pay

## 2021-08-31 ENCOUNTER — Ambulatory Visit
Admission: RE | Admit: 2021-08-31 | Discharge: 2021-08-31 | Disposition: A | Discharge status: Home / Self Care | Payer: Medicare HMO | Source: Ambulatory Visit | Attending: Radiation Oncology | Admitting: Radiation Oncology

## 2021-08-31 DIAGNOSIS — Z510 Encounter for antineoplastic radiation therapy: Secondary | ICD-10-CM | POA: Diagnosis not present

## 2021-08-31 DIAGNOSIS — C61 Malignant neoplasm of prostate: Secondary | ICD-10-CM | POA: Diagnosis not present

## 2021-08-31 DIAGNOSIS — Z191 Hormone sensitive malignancy status: Secondary | ICD-10-CM | POA: Diagnosis not present

## 2021-08-31 LAB — RAD ONC ARIA SESSION SUMMARY
Course Elapsed Days: 64
Plan Fractions Treated to Date: 9
Plan Prescribed Dose Per Fraction: 2 Gy
Plan Total Fractions Prescribed: 15
Plan Total Prescribed Dose: 30 Gy
Reference Point Dosage Given to Date: 63 Gy
Reference Point Session Dosage Given: 2 Gy
Session Number: 34

## 2021-09-03 ENCOUNTER — Other Ambulatory Visit: Payer: Self-pay

## 2021-09-03 ENCOUNTER — Ambulatory Visit
Admission: RE | Admit: 2021-09-03 | Discharge: 2021-09-03 | Disposition: A | Discharge status: Home / Self Care | Payer: Medicare HMO | Source: Ambulatory Visit | Attending: Radiation Oncology | Admitting: Radiation Oncology

## 2021-09-03 DIAGNOSIS — Z510 Encounter for antineoplastic radiation therapy: Secondary | ICD-10-CM | POA: Diagnosis not present

## 2021-09-03 DIAGNOSIS — C61 Malignant neoplasm of prostate: Secondary | ICD-10-CM | POA: Diagnosis not present

## 2021-09-03 DIAGNOSIS — Z191 Hormone sensitive malignancy status: Secondary | ICD-10-CM | POA: Diagnosis not present

## 2021-09-03 LAB — RAD ONC ARIA SESSION SUMMARY
Course Elapsed Days: 67
Plan Fractions Treated to Date: 10
Plan Prescribed Dose Per Fraction: 2 Gy
Plan Total Fractions Prescribed: 15
Plan Total Prescribed Dose: 30 Gy
Reference Point Dosage Given to Date: 65 Gy
Reference Point Session Dosage Given: 2 Gy
Session Number: 35

## 2021-09-04 ENCOUNTER — Ambulatory Visit
Admission: RE | Admit: 2021-09-04 | Discharge: 2021-09-04 | Disposition: A | Discharge status: Home / Self Care | Payer: Medicare HMO | Source: Ambulatory Visit | Attending: Radiation Oncology | Admitting: Radiation Oncology

## 2021-09-04 ENCOUNTER — Other Ambulatory Visit: Payer: Self-pay

## 2021-09-04 DIAGNOSIS — C61 Malignant neoplasm of prostate: Secondary | ICD-10-CM | POA: Diagnosis not present

## 2021-09-04 DIAGNOSIS — Z191 Hormone sensitive malignancy status: Secondary | ICD-10-CM | POA: Diagnosis not present

## 2021-09-04 DIAGNOSIS — Z510 Encounter for antineoplastic radiation therapy: Secondary | ICD-10-CM | POA: Diagnosis not present

## 2021-09-04 LAB — RAD ONC ARIA SESSION SUMMARY
Course Elapsed Days: 68
Plan Fractions Treated to Date: 11
Plan Prescribed Dose Per Fraction: 2 Gy
Plan Total Fractions Prescribed: 15
Plan Total Prescribed Dose: 30 Gy
Reference Point Dosage Given to Date: 67 Gy
Reference Point Session Dosage Given: 2 Gy
Session Number: 36

## 2021-09-05 ENCOUNTER — Ambulatory Visit
Admission: RE | Admit: 2021-09-05 | Discharge: 2021-09-05 | Disposition: A | Discharge status: Home / Self Care | Payer: Medicare HMO | Source: Ambulatory Visit | Attending: Radiation Oncology | Admitting: Radiation Oncology

## 2021-09-05 ENCOUNTER — Other Ambulatory Visit: Payer: Self-pay

## 2021-09-05 ENCOUNTER — Ambulatory Visit: Payer: Medicare HMO

## 2021-09-05 DIAGNOSIS — Z510 Encounter for antineoplastic radiation therapy: Secondary | ICD-10-CM | POA: Diagnosis not present

## 2021-09-05 DIAGNOSIS — Z191 Hormone sensitive malignancy status: Secondary | ICD-10-CM | POA: Diagnosis not present

## 2021-09-05 DIAGNOSIS — C61 Malignant neoplasm of prostate: Secondary | ICD-10-CM | POA: Diagnosis not present

## 2021-09-05 LAB — RAD ONC ARIA SESSION SUMMARY
Course Elapsed Days: 69
Plan Fractions Treated to Date: 12
Plan Prescribed Dose Per Fraction: 2 Gy
Plan Total Fractions Prescribed: 15
Plan Total Prescribed Dose: 30 Gy
Reference Point Dosage Given to Date: 69 Gy
Reference Point Session Dosage Given: 2 Gy
Session Number: 37

## 2021-09-06 ENCOUNTER — Ambulatory Visit
Admission: RE | Admit: 2021-09-06 | Discharge: 2021-09-06 | Disposition: A | Discharge status: Home / Self Care | Payer: Medicare HMO | Source: Ambulatory Visit | Attending: Radiation Oncology | Admitting: Radiation Oncology

## 2021-09-06 ENCOUNTER — Ambulatory Visit: Payer: Medicare HMO

## 2021-09-06 ENCOUNTER — Other Ambulatory Visit: Payer: Self-pay

## 2021-09-06 DIAGNOSIS — Z191 Hormone sensitive malignancy status: Secondary | ICD-10-CM | POA: Diagnosis not present

## 2021-09-06 DIAGNOSIS — C61 Malignant neoplasm of prostate: Secondary | ICD-10-CM | POA: Diagnosis not present

## 2021-09-06 DIAGNOSIS — Z510 Encounter for antineoplastic radiation therapy: Secondary | ICD-10-CM | POA: Diagnosis not present

## 2021-09-06 LAB — RAD ONC ARIA SESSION SUMMARY
Course Elapsed Days: 70
Plan Fractions Treated to Date: 13
Plan Prescribed Dose Per Fraction: 2 Gy
Plan Total Fractions Prescribed: 15
Plan Total Prescribed Dose: 30 Gy
Reference Point Dosage Given to Date: 71 Gy
Reference Point Session Dosage Given: 2 Gy
Session Number: 38

## 2021-09-07 ENCOUNTER — Other Ambulatory Visit: Payer: Self-pay

## 2021-09-07 ENCOUNTER — Ambulatory Visit
Admission: RE | Admit: 2021-09-07 | Discharge: 2021-09-07 | Disposition: A | Discharge status: Home / Self Care | Payer: Medicare HMO | Source: Ambulatory Visit | Attending: Radiation Oncology | Admitting: Radiation Oncology

## 2021-09-07 ENCOUNTER — Ambulatory Visit: Payer: Medicare HMO

## 2021-09-07 DIAGNOSIS — C61 Malignant neoplasm of prostate: Secondary | ICD-10-CM | POA: Diagnosis not present

## 2021-09-07 DIAGNOSIS — Z510 Encounter for antineoplastic radiation therapy: Secondary | ICD-10-CM | POA: Diagnosis not present

## 2021-09-07 DIAGNOSIS — Z191 Hormone sensitive malignancy status: Secondary | ICD-10-CM | POA: Diagnosis not present

## 2021-09-07 LAB — RAD ONC ARIA SESSION SUMMARY
Course Elapsed Days: 71
Plan Fractions Treated to Date: 14
Plan Prescribed Dose Per Fraction: 2 Gy
Plan Total Fractions Prescribed: 15
Plan Total Prescribed Dose: 30 Gy
Reference Point Dosage Given to Date: 73 Gy
Reference Point Session Dosage Given: 2 Gy
Session Number: 39

## 2021-09-10 ENCOUNTER — Other Ambulatory Visit: Payer: Self-pay

## 2021-09-10 ENCOUNTER — Ambulatory Visit
Admission: RE | Admit: 2021-09-10 | Discharge: 2021-09-10 | Disposition: A | Discharge status: Home / Self Care | Payer: Medicare HMO | Source: Ambulatory Visit | Attending: Radiation Oncology | Admitting: Radiation Oncology

## 2021-09-10 DIAGNOSIS — Z510 Encounter for antineoplastic radiation therapy: Secondary | ICD-10-CM | POA: Diagnosis not present

## 2021-09-10 DIAGNOSIS — C61 Malignant neoplasm of prostate: Secondary | ICD-10-CM | POA: Diagnosis not present

## 2021-09-10 DIAGNOSIS — Z191 Hormone sensitive malignancy status: Secondary | ICD-10-CM | POA: Diagnosis not present

## 2021-09-10 LAB — RAD ONC ARIA SESSION SUMMARY
Course Elapsed Days: 74
Plan Fractions Treated to Date: 15
Plan Prescribed Dose Per Fraction: 2 Gy
Plan Total Fractions Prescribed: 15
Plan Total Prescribed Dose: 30 Gy
Reference Point Dosage Given to Date: 75 Gy
Reference Point Session Dosage Given: 2 Gy
Session Number: 40

## 2021-10-11 ENCOUNTER — Emergency Department (HOSPITAL_COMMUNITY): Payer: Medicare HMO

## 2021-10-11 ENCOUNTER — Encounter (HOSPITAL_COMMUNITY): Payer: Self-pay | Admitting: Emergency Medicine

## 2021-10-11 ENCOUNTER — Emergency Department (HOSPITAL_COMMUNITY)
Admission: EM | Admit: 2021-10-11 | Discharge: 2021-10-11 | Disposition: A | Discharge status: Home / Self Care | Payer: Medicare HMO | Attending: Emergency Medicine | Admitting: Emergency Medicine

## 2021-10-11 DIAGNOSIS — U071 COVID-19: Secondary | ICD-10-CM | POA: Diagnosis not present

## 2021-10-11 DIAGNOSIS — J45909 Unspecified asthma, uncomplicated: Secondary | ICD-10-CM | POA: Insufficient documentation

## 2021-10-11 DIAGNOSIS — R0789 Other chest pain: Secondary | ICD-10-CM | POA: Diagnosis not present

## 2021-10-11 DIAGNOSIS — I10 Essential (primary) hypertension: Secondary | ICD-10-CM | POA: Insufficient documentation

## 2021-10-11 DIAGNOSIS — Z20822 Contact with and (suspected) exposure to covid-19: Secondary | ICD-10-CM | POA: Insufficient documentation

## 2021-10-11 DIAGNOSIS — R509 Fever, unspecified: Secondary | ICD-10-CM | POA: Diagnosis not present

## 2021-10-11 DIAGNOSIS — Z96652 Presence of left artificial knee joint: Secondary | ICD-10-CM | POA: Insufficient documentation

## 2021-10-11 DIAGNOSIS — R079 Chest pain, unspecified: Secondary | ICD-10-CM | POA: Diagnosis not present

## 2021-10-11 LAB — CBC
HCT: 38.2 % — ABNORMAL LOW (ref 39.0–52.0)
Hemoglobin: 12.7 g/dL — ABNORMAL LOW (ref 13.0–17.0)
MCH: 28.4 pg (ref 26.0–34.0)
MCHC: 33.2 g/dL (ref 30.0–36.0)
MCV: 85.5 fL (ref 80.0–100.0)
Platelets: 235 10*3/uL (ref 150–400)
RBC: 4.47 MIL/uL (ref 4.22–5.81)
RDW: 14.8 % (ref 11.5–15.5)
WBC: 6 10*3/uL (ref 4.0–10.5)
nRBC: 0 % (ref 0.0–0.2)

## 2021-10-11 LAB — SARS CORONAVIRUS 2 BY RT PCR: SARS Coronavirus 2 by RT PCR: POSITIVE — AB

## 2021-10-11 LAB — BASIC METABOLIC PANEL
Anion gap: 9 (ref 5–15)
BUN: 16 mg/dL (ref 8–23)
CO2: 26 mmol/L (ref 22–32)
Calcium: 9.9 mg/dL (ref 8.9–10.3)
Chloride: 101 mmol/L (ref 98–111)
Creatinine, Ser: 1.17 mg/dL (ref 0.61–1.24)
GFR, Estimated: >60 mL/min (ref >60)
Glucose, Bld: 98 mg/dL (ref 70–99)
Potassium: 3.6 mmol/L (ref 3.5–5.1)
Sodium: 136 mmol/L (ref 135–145)

## 2021-10-11 LAB — TROPONIN I (HIGH SENSITIVITY): Troponin I (High Sensitivity): 9 ng/L (ref <18)

## 2021-10-11 MED ORDER — NIRMATRELVIR/RITONAVIR (PAXLOVID)TABLET: 3.0000 | ORAL_TABLET | Freq: Two times a day (BID) | ORAL | 0 refills | Status: AC

## 2021-10-11 NOTE — Discharge Instructions (Signed)
We evaluated you in the emergency department for your symptoms.  Your COVID test was positive.  Please self isolate until your symptoms improve.  Please try to drink a lot of fluids and continue to eat.  We have prescribed you a medicine called Paxlovid.  This medicine you take twice a day for 5 days.  This medicine can help your symptoms resolve sooner and has been shown to help reduce the need to stay in the hospital.  Please take Tylenol 650 mg every 6 hours as needed for fevers or pain.  Please return to the emergency department if you develop any new or worsening symptoms such as difficulty breathing, confusion, vomiting, inability to take fluids, productive cough, or any other new symptoms.

## 2021-10-11 NOTE — ED Provider Triage Note (Addendum)
Emergency Medicine Provider Triage Evaluation Note  [name] , a 81 y.o. male  was evaluated in triage.  Pt complains of chest pain and fever. Chest pain started 2 weeks ago. Located in center of chest. Non radiating and non reproducible. No SOB. Laying down. CP is constant throughout the day. Endorses cough and runny nose. Chest pain worse with coughing. Fever also for weeks. Per son, he has not had a fever. He has been checking with thermometer. Endorses poor appetite.  Endorsing urinary incontinence for two weeks.    Review of Systems  Positive: See above Negative: See above  Physical Exam  BP 125/71 (BP Location: Left Arm)   Pulse 85   Temp (!) 101.1 F (38.4 C) (Oral)   Resp 16   SpO2 94%  Gen:   Awake, no distress   Resp:  Normal effort  MSK:   Moves extremities without difficulty  Other:  Fever  Medical Decision Making  Medically screening exam initiated at 3:54 PM.  Appropriate orders placed.  [name] was informed that the remainder of the evaluation will be completed by another provider, this initial triage assessment does not replace that evaluation, and the importance of remaining in the ED until their evaluation is complete.  Plan: ekg, trop, basic labs, cxr, UA   Harriet Pho, PA-C 10/11/21 1600    Harriet Pho, PA-C 10/11/21 1608

## 2021-10-11 NOTE — ED Provider Notes (Signed)
[name] Provider Note  CSN: [id number] Arrival date & time: 10/11/21 1519  Chief Complaint(s) Chest Pain and Fever  HPI [name] is a 81 y.o. male presenting to the emergency department with cough.  Patient reports associated subjective fevers and chills, runny nose and sore throat.  He reports some chest pain with coughing but otherwise denies chest pain.  Son at bedside reports that the symptoms began around Monday.  The patient stated that it was for 2 to 3 weeks but the son reports that this is incorrect.  No nausea, vomiting, diarrhea, abdominal pain.  He has had decreased appetite although the son is encouraging him to eat and drink.  The chest pain is not pleuritic or exertional.  No shortness of breath.  No diaphoresis.   Past Medical History Past Medical History:  Diagnosis Date   Benign localized prostatic hyperplasia with lower urinary tract symptoms (LUTS)    Chronic gout    followed by pcp   Full dentures    History of colitis 2005   per colonoscopy chronic inflammation colits secondary to ascending colon ulceration   History of diverticulitis of colon    History of DVT of lower extremity 09/13/2006   LLE   History of pulmonary embolism 09/13/2006   bilateral PE secondary to DVT of LLE in 2008 On Warfarin until 01/2010 when he was admitted for syncope and noticed that he had completed course of warfarin and it was stopped.   HTN (hypertension)    followed by pcp   (05-12-2023no medication since 01/ 2023 by pt's pcp)   Hx of acute blood loss anemia due to radiation proctitis, hg normalized after transfusion 07/16/2021   Hx of aortic root dilation ([place]), resolved 01/10/2012   2012: Left ventricle: no LVH.  EF 60% to 65%.  Aorta: Mildly dilated aortic root. Aortic root dimension: 58m  2015 : Aortic root: The aortic root was top normal in size.  2015 : Stable diffuse thoracic aortic ectasia with maximum diameter of the ascending  aorta to 4.6 cm.  2017 ECHO ascending aorta nl in size     Hyperlipidemia    [place]   urologist-- dr bell;  dx 01/ 2023, Gleason 4+4   Mild intermittent asthma    PFT's 05/2012 : FEV1 / FVC 75 and 78 pre and post BD. FEV1 74 and 75 pre and post BD. TLC 72. RV 93. DLCO 79. Read as restrictive lung disease, possible with obstructive lung disease. Prior smoker.     Mild intermittent asthma, uncomplicated [id number]  PFT's 05/2012 : FEV1 / FVC 75 and 78 pre and post BD. FEV1 74 and 75 pre and post BD. TLC 72. RV 93. DLCO 79. Read as restrictive lung disease, possible with obstructive lung disease.  Prior smoker.    OA (osteoarthritis) of knee 11/05/2013   Radiation proctitis 07/28/2021   Marked showed marked rectal thickening and perirectal stranding, findings compatible with radiation proctitis. His blood and urine culture showed no growth. He received Vanco, cefepime  and Flagyl during hospitalization which was changed to Rocephin, and Flagyl. He was discharged on Omnicef and Flagyl to cover proctitis for 10 days for a total 14 days.    Syncope 07/16/2021   Patient Active Problem List   Diagnosis Date Noted   Tinea pedis of both feet 08/30/2021   Mild atopic dermatitis of anterior LE's, provisional dx 08/30/2021   Low-level of literacy 08/30/2021   Mild  cognitive impairment of uncertain or unknown etiology 08/30/2021   History of venous thromboembolism, LLE with PE, completed anticoagulation 08/29/2021   Aortic atherosclerosis ([place]) 07/16/2021   Malignant neoplasm of prostate ([place]) treated with radioactive seed implants (and XRT?) 02/15/2021   Decreased hearing of both ears 06/01/2020   Gout, chronic, without tophus 10/12/2015   Counseling regarding end of life decision making 04/13/2015   Hx of BPH with lower urinary tract symptoms (LUTS); s/p TURP 08/04/2013   Healthcare maintenance 11/26/2012   Glaucoma 06/12/2012   Hypertension, currently not requiring  medication. 12/12/2005   Home Medication(s) Prior to Admission medications   Medication Sig Start Date End Date Taking? Authorizing Provider  nirmatrelvir/ritonavir EUA (PAXLOVID) 20 x 150 MG & 10 x '100MG'$  TABS Take 3 tablets by mouth 2 (two) times daily for 5 days. Take nirmatrelvir (150 mg) two tablets twice daily for 5 days and ritonavir (100 mg) one tablet twice daily for 5 days. 10/11/21 10/16/21 Yes Cristie Hem, MD  latanoprost (XALATAN) 0.005 % ophthalmic solution 1 drop at bedtime. Patient not taking: Reported on 03/13/2021 12/14/18   [provider]  tamsulosin (FLOMAX) 0.4 MG CAPS capsule Take 0.4 mg by mouth daily. 08/07/21   [provider]  terbinafine (LAMISIL) 250 MG tablet Take 1 tablet (250 mg total) by mouth daily. 08/30/21   Angelica Pou, MD                                                                                                                                    Past Surgical History Past Surgical History:  Procedure Laterality Date   GOLD SEED IMPLANT N/A 06/13/2021   Procedure: GOLD SEED IMPLANT;  Surgeon: Lucas Mallow, MD;  Location: [facility];  Service: Urology;  Laterality: N/A;   I & D EXTREMITY Right 03/04/2014   Procedure: IRRIGATION AND DEBRIDEMENT FINGER / HAND;  Surgeon: Dayna Barker, MD;  Location: [facility];  Service: Plastics;  Laterality: Right;  I&D Right Small Finger and Right Wrist   KNEE ARTHROSCOPY Left 2000   SPACE OAR INSTILLATION N/A 06/13/2021   Procedure: SPACE OAR INSTILLATION;  Surgeon: Lucas Mallow, MD;  [facility];  Service: Urology;  Laterality: N/A;   TRANSURETHRAL RESECTION OF PROSTATE  04/01/2003   '@[facility]'$   by dr [name]   Family History Family History  Problem Relation Age of Onset   Heart disease Father    Stroke Father    Heart disease Brother 70    Social History Social History   Tobacco Use   Smoking status: Never   Smokeless tobacco: Never   Vaping Use   Vaping Use: Never used  Substance Use Topics   Alcohol use: No   Drug use: Never   Allergies Patient has no known allergies.  Review of Systems Review of Systems  All other systems reviewed and are negative.  Physical Exam Vital Signs  I have reviewed the triage vital signs BP 135/78   Pulse 77   Temp 98.6 F (37 C) (Oral)   Resp 17   SpO2 99%  Physical Exam Vitals and nursing note reviewed.  Constitutional:      General: He is not in acute distress.    Appearance: Normal appearance.  HENT:     Mouth/Throat:     Mouth: Mucous membranes are moist.  Eyes:     Conjunctiva/sclera: Conjunctivae normal.  Cardiovascular:     Rate and Rhythm: Normal rate and regular rhythm.  Pulmonary:     Effort: Pulmonary effort is normal. No respiratory distress.     Breath sounds: Normal breath sounds.  Abdominal:     General: Abdomen is flat.     Palpations: Abdomen is soft.     Tenderness: There is no abdominal tenderness.  Musculoskeletal:     Right lower leg: No edema.     Left lower leg: No edema.  Skin:    General: Skin is warm and dry.     Capillary Refill: Capillary refill takes less than 2 seconds.  Neurological:     Mental Status: He is alert and oriented to person, place, and time. Mental status is at baseline.  Psychiatric:        Mood and Affect: Mood normal.        Behavior: Behavior normal.     ED Results and Treatments Labs (all labs ordered are listed, but only abnormal results are displayed) Labs Reviewed  SARS CORONAVIRUS 2 BY RT PCR - Abnormal; Notable for the following components:      Result Value   SARS Coronavirus 2 by RT PCR POSITIVE (*)    All other components within normal limits  CBC - Abnormal; Notable for the following components:   Hemoglobin 12.7 (*)    HCT 38.2 (*)    All other components within normal limits  BASIC METABOLIC PANEL  TROPONIN I (HIGH SENSITIVITY)                                                                                                                           Radiology DG Chest 2 View  Result Date: 10/11/2021 CLINICAL DATA:  Chest pain, fever EXAM: CHEST - 2 VIEW COMPARISON:  07/16/2021 FINDINGS: Cardiac size is within normal limits. There is poor inspiration. Crowding of markings in the lower lung fields may be due to poor inspiration or suggest subsegmental atelectasis. There is no focal pulmonary consolidation. There is no pleural effusion or pneumothorax. IMPRESSION: Linear densities in the lower lung fields suggest crowding of normal bronchovascular structures due to poor inspiration or subsegmental atelectasis. Electronically Signed   By: Elmer Picker M.D.   On: 10/11/2021 16:35    Pertinent labs & imaging results that were available during my care of the patient were reviewed by me and considered in my medical decision making (see MDM for details).  Medications Ordered  in ED Medications - No data to display                                                                                                                                   Procedures Procedures  (including critical care time)  Medical Decision Making / ED Course   MDM:  81 year old male presenting to the emergency department with upper respiratory tract symptoms.  Patient overall well-appearing, physical exam reassuring with no focal abnormality.  Initially febrile which improved without any intervention  COVID test positive, suspect that this is the cause of the patient's symptoms given he primarily has upper respiratory tract and symptoms and his chest pain is in the setting of cough.  Symptoms atypical for ACS, ECG shows LVH with strain pattern but troponin negative.  Doubt PE with no hypoxia, tachycardia or pleuritic pain.  Chest x-ray does not show any evidence of pneumonia.  Will prescribe Paxlovid. Will discharge patient to home. All questions answered. Patient comfortable with plan of discharge.  Return precautions discussed with patient and specified on the after visit summary.       Additional history obtained: -Additional history obtained from family -External records from outside source obtained and reviewed including: Chart review including previous notes, labs, imaging, consultation notes   Lab Tests: -I ordered, reviewed, and interpreted labs.   The pertinent results include:   Labs Reviewed  SARS CORONAVIRUS 2 BY RT PCR - Abnormal; Notable for the following components:      Result Value   SARS Coronavirus 2 by RT PCR POSITIVE (*)    All other components within normal limits  CBC - Abnormal; Notable for the following components:   Hemoglobin 12.7 (*)    HCT 38.2 (*)    All other components within normal limits  BASIC METABOLIC PANEL  TROPONIN I (HIGH SENSITIVITY)      EKG   EKG Interpretation  Date/Time:  Thursday October 11 2021 15:36:37 EDT Ventricular Rate:  83 PR Interval:  188 QRS Duration: 97 QT Interval:  348 QTC Calculation: 409 R Axis:   -27 Text Interpretation: Sinus rhythm LVH with secondary repolarization abnormality Probable anterior infarct, age indeterminate Confirmed by Garnette Gunner [phone]) on 10/11/2021 8:51:56 PM         Imaging Studies ordered: I ordered imaging studies including CXR On my interpretation imaging demonstrates clear lungs I independently visualized and interpreted imaging. I agree with the radiologist interpretation   Medicines ordered and prescription drug management: Meds ordered this encounter  Medications   nirmatrelvir/ritonavir EUA (PAXLOVID) 20 x 150 MG & 10 x '100MG'$  TABS    Sig: Take 3 tablets by mouth 2 (two) times daily for 5 days. Take nirmatrelvir (150 mg) two tablets twice daily for 5 days and ritonavir (100 mg) one tablet twice daily for 5 days.    Dispense:  30 tablet    Refill:  0  Reevaluation: After the interventions noted above, I reevaluated the patient and found that they have  improved  Co morbidities that complicate the patient evaluation  Past Medical History:  Diagnosis Date   Benign localized prostatic hyperplasia with lower urinary tract symptoms (LUTS)    Chronic gout    followed by pcp   Full dentures    History of colitis 2005   per colonoscopy chronic inflammation colits secondary to ascending colon ulceration   History of diverticulitis of colon    History of DVT of lower extremity 09/13/2006   LLE   History of pulmonary embolism 09/13/2006   bilateral PE secondary to DVT of LLE in 2008 On Warfarin until 01/2010 when he was admitted for syncope and noticed that he had completed course of warfarin and it was stopped.   HTN (hypertension)    followed by pcp   (05-12-2023no medication since 01/ 2023 by pt's pcp)   Hx of acute blood loss anemia due to radiation proctitis, hg normalized after transfusion 07/16/2021   Hx of aortic root dilation ([place]), resolved 01/10/2012   2012: Left ventricle: no LVH.  EF 60% to 65%.  Aorta: Mildly dilated aortic root. Aortic root dimension: 36m  2015 : Aortic root: The aortic root was top normal in size.  2015 : Stable diffuse thoracic aortic ectasia with maximum diameter of the ascending aorta to 4.6 cm.  2017 ECHO ascending aorta nl in size     Hyperlipidemia    [place]   urologist-- dr bell;  dx 01/ 2023, Gleason 4+4   Mild intermittent asthma    PFT's 05/2012 : FEV1 / FVC 75 and 78 pre and post BD. FEV1 74 and 75 pre and post BD. TLC 72. RV 93. DLCO 79. Read as restrictive lung disease, possible with obstructive lung disease. Prior smoker.     Mild intermittent asthma, uncomplicated [id number]  PFT's 05/2012 : FEV1 / FVC 75 and 78 pre and post BD. FEV1 74 and 75 pre and post BD. TLC 72. RV 93. DLCO 79. Read as restrictive lung disease, possible with obstructive lung disease.  Prior smoker.    OA (osteoarthritis) of knee 11/05/2013   Radiation proctitis 07/28/2021   Marked showed  marked rectal thickening and perirectal stranding, findings compatible with radiation proctitis. His blood and urine culture showed no growth. He received Vanco, cefepime  and Flagyl during hospitalization which was changed to Rocephin, and Flagyl. He was discharged on Omnicef and Flagyl to cover proctitis for 10 days for a total 14 days.    Syncope 07/16/2021      Dispostion: Discharge    Final Clinical Impression(s) / ED Diagnoses Final diagnoses:  COVID-19     This chart was dictated using voice recognition software.  Despite best efforts to proofread,  errors can occur which can change the documentation meaning.    SCristie Hem MD 10/11/21 2108

## 2021-10-11 NOTE — ED Triage Notes (Signed)
Pt here from home with family with c/o weakness fever chest pain and frequent urination , grnadkids have been sick around him

## 2021-10-15 ENCOUNTER — Telehealth: Payer: Self-pay

## 2021-10-15 NOTE — Patient Outreach (Signed)
  Care Coordination TOC Note Transition Care Management Follow-up Telephone Call Date of discharge and from where: [facility] 10/11/21 ED visit How have you been since you were released from the hospital? Per patients daughter in law, he is feeling better from his Covid". Any questions or concerns? No  Items Reviewed: Did the pt receive and understand the discharge instructions provided? Yes  Medications obtained and verified? Yes  Other? No  Any new allergies since your discharge? No  Dietary orders reviewed? Yes Do you have support at home? Yes   Home Care and Equipment/Supplies: Were home health services ordered? no If so, what is the name of the agency? N/A  Has the agency set up a time to come to the patient's home? no Were any new equipment or medical supplies ordered?  No What is the name of the medical supply agency? N/A Were you able to get the supplies/equipment? not applicable Do you have any questions related to the use of the equipment or supplies? No  Functional Questionnaire: (I = Independent and D = Dependent) ADLs: D  Bathing/Dressing- D  Meal Prep- D  Eating- I  Maintaining continence- I  Transferring/Ambulation- D  Managing Meds- D  Follow up appointments reviewed:  PCP Hospital f/u appt confirmed? No   Specialist Hospital f/u appt confirmed? No   Are transportation arrangements needed? No  If their condition worsens, is the pt aware to call PCP or go to the Emergency Dept.? Yes Was the patient provided with contact information for the PCP's office or ED? Yes Was to pt encouraged to call back with questions or concerns? Yes  SDOH assessments and interventions completed:   Yes  Care Coordination Interventions Activated:  Yes   Care Coordination Interventions:  PCP follow up appointment requested   Encounter Outcome:  Pt. Visit Completed

## 2021-10-22 ENCOUNTER — Telehealth: Payer: Self-pay

## 2021-10-22 NOTE — Telephone Encounter (Signed)
     Patient  visit on 10/11/2021  at [facility] was for Covid-19.  Have you been able to follow up with your primary care physician? Yes  The patient was or was not able to obtain any needed medicine or equipment. Patient has been able to obtain medication.  Are there diet recommendations that you are having difficulty following? No  Patient expresses understanding of discharge instructions and education provided has no other needs at this time.    [place] Resource Care Guide   [email or web address]  ?? [phone]   Website: [email or web address]  [email or web address]  "We don't say no, we SHOW how!"         [facility]

## 2021-10-30 ENCOUNTER — Encounter: Payer: Self-pay | Admitting: Urology

## 2021-10-30 DIAGNOSIS — C61 Malignant neoplasm of prostate: Secondary | ICD-10-CM

## 2021-10-30 NOTE — Progress Notes (Signed)
Telephone appointment. I spoke w/ patient's son, [name] (with limited knowledge), he reports that patient is doing well and has no complaints at this time.  Meaningful use complete. I-PSS score of 1-mild. Flomax as directed. Urology appt- Oct 18th, 2023.  Patient aware of his 9:00am-10/31/21 telephone appointment w/ Ashlyn Bruning PA-C. I left my extension [phone] in case patient needs anything. Patient's son verbalized understanding.  Patient contact [phone]

## 2021-10-30 NOTE — Progress Notes (Signed)
Radiation Oncology         [phone] ________________________________  Name: [name] MRN: [id number]  Date: 10/31/2021  DOB: 10/19/1940  Post Treatment Note  CC: [name] Pou, MD  [name] Mallow, MD  Diagnosis:    81 y.o. gentleman with Stage T2a adenocarcinoma of the prostate with Gleason score of 4+4, and PSA of 25.4.   Interval Since Last Radiation:  3 weeks   06/28/21 - 09/10/21: 1. The prostate, seminal vesicles, and pelvic lymph nodes were initially treated to 45 Gy in 25 fractions of 1.8 Gy  2. The prostate only was boosted to 75 Gy with 15 additional fractions of 2.0 Gy   Narrative:  I spoke with the patient to conduct his routine scheduled 1 month follow up visit via telephone to spare the patient unnecessary potential exposure in the healthcare setting during the current COVID-19 pandemic.  The patient was notified in advance and gave permission to proceed with this visit format.  He tolerated radiation treatment relatively well with only minor urinary irritation and modest fatigue.  He did experience increased nocturia 4-5x/night and occasional diarrhea that was managed with Imodium prn. He also had an episode of proctitis 07/16/21 with fever/chills and syncope for which he was hospitalized for 2 days and then discharged home on antibiotics.                               On review of systems, the patient states that he is doing very well in general.  He reports that his LUTS are gradually improving and almost back to his baseline at this point.  He does continue with some mild increased frequency but is no longer taking the Flomax daily.  He specifically denies dysuria, gross hematuria, straining to void, incomplete bladder emptying or incontinence.  He will occasionally have some postvoid dribble but this was present even prior to starting radiation.  He continues to have some difficulties with constipation but feels that he has fully recovered from the proctitis  and is doing well aside from recently getting over [place].  His energy level continues to gradually improve and he has been able to remain active.  His PSA has responded well to treatment with a decreased to 0.035 in 07/2021. He continues to tolerate the ADT fairly well and had a 3 month Eligard injection 08/07/21.  Overall, he is pleased with his progress to date.  ALLERGIES:  has No Known Allergies.  Meds: Current Outpatient Medications  Medication Sig Dispense Refill   latanoprost (XALATAN) 0.005 % ophthalmic solution 1 drop at bedtime. (Patient not taking: Reported on 03/13/2021)     tamsulosin (FLOMAX) 0.4 MG CAPS capsule Take 0.4 mg by mouth daily.     terbinafine (LAMISIL) 250 MG tablet Take 1 tablet (250 mg total) by mouth daily. 30 tablet 0   No current facility-administered medications for this encounter.    Physical Findings:  vitals were not taken for this visit.  Pain Assessment Pain Score: 0-No pain/10 Unable to assess due to telephone follow up visit format.  Lab Findings: Lab Results  Component Value Date   WBC 6.0 10/11/2021   HGB 12.7 (L) 10/11/2021   HCT 38.2 (L) 10/11/2021   MCV 85.5 10/11/2021   PLT 235 10/11/2021     Radiographic Findings: DG Chest 2 View  Result Date: 10/11/2021 CLINICAL DATA:  Chest pain, fever EXAM: CHEST - 2 VIEW COMPARISON:  07/16/2021 FINDINGS: Cardiac size is within normal limits. There is poor inspiration. Crowding of markings in the lower lung fields may be due to poor inspiration or suggest subsegmental atelectasis. There is no focal pulmonary consolidation. There is no pleural effusion or pneumothorax. IMPRESSION: Linear densities in the lower lung fields suggest crowding of normal bronchovascular structures due to poor inspiration or subsegmental atelectasis. Electronically Signed   By: [name] M.D.   On: 10/11/2021 16:35    Impression/Plan: 1.  81 y.o. gentleman with Stage T2a adenocarcinoma of the prostate with Gleason  score of 4+4, and PSA of 25.4.  He will continue to follow up with urology for ongoing PSA determinations and has an appointment scheduled for labs on 11/06/21, prior to his visit with Dr. Gloriann [name] the following week. He understands what to expect with regards to PSA monitoring going forward. I will look forward to following his response to treatment via correspondence with urology, and would be happy to continue to participate in his care if clinically indicated. I talked to the patient about what to expect in the future, including his risk for erectile dysfunction and rectal bleeding. I encouraged him to call or return to the office if he has any questions regarding his previous radiation or possible radiation side effects. He was comfortable with this plan and will follow up as needed.     [name] Johns, PA-C

## 2021-10-30 NOTE — Progress Notes (Signed)
  Radiation Oncology         [phone] ________________________________  Name: [name] MRN: [id number]  Date: 10/30/2021  DOB: 10-Jan-1941  End of Treatment Note  Diagnosis:    81y.o. gentleman with Stage T2a adenocarcinoma of the prostate with Gleason score of 4+4, and PSA of 25.4.      Indication for treatment:  Curative, Definitive Radiotherapy; concurrent with LT-ADT       Radiation treatment dates:   06/28/21 - 09/10/21  Site/dose:  1. The prostate, seminal vesicles, and pelvic lymph nodes were initially treated to 45 Gy in 25 fractions of 1.8 Gy  2. The prostate only was boosted to 75 Gy with 15 additional fractions of 2.0 Gy   Beams/energy:  1. The prostate, seminal vesicles, and pelvic lymph nodes were initially treated using VMAT intensity modulated radiotherapy delivering 6 megavolt photons. Image guidance was performed with CB-CT studies prior to each fraction. He was immobilized with a body fix lower extremity mold.  2. the prostate only was boosted using VMAT intensity modulated radiotherapy delivering 6 megavolt photons. Image guidance was performed with CB-CT studies prior to each fraction. He was immobilized with a body fix lower extremity mold.  Narrative: The patient tolerated radiation treatment relatively well with only minor urinary irritation and modest fatigue.  He did experience increased nocturia 4-5x/night and occasional diarrhea that was managed with Imodium prn. He also had an episode of proctitis 07/16/21 with fever/chills and syncope for which he was hospitalized for 2 days and then discharged home on antibiotics.  Plan: The patient has completed radiation treatment. He will return to radiation oncology clinic for routine followup in one month. I advised him to call or return sooner if he has any questions or concerns related to his recovery or treatment. ________________________________  Sheral Apley. Tammi Klippel, M.D.

## 2021-10-31 ENCOUNTER — Ambulatory Visit
Admission: RE | Admit: 2021-10-31 | Discharge: 2021-10-31 | Disposition: A | Discharge status: Home / Self Care | Payer: Medicare HMO | Source: Ambulatory Visit | Attending: Urology | Admitting: Urology

## 2021-10-31 DIAGNOSIS — C61 Malignant neoplasm of prostate: Secondary | ICD-10-CM

## 2021-11-02 ENCOUNTER — Encounter: Payer: Self-pay | Admitting: Internal Medicine

## 2021-11-06 DIAGNOSIS — C61 Malignant neoplasm of prostate: Secondary | ICD-10-CM | POA: Diagnosis not present

## 2021-11-09 ENCOUNTER — Encounter: Payer: Self-pay | Admitting: *Deleted

## 2021-11-14 DIAGNOSIS — R3912 Poor urinary stream: Secondary | ICD-10-CM | POA: Diagnosis not present

## 2021-11-14 DIAGNOSIS — C61 Malignant neoplasm of prostate: Secondary | ICD-10-CM | POA: Diagnosis not present

## 2021-11-14 DIAGNOSIS — R300 Dysuria: Secondary | ICD-10-CM | POA: Diagnosis not present

## 2021-11-14 DIAGNOSIS — R351 Nocturia: Secondary | ICD-10-CM | POA: Diagnosis not present

## 2022-02-11 ENCOUNTER — Encounter: Payer: Self-pay | Admitting: *Deleted

## 2022-02-18 ENCOUNTER — Inpatient Hospital Stay: Payer: Medicare HMO | Attending: Adult Health | Admitting: *Deleted

## 2022-02-18 ENCOUNTER — Encounter: Payer: Self-pay | Admitting: *Deleted

## 2022-02-18 DIAGNOSIS — C61 Malignant neoplasm of prostate: Secondary | ICD-10-CM

## 2022-02-18 NOTE — Progress Notes (Signed)
2 Identifiers used for verification purposes for this appt. No vital signs were taken as this was a telephone visit. Allergies and medications reviewed and updated. Pt denies pain today. Pt has very little fatigue after Eligard injections but resolves fairly quickly. He says he has mild hotflashes. Pt states he only gets up to use bathroom at night twice and has good daytime control getting to bathroom in time. He says he was bothered by occasional diarrhea after radiation but has since resolved. Pt states urine stream is getting better. Last Eligard was in October and most recent PSA was <0.015. He receives injections every 3 months. Pt does not have a regimen of exercise due to chronic knee pain. We discussed diet as well, the Nutrition Rainbow was given and discussed with patient. Pt has flu vaccine last week and will get his Covid booster today. Pt denies smoking and drinking.  Overall pt says he feels good and has no complaints. SCP reviewed and completed.

## 2022-02-21 ENCOUNTER — Encounter: Payer: Self-pay | Admitting: Internal Medicine

## 2022-02-25 ENCOUNTER — Other Ambulatory Visit: Payer: Self-pay | Admitting: Internal Medicine

## 2022-02-27 ENCOUNTER — Other Ambulatory Visit: Payer: Self-pay | Admitting: Internal Medicine

## 2022-02-27 DIAGNOSIS — H9193 Unspecified hearing loss, bilateral: Secondary | ICD-10-CM

## 2022-03-01 DIAGNOSIS — C61 Malignant neoplasm of prostate: Secondary | ICD-10-CM | POA: Diagnosis not present

## 2022-03-01 DIAGNOSIS — R351 Nocturia: Secondary | ICD-10-CM | POA: Diagnosis not present

## 2022-05-27 DIAGNOSIS — C61 Malignant neoplasm of prostate: Secondary | ICD-10-CM | POA: Diagnosis not present

## 2022-06-03 DIAGNOSIS — C61 Malignant neoplasm of prostate: Secondary | ICD-10-CM | POA: Diagnosis not present

## 2022-06-03 DIAGNOSIS — R351 Nocturia: Secondary | ICD-10-CM | POA: Diagnosis not present

## 2022-06-18 DIAGNOSIS — H903 Sensorineural hearing loss, bilateral: Secondary | ICD-10-CM | POA: Diagnosis not present

## 2022-07-30 DIAGNOSIS — H903 Sensorineural hearing loss, bilateral: Secondary | ICD-10-CM | POA: Diagnosis not present

## 2022-08-20 ENCOUNTER — Emergency Department (HOSPITAL_COMMUNITY)
Admission: EM | Admit: 2022-08-20 | Discharge: 2022-08-20 | Disposition: A | Discharge status: Home / Self Care | Payer: Medicare HMO | Attending: Emergency Medicine | Admitting: Emergency Medicine

## 2022-08-20 ENCOUNTER — Emergency Department (HOSPITAL_COMMUNITY): Payer: Medicare HMO

## 2022-08-20 ENCOUNTER — Other Ambulatory Visit: Payer: Self-pay

## 2022-08-20 DIAGNOSIS — N3090 Cystitis, unspecified without hematuria: Secondary | ICD-10-CM | POA: Diagnosis not present

## 2022-08-20 DIAGNOSIS — Z8546 Personal history of malignant neoplasm of prostate: Secondary | ICD-10-CM | POA: Diagnosis not present

## 2022-08-20 DIAGNOSIS — N281 Cyst of kidney, acquired: Secondary | ICD-10-CM | POA: Diagnosis not present

## 2022-08-20 DIAGNOSIS — N3289 Other specified disorders of bladder: Secondary | ICD-10-CM | POA: Diagnosis not present

## 2022-08-20 DIAGNOSIS — J45909 Unspecified asthma, uncomplicated: Secondary | ICD-10-CM | POA: Insufficient documentation

## 2022-08-20 DIAGNOSIS — I10 Essential (primary) hypertension: Secondary | ICD-10-CM | POA: Diagnosis not present

## 2022-08-20 DIAGNOSIS — R319 Hematuria, unspecified: Secondary | ICD-10-CM | POA: Diagnosis present

## 2022-08-20 DIAGNOSIS — R109 Unspecified abdominal pain: Secondary | ICD-10-CM | POA: Diagnosis not present

## 2022-08-20 DIAGNOSIS — K5730 Diverticulosis of large intestine without perforation or abscess without bleeding: Secondary | ICD-10-CM | POA: Diagnosis not present

## 2022-08-20 LAB — URINALYSIS, ROUTINE W REFLEX MICROSCOPIC
Bilirubin Urine: NEGATIVE
Glucose, UA: NEGATIVE mg/dL
Ketones, ur: NEGATIVE mg/dL
Leukocytes,Ua: NEGATIVE
Nitrite: NEGATIVE
Protein, ur: 100 mg/dL — AB
RBC / HPF: >50 RBC/hpf (ref 0–5)
Specific Gravity, Urine: 1.017 (ref 1.005–1.030)
pH: 5 (ref 5.0–8.0)

## 2022-08-20 LAB — BASIC METABOLIC PANEL
Anion gap: 8 (ref 5–15)
BUN: 19 mg/dL (ref 8–23)
CO2: 26 mmol/L (ref 22–32)
Calcium: 9 mg/dL (ref 8.9–10.3)
Chloride: 104 mmol/L (ref 98–111)
Creatinine, Ser: 1.05 mg/dL (ref 0.61–1.24)
GFR, Estimated: >60 mL/min (ref >60)
Glucose, Bld: 81 mg/dL (ref 70–99)
Potassium: 3.3 mmol/L — ABNORMAL LOW (ref 3.5–5.1)
Sodium: 138 mmol/L (ref 135–145)

## 2022-08-20 LAB — CBC
HCT: 34.2 % — ABNORMAL LOW (ref 39.0–52.0)
Hemoglobin: 11.1 g/dL — ABNORMAL LOW (ref 13.0–17.0)
MCH: 27 pg (ref 26.0–34.0)
MCHC: 32.5 g/dL (ref 30.0–36.0)
MCV: 83.2 fL (ref 80.0–100.0)
Platelets: 233 10*3/uL (ref 150–400)
RBC: 4.11 MIL/uL — ABNORMAL LOW (ref 4.22–5.81)
RDW: 14.5 % (ref 11.5–15.5)
WBC: 4.4 10*3/uL (ref 4.0–10.5)
nRBC: 0 % (ref 0.0–0.2)

## 2022-08-20 MED ORDER — PHENAZOPYRIDINE HCL 200 MG PO TABS
200.0000 mg | ORAL_TABLET | ORAL | Status: AC
  Administered 2022-08-20: 200 mg via ORAL
  Filled 2022-08-20: qty 1

## 2022-08-20 MED ORDER — CEPHALEXIN 500 MG PO CAPS
500.0000 mg | ORAL_CAPSULE | Freq: Once | ORAL | Status: AC
  Administered 2022-08-20: 500 mg via ORAL
  Filled 2022-08-20: qty 1

## 2022-08-20 MED ORDER — PHENAZOPYRIDINE HCL 200 MG PO TABS: 200.0000 mg | ORAL_TABLET | Freq: Three times a day (TID) | ORAL | 0 refills | Status: DC

## 2022-08-20 MED ORDER — CEPHALEXIN 500 MG PO CAPS: 500.0000 mg | ORAL_CAPSULE | Freq: Three times a day (TID) | ORAL | 0 refills | Status: AC

## 2022-08-20 MED ORDER — PHENAZOPYRIDINE HCL 200 MG PO TABS: 200.0000 mg | ORAL_TABLET | Freq: Three times a day (TID) | ORAL | Status: DC

## 2022-08-20 NOTE — ED Provider Notes (Signed)
[place] EMERGENCY DEPARTMENT AT [facility] Provider Note   CSN: [id number] Arrival date & time: 08/20/22  1023     History  Chief Complaint  Patient presents with   Hematuria    [name] is a 82 y.o. male.   Hematuria     Patient has a history of hypertension prostate hyperplasia, hyperlipidemia, asthma, PE, prostate cancer who presents to the ED with complaints of urinary discomfort.  Patient states he started having symptoms over a week ago.  He has noted small amount of blood recently and has had decreased urine output.  It does burn when he tries to urinate.  He denies any fever.  No vomiting.  Home Medications Prior to Admission medications   Medication Sig Start Date End Date Taking? Authorizing Provider  cephALEXin (KEFLEX) 500 MG capsule Take 1 capsule (500 mg total) by mouth 3 (three) times daily for 7 days. 08/20/22 08/27/22 Yes Linwood Dibbles, MD  phenazopyridine (PYRIDIUM) 200 MG tablet Take 1 tablet (200 mg total) by mouth 3 (three) times daily. 08/20/22  Yes Linwood Dibbles, MD  latanoprost (XALATAN) 0.005 % ophthalmic solution 1 drop at bedtime. Patient not taking: Reported on 03/13/2021 12/14/18   [provider]  tamsulosin (FLOMAX) 0.4 MG CAPS capsule Take 0.4 mg by mouth daily. Patient not taking: Reported on 02/18/2022 08/07/21   [provider]  terbinafine (LAMISIL) 250 MG tablet Take 1 tablet (250 mg total) by mouth daily. Patient not taking: Reported on 02/18/2022 08/30/21   Miguel Aschoff, MD      Allergies    Patient has no known allergies.    Review of Systems   Review of Systems  Genitourinary:  Positive for hematuria.    Physical Exam Updated Vital Signs BP 131/66   Pulse (!) 50   Temp 98.1 F (36.7 C)   Resp 18   Ht 1.803 m (5\' 11" )   Wt 80.3 kg   SpO2 100%   BMI 24.69 kg/m  Physical Exam Vitals and nursing note reviewed.  Constitutional:      Appearance: He is well-developed. He is not diaphoretic.   HENT:     Head: Normocephalic and atraumatic.     Right Ear: External ear normal.     Left Ear: External ear normal.  Eyes:     General: No scleral icterus.       Right eye: No discharge.        Left eye: No discharge.     Conjunctiva/sclera: Conjunctivae normal.  Neck:     Trachea: No tracheal deviation.  Cardiovascular:     Rate and Rhythm: Normal rate and regular rhythm.  Pulmonary:     Effort: Pulmonary effort is normal. No respiratory distress.     Breath sounds: Normal breath sounds. No stridor. No wheezing or rales.  Abdominal:     General: Bowel sounds are normal. There is no distension.     Palpations: Abdomen is soft.     Tenderness: There is abdominal tenderness. There is no guarding or rebound.     Comments: Mild tenderness palpation suprapubic region  Musculoskeletal:        General: No tenderness or deformity.     Cervical back: Neck supple.  Skin:    General: Skin is warm and dry.     Findings: No rash.  Neurological:     General: No focal deficit present.     Mental Status: He is alert.     Cranial Nerves:  No cranial nerve deficit, dysarthria or facial asymmetry.     Sensory: No sensory deficit.     Motor: No abnormal muscle tone or seizure activity.     Coordination: Coordination normal.  Psychiatric:        Mood and Affect: Mood normal.     ED Results / Procedures / Treatments   Labs (all labs ordered are listed, but only abnormal results are displayed) Labs Reviewed  URINALYSIS, ROUTINE W REFLEX MICROSCOPIC - Abnormal; Notable for the following components:      Result Value   Hgb urine dipstick MODERATE (*)    Protein, ur 100 (*)    Bacteria, UA RARE (*)    All other components within normal limits  CBC - Abnormal; Notable for the following components:   RBC 4.11 (*)    Hemoglobin 11.1 (*)    HCT 34.2 (*)    All other components within normal limits  BASIC METABOLIC PANEL - Abnormal; Notable for the following components:   Potassium 3.3 (*)     All other components within normal limits  URINE CULTURE    EKG None  Radiology CT Renal Stone Study  Result Date: 08/20/2022 CLINICAL DATA:  Abdominal/flank pain, stone suspected. EXAM: CT ABDOMEN AND PELVIS WITHOUT CONTRAST TECHNIQUE: Multidetector CT imaging of the abdomen and pelvis was performed following the standard protocol without IV contrast. RADIATION DOSE REDUCTION: This exam was performed according to the departmental dose-optimization program which includes automated exposure control, adjustment of the mA and/or kV according to patient size and/or use of iterative reconstruction technique. COMPARISON:  None Available. FINDINGS: Lower chest: No acute abnormality. Hepatobiliary: No focal liver abnormality is seen. No gallstones, gallbladder wall thickening, or biliary dilatation. Pancreas: Unremarkable. No pancreatic ductal dilatation or surrounding inflammatory changes. Spleen: Normal in size without focal abnormality. Adrenals/Urinary Tract: Adrenal glands are unremarkable. Unchanged simple appearing bilateral renal cysts (No follow-up imaging is recommended). No urinary calculi or hydronephrosis. Marked bladder wall thickening and surrounding inflammation, concerning for acute cystitis. Stomach/Bowel: Normal stomach and duodenum. No dilated loops of small bowel. Normal appendix is visualized on axial image 48 series 2. Descending and sigmoid diverticulosis without surrounding inflammation to suggest acute diverticulitis. Vascular/Lymphatic: Aortic atherosclerosis. No enlarged abdominal or pelvic lymph nodes. Reproductive: Brachytherapy seeds in the prostate. Other: No abdominal wall hernia or abnormality. No abdominopelvic ascites. Musculoskeletal: No acute or significant osseous findings. IMPRESSION: 1. Marked bladder wall thickening and surrounding inflammation, concerning for acute cystitis. Correlate with urinalysis. 2. No urinary calculi or hydronephrosis. 3. Descending and sigmoid  diverticulosis without evidence of acute diverticulitis. Aortic Atherosclerosis (ICD10-I70.0). Electronically Signed   By: Orvan Falconer M.D.   On: 08/20/2022 13:04    Procedures Procedures    Medications Ordered in ED Medications  cephALEXin (KEFLEX) capsule 500 mg (has no administration in time range)  phenazopyridine (PYRIDIUM) tablet 200 mg (has no administration in time range)    ED Course/ Medical Decision Making/ A&P Clinical Course as of 08/20/22 1419  Tue Aug 20, 2022  1356 CT scan does not show any evidence of kidney stones.  It does show evidence of bladder wall thickening and surrounding inflammation suggestive of infection [JK]    Clinical Course User Index [JK] Linwood Dibbles, MD                             Medical Decision Making Differential diagnosis includes but not to urinary tract infection, bladder neoplasm, kidney stone  Problems Addressed: Cystitis: acute illness or injury that poses a threat to life or bodily functions  Amount and/or Complexity of Data Reviewed Labs: ordered. Radiology: ordered.  Risk Prescription drug management.   Patient presented to the ED with complaints of urinary discomfort as well as blood in his urine.  Patient's urinalysis did show rare bacteria and large amount of red blood cells but not definitive for infection.  CBC and metabolic panel are reassuring.  CT scan does not show kidney stones but it does show marked bladder wall thickening.  I suspect patient's symptoms are consistent with urinary tract infection.  He is nontoxic, no fever, no signs systemic infection.  Will Discharge home with antibiotics.  Warning signs and precautions discussed.        Final Clinical Impression(s) / ED Diagnoses Final diagnoses:  Cystitis    Rx / DC Orders ED Discharge Orders          Ordered    cephALEXin (KEFLEX) 500 MG capsule  3 times daily        08/20/22 1415    phenazopyridine (PYRIDIUM) 200 MG tablet  3 times daily         08/20/22 1415              Linwood Dibbles, MD 08/20/22 1419

## 2022-08-20 NOTE — Discharge Instructions (Signed)
The lab tests show that you have a urinary tract infection.  The CT scan also suggests a urinary tract infection but no signs of kidney stone or other abnormality.  Take the antibiotics until they are finished.  The Pyridium medication is to help with your bladder pain and discomfort.  It will turn your urine a bright orange color but that we will stop once you finish taking the medication.  Return to the ER if you start having fevers or other concerning symptoms.  Follow-up with your primary doctor to be rechecked

## 2022-08-20 NOTE — ED Triage Notes (Signed)
Pt reports burning and blood on urination for multiple days as well as decreased output. Hx of prostate cancer.

## 2022-08-21 ENCOUNTER — Telehealth: Payer: Self-pay | Admitting: Internal Medicine

## 2022-08-21 LAB — URINE CULTURE: Culture: <10000 — AB

## 2022-08-21 NOTE — Telephone Encounter (Signed)
Received message that [name] was evaluated in the emergency department yesterday for dysuria and hematuria.  Urinalysis notable for hematuria though without pyuria and rare bacteria.  CT stone study notable for bladder wall thickening suggestive of inflammation.  Treated empirically for infection with Keflex and Pyridium.  I am more concerned about possible radiation cystitis given his seed implants for prostate cancer.  I called and spoke to Mr. Nipp son, Iantha Fallen, who reports that his father has an appointment scheduled with urologist Dr. Alvester Morin the first week of August.  I reiterated the importance of this follow-up under the circumstances.  Mr. Correia has not had a visit with me for a year, and we will get a routine checkup scheduled soon.

## 2022-08-26 ENCOUNTER — Telehealth: Payer: Self-pay

## 2022-08-26 NOTE — Telephone Encounter (Signed)
Transition Care Management Follow-up Telephone Call Date of discharge and from where: 08/20/2022 [facility] How have you been since you were released from the hospital? Patient is feeling better Any questions or concerns? No  Items Reviewed: Did the pt receive and understand the discharge instructions provided? Yes  Medications obtained and verified? Yes  Other? No  Any new allergies since your discharge? No  Dietary orders reviewed? Yes Do you have support at home? Yes   Follow up appointments reviewed:  PCP Hospital f/u appt confirmed?  Patient followed up with PCP via phone call.  Scheduled to see Miguel Aschoff, MD on 08/21/2022 @ [facility]. Specialist Hospital f/u appt confirmed? No  Scheduled to see  on  @ . Are transportation arrangements needed? No  If their condition worsens, is the pt aware to call PCP or go to the Emergency Dept.? Yes Was the patient provided with contact information for the PCP's office or ED? Yes Was to pt encouraged to call back with questions or concerns? Yes  Oskar Cretella [facility]  [facility] Population Health Community Resource Care Guide   [email or web address]  ?? [phone]   Website: [email or web address]  [email or web address]

## 2022-09-02 DIAGNOSIS — C61 Malignant neoplasm of prostate: Secondary | ICD-10-CM | POA: Diagnosis not present

## 2022-09-03 ENCOUNTER — Encounter (HOSPITAL_COMMUNITY): Payer: Self-pay

## 2022-09-03 ENCOUNTER — Emergency Department (HOSPITAL_COMMUNITY)
Admission: EM | Admit: 2022-09-03 | Discharge: 2022-09-03 | Disposition: A | Discharge status: Home / Self Care | Payer: Medicare HMO | Attending: Emergency Medicine | Admitting: Emergency Medicine

## 2022-09-03 ENCOUNTER — Other Ambulatory Visit: Payer: Self-pay

## 2022-09-03 DIAGNOSIS — R300 Dysuria: Secondary | ICD-10-CM | POA: Diagnosis present

## 2022-09-03 DIAGNOSIS — B3731 Acute candidiasis of vulva and vagina: Secondary | ICD-10-CM | POA: Diagnosis not present

## 2022-09-03 DIAGNOSIS — Z8546 Personal history of malignant neoplasm of prostate: Secondary | ICD-10-CM | POA: Diagnosis not present

## 2022-09-03 DIAGNOSIS — K627 Radiation proctitis: Secondary | ICD-10-CM | POA: Diagnosis not present

## 2022-09-03 DIAGNOSIS — I10 Essential (primary) hypertension: Secondary | ICD-10-CM | POA: Insufficient documentation

## 2022-09-03 DIAGNOSIS — B379 Candidiasis, unspecified: Secondary | ICD-10-CM

## 2022-09-03 LAB — COMPREHENSIVE METABOLIC PANEL
ALT: 12 U/L (ref 0–44)
AST: 19 U/L (ref 15–41)
Albumin: 3.9 g/dL (ref 3.5–5.0)
Alkaline Phosphatase: 53 U/L (ref 38–126)
Anion gap: 9 (ref 5–15)
BUN: 17 mg/dL (ref 8–23)
CO2: 25 mmol/L (ref 22–32)
Calcium: 9.4 mg/dL (ref 8.9–10.3)
Chloride: 105 mmol/L (ref 98–111)
Creatinine, Ser: 0.96 mg/dL (ref 0.61–1.24)
GFR, Estimated: >60 mL/min (ref >60)
Glucose, Bld: 92 mg/dL (ref 70–99)
Potassium: 3.4 mmol/L — ABNORMAL LOW (ref 3.5–5.1)
Sodium: 139 mmol/L (ref 135–145)
Total Bilirubin: 0.3 mg/dL (ref 0.3–1.2)
Total Protein: 7.5 g/dL (ref 6.5–8.1)

## 2022-09-03 LAB — URINALYSIS, ROUTINE W REFLEX MICROSCOPIC
Bacteria, UA: NONE SEEN
Bilirubin Urine: NEGATIVE
Glucose, UA: NEGATIVE mg/dL
Ketones, ur: NEGATIVE mg/dL
Leukocytes,Ua: NEGATIVE
Nitrite: NEGATIVE
Protein, ur: 100 mg/dL — AB
RBC / HPF: >50 RBC/hpf (ref 0–5)
Specific Gravity, Urine: 1.019 (ref 1.005–1.030)
pH: 5 (ref 5.0–8.0)

## 2022-09-03 LAB — CBC
HCT: 33.4 % — ABNORMAL LOW (ref 39.0–52.0)
Hemoglobin: 11 g/dL — ABNORMAL LOW (ref 13.0–17.0)
MCH: 27.4 pg (ref 26.0–34.0)
MCHC: 32.9 g/dL (ref 30.0–36.0)
MCV: 83.1 fL (ref 80.0–100.0)
Platelets: 245 10*3/uL (ref 150–400)
RBC: 4.02 MIL/uL — ABNORMAL LOW (ref 4.22–5.81)
RDW: 14.5 % (ref 11.5–15.5)
WBC: 4.6 10*3/uL (ref 4.0–10.5)
nRBC: 0 % (ref 0.0–0.2)

## 2022-09-03 MED ORDER — FLUCONAZOLE 150 MG PO TABS: 150.0000 mg | ORAL_TABLET | Freq: Every day | ORAL | 0 refills | Status: AC

## 2022-09-03 MED ORDER — FLUCONAZOLE 150 MG PO TABS
150.0000 mg | ORAL_TABLET | Freq: Once | ORAL | Status: AC
  Administered 2022-09-03: 150 mg via ORAL
  Filled 2022-09-03: qty 1

## 2022-09-03 MED ORDER — PHENAZOPYRIDINE HCL 200 MG PO TABS: 200.0000 mg | ORAL_TABLET | Freq: Three times a day (TID) | ORAL | 0 refills | Status: AC | PRN

## 2022-09-03 NOTE — ED Triage Notes (Signed)
Patient reports urinary frequency x 2 weeks. Was seen here and given abx. Finished the abx 4 days ago. Patient reports no relief from symptoms. Patient also now has groin pain that started yesterday. Patient denies blood in urine, fever, nausea and vomiting.

## 2022-09-03 NOTE — Discharge Instructions (Addendum)
As discussed, you have a yeast infection. I have prescribed Fluconazole (Diflucan) for you to take once a day for 7 days.  You have received your first dose in the ED tonight. Please complete the entirety of the medication to ensure resolution of infection.  I have also sent Pyridium to your pharmacy.  You can take this medication up to 3 times a day as needed for pain with urination.   Please keep your follow-up appointment with urologist next Monday.  They can reevaluate your symptoms, to make sure that you're pain with urination has improved.  Get help right away if: Your swelling and inflammation become so severe that you cannot urinate.

## 2022-09-03 NOTE — ED Provider Notes (Signed)
[place] EMERGENCY DEPARTMENT AT [facility] Provider Note   CSN: [id number] Arrival date & time: 09/03/22  1637     History  Chief Complaint  Patient presents with   Dysuria    [name] is a 82 y.o. male with a history of prostate cancer, aortic atherosclerosis, and hypertension who presents to the ED today, with his grandson, for pain with urination.  Patient reports that he was in the ED 2 weeks ago for the same concern.  He was started on Keflex and reports completing the entirety of the medication but the pain and burning with urination have persisted since.  He reports frequency and is unsure if he is completely emptying his bladder.  Patient denies fever, back pain, or abdominal pain.  No additional complaints or concerns at this time.    Home Medications Prior to Admission medications   Medication Sig Start Date End Date Taking? Authorizing Provider  fluconazole (DIFLUCAN) 150 MG tablet Take 1 tablet (150 mg total) by mouth daily for 7 days. 09/03/22 09/10/22 Yes Maxwell Marion, PA-C  phenazopyridine (PYRIDIUM) 200 MG tablet Take 1 tablet (200 mg total) by mouth 3 (three) times daily as needed for up to 7 days for pain. 09/03/22 09/10/22 Yes Maxwell Marion, PA-C      Allergies    Patient has no known allergies.    Review of Systems   Review of Systems  Genitourinary:  Positive for dysuria.  All other systems reviewed and are negative.   Physical Exam Updated Vital Signs BP (!) 145/122   Pulse 60   Temp 98.9 F (37.2 C) (Oral)   Resp 14   Ht 5\' 11"  (1.803 m)   Wt 80.3 kg   SpO2 100%   BMI 24.69 kg/m  Physical Exam Vitals and nursing note reviewed.  Constitutional:      Appearance: Normal appearance.  HENT:     Head: Normocephalic and atraumatic.     Mouth/Throat:     Mouth: Mucous membranes are moist.  Eyes:     Conjunctiva/sclera: Conjunctivae normal.     Pupils: Pupils are equal, round, and reactive to light.  Cardiovascular:     Rate and  Rhythm: Normal rate and regular rhythm.     Pulses: Normal pulses.  Pulmonary:     Effort: Pulmonary effort is normal.     Breath sounds: Normal breath sounds.  Abdominal:     Palpations: Abdomen is soft.     Tenderness: There is no abdominal tenderness. There is no right CVA tenderness or left CVA tenderness.  Genitourinary:    Penis: Normal.      Testes: Normal.     Comments: No erythema or discharge at the glans penis.  Skin:    General: Skin is warm and dry.     Findings: No rash.  Neurological:     General: No focal deficit present.     Mental Status: He is alert.     Sensory: No sensory deficit.     Motor: No weakness.  Psychiatric:        Mood and Affect: Mood normal.        Behavior: Behavior normal.     ED Results / Procedures / Treatments   Labs (all labs ordered are listed, but only abnormal results are displayed) Labs Reviewed  URINALYSIS, ROUTINE W REFLEX MICROSCOPIC - Abnormal; Notable for the following components:      Result Value   APPearance HAZY (*)    Hgb  urine dipstick LARGE (*)    Protein, ur 100 (*)    All other components within normal limits  CBC - Abnormal; Notable for the following components:   RBC 4.02 (*)    Hemoglobin 11.0 (*)    HCT 33.4 (*)    All other components within normal limits  COMPREHENSIVE METABOLIC PANEL - Abnormal; Notable for the following components:   Potassium 3.4 (*)    All other components within normal limits  URINE CULTURE    EKG None  Radiology No results found.  Procedures Procedures: not indicated.    Medications Ordered in ED Medications  fluconazole (DIFLUCAN) tablet 150 mg (150 mg Oral Given 09/03/22 2129)    ED Course/ Medical Decision Making/ A&P                                 Medical Decision Making Amount and/or Complexity of Data Reviewed Labs: ordered.  Risk Prescription drug management.   This patient presents to the ED for concern of dysuria, this involves an extensive number of  treatment options, and is a complaint that carries with it a high risk of complications and morbidity.   Differential diagnosis includes: cystitis, urethritis, etc.   Co morbidities that complicate the patient evaluation  See HPI above   Additional history  Additional history obtained from patient's records.   Lab Tests  I ordered and personally interpreted labs.  The pertinent results include:   CMP and CBC is within normal limits - no electrolyte abnormality, AKI, anemia, or infection UA shows no bacteria, leukocyte esterase, or nitrites.  Budding yeast is present.   Problem List / ED Course / Critical interventions / Medication management  Dysuria x 2 weeks I ordered medications including:  Diflucan for yeast infection  I have reviewed the patients home medicines and have made adjustments as needed   Social Determinants of Health  Physical activity   Test / Admission - Considered  Discussed results with patient and grandson at bedside. Prescription for Diflucan sent to pharmacy as well as Pyridium. Return precautions provided.       Final Clinical Impression(s) / ED Diagnoses Final diagnoses:  Yeast infection    Rx / DC Orders ED Discharge Orders          Ordered    fluconazole (DIFLUCAN) 150 MG tablet  Daily        09/03/22 2119    phenazopyridine (PYRIDIUM) 200 MG tablet  3 times daily PRN        09/03/22 2125              Maxwell Marion, PA-C 09/03/22 2232    Linwood Dibbles, MD 09/05/22 [phone]

## 2022-09-07 ENCOUNTER — Telehealth (HOSPITAL_BASED_OUTPATIENT_CLINIC_OR_DEPARTMENT_OTHER): Payer: Self-pay | Admitting: *Deleted

## 2022-09-07 NOTE — Telephone Encounter (Signed)
Post ED Visit - Positive Culture Follow-up  Culture report reviewed by antimicrobial stewardship pharmacist: [facility] []  Enzo Bi, Pharm.D. []  Celedonio Miyamoto, Pharm.D., BCPS AQ-ID []  Garvin Fila, Pharm.D., BCPS []  Georgina Pillion, Pharm.D., BCPS []  [place], [place].D., BCPS, AAHIVP []  Estella Husk, Pharm.D., BCPS, AAHIVP []  Lysle Pearl, PharmD, BCPS []  Phillips Climes, PharmD, BCPS []  Agapito Games, PharmD, BCPS []  Verlan Friends, PharmD []  Mervyn Gay, PharmD, BCPS []  Vinnie Level, PharmD  [facility] []  Len Childs, PharmD []  Greer Pickerel, PharmD []  Adalberto Cole, PharmD []  Perlie Gold, Rph []  Lonell Face) Jean Rosenthal, PharmD []  Earl Many, PharmD []  Junita Push, PharmD []  Dorna Leitz, PharmD []  Terrilee Files, PharmD []  Lynann Beaver, PharmD []  Keturah Barre, PharmD []  Loralee Pacas, PharmD [x]   Tacy Learn, PharmD   Positive urine culture Treated with Fluconazole. Continue Fluconazole. No change in plan per Alvester Chou, MD  Patsey Berthold 09/07/2022, 2:00 PM

## 2022-09-07 NOTE — Progress Notes (Addendum)
ED Antimicrobial Stewardship Positive Culture Follow Up   DUAINE LOCURTO is an 82 y.o. male who presented to [facility] on 09/03/2022 with a chief complaint of dysuria. Chief Complaint  Patient presents with   Dysuria    Recent Results (from the past 720 hour(s))  Urine Culture     Status: Abnormal   Collection Time: 08/20/22 10:44 AM   Specimen: Urine, Clean Catch  Result Value Ref Range Status   Specimen Description   Final    URINE, CLEAN CATCH Performed at [facility], [address] [place], [place] [address]    Special Requests   Final    NONE Performed at [facility], [address], [place], [place] [address]    Culture (A)  Final    <10,000 COLONIES/mL INSIGNIFICANT GROWTH Performed at [facility], [address] [place], [place] [address]    Report Status 08/21/2022 FINAL  Final  Urine Culture     Status: Abnormal   Collection Time: 09/03/22  4:52 PM   Specimen: Urine, Clean Catch  Result Value Ref Range Status   Specimen Description   Final    URINE, CLEAN CATCH Performed at [facility], [address] [place], [place] [address]    Special Requests   Final    NONE Performed at [facility], [address] [place], [place] [address]    Culture 10,000 COLONIES/mL ENTEROCOCCUS FAECALIS (A)  Final   Report Status 09/06/2022 FINAL  Final   Organism ID, Bacteria ENTEROCOCCUS FAECALIS (A)  Final      Susceptibility   Enterococcus faecalis - MIC*    AMPICILLIN <=2 SENSITIVE Sensitive     NITROFURANTOIN <=16 SENSITIVE Sensitive     VANCOMYCIN 2 SENSITIVE Sensitive     * 10,000 COLONIES/mL ENTEROCOCCUS FAECALIS    8/6 Urinalysis: showed no bacteria; showed budding yeast. 8/6 Urine Cx: Enterococcus faecalis 10,000 colonies/ml  Enterococcus likely a colonizer (low colony count) and no treatment needed. Patient discharged on Fluconazole.  Continue Fluconazole as ordered on  discharge.  New antibiotic prescription: none  ED Provider: Alvester Chou    Tylene Fantasia 09/07/2022, 1:21 PM Clinical Pharmacist Monday - Friday phone -  [phone] Saturday - Sunday phone - [phone]

## 2022-09-09 DIAGNOSIS — R300 Dysuria: Secondary | ICD-10-CM | POA: Diagnosis not present

## 2022-09-09 DIAGNOSIS — C61 Malignant neoplasm of prostate: Secondary | ICD-10-CM | POA: Diagnosis not present

## 2022-09-10 ENCOUNTER — Telehealth: Payer: Self-pay

## 2022-09-10 ENCOUNTER — Telehealth: Payer: Self-pay | Admitting: Internal Medicine

## 2022-09-10 NOTE — Telephone Encounter (Signed)
Transition Care Management Unsuccessful Follow-up Telephone Call  Date of discharge and from where:  [facility] 8/6  Attempts:  1st Attempt  Reason for unsuccessful TCM follow-up call:  No answer/busy   Lenard Forth [facility] Guide, [facility] [phone] [address] [place], [place], [place] [address] Phone: [phone] Email: [email or web address]

## 2022-09-10 NOTE — Telephone Encounter (Signed)
Informed through EMR that [name] was seen in the ED for dysuria.  Concerned in setting of history of prostate cancer, I called patient to recommend a urology appointment and as [name] was asleep, spoke to his son.  His son indicates that [name] was indeed seen by a urologist last week where he is being monitored for his prostate cancer, and his recent symptoms were made known to the provider.  I am satisfied with this and we will see him on September 5 for regular follow-up.

## 2022-09-11 ENCOUNTER — Telehealth: Payer: Self-pay

## 2022-09-11 NOTE — Telephone Encounter (Signed)
Transition Care Management Follow-up Telephone Call Date of discharge and from where: [facility] 8/6 How have you been since you were released from the hospital? Doing pretty good still some burning when using the bathroom Any questions or concerns? No  Items Reviewed: Did the pt receive and understand the discharge instructions provided? Yes  Medications obtained and verified? Yes  Other? No  Any new allergies since your discharge? No  Dietary orders reviewed? No Do you have support at home? Yes    Follow up appointments reviewed:  PCP Hospital f/u appt confirmed? Yes  Scheduled to see PCP on 9/5 @ . Specialist Hospital f/u appt confirmed? Yes  Scheduled to see  on 8/9 @ . Are transportation arrangements needed? No  If their condition worsens, is the pt aware to call PCP or go to the Emergency Dept.? Yes Was the patient provided with contact information for the PCP's office or ED? Yes Was to pt encouraged to call back with questions or concerns? Yes

## 2022-09-14 ENCOUNTER — Other Ambulatory Visit: Payer: Self-pay

## 2022-09-14 ENCOUNTER — Emergency Department (HOSPITAL_COMMUNITY)
Admission: EM | Admit: 2022-09-14 | Discharge: 2022-09-14 | Disposition: A | Discharge status: Home / Self Care | Payer: Medicare HMO | Attending: Emergency Medicine | Admitting: Emergency Medicine

## 2022-09-14 ENCOUNTER — Encounter (HOSPITAL_COMMUNITY): Payer: Self-pay | Admitting: Emergency Medicine

## 2022-09-14 DIAGNOSIS — R300 Dysuria: Secondary | ICD-10-CM | POA: Diagnosis present

## 2022-09-14 DIAGNOSIS — N3090 Cystitis, unspecified without hematuria: Secondary | ICD-10-CM

## 2022-09-14 DIAGNOSIS — Z8546 Personal history of malignant neoplasm of prostate: Secondary | ICD-10-CM | POA: Diagnosis not present

## 2022-09-14 DIAGNOSIS — N3000 Acute cystitis without hematuria: Secondary | ICD-10-CM | POA: Insufficient documentation

## 2022-09-14 LAB — URINALYSIS, ROUTINE W REFLEX MICROSCOPIC
Bilirubin Urine: NEGATIVE
Glucose, UA: NEGATIVE mg/dL
Ketones, ur: NEGATIVE mg/dL
Leukocytes,Ua: NEGATIVE
Nitrite: POSITIVE — AB
Protein, ur: 100 mg/dL — AB
Specific Gravity, Urine: 1.018 (ref 1.005–1.030)
pH: 5 (ref 5.0–8.0)

## 2022-09-14 MED ORDER — CEFTRIAXONE SODIUM 1 G IJ SOLR
500.0000 mg | Freq: Once | INTRAMUSCULAR | Status: AC
  Administered 2022-09-14: 500 mg via INTRAMUSCULAR
  Filled 2022-09-14: qty 10

## 2022-09-14 MED ORDER — CEPHALEXIN 500 MG PO CAPS: 500.0000 mg | ORAL_CAPSULE | Freq: Four times a day (QID) | ORAL | 0 refills | Status: DC

## 2022-09-14 MED ORDER — STERILE WATER FOR INJECTION IJ SOLN
INTRAMUSCULAR | Status: AC
  Administered 2022-09-14: 10 mL
  Filled 2022-09-14: qty 10

## 2022-09-14 NOTE — ED Triage Notes (Signed)
Patient reports burning with urination and frequency. He reports seeing blood with urination once. This seems to be a continuation of symptoms that he was seen for on 09/03/2022. He has taken all of his medication, but nothing has helped.

## 2022-09-14 NOTE — Discharge Instructions (Addendum)
You can take Keflex 4 times per day for the next 1 week.  Please follow-up with your primary care doctor next week.  Come back to the emergency department if you develop fever, chills, or feel as though you are getting worse.

## 2022-09-14 NOTE — ED Provider Notes (Signed)
[facility]ENC[facility] DE[facility]MENT AT [facility] Provider Note   CSN: [id number] Arrival date & time: 09/14/22  1350     History  Chief Complaint  Patient presents with   Dysuria    [name] is a 82 y.o. male.  This is an 82 year old male with a past history of prostate cancer comes to the emergency department today due to dysuria.  He has also noted increased urinary frequency.  Patient was seen here previously for similar, diagnosed with antibiotics, and subsequently antifungals.  Last urine culture tested positive for Enterococcus.   Dysuria Presenting symptoms: dysuria        Home Medications Prior to Admission medications   Medication Sig Start Date End Date Taking? Authorizing Provider  cephALEXin (KEFLEX) 500 MG capsule Take 1 capsule (500 mg total) by mouth 4 (four) times daily. 09/14/22  Yes Anders Simmonds T, DO      Allergies    Patient has no known allergies.    Review of Systems   Review of Systems  Genitourinary:  Positive for dysuria.    Physical Exam Updated Vital Signs BP 115/70   Pulse 66   Temp 98.4 F (36.9 C) (Oral)   Resp 16   SpO2 99%  Physical Exam Vitals reviewed.  Constitutional:      Appearance: Normal appearance.  Cardiovascular:     Rate and Rhythm: Normal rate.  Abdominal:     Palpations: Abdomen is soft.  Neurological:     Mental Status: He is alert.     ED Results / Procedures / Treatments   Labs (all labs ordered are listed, but only abnormal results are displayed) Labs Reviewed  URINALYSIS, ROUTINE W REFLEX MICROSCOPIC - Abnormal; Notable for the following components:      Result Value   APPearance CLOUDY (*)    Hgb urine dipstick MODERATE (*)    Protein, ur 100 (*)    Nitrite POSITIVE (*)    Bacteria, UA RARE (*)    All other components within normal limits    EKG None  Radiology No results found.  Procedures Procedures    Medications Ordered in ED Medications  cefTRIAXone  (ROCEPHIN) injection 500 mg (has no administration in time range)    ED Course/ Medical Decision Making/ A&P                                 Medical Decision Making 82 year old male here today for dysuria.  Differential diagnoses include cystitis, yeast infection.  Plan # looking at the patient's prior culture, it was positive for Enterococcus.  I looks like he was not sent with antibiotics at that time.  Will recheck urine today.  Patient has follow-up with urology on 5 September.  Patient not systemically ill, no indication for blood work at this time.  Reassessment-patient with nitrite positive UTI.  Will give dose of Rocephin here.  Discharged with Keflex.  Amount and/or Complexity of Data Reviewed Labs: ordered.  Risk Prescription drug management.           Final Clinical Impression(s) / ED Diagnoses Final diagnoses:  Cystitis    Rx / DC Orders ED Discharge Orders          Ordered    cephALEXin (KEFLEX) 500 MG capsule  4 times daily        09/14/22 1741  Anders Simmonds T, DO 09/14/22 [phone]

## 2022-09-20 ENCOUNTER — Telehealth: Payer: Self-pay

## 2022-09-20 NOTE — Telephone Encounter (Signed)
Transition Care Management Unsuccessful Follow-up Telephone Call  Date of discharge and from where:  [facility] 8/17  Attempts:  2nd Attempt  Reason for unsuccessful TCM follow-up call:  No answer/busy   Lenard Forth [place]  [facility], [facility] Guide, Phone: [phone] Website: [email or web address]

## 2022-09-20 NOTE — Telephone Encounter (Signed)
Transition Care Management Unsuccessful Follow-up Telephone Call  Date of discharge and from where:  [name] 8/17 Attempts:  1st Attempt  Reason for unsuccessful TCM follow-up call:  No answer/busy   Lenard Forth [place]  [facility], [facility] Guide, Phone: [phone] Website: [email or web address]

## 2022-10-03 ENCOUNTER — Ambulatory Visit (INDEPENDENT_AMBULATORY_CARE_PROVIDER_SITE_OTHER): Payer: Medicare HMO | Admitting: Internal Medicine

## 2022-10-03 VITALS — BP 141/55 | HR 59 | Temp 97.8°F | Ht 71.0 in | Wt 181.7 lb

## 2022-10-03 DIAGNOSIS — I10 Essential (primary) hypertension: Secondary | ICD-10-CM | POA: Diagnosis not present

## 2022-10-03 DIAGNOSIS — N029 Recurrent and persistent hematuria with unspecified morphologic changes: Secondary | ICD-10-CM | POA: Insufficient documentation

## 2022-10-03 DIAGNOSIS — H9193 Unspecified hearing loss, bilateral: Secondary | ICD-10-CM | POA: Diagnosis not present

## 2022-10-03 DIAGNOSIS — H409 Unspecified glaucoma: Secondary | ICD-10-CM

## 2022-10-03 DIAGNOSIS — I700 Atherosclerosis of aorta: Secondary | ICD-10-CM | POA: Diagnosis not present

## 2022-10-03 HISTORY — DX: Recurrent and persistent hematuria with unspecified morphologic changes: N02.9

## 2022-10-03 NOTE — Assessment & Plan Note (Signed)
Elevated readings in the chart over the past couple of months were associated with ED visits when he was experiencing discomfort.  141/55 today, although unfortunately we did not get it rechecked before he left.  Recheck at next visit.

## 2022-10-03 NOTE — Assessment & Plan Note (Signed)
Has still not seen an eye specialist.  Encouraged to do so.

## 2022-10-03 NOTE — Assessment & Plan Note (Signed)
Multiple visits to the ER.  Some episodes of hematuria have  been associated with UTI and have responded to treatment.  Low PSA obtained at urology office though he has not seen a provider as I understand.  It is quite possible that he is experiencing irritation from his radioactive seed implants.  It is reassuring that the PSA was low given his history of cancer.  I do want him to follow-up with urologist to determine whether cystoscopy is indicated.

## 2022-10-03 NOTE — Progress Notes (Signed)
[name] is here for routine visit.  We have not seen each other for about a year; in 2023 he was recovering from prostate cancer treated with radioactive seed implants.  In the interim he has had multiple episodes of hematuria, some associated with infection and improving with antibiotics, and some independent of infection.  He has been seen in the ED for this problem 3 times in the past month or 2.  During 1 of these episodes he was having frequent urination without large volumes, and sensation of incomplete emptying, with terminal dysuria.  All of this has improved if not resolved, though he does have very mild terminal dysuria remaining, no visible blood at this time.  He did follow-up with his urologist and apparently a PSA was reassuringly very low.  I was not quite clear whether he saw a provider.  At any rate, it does not sound as though a cystoscopy has been done this year.  [name] has been otherwise doing well.  He has wonderful family support, gets out and about, enjoys fishing.  Objective: BP (!) 141/55 (BP Location: Right Arm, Patient Position: Sitting, Cuff Size: Small)   Pulse (!) 59   Temp 97.8 F (36.6 C) (Oral)   Ht 5\' 11"  (1.803 m)   Wt 181 lb 11.2 oz (82.4 kg)   SpO2 100%   BMI 25.34 kg/m  Well-appearing man, nicely dressed and groomed, very engaging in interaction and fun to talk to.  Heart RRR without significant murmur.  Lungs are clear.  Lower extremities without edema and dorsalis pedis pulses are 1+.  Hands with full grips, no arthritic joints.  Walks with stable gait.  Skin turgor normal.  Assessment and plan:  Decreased hearing of both ears Has now received hearing aides which are in place and which are working well.  Glaucoma Has still not seen an eye specialist.  Encouraged to do so.  Recurrent gross hematuria Multiple visits to the ER.  Some episodes of hematuria have  been associated with UTI and have responded to treatment.  Low PSA obtained at  urology office though he has not seen a provider as I understand.  It is quite possible that he is experiencing irritation from his radioactive seed implants.  It is reassuring that the PSA was low given his history of cancer.  I do want him to follow-up with urologist to determine whether cystoscopy is indicated.  Aortic atherosclerosis (HCC) Dorsalis pedis pulses are 1+ bilaterally today.  He walks a good bit and has not experienced claudication.  History of hyperlipidemia not on statin-we agreed to obtain lipid panel today and discussed role of therapy.  This seems to be his only risk factor for CVD.  Hypertension, currently not requiring medication. Elevated readings in the chart over the past couple of months were associated with ED visits when he was experiencing discomfort.  141/55 today, although unfortunately we did not get it rechecked before he left.  Recheck at next visit.

## 2022-10-03 NOTE — Assessment & Plan Note (Signed)
Dorsalis pedis pulses are 1+ bilaterally today.  He walks a good bit and has not experienced claudication.  History of hyperlipidemia not on statin-we agreed to obtain lipid panel today and discussed role of therapy.  This seems to be his only risk factor for CVD.

## 2022-10-03 NOTE — Assessment & Plan Note (Signed)
Has now received hearing aides which are in place and which are working well.

## 2022-10-04 LAB — LIPID PANEL
Chol/HDL Ratio: 3.9 ratio (ref 0.0–5.0)
Cholesterol, Total: 138 mg/dL (ref 100–199)
HDL: 35 mg/dL — ABNORMAL LOW (ref >39)
LDL Chol Calc (NIH): 83 mg/dL (ref 0–99)
Triglycerides: 111 mg/dL (ref 0–149)
VLDL Cholesterol Cal: 20 mg/dL (ref 5–40)

## 2022-10-04 NOTE — Progress Notes (Signed)
Spoke by phone to Mr. Rago to let him know his cholesterol panel looks great.  I will see him at next visit.

## 2022-10-17 DIAGNOSIS — N3041 Irradiation cystitis with hematuria: Secondary | ICD-10-CM | POA: Diagnosis not present

## 2022-10-24 DIAGNOSIS — C61 Malignant neoplasm of prostate: Secondary | ICD-10-CM | POA: Diagnosis not present

## 2022-10-24 DIAGNOSIS — N3041 Irradiation cystitis with hematuria: Secondary | ICD-10-CM | POA: Diagnosis not present

## 2022-11-06 ENCOUNTER — Encounter (HOSPITAL_BASED_OUTPATIENT_CLINIC_OR_DEPARTMENT_OTHER): Payer: Medicare HMO | Attending: Physician Assistant | Admitting: Physician Assistant

## 2022-11-06 DIAGNOSIS — N401 Enlarged prostate with lower urinary tract symptoms: Secondary | ICD-10-CM | POA: Diagnosis not present

## 2022-11-06 DIAGNOSIS — Z8249 Family history of ischemic heart disease and other diseases of the circulatory system: Secondary | ICD-10-CM | POA: Insufficient documentation

## 2022-11-06 DIAGNOSIS — N3041 Irradiation cystitis with hematuria: Secondary | ICD-10-CM | POA: Diagnosis not present

## 2022-11-06 DIAGNOSIS — L598 Other specified disorders of the skin and subcutaneous tissue related to radiation: Secondary | ICD-10-CM | POA: Diagnosis not present

## 2022-11-06 DIAGNOSIS — I10 Essential (primary) hypertension: Secondary | ICD-10-CM | POA: Insufficient documentation

## 2022-11-06 DIAGNOSIS — R351 Nocturia: Secondary | ICD-10-CM | POA: Insufficient documentation

## 2022-11-07 NOTE — Progress Notes (Signed)
MAXSON, ODDO ([id number]) [id number] Page 1 of 6 Visit Report for 11/06/2022 Allergy List Details Patient Name: Date of Service: [name], [name] 11/06/2022 9:30 A M Medical Record Number: [id number] Patient Account Number: [id number] Date of Birth/Sex: Treating RN: 05-24-1940 (82 y.o. [name] Primary Care Marena Witts: Charissa Bash Other Clinician: Referring Ronnie Doo: Treating Ryver Zadrozny/Extender: Donnal Debar Weeks in Treatment: 0 Allergies Active Allergies No Known Drug Allergies Allergy Notes Electronic Signature(s) Signed: 11/06/2022 5:41:53 PM By: Thayer Dallas Entered By: Thayer Dallas on 11/06/2022 06:10:28 -------------------------------------------------------------------------------- Arrival Information Details Patient Name: Date of Service: [name]. 11/06/2022 9:30 A M Medical Record Number: [id number] Patient Account Number: [id number] Date of Birth/Sex: Treating RN: [birth year] (82 y.o. M) Primary Care Karem Tomaso: Charissa Bash Other Clinician: Referring Yamir Carignan: Treating Marchelle Rinella/Extender: Debby Bud in Treatment: 0 Visit Information Patient Arrived: Ambulatory Arrival Time: 09:05 Accompanied By: son Transfer Assistance: None Patient Identification Verified: Yes Secondary Verification Process Completed: Yes Patient Requires Transmission-Based Precautions: No Patient Has Alerts: No Electronic Signature(s) Signed: 11/06/2022 5:41:53 PM By: Thayer Dallas Entered By: Thayer Dallas on 11/06/2022 06:07:14 -------------------------------------------------------------------------------- Clinic Level of Care Assessment Details Patient Name: Date of Service: [name], [name] 11/06/2022 9:30 A M Medical Record Number: [id number] Patient Account Number: [id number] [name], [name] ([id number]) [phone].pdf Page 2 of 6 Date of Birth/Sex: Treating  RN: 1940-03-07 (82 y.o. [name] Primary Care Shalev Helminiak: Other Clinician: Charissa Bash Referring Rashard Ryle: Treating Maximilian Tallo/Extender: Debby Bud in Treatment: 0 Clinic Level of Care Assessment Items TOOL 2 Quantity Score X- 1 0 Use when only an EandM is performed on the INITIAL visit ASSESSMENTS - Nursing Assessment / Reassessment X- 1 20 General Physical Exam (combine w/ comprehensive assessment (listed just below) when performed on new pt. evals) X- 1 25 Comprehensive Assessment (HX, ROS, Risk Assessments, Wounds Hx, etc.) ASSESSMENTS - Wound and Skin A ssessment / Reassessment []  - 0 Simple Wound Assessment / Reassessment - one wound []  - 0 Complex Wound Assessment / Reassessment - multiple wounds []  - 0 Dermatologic / Skin Assessment (not related to wound area) ASSESSMENTS - Ostomy and/or Continence Assessment and Care []  - 0 Incontinence Assessment and Management []  - 0 Ostomy Care Assessment and Management (repouching, etc.) PROCESS - Coordination of Care X - Simple Patient / Family Education for ongoing care 1 15 []  - 0 Complex (extensive) Patient / Family Education for ongoing care X- 1 10 Staff obtains Chiropractor, Records, T Results / Process Orders est []  - 0 Staff telephones HHA, Nursing Homes / Clarify orders / etc []  - 0 Routine Transfer to another Facility (non-emergent condition) []  - 0 Routine Hospital Admission (non-emergent condition) X- 1 15 New Admissions / Manufacturing engineer / Ordering NPWT Apligraf, etc. , []  - 0 Emergency Hospital Admission (emergent condition) X- 1 10 Simple Discharge Coordination []  - 0 Complex (extensive) Discharge Coordination PROCESS - Special Needs []  - 0 Pediatric / Minor Patient Management []  - 0 Isolation Patient Management []  - 0 Hearing / Language / Visual special needs []  - 0 Assessment of Community assistance (transportation, D/C planning, etc.) []  - 0 Additional  assistance / Altered mentation []  - 0 Support Surface(s) Assessment (bed, cushion, seat, etc.) INTERVENTIONS - Wound Cleansing / Measurement []  - 0 Wound Imaging (photographs - any number of wounds) []  - 0 Wound Tracing (instead of photographs) []  - 0 Simple Wound Measurement - one wound []  - 0 Complex Wound Measurement -  multiple wounds []  - 0 Simple Wound Cleansing - one wound []  - 0 Complex Wound Cleansing - multiple wounds INTERVENTIONS - Wound Dressings []  - 0 Small Wound Dressing one or multiple wounds []  - 0 Medium Wound Dressing one or multiple wounds []  - 0 Large Wound Dressing one or multiple wounds []  - 0 Application of Medications - injection [name], [name] ([id number]) [id number] Page 3 of 6 INTERVENTIONS - Miscellaneous []  - 0 External ear exam []  - 0 Specimen Collection (cultures, biopsies, blood, body fluids, etc.) []  - 0 Specimen(s) / Culture(s) sent or taken to Lab for analysis []  - 0 Patient Transfer (multiple staff / Michiel Sites Lift / Similar devices) []  - 0 Simple Staple / Suture removal (25 or less) []  - 0 Complex Staple / Suture removal (26 or more) []  - 0 Hypo / Hyperglycemic Management (close monitor of Blood Glucose) []  - 0 Ankle / Brachial Index (ABI) - do not check if billed separately Has the patient been seen at the hospital within the last three years: Yes Total Score: 95 Level Of Care: New/Established - Level 3 Electronic Signature(s) Signed: 11/06/2022 5:49:45 PM By: Shawn Stall RN, BSN Entered By: Shawn Stall on 11/06/2022 06:52:25 -------------------------------------------------------------------------------- Lower Extremity Assessment Details Patient Name: Date of Service: [name]. 11/06/2022 9:30 A M Medical Record Number: [id number] Patient Account Number: [id number] Date of Birth/Sex: Treating RN: [birth year] (82 y.o. M) Primary Care Tsering Leaman: Charissa Bash Other Clinician: Referring  Yarielys Beed: Treating Shelia Magallon/Extender: Debby Bud in Treatment: 0 Notes no wounds Electronic Signature(s) Signed: 11/06/2022 5:41:53 PM By: Thayer Dallas Entered By: Thayer Dallas on 11/06/2022 06:12:55 -------------------------------------------------------------------------------- Multi-Disciplinary Care Plan Details Patient Name: Date of Service: [name]. 11/06/2022 9:30 A M Medical Record Number: [id number] Patient Account Number: [id number] Date of Birth/Sex: Treating RN: 05/03/1940 (82 y.o. [name], Millard.Loa Primary Care Makenlee Mckeag: Charissa Bash Other Clinician: Referring Jaslene Marsteller: Treating Theta Leaf/Extender: Debby Bud in Treatment: 0 Active Inactive HBO Nursing Diagnoses: Anxiety related to feelings of confinement associated with the hyperbaric oxygen chamber Potential for barotraumas to ears, sinuses, teeth, and lungs or cerebral gas embolism related to changes in atmospheric pressure inside hyperbaric oxygen chamber LYMAN, BALINGIT ([id number]) [id number] Page 4 of 6 Goals: Barotrauma will be prevented during HBO2 Date Initiated: 11/06/2022 Target Resolution Date: 12/06/2022 Goal Status: Active Patient will tolerate the internal climate of the chamber Date Initiated: 11/06/2022 Target Resolution Date: 12/06/2022 Goal Status: Active Interventions: Assess and provide for patients comfort related to the hyperbaric environment and equalization of middle ear Notes: Orientation to the Wound Care Program Nursing Diagnoses: Knowledge deficit related to the wound healing center program Goals: Patient/caregiver will verbalize understanding of the Wound Healing Center Program Date Initiated: 11/06/2022 Target Resolution Date: 12/06/2022 Goal Status: Active Interventions: Provide education on orientation to the wound center Notes: Pain, Acute or Chronic Nursing Diagnoses: Pain, acute  or chronic: actual or potential Potential alteration in comfort, pain Goals: Patient will verbalize adequate pain control and receive pain control interventions during procedures as needed Date Initiated: 11/06/2022 Target Resolution Date: 12/06/2022 Goal Status: Active Patient/caregiver will verbalize comfort level met Date Initiated: 11/06/2022 Target Resolution Date: 12/06/2022 Goal Status: Active Interventions: Assess comfort goal upon admission Provide education on pain management Treatment Activities: Administer pain control measures as ordered : 11/06/2022 Notes: Electronic Signature(s) Signed: 11/06/2022 5:49:45 PM By: Shawn Stall RN, BSN Entered By: Shawn Stall on 11/06/2022 06:50:57 -------------------------------------------------------------------------------- Pain Assessment Details Patient Name: Date of  multiple wounds []  - 0 Simple Wound Cleansing - one wound []  - 0 Complex Wound Cleansing - multiple wounds INTERVENTIONS - Wound Dressings []  - 0 Small Wound Dressing one or multiple wounds []  - 0 Medium Wound Dressing one or multiple wounds []  - 0 Large Wound Dressing one or multiple wounds []  - 0 Application of Medications - injection [name], [name] ([id number]) [id number] Page 3 of 6 INTERVENTIONS - Miscellaneous []  - 0 External ear exam []  - 0 Specimen Collection (cultures, biopsies, blood, body fluids, etc.) []  - 0 Specimen(s) / Culture(s) sent or taken to Lab for analysis []  - 0 Patient Transfer (multiple staff / Michiel Sites Lift / Similar devices) []  - 0 Simple Staple / Suture removal (25 or less) []  - 0 Complex Staple / Suture removal (26 or more) []  - 0 Hypo / Hyperglycemic Management (close monitor of Blood Glucose) []  - 0 Ankle / Brachial Index (ABI) - do not check if billed separately Has the patient been seen at the hospital within the last three years: Yes Total Score: 95 Level Of Care: New/Established - Level 3 Electronic Signature(s) Signed: 11/06/2022 5:49:45 PM By: Shawn Stall RN, BSN Entered By: Shawn Stall on 11/06/2022 06:52:25 -------------------------------------------------------------------------------- Lower Extremity Assessment Details Patient Name: Date of Service: [name]. 11/06/2022 9:30 A M Medical Record Number: [id number] Patient Account Number: [id number] Date of Birth/Sex: Treating RN: [birth year] (82 y.o. M) Primary Care Tsering Leaman: Charissa Bash Other Clinician: Referring  Yarielys Beed: Treating Shelia Magallon/Extender: Debby Bud in Treatment: 0 Notes no wounds Electronic Signature(s) Signed: 11/06/2022 5:41:53 PM By: Thayer Dallas Entered By: Thayer Dallas on 11/06/2022 06:12:55 -------------------------------------------------------------------------------- Multi-Disciplinary Care Plan Details Patient Name: Date of Service: [name]. 11/06/2022 9:30 A M Medical Record Number: [id number] Patient Account Number: [id number] Date of Birth/Sex: Treating RN: 05/03/1940 (82 y.o. [name], Millard.Loa Primary Care Makenlee Mckeag: Charissa Bash Other Clinician: Referring Jaslene Marsteller: Treating Theta Leaf/Extender: Debby Bud in Treatment: 0 Active Inactive HBO Nursing Diagnoses: Anxiety related to feelings of confinement associated with the hyperbaric oxygen chamber Potential for barotraumas to ears, sinuses, teeth, and lungs or cerebral gas embolism related to changes in atmospheric pressure inside hyperbaric oxygen chamber LYMAN, BALINGIT ([id number]) [id number] Page 4 of 6 Goals: Barotrauma will be prevented during HBO2 Date Initiated: 11/06/2022 Target Resolution Date: 12/06/2022 Goal Status: Active Patient will tolerate the internal climate of the chamber Date Initiated: 11/06/2022 Target Resolution Date: 12/06/2022 Goal Status: Active Interventions: Assess and provide for patients comfort related to the hyperbaric environment and equalization of middle ear Notes: Orientation to the Wound Care Program Nursing Diagnoses: Knowledge deficit related to the wound healing center program Goals: Patient/caregiver will verbalize understanding of the Wound Healing Center Program Date Initiated: 11/06/2022 Target Resolution Date: 12/06/2022 Goal Status: Active Interventions: Provide education on orientation to the wound center Notes: Pain, Acute or Chronic Nursing Diagnoses: Pain, acute  or chronic: actual or potential Potential alteration in comfort, pain Goals: Patient will verbalize adequate pain control and receive pain control interventions during procedures as needed Date Initiated: 11/06/2022 Target Resolution Date: 12/06/2022 Goal Status: Active Patient/caregiver will verbalize comfort level met Date Initiated: 11/06/2022 Target Resolution Date: 12/06/2022 Goal Status: Active Interventions: Assess comfort goal upon admission Provide education on pain management Treatment Activities: Administer pain control measures as ordered : 11/06/2022 Notes: Electronic Signature(s) Signed: 11/06/2022 5:49:45 PM By: Shawn Stall RN, BSN Entered By: Shawn Stall on 11/06/2022 06:50:57 -------------------------------------------------------------------------------- Pain Assessment Details Patient Name: Date of

## 2022-11-07 NOTE — Progress Notes (Signed)
[name], [name] ([id number]) [id number] Page 1 of 4 Visit Report for 11/06/2022 Abuse Risk Screen Details Patient Name: Date of Service: [name], [name] 11/06/2022 9:30 A M Medical Record Number: [id number] Patient Account Number: [id number] Date of Birth/Sex: Treating RN: 30-Apr-1940 (82 y.o. M) Primary Care [name]: Charissa Bash Other Clinician: Referring [name]: Treating [name]/Extender: Debby Bud in Treatment: 0 Abuse Risk Screen Items Answer ABUSE RISK SCREEN: Has anyone close to you tried to hurt or harm you recentlyo No Do you feel uncomfortable with anyone in your familyo No Has anyone forced you do things that you didnt want to doo No Electronic Signature(s) Signed: 11/06/2022 5:41:53 PM By: Thayer Dallas Entered By: Thayer Dallas on 11/06/2022 06:11:25 -------------------------------------------------------------------------------- Activities of Daily Living Details Patient Name: Date of Service: [name], [name] 11/06/2022 9:30 A M Medical Record Number: [id number] Patient Account Number: [id number] Date of Birth/Sex: Treating RN: 1940-12-28 (82 y.o. M) Primary Care [name]: Charissa Bash Other Clinician: Referring [name]: Treating [name]/Extender: Debby Bud in Treatment: 0 Activities of Daily Living Items Answer Activities of Daily Living (Please select one for each item) Drive Automobile Completely Able T Medications ake Completely Able Use T elephone Completely Able Care for Appearance Completely Able Use T oilet Completely Able Bath / Shower Completely Able Dress Self Completely Able Feed Self Completely Able Walk Completely Able Get In / Out Bed Completely Able Housework Completely Able Prepare Meals Completely Able Handle Money Completely Able Shop for Self Completely Able Electronic Signature(s) Signed: 11/06/2022 5:41:53 PM By: Thayer Dallas Entered By: Thayer Dallas on 11/06/2022 06:11:15 [name] ([id number]) [id number] Page 2 of 4 -------------------------------------------------------------------------------- Education Screening Details Patient Name: Date of Service: [name], [name] 11/06/2022 9:30 A M Medical Record Number: [id number] Patient Account Number: [id number] Date of Birth/Sex: Treating RN: [birth year] (82 y.o. M) Primary Care Brittian Renaldo: Charissa Bash Other Clinician: Referring [name]: Treating [name]/Extender: Debby Bud in Treatment: 0 Learning Preferences/Education Level/Primary Language Learning Preference: Explanation, Demonstration, Printed Material Highest Education Level: Grade School Preferred Language: English Cognitive Barrier Language Barrier: No Translator Needed: No Memory Deficit: No Emotional Barrier: No Cultural/Religious Beliefs Affecting Medical Care: No Physical Barrier Impaired Vision: Yes Glasses Impaired Hearing: Yes Hearing Aid Decreased Hand dexterity: No Knowledge/Comprehension Knowledge Level: High Comprehension Level: High Ability to understand written instructions: High Ability to understand verbal instructions: High Motivation Anxiety Level: Calm Cooperation: Cooperative Education Importance: Acknowledges Need Interest in Health Problems: Asks Questions Perception: Coherent Willingness to Engage in Self-Management High Activities: Readiness to Engage in Self-Management High Activities: Electronic Signature(s) Signed: 11/06/2022 5:41:53 PM By: Thayer Dallas Entered By: Thayer Dallas on 11/06/2022 06:13:40 -------------------------------------------------------------------------------- Fall Risk Assessment Details Patient Name: Date of Service: [name]. 11/06/2022 9:30 A M Medical Record Number: [id number] Patient Account Number: [id number] Date of Birth/Sex: Treating  RN: 19-Feb-1940 (82 y.o. M) Primary Care [name]: Charissa Bash Other Clinician: Referring [name]: Treating [name]/Extender: Debby Bud in Treatment: 0 Fall Risk Assessment Items Have you had 2 or more falls in the last 12 monthso 0 No Have you had any fall that resulted in injury in the last 12 monthso 0 No [name], [name] ([id number]) [phone] [id number] Page 3 of 4 FALLS RISK SCREEN History of falling - immediate or within 3 months 0 No Secondary diagnosis (Do you have 2 or more medical diagnoseso) 0 No Ambulatory aid None/bed rest/wheelchair/nurse 0 Yes Crutches/cane/walker 0 No Furniture  0 No Intravenous therapy Access/Saline/Heparin Lock 0 No Gait/Transferring Normal/ bed rest/ wheelchair 0 Yes Weak (short steps with or without shuffle, stooped but able to lift head while walking, may seek 0 No support from furniture) Impaired (short steps with shuffle, may have difficulty arising from chair, head down, impaired 0 No balance) Mental Status Oriented to own ability 0 Yes Electronic Signature(s) Signed: 11/06/2022 5:41:53 PM By: Thayer Dallas Entered By: Thayer Dallas on 11/06/2022 06:11:40 -------------------------------------------------------------------------------- Foot Assessment Details Patient Name: Date of Service: [name]. 11/06/2022 9:30 A M Medical Record Number: [id number] Patient Account Number: [id number] Date of Birth/Sex: Treating RN: [birth year] (82 y.o. M) Primary Care [name]: Charissa Bash Other Clinician: Referring [name]: Treating [name]/Extender: Debby Bud in Treatment: 0 Foot Assessment Items Site Locations + = Sensation present, - = Sensation absent, C = Callus, U = Ulcer R = Redness, W = Warmth, M = Maceration, PU = Pre-ulcerative lesion F = Fissure, S = Swelling, D = Dryness Assessment Right: Left: Other Deformity: No No Prior Foot Ulcer:  No No Prior Amputation: No No Charcot Joint: No No Ambulatory Status: Ambulatory Without Help GaitJACARIE, [name] ([id number]) [phone] [id number] Page 4 of 4 Notes no wounds Electronic Signature(s) Signed: 11/06/2022 5:41:53 PM By: Thayer Dallas Entered By: Thayer Dallas on 11/06/2022 06:12:36 -------------------------------------------------------------------------------- Nutrition Risk Screening Details Patient Name: Date of Service: [name], [name] 11/06/2022 9:30 A M Medical Record Number: [id number] Patient Account Number: [id number] Date of Birth/Sex: Treating RN: [birth year] (82 y.o. M) Primary Care Myia Bergh: Charissa Bash Other Clinician: Referring Cymone Yeske: Treating Fey Coghill/Extender: Debby Bud in Treatment: 0 Height (in): 71 Weight (lbs): 180 Body Mass Index (BMI): 25.1 Nutrition Risk Screening Items Score Screening NUTRITION RISK SCREEN: I have an illness or condition that made me change the kind and/or amount of food I eat 0 No I eat fewer than two meals per day 0 No I eat few fruits and vegetables, or milk products 0 No I have three or more drinks of beer, liquor or wine almost every day 0 No I have tooth or mouth problems that make it hard for me to eat 0 No I don't always have enough money to buy the food I need 0 No I eat alone most of the time 0 No I take three or more different prescribed or over-the-counter drugs a day 1 Yes Without wanting to, I have lost or gained 10 pounds in the last six months 0 No I am not always physically able to shop, cook and/or feed myself 0 No Nutrition Protocols Good Risk Protocol 0 No interventions needed Moderate Risk Protocol High Risk Proctocol Risk Level: Good Risk Score: 1 Electronic Signature(s) Signed: 11/06/2022 5:41:53 PM By: Thayer Dallas Entered By: Thayer Dallas on 11/06/2022 06:12:13

## 2022-11-07 NOTE — Progress Notes (Signed)
concerned I think that he would be a good candidate in this regard. He has not been a smoker for at least 42 years maybe a little bit longer. His EKG really did not show anything definitive that was questionable this was October 11, 2021 and his chest x-ray from July 16, 2021 also was negative and appeared to be okay. I really do not see anything physically that makes me think he needs to repeat either of these at this point to be perfectly honest I think that he is in good shape to proceed with hyperbarics for his age she seems to be in extremely good shape in my opinion. Assessment Active Problems ICD-10 Irradiation cystitis with hematuria Nocturia Essential (primary) hypertension Plan Follow-up Appointments: Other: - Will call you to set up to start hyperbaric oxygen therapy once insurance approves. [name], [name] ([id number])  [id number] Page 6 of 9 Hyperbaric Oxygen Therapy: Evaluate for HBO Therapy Indication: - Radiation Cystitis If appropriate for treatment, begin HBOT per protocol: 2.0 ATA for 90 Minutes without Air Breaks T Number of Treatments: - 40 otal Afrin (Oxymetazoline HCL) 0.05% nasal spray - 1 spray in both nostrils daily as needed prior to HBO treatment for difficulty clearing ears 1. I would recommend that we have the patient go ahead and proceed with hyperbaric oxygen therapy due to definitive radiation cystitis with hematuria, urinary frequency, and nocturia. 2. I am going to recommend that we initiate at 2 ATA with no air breaks and this will be a total of 40 treatments. 3. I would suggest as well that we monitor the patient I recommend 1 visit halfway through in order to ensure that he is both seeing improvements and that he is not having any issues here with ongoing complications. Obviously we will be checking on him daily as far as hyperbarics are concerned as well if he has any complications or concerns he can definitely let us know during those daily opportunities. His ears appear to be doing excellent and I did not see anything that would be a complicating factor here but I did do teaching in regard to making sure to clear his ears and hopefully preventing any pressure buildup and barotrauma. Will see him back for a follow-up visit for hyperbarics as soon as we can get this started and then I will plan to see him halfway through to evaluate and make sure that continuation of hyperbarics is still warranted. Electronic Signature([name]) Signed: 11/06/2022 11:01:15 AM By: Allen Derry PA-C Entered By: Allen Derry on 11/06/2022 08:01:15 -------------------------------------------------------------------------------- HxROS Details Patient Name: Date of Service: [name]. 11/06/2022 9:30 A M Medical Record Number: [id number] Patient Account Number: [id number] Date  of Birth/Sex: Treating RN: 1940-12-12 (82 y.o. [name] Primary Care Provider: Charissa Bash Other Clinician: Referring Provider: Treating Provider/Extender: Debby Bud in Treatment: 0 Information Obtained From Patient Constitutional Symptoms (General Health) Complaints and Symptoms: Negative for: Fatigue; Fever; Chills; Marked Weight Change Medical History: Past Medical History Notes: Full dentures Eyes Complaints and Symptoms: Positive for: Glasses / Contacts Negative for: Dry Eyes; Vision Changes Medical History: Positive for: Glaucoma Negative for: Cataracts; Optic Neuritis Ear/Nose/Mouth/Throat Complaints and Symptoms: Negative for: Chronic sinus problems or rhinitis Medical History: Negative for: Chronic sinus problems/congestion; Middle ear problems Past Medical History Notes: wears hearing aid Respiratory Complaints and Symptoms: Negative for: Chronic or frequent coughs; Shortness of Breath Medical HistoryWINSLOW, [name] ([id number]) [id number] Page 7 of 9 Negative for: Aspiration; Asthma; Chronic Obstructive  Number: [id number] Date of Birth/Sex: Treating RN: [birth year] (82 y.o. M) Primary Care Provider: Charissa Bash Other Clinician: Referring Provider: Treating Provider/Extender: Debby Bud in Treatment: 0 Active Problems ICD-10 Encounter Code Description Active Date MDM Diagnosis N30.41 Irradiation cystitis with hematuria 11/06/2022 No Yes R35.1 Nocturia 11/06/2022 No Yes I10 Essential (primary) hypertension 11/06/2022 No Yes Inactive Problems Resolved Problems Electronic Signature([name]) Signed: 11/06/2022 9:47:31 AM By: Allen Derry PA-C Entered By: Allen Derry on 11/06/2022 06:47:31 -------------------------------------------------------------------------------- Progress Note Details Patient Name: Date of Service: [name]. 11/06/2022 9:30 A M Medical Record Number: [id number] Patient Account Number: [id number] Date of Birth/Sex: Treating RN: [birth year] (82 y.o. M) Primary Care Provider: Charissa Bash Other Clinician: Referring Provider: Treating Provider/Extender: Debby Bud in Treatment: [address] [name], [name] ([id number]) [id number] Page 4 of 9 Subjective Chief Complaint Information obtained from Patient Radiation cystitis History of Present Illness (HPI) 11-06-2022 upon evaluation today patient presents for initial inspection here in our clinic concerning issues that he has been having with bleeding, nocturia, and urinary frequency following having undergone radiation therapy for prostate cancer. The patient had radiation from 09-05-2021 through 09-10-2021. There was a total of 75 Gy during the course of  treatment. At the completion of treatment it was noted that the patient had no issues warranting further action that were identified which is excellent news. With that being said the patient has had multiple visits with his urologist. The most recent was apparently on October 24, 2022. At that visit it was noted that the patient had a previous infection with Enterococcus which had been successfully treated. And that was on 09-09-2022. On 10-17-2022 he was in the emergency department for a couple of days with worsening symptoms with regard to urinary issues. The culture at that point showed only 10,000 colonies of Enterococcus he was treated with Keflex for 4 times a day and that he was seen due to gross hematuria at the urology office. CT study in July showed that there were no stones or suspicious mass or lesions in the kidneys no obstructive signs there was bladder wall thickening and surrounding inflammation suggestive of cystitis. 4 days prior to the 19th this would have been around the 15th the patient did have issues with gross hematuria with increases in frequency and urgency and dysuria. He has been trying to drink plenty of water but nonetheless this continues to be a back-and-forth issue that was not clearing. Subsequently the patient had a cystoscopy which was ordered for 10-24-2022 and this was positive for some moderate hyperplasia with regard to the prostate with hypervascularity although there was definitive findings of flat patches of radiation cystitis but no areas of urethral tumor which was good news. At that point the urinalysis showed blood and protein but no bacteria noted and there did not appear to be any white blood cells indicative of an infection. Subsequently it was recommended that hyperbaric oxygen therapy would be the ideal thing due to radiation cystitis noted on the cystoscopy. Since that time the patient tells me symptoms got a little bit better and then subsequently  worse 2 days ago he was having quite a bit of blood and frequency along with nocturia. Last night he got up 3 times again this tends to still be an ongoing issue where he is seeing blood in his urine at least every 1 to 2 days sometimes worse than others. Patient does have a history of hypertension but at the same time  concerned I think that he would be a good candidate in this regard. He has not been a smoker for at least 42 years maybe a little bit longer. His EKG really did not show anything definitive that was questionable this was October 11, 2021 and his chest x-ray from July 16, 2021 also was negative and appeared to be okay. I really do not see anything physically that makes me think he needs to repeat either of these at this point to be perfectly honest I think that he is in good shape to proceed with hyperbarics for his age she seems to be in extremely good shape in my opinion. Assessment Active Problems ICD-10 Irradiation cystitis with hematuria Nocturia Essential (primary) hypertension Plan Follow-up Appointments: Other: - Will call you to set up to start hyperbaric oxygen therapy once insurance approves. [name], [name] ([id number])  [id number] Page 6 of 9 Hyperbaric Oxygen Therapy: Evaluate for HBO Therapy Indication: - Radiation Cystitis If appropriate for treatment, begin HBOT per protocol: 2.0 ATA for 90 Minutes without Air Breaks T Number of Treatments: - 40 otal Afrin (Oxymetazoline HCL) 0.05% nasal spray - 1 spray in both nostrils daily as needed prior to HBO treatment for difficulty clearing ears 1. I would recommend that we have the patient go ahead and proceed with hyperbaric oxygen therapy due to definitive radiation cystitis with hematuria, urinary frequency, and nocturia. 2. I am going to recommend that we initiate at 2 ATA with no air breaks and this will be a total of 40 treatments. 3. I would suggest as well that we monitor the patient I recommend 1 visit halfway through in order to ensure that he is both seeing improvements and that he is not having any issues here with ongoing complications. Obviously we will be checking on him daily as far as hyperbarics are concerned as well if he has any complications or concerns he can definitely let us know during those daily opportunities. His ears appear to be doing excellent and I did not see anything that would be a complicating factor here but I did do teaching in regard to making sure to clear his ears and hopefully preventing any pressure buildup and barotrauma. Will see him back for a follow-up visit for hyperbarics as soon as we can get this started and then I will plan to see him halfway through to evaluate and make sure that continuation of hyperbarics is still warranted. Electronic Signature([name]) Signed: 11/06/2022 11:01:15 AM By: Allen Derry PA-C Entered By: Allen Derry on 11/06/2022 08:01:15 -------------------------------------------------------------------------------- HxROS Details Patient Name: Date of Service: [name]. 11/06/2022 9:30 A M Medical Record Number: [id number] Patient Account Number: [id number] Date  of Birth/Sex: Treating RN: 1940-12-12 (82 y.o. [name] Primary Care Provider: Charissa Bash Other Clinician: Referring Provider: Treating Provider/Extender: Debby Bud in Treatment: 0 Information Obtained From Patient Constitutional Symptoms (General Health) Complaints and Symptoms: Negative for: Fatigue; Fever; Chills; Marked Weight Change Medical History: Past Medical History Notes: Full dentures Eyes Complaints and Symptoms: Positive for: Glasses / Contacts Negative for: Dry Eyes; Vision Changes Medical History: Positive for: Glaucoma Negative for: Cataracts; Optic Neuritis Ear/Nose/Mouth/Throat Complaints and Symptoms: Negative for: Chronic sinus problems or rhinitis Medical History: Negative for: Chronic sinus problems/congestion; Middle ear problems Past Medical History Notes: wears hearing aid Respiratory Complaints and Symptoms: Negative for: Chronic or frequent coughs; Shortness of Breath Medical HistoryWINSLOW, [name] ([id number]) [id number] Page 7 of 9 Negative for: Aspiration; Asthma; Chronic Obstructive  Number: [id number] Date of Birth/Sex: Treating RN: [birth year] (82 y.o. M) Primary Care Provider: Charissa Bash Other Clinician: Referring Provider: Treating Provider/Extender: Debby Bud in Treatment: 0 Active Problems ICD-10 Encounter Code Description Active Date MDM Diagnosis N30.41 Irradiation cystitis with hematuria 11/06/2022 No Yes R35.1 Nocturia 11/06/2022 No Yes I10 Essential (primary) hypertension 11/06/2022 No Yes Inactive Problems Resolved Problems Electronic Signature([name]) Signed: 11/06/2022 9:47:31 AM By: Allen Derry PA-C Entered By: Allen Derry on 11/06/2022 06:47:31 -------------------------------------------------------------------------------- Progress Note Details Patient Name: Date of Service: [name]. 11/06/2022 9:30 A M Medical Record Number: [id number] Patient Account Number: [id number] Date of Birth/Sex: Treating RN: [birth year] (82 y.o. M) Primary Care Provider: Charissa Bash Other Clinician: Referring Provider: Treating Provider/Extender: Debby Bud in Treatment: [address] [name], [name] ([id number]) [id number] Page 4 of 9 Subjective Chief Complaint Information obtained from Patient Radiation cystitis History of Present Illness (HPI) 11-06-2022 upon evaluation today patient presents for initial inspection here in our clinic concerning issues that he has been having with bleeding, nocturia, and urinary frequency following having undergone radiation therapy for prostate cancer. The patient had radiation from 09-05-2021 through 09-10-2021. There was a total of 75 Gy during the course of  treatment. At the completion of treatment it was noted that the patient had no issues warranting further action that were identified which is excellent news. With that being said the patient has had multiple visits with his urologist. The most recent was apparently on October 24, 2022. At that visit it was noted that the patient had a previous infection with Enterococcus which had been successfully treated. And that was on 09-09-2022. On 10-17-2022 he was in the emergency department for a couple of days with worsening symptoms with regard to urinary issues. The culture at that point showed only 10,000 colonies of Enterococcus he was treated with Keflex for 4 times a day and that he was seen due to gross hematuria at the urology office. CT study in July showed that there were no stones or suspicious mass or lesions in the kidneys no obstructive signs there was bladder wall thickening and surrounding inflammation suggestive of cystitis. 4 days prior to the 19th this would have been around the 15th the patient did have issues with gross hematuria with increases in frequency and urgency and dysuria. He has been trying to drink plenty of water but nonetheless this continues to be a back-and-forth issue that was not clearing. Subsequently the patient had a cystoscopy which was ordered for 10-24-2022 and this was positive for some moderate hyperplasia with regard to the prostate with hypervascularity although there was definitive findings of flat patches of radiation cystitis but no areas of urethral tumor which was good news. At that point the urinalysis showed blood and protein but no bacteria noted and there did not appear to be any white blood cells indicative of an infection. Subsequently it was recommended that hyperbaric oxygen therapy would be the ideal thing due to radiation cystitis noted on the cystoscopy. Since that time the patient tells me symptoms got a little bit better and then subsequently  worse 2 days ago he was having quite a bit of blood and frequency along with nocturia. Last night he got up 3 times again this tends to still be an ongoing issue where he is seeing blood in his urine at least every 1 to 2 days sometimes worse than others. Patient does have a history of hypertension but at the same time  [name], [name] ([id number]) [id number] Page 1 of 9 Visit Report for 11/06/2022 Chief Complaint Document Details Patient Name: Date of Service: NATANIEL, GASPER 11/06/2022 9:30 A M Medical Record Number: [id number] Patient Account Number: [id number] Date of Birth/Sex: Treating RN: 08-Sep-1940 (82 y.o. M) Primary Care Provider: Charissa Bash Other Clinician: Referring Provider: Treating Provider/Extender: Debby Bud in Treatment: 0 Information Obtained from: Patient Chief Complaint Radiation cystitis Electronic Signature([name]) Signed: 11/06/2022 9:47:47 AM By: Allen Derry PA-C Entered By: Allen Derry on 11/06/2022 06:47:46 -------------------------------------------------------------------------------- HPI Details Patient Name: Date of Service: [name]. 11/06/2022 9:30 A M Medical Record Number: [id number] Patient Account Number: [id number] Date of Birth/Sex: Treating RN: Jan 28, 1941 (82 y.o. M) Primary Care Provider: Charissa Bash Other Clinician: Referring Provider: Treating Provider/Extender: Debby Bud in Treatment: 0 History of Present Illness HPI Description: 11-06-2022 upon evaluation today patient presents for initial inspection here in our clinic concerning issues that he has been having with bleeding, nocturia, and urinary frequency following having undergone radiation therapy for prostate cancer. The patient had radiation from 09-05-2021 through 8- 14-2023. There was a total of 75 Gy during the course of treatment. At the completion of treatment it was noted that the patient had no issues warranting further action that were identified which is excellent news. With that being said the patient has had multiple visits with his urologist. The most recent was apparently on October 24, 2022. At that visit it was noted that the patient had a previous infection with Enterococcus which had been  successfully treated. And that was on 09-09-2022. On 10-17-2022 he was in the emergency department for a couple of days with worsening symptoms with regard to urinary issues. The culture at that point showed only 10,000 colonies of Enterococcus he was treated with Keflex for 4 times a day and that he was seen due to gross hematuria at the urology office. CT study in July showed that there were no stones or suspicious mass or lesions in the kidneys no obstructive signs there was bladder wall thickening and surrounding inflammation suggestive of cystitis. 4 days prior to the 19th this would have been around the 15th the patient did have issues with gross hematuria with increases in frequency and urgency and dysuria. He has been trying to drink plenty of water but nonetheless this continues to be a back-and-forth issue that was not clearing. Subsequently the patient had a cystoscopy which was ordered for 10-24-2022 and this was positive for some moderate hyperplasia with regard to the prostate with hypervascularity although there was definitive findings of flat patches of radiation cystitis but no areas of urethral tumor which was good news. At that point the urinalysis showed blood and protein but no bacteria noted and there did not appear to be any white blood cells indicative of an infection. Subsequently it was recommended that hyperbaric oxygen therapy would be the ideal thing due to radiation cystitis noted on the cystoscopy. Since that time the patient tells me symptoms got a little bit better and then subsequently worse 2 days ago he was having quite a bit of blood and frequency along with nocturia. Last night he got up 3 times again this tends to still be an ongoing issue where he is seeing blood in his urine at least every 1 to 2 days sometimes worse than others. Patient does have a history of hypertension but at the same time has really no other major medical problems that  [name], [name] ([id number]) [id number] Page 1 of 9 Visit Report for 11/06/2022 Chief Complaint Document Details Patient Name: Date of Service: NATANIEL, GASPER 11/06/2022 9:30 A M Medical Record Number: [id number] Patient Account Number: [id number] Date of Birth/Sex: Treating RN: 08-Sep-1940 (82 y.o. M) Primary Care Provider: Charissa Bash Other Clinician: Referring Provider: Treating Provider/Extender: Debby Bud in Treatment: 0 Information Obtained from: Patient Chief Complaint Radiation cystitis Electronic Signature([name]) Signed: 11/06/2022 9:47:47 AM By: Allen Derry PA-C Entered By: Allen Derry on 11/06/2022 06:47:46 -------------------------------------------------------------------------------- HPI Details Patient Name: Date of Service: [name]. 11/06/2022 9:30 A M Medical Record Number: [id number] Patient Account Number: [id number] Date of Birth/Sex: Treating RN: Jan 28, 1941 (82 y.o. M) Primary Care Provider: Charissa Bash Other Clinician: Referring Provider: Treating Provider/Extender: Debby Bud in Treatment: 0 History of Present Illness HPI Description: 11-06-2022 upon evaluation today patient presents for initial inspection here in our clinic concerning issues that he has been having with bleeding, nocturia, and urinary frequency following having undergone radiation therapy for prostate cancer. The patient had radiation from 09-05-2021 through 8- 14-2023. There was a total of 75 Gy during the course of treatment. At the completion of treatment it was noted that the patient had no issues warranting further action that were identified which is excellent news. With that being said the patient has had multiple visits with his urologist. The most recent was apparently on October 24, 2022. At that visit it was noted that the patient had a previous infection with Enterococcus which had been  successfully treated. And that was on 09-09-2022. On 10-17-2022 he was in the emergency department for a couple of days with worsening symptoms with regard to urinary issues. The culture at that point showed only 10,000 colonies of Enterococcus he was treated with Keflex for 4 times a day and that he was seen due to gross hematuria at the urology office. CT study in July showed that there were no stones or suspicious mass or lesions in the kidneys no obstructive signs there was bladder wall thickening and surrounding inflammation suggestive of cystitis. 4 days prior to the 19th this would have been around the 15th the patient did have issues with gross hematuria with increases in frequency and urgency and dysuria. He has been trying to drink plenty of water but nonetheless this continues to be a back-and-forth issue that was not clearing. Subsequently the patient had a cystoscopy which was ordered for 10-24-2022 and this was positive for some moderate hyperplasia with regard to the prostate with hypervascularity although there was definitive findings of flat patches of radiation cystitis but no areas of urethral tumor which was good news. At that point the urinalysis showed blood and protein but no bacteria noted and there did not appear to be any white blood cells indicative of an infection. Subsequently it was recommended that hyperbaric oxygen therapy would be the ideal thing due to radiation cystitis noted on the cystoscopy. Since that time the patient tells me symptoms got a little bit better and then subsequently worse 2 days ago he was having quite a bit of blood and frequency along with nocturia. Last night he got up 3 times again this tends to still be an ongoing issue where he is seeing blood in his urine at least every 1 to 2 days sometimes worse than others. Patient does have a history of hypertension but at the same time has really no other major medical problems that

## 2022-11-11 ENCOUNTER — Encounter (HOSPITAL_BASED_OUTPATIENT_CLINIC_OR_DEPARTMENT_OTHER): Payer: Medicare HMO | Admitting: Internal Medicine

## 2022-11-11 DIAGNOSIS — N401 Enlarged prostate with lower urinary tract symptoms: Secondary | ICD-10-CM | POA: Diagnosis not present

## 2022-11-11 DIAGNOSIS — L598 Other specified disorders of the skin and subcutaneous tissue related to radiation: Secondary | ICD-10-CM | POA: Diagnosis not present

## 2022-11-11 DIAGNOSIS — I10 Essential (primary) hypertension: Secondary | ICD-10-CM | POA: Diagnosis not present

## 2022-11-11 DIAGNOSIS — Z8249 Family history of ischemic heart disease and other diseases of the circulatory system: Secondary | ICD-10-CM | POA: Diagnosis not present

## 2022-11-11 DIAGNOSIS — N3041 Irradiation cystitis with hematuria: Secondary | ICD-10-CM | POA: Diagnosis not present

## 2022-11-11 DIAGNOSIS — R351 Nocturia: Secondary | ICD-10-CM | POA: Diagnosis not present

## 2022-11-11 NOTE — Progress Notes (Signed)
EVERRETT, LACASSE ([id number]) [id number] Page 1 of 2 Visit Report for 11/11/2022 Arrival Information Details Patient Name: Date of Service: [name], [name] 11/11/2022 12:00 PM Medical Record Number: [id number] Patient Account Number: [id number] Date of Birth/Sex: Treating RN: 27-May-1940 (82 y.o. [name], [name] Primary Care Masato Pettie: Charissa Bash Other Clinician: Karl Bales Referring Kacee Koren: Treating Venise Ellingwood/Extender: Morene Antu in Treatment: 0 Visit Information History Since Last Visit All ordered tests and consults were completed: Yes Patient Arrived: Ambulatory Added or deleted any medications: No Arrival Time: 11:53 Any new allergies or adverse reactions: No Accompanied By: grandson Had a fall or experienced change in No Transfer Assistance: None activities of daily living that may affect Patient Identification Verified: Yes risk of falls: Secondary Verification Process Completed: Yes Signs or symptoms of abuse/neglect since last visito No Patient Requires Transmission-Based Precautions: No Hospitalized since last visit: No Patient Has Alerts: No Implantable device outside of the clinic excluding No cellular tissue based products placed in the center since last visit: Pain Present Now: No Electronic Signature(s) Signed: 11/11/2022 4:30:48 PM By: Karl Bales EMT Entered By: Karl Bales on 11/11/2022 13:30:48 -------------------------------------------------------------------------------- Encounter Discharge Information Details Patient Name: Date of Service: [name] Blitz CIE S. 11/11/2022 12:00 PM Medical Record Number: [id number] Patient Account Number: [id number] Date of Birth/Sex: Treating RN: September 02, 1940 (82 y.o. [name] Primary Care Louden Houseworth: Charissa Bash Other Clinician: Karl Bales Referring Jaquon Gingerich: Treating Zarek Relph/Extender: Morene Antu in  Treatment: 0 Encounter Discharge Information Items Discharge Condition: Stable Ambulatory Status: Ambulatory Discharge Destination: Home Transportation: Private Auto Accompanied By: Lucila Maine Schedule Follow-up Appointment: Yes Clinical Summary of Care: Electronic Signature(s) Signed: 11/11/2022 4:37:07 PM By: Karl Bales EMT Entered By: Karl Bales on 11/11/2022 13:37:07 [name] ([id number]) [id number] Page 2 of 2 -------------------------------------------------------------------------------- Vitals Details Patient Name: Date of Service: [name], [name] 11/11/2022 12:00 PM Medical Record Number: [id number] Patient Account Number: [id number] Date of Birth/Sex: Treating RN: 1940-05-19 (82 y.o. [name] Primary Care Livian Vanderbeck: Charissa Bash Other Clinician: Karl Bales Referring Ovie Cornelio: Treating Garo Heidelberg/Extender: Morene Antu in Treatment: 0 Vital Signs Time Taken: 12:52 Temperature (F): 97.2 Height (in): 71 Pulse (bpm): 61 Weight (lbs): 180 Respiratory Rate (breaths/min): 18 Body Mass Index (BMI): 25.1 Blood Pressure (mmHg): 145/76 Reference Range: 80 - 120 mg / dl Electronic Signature(s) Signed: 11/11/2022 4:31:13 PM By: Karl Bales EMT Entered By: Karl Bales on 11/11/2022 13:31:13

## 2022-11-11 NOTE — Progress Notes (Addendum)
DAMEL, CERTO ([id number]) [id number] Page 1 of 2 Visit Report for 11/11/2022 HBO Details Patient Name: Date of Service: [name], [name] 11/11/2022 12:00 PM Medical Record Number: [id number] Patient Account Number: [id number] Date of Birth/Sex: Treating RN: 1940-04-29 (82 y.o. [name] Primary Care Zeev Deakins: Charissa Bash Other Clinician: Karl Bales Referring Amiliana Foutz: Treating Naomia Lenderman/Extender: Morene Antu in Treatment: 0 HBO Treatment Course Details Treatment Course Number: 1 Ordering Abreanna Drawdy: Lenda Kelp T Treatments Ordered: otal 40 HBO Treatment Start Date: 11/11/2022 HBO Indication: Late Effect of Radiation HBO Treatment Details Treatment Number: 1 Patient Type: Outpatient Chamber Type: Monoplace Chamber Serial #: [id number] Treatment Protocol: 2.0 ATA with 90 minutes oxygen, and no air breaks Treatment Details Compression Rate Down: 1.0 psi / minute De-Compression Rate Up: 1.0 psi / minute Air breaks and breathing Decompress Decompress Compress Tx Pressure Begins Reached periods Begins Ends (leave unused spaces blank) Chamber Pressure (ATA 1 2 ------2 1 ) Clock Time (24 hr) 13:02 13:12 - - - - - - 14:42 14:52 Treatment Length: 110 (minutes) Treatment Segments: 4 Vital Signs Capillary Blood Glucose Reference Range: 80 - 120 mg / dl HBO Diabetic Blood Glucose Intervention Range: <131 mg/dl or >427 mg/dl Time Vitals Blood Respiratory Capillary Blood Glucose Pulse Action Type: Pulse: Temperature: Taken: Pressure: Rate: Glucose (mg/dl): Meter #: Oximetry (%) Taken: Pre 12:52 145/76 61 18 97.2 Post 14:55 165/69 53 18 979 Treatment Response Treatment Toleration: Well Treatment Completion Status: Treatment Completed without Adverse Event Treatment Notes Today was the patient first treatment. The patient was seen by. Dr. Leanord Hawking before starting treatment. No problems noted  today. Akil Hoos Notes Patient tolerated first treatment well. Pretreatment physical exam showed normal cardiopulmonary exams and normal tympanic membranes bilaterally Physician HBO Attestation: I certify that I supervised this HBO treatment in accordance with Medicare guidelines. A trained emergency response team is readily available per Yes hospital policies and procedures. Continue HBOT as ordered. Yes Electronic Signature(s) Signed: 11/11/2022 4:41:28 PM By: Baltazar Najjar MD Previous Signature: 11/11/2022 4:36:02 PM Version By: Jeanine Luz ([id number]) [id number] Page 2 of 2 Entered By: Baltazar Najjar on 11/11/2022 13:40:50 -------------------------------------------------------------------------------- HBO Safety Checklist Details Patient Name: Date of Service: [name], [name] 11/11/2022 12:00 PM Medical Record Number: [id number] Patient Account Number: [id number] Date of Birth/Sex: Treating RN: 08-05-1940 (82 y.o. [name] Primary Care Jasdeep Dejarnett: Charissa Bash Other Clinician: Karl Bales Referring Clytie Shetley: Treating Belkis Norbeck/Extender: Morene Antu in Treatment: 0 HBO Safety Checklist Items Safety Checklist Consent Form Signed Patient voided / foley secured and emptied When did you last eato 1030 Last dose of injectable or oral agent NA Ostomy pouch emptied and vented if applicable NA All implantable devices assessed, documented and approved NA Intravenous access site secured and place NA Valuables secured Linens and cotton and cotton/polyester blend (less than 51% polyester) Personal oil-based products / skin lotions / body lotions removed Wigs or hairpieces removed NA Smoking or tobacco materials removed Books / newspapers / magazines / loose paper removed Cologne, aftershave, perfume and deodorant removed Jewelry removed (may wrap wedding band) Make-up removed NA Hair care  products removed Battery operated devices (external) removed Heating patches and chemical warmers removed Titanium eyewear removed NA Nail polish cured greater than 10 hours NA Casting material cured greater than 10 hours NA Hearing aids removed NA Loose dentures or partials removed removed by patient Prosthetics have been removed NA Patient demonstrates correct use of air break device (if applicable)  Patient concerns have been addressed Patient grounding bracelet on and cord attached to chamber Specifics for Inpatients (complete in addition to above) Medication sheet sent with patient NA Intravenous medications needed or due during therapy sent with patient NA Drainage tubes (e.g. nasogastric tube or chest tube secured and vented) NA Endotracheal or Tracheotomy tube secured NA Cuff deflated of air and inflated with saline NA Airway suctioned NA Notes safety checklist was done before the treatment was started. Electronic Signature(s) Signed: 11/11/2022 4:32:39 PM By: Karl Bales EMT Entered By: Karl Bales on 11/11/2022 13:32:39

## 2022-11-11 NOTE — Progress Notes (Signed)
QUANTAVIUS, HUMM ([id number]) [id number] Page 1 of 2 Visit Report for 11/11/2022 Problem List Details Patient Name: Date of Service: [name], [name] 11/11/2022 12:00 PM Medical Record Number: [id number] Patient Account Number: [id number] Date of Birth/Sex: Treating RN: 1940-05-24 (82 y.o. [name] Primary Care Provider: Charissa Bash Other Clinician: Karl Bales Referring Provider: Treating Provider/Extender: Morene Antu in Treatment: 0 Active Problems ICD-10 Encounter Code Description Active Date MDM Diagnosis N30.41 Irradiation cystitis with hematuria 11/06/2022 No Yes R35.1 Nocturia 11/06/2022 No Yes I10 Essential (primary) hypertension 11/06/2022 No Yes Inactive Problems Resolved Problems Electronic Signature(s) Signed: 11/11/2022 4:36:25 PM By: Karl Bales EMT Signed: 11/11/2022 4:41:28 PM By: Baltazar Najjar MD Entered By: Karl Bales on 11/11/2022 13:36:24 -------------------------------------------------------------------------------- SuperBill Details Patient Name: Date of Service: [name] 11/11/2022 Medical Record Number: [id number] Patient Account Number: [id number] Date of Birth/Sex: Treating RN: 04-25-1940 (82 y.o. [name] Primary Care Provider: Charissa Bash Other Clinician: Karl Bales Referring Provider: Treating Provider/Extender: Morene Antu in Treatment: 0 Diagnosis Coding ICD-10 Codes Code Description N30.41 Irradiation cystitis with hematuria R35.1 Nocturia I10 Essential (primary) hypertension Facility Procedures [name], [name] ([id number]): CPT4 Code Description Modifier Quantity [id number] G0277-(Facility Use Only) HBOT full body chamber, , 4 ICD-10 Diagnosis Description N30.41 Irradiation cystitis with hematuria [id number] Page 2 of 2: Physician Procedures : CPT4 Code Description  Modifier [id number] [phone] - WC PHYS HYPERBARIC OXYGEN THERAPY ICD-10 Diagnosis Description N30.41 Irradiation cystitis with hematuria Quantity: 1 Electronic Signature(s) Signed: 11/11/2022 4:36:19 PM By: Karl Bales EMT Signed: 11/11/2022 4:41:28 PM By: Baltazar Najjar MD Entered By: Karl Bales on 11/11/2022 13:36:19

## 2022-11-12 ENCOUNTER — Encounter (HOSPITAL_BASED_OUTPATIENT_CLINIC_OR_DEPARTMENT_OTHER): Payer: Medicare HMO | Admitting: Internal Medicine

## 2022-11-12 DIAGNOSIS — L598 Other specified disorders of the skin and subcutaneous tissue related to radiation: Secondary | ICD-10-CM | POA: Diagnosis not present

## 2022-11-12 DIAGNOSIS — I10 Essential (primary) hypertension: Secondary | ICD-10-CM | POA: Diagnosis not present

## 2022-11-12 DIAGNOSIS — Z8249 Family history of ischemic heart disease and other diseases of the circulatory system: Secondary | ICD-10-CM | POA: Diagnosis not present

## 2022-11-12 DIAGNOSIS — N3041 Irradiation cystitis with hematuria: Secondary | ICD-10-CM | POA: Diagnosis not present

## 2022-11-12 DIAGNOSIS — R351 Nocturia: Secondary | ICD-10-CM | POA: Diagnosis not present

## 2022-11-12 DIAGNOSIS — N401 Enlarged prostate with lower urinary tract symptoms: Secondary | ICD-10-CM | POA: Diagnosis not present

## 2022-11-12 NOTE — Progress Notes (Signed)
[name] ([id number]) [id number] Page 1 of 2 Visit Report for 11/12/2022 Arrival Information Details Patient Name: Date of Service: [name], [name] 11/12/2022 12:00 PM Medical Record Number: [id number] Patient Account Number: [id number] Date of Birth/Sex: Treating RN: 1940-07-14 (82 y.o. [name], [name] Primary Care Jessabelle Markiewicz: Charissa Bash Other Clinician: Referring Wray Goehring: Treating Anakaren Campion/Extender: Morene Antu in Treatment: 0 Visit Information History Since Last Visit Added or deleted any medications: No Patient Arrived: Ambulatory Any new allergies or adverse reactions: No Arrival Time: 11:55 Had a fall or experienced change in No Accompanied By: wife activities of daily living that may affect Transfer Assistance: None risk of falls: Patient Identification Verified: Yes Signs or symptoms of abuse/neglect since last visito No Secondary Verification Process Completed: Yes Hospitalized since last visit: No Patient Requires Transmission-Based Precautions: No Implantable device outside of the clinic excluding No Patient Has Alerts: No cellular tissue based products placed in the center since last visit: Pain Present Now: No Electronic Signature(s) Signed: 11/12/2022 1:56:51 PM By: Fonnie Mu RN Entered By: Fonnie Mu on 11/12/2022 10:56:51 -------------------------------------------------------------------------------- Encounter Discharge Information Details Patient Name: Date of Service: [name] Blitz CIE S. 11/12/2022 12:00 PM Medical Record Number: [id number] Patient Account Number: [id number] Date of Birth/Sex: Treating RN: 1940/04/15 (82 y.o. [name], [name] Primary Care Asia Dusenbury: Charissa Bash Other Clinician: Referring Quatisha Zylka: Treating Areanna Gengler/Extender: Morene Antu in Treatment: 0 Encounter Discharge Information Items Discharge Condition:  Stable Ambulatory Status: Ambulatory Discharge Destination: Home Transportation: Private Auto Accompanied By: self Schedule Follow-up Appointment: Yes Clinical Summary of Care: Patient Declined Electronic Signature(s) Signed: 11/12/2022 2:22:27 PM By: Fonnie Mu RN Entered By: Fonnie Mu on 11/12/2022 11:22:27 [name] ([id number]) [id number] Page 2 of 2 -------------------------------------------------------------------------------- Patient/Caregiver Education Details Patient Name: Date of Service: [name] 10/15/2024andnbsp12:00 PM Medical Record Number: [id number] Patient Account Number: [id number] Date of Birth/Gender: Treating RN: [birth year] (82 y.o. Lucious Groves Primary Care Physician: Charissa Bash Other Clinician: Referring Physician: Treating Physician/Extender: Morene Antu in Treatment: 0 Education Assessment Education Provided To: Patient Education Topics Provided Hyperbaric Oxygenation: Methods: Explain/Verbal Responses: Reinforcements needed, State content correctly Electronic Signature(s) Unsigned Entered By: Fonnie Mu on 11/12/2022 10:59:50 -------------------------------------------------------------------------------- Vitals Details Patient Name: Date of Service: [name]. 11/12/2022 12:00 PM Medical Record Number: [id number] Patient Account Number: [id number] Date of Birth/Sex: Treating RN: 15-Mar-1940 (82 y.o. [name], [name] Primary Care Sophiana Milanese: Charissa Bash Other Clinician: Referring Rudolph Dobler: Treating Kazden Largo/Extender: Morene Antu in Treatment: 0 Vital Signs Time Taken: 11:55 Temperature (F): 97.5 Height (in): 71 Pulse (bpm): 55 Weight (lbs): 180 Respiratory Rate (breaths/min): 17 Body Mass Index (BMI): 25.1 Blood Pressure (mmHg): 137/65 Reference Range: 80 - 120 mg / dl Electronic  Signature(s) Signed: 11/12/2022 1:57:26 PM By: Fonnie Mu RN Entered By: Fonnie Mu on 11/12/2022 10:57:26

## 2022-11-12 NOTE — Progress Notes (Addendum)
ZYRON, DELISI ([id number]) [id number] Page 1 of 2 Visit Report for 11/12/2022 HBO Details Patient Name: Date of Service: [name], [name] 11/12/2022 12:00 PM Medical Record Number: [id number] Patient Account Number: [id number] Date of Birth/Sex: Treating RN: 23-Mar-1940 (82 y.o. Charlean Merl, Lauren Primary Care Daizee Firmin: Charissa Bash Other Clinician: Referring Najee Manninen: Treating Dashawn Golda/Extender: Morene Antu in Treatment: 0 HBO Treatment Course Details Treatment Course Number: 1 Ordering Mica Releford: Lenda Kelp T Treatments Ordered: otal 40 HBO Treatment Start Date: 11/11/2022 HBO Indication: Late Effect of Radiation HBO Treatment Details Treatment Number: 2 Patient Type: Outpatient Chamber Type: Monoplace Chamber Serial #: [id number] Treatment Protocol: 2.0 ATA with 90 minutes oxygen, and no air breaks Treatment Details Compression Rate Down: 1.0 psi / minute De-Compression Rate Up: 1.0 psi / minute Air breaks and breathing Decompress Decompress Compress Tx Pressure Begins Reached periods Begins Ends (leave unused spaces blank) Chamber Pressure (ATA 1 2 ------2 1 ) Clock Time (24 hr) 12:20 12:30 - - - - - - 14:00 14:15 Treatment Length: 115 (minutes) Treatment Segments: 4 Vital Signs Capillary Blood Glucose Reference Range: 80 - 120 mg / dl HBO Diabetic Blood Glucose Intervention Range: <131 mg/dl or >606 mg/dl Time Vitals Blood Respiratory Capillary Blood Glucose Pulse Action Type: Pulse: Temperature: Taken: Pressure: Rate: Glucose (mg/dl): Meter #: Oximetry (%) Taken: Pre 11:55 137/65 55 17 97.5 Post 14:15 154/75 55 17 97.5 Treatment Response Treatment Toleration: Well Treatment Completion Status: Treatment Completed without Adverse Event Marianela Mandrell Notes No concern with treatment given Physician HBO Attestation: I certify that I supervised this HBO treatment in accordance with Medicare guidelines.  A trained emergency response team is readily available per Yes hospital policies and procedures. Continue HBOT as ordered. Yes Electronic Signature(s) Signed: 11/12/2022 4:40:46 PM By: Baltazar Najjar MD Previous Signature: 11/12/2022 2:21:37 PM Version By: Fonnie Mu RN Previous Signature: 11/12/2022 1:59:25 PM Version By: Fonnie Mu RN Entered By: Baltazar Najjar on 11/12/2022 13:22:11 ZADIEN, ISENSEE ([id number]) [id number] Page 2 of 2 -------------------------------------------------------------------------------- HBO Safety Checklist Details Patient Name: Date of Service: [name], [name] 11/12/2022 12:00 PM Medical Record Number: [id number] Patient Account Number: [id number] Date of Birth/Sex: Treating RN: 04-02-1940 (82 y.o. Charlean Merl, Lauren Primary Care Aalina Brege: Charissa Bash Other Clinician: Referring Tasman Zapata: Treating Earnie Rockhold/Extender: Morene Antu in Treatment: 0 HBO Safety Checklist Items Safety Checklist Consent Form Signed Patient voided / foley secured and emptied When did you last eato 0800-bacon egg cheese biscuit Last dose of injectable or oral agent n/a Ostomy pouch emptied and vented if applicable NA All implantable devices assessed, documented and approved NA Intravenous access site secured and place NA Valuables secured Linens and cotton and cotton/polyester blend (less than 51% polyester) Personal oil-based products / skin lotions / body lotions removed NA Wigs or hairpieces removed NA Smoking or tobacco materials removed NA Books / newspapers / magazines / loose paper removed NA Cologne, aftershave, perfume and deodorant removed NA Jewelry removed (may wrap wedding band) NA Make-up removed NA Hair care products removed NA Battery operated devices (external) removed NA Heating patches and chemical warmers removed NA Titanium eyewear removed NA Nail polish cured  greater than 10 hours NA Casting material cured greater than 10 hours NA Hearing aids removed NA Loose dentures or partials removed NA Prosthetics have been removed NA Patient demonstrates correct use of air break device (if applicable) Patient concerns have been addressed Patient grounding bracelet on and cord attached to chamber Specifics for Inpatients (complete in  addition to above) Medication sheet sent with patient Intravenous medications needed or due during therapy sent with patient Drainage tubes (e.g. nasogastric tube or chest tube secured and vented) Endotracheal or Tracheotomy tube secured Cuff deflated of air and inflated with saline Airway suctioned Electronic Signature(s) Signed: 11/12/2022 1:58:26 PM By: Fonnie Mu RN Entered By: Fonnie Mu on 11/12/2022 10:58:26

## 2022-11-12 NOTE — Progress Notes (Signed)
NIKLAUS, MAMARIL ([id number]) [id number] Page 1 of 1 Visit Report for 11/12/2022 SuperBill Details Patient Name: Date of Service: [name], [name] 11/12/2022 Medical Record Number: [id number] Patient Account Number: [id number] Date of Birth/Sex: Treating RN: 1940/06/05 (82 y.o. Charlean Merl, Lauren Primary Care Provider: Charissa Bash Other Clinician: Referring Provider: Treating Provider/Extender: Morene Antu in Treatment: 0 Diagnosis Coding ICD-10 Codes Code Description N30.41 Irradiation cystitis with hematuria R35.1 Nocturia I10 Essential (primary) hypertension Facility Procedures CPT4 Code Description Modifier Quantity [id number] G0277-(Facility Use Only) HBOT full body chamber, , 4 Physician Procedures Quantity CPT4 Code Description Modifier [id number] - WC PHYS HYPERBARIC OXYGEN THERAPY 1 ICD-10 Diagnosis Description N30.41 Irradiation cystitis with hematuria R35.1 Nocturia Electronic Signature(s) Signed: 11/12/2022 2:21:54 PM By: Fonnie Mu RN Signed: 11/12/2022 4:40:46 PM By: Baltazar Najjar MD Entered By: Fonnie Mu on 11/12/2022 11:21:54

## 2022-11-13 ENCOUNTER — Encounter (HOSPITAL_BASED_OUTPATIENT_CLINIC_OR_DEPARTMENT_OTHER): Payer: Medicare HMO | Admitting: Physician Assistant

## 2022-11-13 DIAGNOSIS — R351 Nocturia: Secondary | ICD-10-CM | POA: Diagnosis not present

## 2022-11-13 DIAGNOSIS — L598 Other specified disorders of the skin and subcutaneous tissue related to radiation: Secondary | ICD-10-CM | POA: Diagnosis not present

## 2022-11-13 DIAGNOSIS — N3041 Irradiation cystitis with hematuria: Secondary | ICD-10-CM | POA: Diagnosis not present

## 2022-11-13 DIAGNOSIS — I10 Essential (primary) hypertension: Secondary | ICD-10-CM | POA: Diagnosis not present

## 2022-11-13 DIAGNOSIS — N401 Enlarged prostate with lower urinary tract symptoms: Secondary | ICD-10-CM | POA: Diagnosis not present

## 2022-11-13 DIAGNOSIS — Z8249 Family history of ischemic heart disease and other diseases of the circulatory system: Secondary | ICD-10-CM | POA: Diagnosis not present

## 2022-11-13 NOTE — Progress Notes (Signed)
[name], [name] ([id number]) [id number] Page 1 of 2 Visit Report for 11/13/2022 HBO Details Patient Name: Date of Service: [name], [name] 11/13/2022 12:00 PM Medical Record Number: [id number] Patient Account Number: [id number] Date of Birth/Sex: Treating RN: 01/01/1941 (82 y.o. [name], Millard.Loa Primary Care Gjon Letarte: Charissa Bash Other Clinician: Referring Diar Berkel: Treating Lavora Brisbon/Extender: Debby Bud in Treatment: 1 HBO Treatment Course Details Treatment Course Number: 1 Ordering Reuben Knoblock: Lenda Kelp T Treatments Ordered: otal 40 HBO Treatment Start Date: 11/11/2022 HBO Indication: Late Effect of Radiation HBO Treatment Details Treatment Number: 3 Patient Type: Outpatient Chamber Type: Monoplace Chamber Serial #: [id number] Treatment Protocol: 2.0 ATA with 90 minutes oxygen, and no air breaks Treatment Details Compression Rate Down: 1.5 psi / minute De-Compression Rate Up: 1.5 psi / minute Air breaks and breathing Decompress Decompress Compress Tx Pressure Begins Reached periods Begins Ends (leave unused spaces blank) Chamber Pressure (ATA 1 2 ------2 1 ) Clock Time (24 hr) 12:02 12:16 - - - - - - 13:46 13:55 Treatment Length: 113 (minutes) Treatment Segments: 4 Vital Signs Capillary Blood Glucose Reference Range: 80 - 120 mg / dl HBO Diabetic Blood Glucose Intervention Range: <131 mg/dl or >782 mg/dl Time Vitals Blood Respiratory Capillary Blood Glucose Pulse Action Type: Pulse: Temperature: Taken: Pressure: Rate: Glucose (mg/dl): Meter #: Oximetry (%) Taken: Pre 11:50 138/67 57 16 98.1 Post 13:56 150/74 50 16 97.2 Treatment Response Treatment Toleration: Well Treatment Completion Status: Treatment Completed without Adverse Event Electronic Signature(s) Signed: 11/13/2022 3:07:10 PM By: Shawn Stall RN, BSN Signed: 11/13/2022 4:26:00 PM By: Allen Derry PA-C Entered By: Shawn Stall on  11/13/2022 14:19:06 -------------------------------------------------------------------------------- HBO Safety Checklist Details Patient Name: Date of Service: [name]. 11/13/2022 12:00 PM [name] ([id number]) [id number] Page 2 of 2 Medical Record Number: [id number] Patient Account Number: [id number] Date of Birth/Sex: Treating RN: [birth year] (82 y.o. [name], Millard.Loa Primary Care Ayleen Mckinstry: Charissa Bash Other Clinician: Referring Kahliya Fraleigh: Treating Nilaya Bouie/Extender: Debby Bud in Treatment: 1 HBO Safety Checklist Items Safety Checklist Consent Form Signed Patient voided / foley secured and emptied When did you last eato lunch- frozen dinner Last dose of injectable or oral agent n/a Ostomy pouch emptied and vented if applicable NA All implantable devices assessed, documented and approved NA Intravenous access site secured and place NA Valuables secured Linens and cotton and cotton/polyester blend (less than 51% polyester) Personal oil-based products / skin lotions / body lotions removed NA Wigs or hairpieces removed NA Smoking or tobacco materials removed NA Books / newspapers / magazines / loose paper removed NA Cologne, aftershave, perfume and deodorant removed Jewelry removed (may wrap wedding band) Make-up removed NA Hair care products removed NA Battery operated devices (external) removed NA Heating patches and chemical warmers removed NA Titanium eyewear removed NA Nail polish cured greater than 10 hours NA Casting material cured greater than 10 hours NA Hearing aids removed Loose dentures or partials removed Prosthetics have been removed NA Patient demonstrates correct use of air break device (if applicable) Patient concerns have been addressed Patient grounding bracelet on and cord attached to chamber Specifics for Inpatients (complete in addition to above) Medication sheet sent  with patient Intravenous medications needed or due during therapy sent with patient Drainage tubes (e.g. nasogastric tube or chest tube secured and vented) Endotracheal or Tracheotomy tube secured Cuff deflated of air and inflated with saline Airway suctioned Electronic Signature(s) Signed: 11/13/2022 3:07:10 PM By: Shawn Stall RN, BSN Entered  By: Shawn Stall on 11/13/2022 13:57:05

## 2022-11-13 NOTE — Progress Notes (Signed)
ANDY, ALLENDE ([id number]) [id number] Page 1 of 1 Visit Report for 11/13/2022 SuperBill Details Patient Name: Date of Service: ORLO, BRICKLE 11/13/2022 Medical Record Number: [id number] Patient Account Number: [id number] Date of Birth/Sex: Treating RN: 1940-07-27 (82 y.o. [name] Primary Care Provider: Charissa Bash Other Clinician: Referring Provider: Treating Provider/Extender: Debby Bud in Treatment: 1 Diagnosis Coding ICD-10 Codes Code Description N30.41 Irradiation cystitis with hematuria R35.1 Nocturia I10 Essential (primary) hypertension Facility Procedures CPT4 Code Description Modifier Quantity [id number] G0277-(Facility Use Only) HBOT full body chamber, , 4 Physician Procedures Quantity CPT4 Code Description Modifier [id number] - WC PHYS HYPERBARIC OXYGEN THERAPY 1 ICD-10 Diagnosis Description N30.41 Irradiation cystitis with hematuria Electronic Signature(s) Signed: 11/13/2022 3:07:10 PM By: Shawn Stall RN, BSN Signed: 11/13/2022 4:26:00 PM By: Allen Derry PA-C Entered By: Shawn Stall on 11/13/2022 14:19:25

## 2022-11-13 NOTE — Progress Notes (Signed)
[name], [name] ([id number]) [id number] Page 1 of 2 Visit Report for 11/13/2022 Arrival Information Details Patient Name: Date of Service: [name], [name] 11/13/2022 12:00 PM Medical Record Number: [id number] Patient Account Number: [id number] Date of Birth/Sex: Treating RN: 12/15/1940 (82 y.o. [name], Millard.Loa Primary Care [name]: [name] Other Clinician: Referring [name]: Treating [name]/Extender: [name] in Treatment: 1 Visit Information History Since Last Visit Added or deleted any medications: No Patient Arrived: Ambulatory Any new allergies or adverse reactions: No Arrival Time: 11:50 Had a fall or experienced change in No Accompanied By: son activities of daily living that may affect Transfer Assistance: None risk of falls: Patient Identification Verified: Yes Signs or symptoms of abuse/neglect since last visito No Secondary Verification Process Completed: Yes Hospitalized since last visit: No Patient Requires Transmission-Based Precautions: No Implantable device outside of the clinic excluding No Patient Has Alerts: No cellular tissue based products placed in the center since last visit: Pain Present Now: No Electronic Signature(s) Signed: 11/13/2022 3:07:10 PM By: [name] Stall RN, BSN Entered By: [name] on 11/13/2022 13:55:19 -------------------------------------------------------------------------------- Encounter Discharge Information Details Patient Name: Date of Service: [name] Blitz CIE S. 11/13/2022 12:00 PM Medical Record Number: [id number] Patient Account Number: [id number] Date of Birth/Sex: Treating RN: 1940-12-04 (82 y.o. [name] Primary Care [name]: [name] Other Clinician: Referring [name]: Treating [name]/Extender: [name] in Treatment: 1 Encounter Discharge Information Items Discharge Condition: Stable Ambulatory  Status: Ambulatory Discharge Destination: Home Transportation: Private Auto Accompanied By: self Schedule Follow-up Appointment: Yes Clinical Summary of Care: Electronic Signature(s) Signed: 11/13/2022 3:07:10 PM By: [name] Stall RN, BSN Entered By: [name] on 11/13/2022 14:21:05 [name] ([id number]) [id number] Page 2 of 2 -------------------------------------------------------------------------------- Vitals Details Patient Name: Date of Service: [name], [name] 11/13/2022 12:00 PM Medical Record Number: [id number] Patient Account Number: [id number] Date of Birth/Sex: Treating RN: January 22, 1941 (82 y.o. [name] Primary Care [name]: [name] Other Clinician: Referring [name]: Treating [name]/Extender: [name] in Treatment: 1 Vital Signs Time Taken: 11:50 Temperature (F): 98.1 Height (in): 71 Pulse (bpm): 57 Weight (lbs): 180 Respiratory Rate (breaths/min): 16 Body Mass Index (BMI): 25.1 Blood Pressure (mmHg): 138/67 Reference Range: 80 - 120 mg / dl Electronic Signature(s) Signed: 11/13/2022 3:07:10 PM By: [name] Stall RN, BSN Entered By: [name] on 11/13/2022 13:55:43

## 2022-11-14 ENCOUNTER — Encounter (HOSPITAL_BASED_OUTPATIENT_CLINIC_OR_DEPARTMENT_OTHER): Payer: Medicare HMO | Admitting: Internal Medicine

## 2022-11-14 DIAGNOSIS — N3041 Irradiation cystitis with hematuria: Secondary | ICD-10-CM | POA: Diagnosis not present

## 2022-11-14 DIAGNOSIS — N401 Enlarged prostate with lower urinary tract symptoms: Secondary | ICD-10-CM | POA: Diagnosis not present

## 2022-11-14 DIAGNOSIS — R351 Nocturia: Secondary | ICD-10-CM | POA: Diagnosis not present

## 2022-11-14 DIAGNOSIS — L598 Other specified disorders of the skin and subcutaneous tissue related to radiation: Secondary | ICD-10-CM | POA: Diagnosis not present

## 2022-11-14 DIAGNOSIS — I10 Essential (primary) hypertension: Secondary | ICD-10-CM | POA: Diagnosis not present

## 2022-11-14 DIAGNOSIS — Z8249 Family history of ischemic heart disease and other diseases of the circulatory system: Secondary | ICD-10-CM | POA: Diagnosis not present

## 2022-11-14 NOTE — Progress Notes (Addendum)
EMPEROR, HIRTE ([id number]) [id number] Page 1 of 2 Visit Report for 11/14/2022 HBO Details Patient Name: Date of Service: [name], [name] 11/14/2022 12:00 PM Medical Record Number: [id number] Patient Account Number: [id number] Date of Birth/Sex: Treating RN: Jan 22, 1941 (82 y.o. [name] Primary Care Mckennah Kretchmer: Charissa Bash Other Clinician: Karl Bales Referring Devina Bezold: Treating Delio Slates/Extender: Morene Antu in Treatment: 1 HBO Treatment Course Details Treatment Course Number: 1 Ordering Chaun Uemura: Lenda Kelp T Treatments Ordered: otal 40 HBO Treatment Start Date: 11/11/2022 HBO Indication: Late Effect of Radiation HBO Treatment Details Treatment Number: 4 Patient Type: Outpatient Chamber Type: Monoplace Chamber Serial #: [id number] Treatment Protocol: 2.0 ATA with 90 minutes oxygen, and no air breaks Treatment Details Compression Rate Down: 1.5 psi / minute De-Compression Rate Up: 1.5 psi / minute Air breaks and breathing Decompress Decompress Compress Tx Pressure Begins Reached periods Begins Ends (leave unused spaces blank) Chamber Pressure (ATA 1 2 ------2 1 ) Clock Time (24 hr) 12:40 12:51 - - - - - - 14:42 14:32 Treatment Length: 112 (minutes) Treatment Segments: 4 Vital Signs Capillary Blood Glucose Reference Range: 80 - 120 mg / dl HBO Diabetic Blood Glucose Intervention Range: <131 mg/dl or >324 mg/dl Time Vitals Blood Respiratory Capillary Blood Glucose Pulse Action Type: Pulse: Temperature: Taken: Pressure: Rate: Glucose (mg/dl): Meter #: Oximetry (%) Taken: Pre 12:05 131/64 52 18 97.7 Post 14:33 154/89 51 18 97.2 Treatment Response Treatment Toleration: Well Treatment Completion Status: Treatment Completed without Adverse Event Chameka Mcmullen Notes No concerns with treatment given Physician HBO Attestation: I certify that I supervised this HBO treatment in accordance with  Medicare guidelines. A trained emergency response team is readily available per Yes hospital policies and procedures. Continue HBOT as ordered. Yes Electronic Signature(s) Signed: 11/18/2022 6:11:24 PM By: Baltazar Najjar MD Previous Signature: 11/14/2022 3:47:37 PM Version By: Karl Bales EMT Entered By: Baltazar Najjar on 11/14/2022 16:06:30 [name] ([id number]) [id number] Page 2 of 2 -------------------------------------------------------------------------------- HBO Safety Checklist Details Patient Name: Date of Service: [name], [name] 11/14/2022 12:00 PM Medical Record Number: [id number] Patient Account Number: [id number] Date of Birth/Sex: Treating RN: February 16, 1940 (82 y.o. [name] Primary Care Solmon Bohr: Charissa Bash Other Clinician: Karl Bales Referring Candida Vetter: Treating Arrow Tomko/Extender: Morene Antu in Treatment: 1 HBO Safety Checklist Items Safety Checklist Consent Form Signed Patient voided / foley secured and emptied When did you last eato 1045 Last dose of injectable or oral agent NA Ostomy pouch emptied and vented if applicable NA All implantable devices assessed, documented and approved NA Intravenous access site secured and place NA Valuables secured Linens and cotton and cotton/polyester blend (less than 51% polyester) Personal oil-based products / skin lotions / body lotions removed Wigs or hairpieces removed NA Smoking or tobacco materials removed Books / newspapers / magazines / loose paper removed Cologne, aftershave, perfume and deodorant removed Jewelry removed (may wrap wedding band) Make-up removed NA Hair care products removed Battery operated devices (external) removed Heating patches and chemical warmers removed Titanium eyewear removed NA Nail polish cured greater than 10 hours NA Casting material cured greater than 10 hours NA Hearing aids  removed NA Loose dentures or partials removed removed by patient Prosthetics have been removed NA Patient demonstrates correct use of air break device (if applicable) Patient concerns have been addressed Patient grounding bracelet on and cord attached to chamber Specifics for Inpatients (complete in addition to above) Medication sheet sent with patient NA Intravenous medications needed or due during  therapy sent with patient NA Drainage tubes (e.g. nasogastric tube or chest tube secured and vented) NA Endotracheal or Tracheotomy tube secured NA Cuff deflated of air and inflated with saline NA Airway suctioned NA Notes The safety checklist was done before the treatment was started. Electronic Signature(s) Signed: 11/14/2022 3:42:15 PM By: Karl Bales EMT Entered By: Karl Bales on 11/14/2022 15:42:15

## 2022-11-14 NOTE — Progress Notes (Signed)
CORDIE, BUENING ([id number]) [id number] Page 1 of 2 Visit Report for 11/14/2022 Arrival Information Details Patient Name: Date of Service: [name], [name] 11/14/2022 12:00 PM Medical Record Number: [id number] Patient Account Number: [id number] Date of Birth/Sex: Treating RN: January 04, 1941 (82 y.o. [name] Primary Care Kameisha Malicki: Charissa Bash Other Clinician: Karl Bales Referring Mariam Helbert: Treating Klinton Candelas/Extender: Morene Antu in Treatment: 1 Visit Information History Since Last Visit All ordered tests and consults were completed: Yes Patient Arrived: Ambulatory Added or deleted any medications: No Arrival Time: 11:50 Any new allergies or adverse reactions: No Accompanied By: None Had a fall or experienced change in No Transfer Assistance: None activities of daily living that may affect Patient Identification Verified: Yes risk of falls: Secondary Verification Process Completed: Yes Signs or symptoms of abuse/neglect since last visito No Patient Requires Transmission-Based Precautions: No Hospitalized since last visit: No Patient Has Alerts: No Implantable device outside of the clinic excluding No cellular tissue based products placed in the center since last visit: Pain Present Now: No Electronic Signature(s) Signed: 11/14/2022 3:40:47 PM By: Karl Bales EMT Entered By: Karl Bales on 11/14/2022 15:40:46 -------------------------------------------------------------------------------- Encounter Discharge Information Details Patient Name: Date of Service: [name] Blitz CIE S. 11/14/2022 12:00 PM Medical Record Number: [id number] Patient Account Number: [id number] Date of Birth/Sex: Treating RN: 09-Apr-1940 (82 y.o. [name] Primary Care Felice Hope: Charissa Bash Other Clinician: Karl Bales Referring Ac Colan: Treating Tyeshia Cornforth/Extender: Morene Antu in  Treatment: 1 Encounter Discharge Information Items Discharge Condition: Stable Ambulatory Status: Ambulatory Discharge Destination: Home Transportation: Private Auto Accompanied By: None Schedule Follow-up Appointment: Yes Clinical Summary of Care: Electronic Signature(s) Signed: 11/14/2022 3:48:49 PM By: Karl Bales EMT Entered By: Karl Bales on 11/14/2022 15:48:49 [name] ([id number]) [id number] Page 2 of 2 -------------------------------------------------------------------------------- Vitals Details Patient Name: Date of Service: [name], [name] 11/14/2022 12:00 PM Medical Record Number: [id number] Patient Account Number: [id number] Date of Birth/Sex: Treating RN: 1940-09-12 (82 y.o. [name] Primary Care Random Dobrowski: Charissa Bash Other Clinician: Karl Bales Referring Janete Quilling: Treating Kashius Dominic/Extender: Morene Antu in Treatment: 1 Vital Signs Time Taken: 12:05 Temperature (F): 97.7 Height (in): 71 Pulse (bpm): 52 Weight (lbs): 180 Respiratory Rate (breaths/min): 18 Body Mass Index (BMI): 25.1 Blood Pressure (mmHg): 131/64 Reference Range: 80 - 120 mg / dl Electronic Signature(s) Signed: 11/14/2022 3:41:16 PM By: Karl Bales EMT Entered By: Karl Bales on 11/14/2022 15:41:16

## 2022-11-15 ENCOUNTER — Encounter (HOSPITAL_BASED_OUTPATIENT_CLINIC_OR_DEPARTMENT_OTHER): Payer: Medicare HMO | Admitting: General Surgery

## 2022-11-15 DIAGNOSIS — Z8249 Family history of ischemic heart disease and other diseases of the circulatory system: Secondary | ICD-10-CM | POA: Diagnosis not present

## 2022-11-15 DIAGNOSIS — N3041 Irradiation cystitis with hematuria: Secondary | ICD-10-CM | POA: Diagnosis not present

## 2022-11-15 DIAGNOSIS — N401 Enlarged prostate with lower urinary tract symptoms: Secondary | ICD-10-CM | POA: Diagnosis not present

## 2022-11-15 DIAGNOSIS — L598 Other specified disorders of the skin and subcutaneous tissue related to radiation: Secondary | ICD-10-CM | POA: Diagnosis not present

## 2022-11-15 DIAGNOSIS — R351 Nocturia: Secondary | ICD-10-CM | POA: Diagnosis not present

## 2022-11-15 DIAGNOSIS — I10 Essential (primary) hypertension: Secondary | ICD-10-CM | POA: Diagnosis not present

## 2022-11-15 NOTE — Progress Notes (Signed)
KAELUB, BLITCH ([id number]) [id number] Page 1 of 2 Visit Report for 11/15/2022 HBO Details Patient Name: Date of Service: [name], [name] 11/15/2022 10:00 A M Medical Record Number: [id number] Patient Account Number: [id number] Date of Birth/Sex: Treating RN: 1940-04-21 (82 y.o. [name] Primary Care Kaiyu Mirabal: Charissa Bash Other Clinician: Haywood Pao Referring Cambell Rickenbach: Treating Suraiya Dickerson/Extender: Claris Gower in Treatment: 1 HBO Treatment Course Details Treatment Course Number: 1 Ordering Jayna Mulnix: Lenda Kelp T Treatments Ordered: otal 40 HBO Treatment Start Date: 11/11/2022 HBO Indication: Late Effect of Radiation HBO Treatment Details Treatment Number: 5 Patient Type: Outpatient Chamber Type: Monoplace Chamber Serial #: [id number] Treatment Protocol: 2.0 ATA with 90 minutes oxygen, and no air breaks Treatment Details Compression Rate Down: 1.0 psi / minute De-Compression Rate Up: 1.5 psi / minute Air breaks and breathing Decompress Decompress Compress Tx Pressure Begins Reached periods Begins Ends (leave unused spaces blank) Chamber Pressure (ATA 1 2 ------2 1 ) Clock Time (24 hr) 10:25 10:38 - - - - - - 12:08 12:18 Treatment Length: 113 (minutes) Treatment Segments: 4 Vital Signs Capillary Blood Glucose Reference Range: 80 - 120 mg / dl HBO Diabetic Blood Glucose Intervention Range: <131 mg/dl or >308 mg/dl Type: Time Vitals Blood Pulse: Respiratory Temperature: Capillary Blood Glucose Pulse Action Taken: Pressure: Rate: Glucose (mg/dl): Meter #: Oximetry (%) Taken: Pre 09:33 115/63 55 18 96.8 asymptomatic for bradycardia Post 12:25 157/71 52 18 97.2 asymptomatic for bradycardia Treatment Response Treatment Toleration: Well Treatment Completion Status: Treatment Completed without Adverse Event Treatment Notes [name] arrived with normal vital signs with the exception of heart  rate of 55 bpm. He was asymptomatic for bradycardia. He prepared for treatment. After performing a safety check, he was placed in the chamber which was compressed with 100% oxygen at a rate of 1.5 psi/min after confirming normal ear equalization. He tolerated the treatment and subsequent decompression of the chamber at 1.5 psi/min. He denied any issues with ear equalization and/or pain associated with barotrauma. His post-treatment vitals were within normal range with the exception of heart rate of 52 bpm. He denied any symptoms related to bradycardia, saying that he felt good. He was stable upon discharge. Physician HBO Attestation: I certify that I supervised this HBO treatment in accordance with Medicare guidelines. A trained emergency response team is readily available per Yes hospital policies and procedures. Continue HBOT as ordered. Yes Electronic Signature(s) Signed: 11/15/2022 12:53:17 PM By: Haywood Pao CHT EMT BS , , Signed: 11/18/2022 11:01:28 AM By: Duanne Guess MD FACS Previous Signature: 11/15/2022 12:34:36 PM Version By: Duanne Guess MD FACS Previous Signature: 11/15/2022 11:35:11 AM Version By: Haywood Pao CHT EMT BS , , [name] ([id number]) [id number] Page 2 of 2 Previous Signature: 11/15/2022 11:35:11 AM Version By: Haywood Pao CHT EMT BS , , Previous Signature: 11/15/2022 11:34:38 AM Version By: Haywood Pao CHT EMT BS , , Entered By: Haywood Pao on 11/15/2022 09:53:17 -------------------------------------------------------------------------------- HBO Safety Checklist Details Patient Name: Date of Service: [name] Riffle. 11/15/2022 10:00 A M Medical Record Number: [id number] Patient Account Number: [id number] Date of Birth/Sex: Treating RN: 1940/08/06 (82 y.o. [name] Primary Care Jarelis Ehlert: Charissa Bash Other Clinician: Haywood Pao Referring Jetson Pickrel: Treating  Branko Steeves/Extender: Claris Gower in Treatment: 1 HBO Safety Checklist Items Safety Checklist Consent Form Signed Patient voided / foley secured and emptied When did you last eato 0750 - Breakfast Last dose of injectable or oral agent n/a Ostomy pouch emptied  and vented if applicable NA All implantable devices assessed, documented and approved NA Intravenous access site secured and place NA Valuables secured Linens and cotton and cotton/polyester blend (less than 51% polyester) Personal oil-based products / skin lotions / body lotions removed Wigs or hairpieces removed NA Smoking or tobacco materials removed NA Books / newspapers / magazines / loose paper removed Cologne, aftershave, perfume and deodorant removed Jewelry removed (may wrap wedding band) Make-up removed Hair care products removed Battery operated devices (external) removed Heating patches and chemical warmers removed Titanium eyewear removed Nail polish cured greater than 10 hours NA Casting material cured greater than 10 hours NA Hearing aids removed removed Loose dentures or partials removed dentures removed Prosthetics have been removed NA Patient demonstrates correct use of air break device (if applicable) Patient concerns have been addressed Patient grounding bracelet on and cord attached to chamber Specifics for Inpatients (complete in addition to above) Medication sheet sent with patient NA Intravenous medications needed or due during therapy sent with patient NA Drainage tubes (e.g. nasogastric tube or chest tube secured and vented) NA Endotracheal or Tracheotomy tube secured NA Cuff deflated of air and inflated with saline NA Airway suctioned NA Notes Paper version used prior to treatment start. Electronic Signature(s) Signed: 11/15/2022 11:31:35 AM By: Haywood Pao CHT EMT BS , , Entered By: Haywood Pao on 11/15/2022 08:31:34

## 2022-11-15 NOTE — Progress Notes (Addendum)
[name], [name] ([id number]) [id number] Page 1 of 2 Visit Report for 11/15/2022 Arrival Information Details Patient Name: Date of Service: [name], [name] 11/15/2022 10:00 A M Medical Record Number: [id number] Patient Account Number: [id number] Date of Birth/Sex: Treating RN: 1940/09/30 (82 y.o. [name], [name] Primary Care Handy Mcloud: Charissa Bash Other Clinician: Karl Bales Referring Risha Barretta: Treating Brooks Stotz/Extender: Claris Gower in Treatment: 1 Visit Information History Since Last Visit All ordered tests and consults were completed: Yes Patient Arrived: Ambulatory Added or deleted any medications: No Arrival Time: 09:17 Any new allergies or adverse reactions: No Accompanied By: self Had a fall or experienced change in No Transfer Assistance: None activities of daily living that may affect Patient Identification Verified: Yes risk of falls: Secondary Verification Process Completed: Yes Signs or symptoms of abuse/neglect since last visito No Patient Requires Transmission-Based Precautions: No Hospitalized since last visit: No Patient Has Alerts: No Implantable device outside of the clinic excluding No cellular tissue based products placed in the center since last visit: Pain Present Now: No Electronic Signature(s) Signed: 11/15/2022 11:29:09 AM By: Haywood Pao CHT EMT BS , , Entered By: Haywood Pao on 11/15/2022 11:29:09 -------------------------------------------------------------------------------- Encounter Discharge Information Details Patient Name: Date of Service: [name] Riffle. 11/15/2022 10:00 A M Medical Record Number: [id number] Patient Account Number: [id number] Date of Birth/Sex: Treating RN: [birth year] (82 y.o. [name] Primary Care Nikesh Teschner: Charissa Bash Other Clinician: Haywood Pao Referring Brisa Auth: Treating Mikale Silversmith/Extender: Claris Gower in Treatment: 1 Encounter Discharge Information Items Discharge Condition: Stable Ambulatory Status: Ambulatory Discharge Destination: Home Transportation: Private Auto Accompanied By: self Schedule Follow-up Appointment: No Clinical Summary of Care: Electronic Signature(s) Signed: 11/15/2022 12:54:09 PM By: Haywood Pao CHT EMT BS , , Entered By: Haywood Pao on 11/15/2022 12:54:09 [name] ([id number]) [id number] Page 2 of 2 -------------------------------------------------------------------------------- Vitals Details Patient Name: Date of Service: [name], [name] 11/15/2022 10:00 A M Medical Record Number: [id number] Patient Account Number: [id number] Date of Birth/Sex: Treating RN: Sep 18, 1940 (82 y.o. [name] Primary Care Erian Rosengren: Charissa Bash Other Clinician: Karl Bales Referring Shamel Galyean: Treating Jaedin Trumbo/Extender: Claris Gower in Treatment: 1 Vital Signs Time Taken: 09:33 Temperature (F): 96.8 Height (in): 71 Pulse (bpm): 55 Weight (lbs): 180 Respiratory Rate (breaths/min): 18 Body Mass Index (BMI): 25.1 Blood Pressure (mmHg): 115/63 Reference Range: 80 - 120 mg / dl Electronic Signature(s) Signed: 11/15/2022 11:30:01 AM By: Haywood Pao CHT EMT BS , , Entered By: Haywood Pao on 11/15/2022 11:30:01

## 2022-11-18 ENCOUNTER — Encounter (HOSPITAL_BASED_OUTPATIENT_CLINIC_OR_DEPARTMENT_OTHER): Payer: Medicare HMO | Admitting: Internal Medicine

## 2022-11-18 DIAGNOSIS — I10 Essential (primary) hypertension: Secondary | ICD-10-CM | POA: Diagnosis not present

## 2022-11-18 DIAGNOSIS — R351 Nocturia: Secondary | ICD-10-CM | POA: Diagnosis not present

## 2022-11-18 DIAGNOSIS — L598 Other specified disorders of the skin and subcutaneous tissue related to radiation: Secondary | ICD-10-CM | POA: Diagnosis not present

## 2022-11-18 DIAGNOSIS — Z8249 Family history of ischemic heart disease and other diseases of the circulatory system: Secondary | ICD-10-CM | POA: Diagnosis not present

## 2022-11-18 DIAGNOSIS — N3041 Irradiation cystitis with hematuria: Secondary | ICD-10-CM | POA: Diagnosis not present

## 2022-11-18 DIAGNOSIS — N401 Enlarged prostate with lower urinary tract symptoms: Secondary | ICD-10-CM | POA: Diagnosis not present

## 2022-11-18 NOTE — Progress Notes (Signed)
AKIEL, MARES ([id number]) [id number] Page 1 of 2 Visit Report for 11/18/2022 HBO Details Patient Name: Date of Service: [name], [name] 11/18/2022 12:45 PM Medical Record Number: [id number] Patient Account Number: [id number] Date of Birth/Sex: Treating RN: April 17, 1940 (82 y.o. [name] Primary Care Julies Carmickle: Charissa Bash Other Clinician: Karl Bales Referring Mayrene Bastarache: Treating Gauge Winski/Extender: Morene Antu in Treatment: 1 HBO Treatment Course Details Treatment Course Number: 1 Ordering Tywon Niday: Lenda Kelp T Treatments Ordered: otal 40 HBO Treatment Start Date: 11/11/2022 HBO Indication: Late Effect of Radiation HBO Treatment Details Treatment Number: 6 Patient Type: Outpatient Chamber Type: Monoplace Chamber Serial #: [id number] Treatment Protocol: 2.0 ATA with 90 minutes oxygen, and no air breaks Treatment Details Compression Rate Down: 2.0 psi / minute De-Compression Rate Up: 2.0 psi / minute Air breaks and breathing Decompress Decompress Compress Tx Pressure Begins Reached periods Begins Ends (leave unused spaces blank) Chamber Pressure (ATA 1 2 ------2 1 ) Clock Time (24 hr) 13:27 13:35 - - - - - - 15:05 15:14 Treatment Length: 107 (minutes) Treatment Segments: 4 Vital Signs Capillary Blood Glucose Reference Range: 80 - 120 mg / dl HBO Diabetic Blood Glucose Intervention Range: <131 mg/dl or >595 mg/dl Time Vitals Blood Respiratory Capillary Blood Glucose Pulse Action Type: Pulse: Temperature: Taken: Pressure: Rate: Glucose (mg/dl): Meter #: Oximetry (%) Taken: Pre 13:18 131/71 59 18 97.5 Post 15:17 162/93 52 18 97.2 Treatment Response Treatment Toleration: Well Treatment Completion Status: Treatment Completed without Adverse Event Tye Vigo Notes No concerns with treatment given Physician HBO Attestation: I certify that I supervised this HBO treatment in accordance with  Medicare guidelines. A trained emergency response team is readily available per Yes hospital policies and procedures. Continue HBOT as ordered. Yes Electronic Signature(s) Signed: 11/18/2022 6:11:24 PM By: Baltazar Najjar MD Previous Signature: 11/18/2022 4:26:50 PM Version By: Karl Bales EMT Entered By: Baltazar Najjar on 11/18/2022 18:09:38 [name] ([id number]) [id number] Page 2 of 2 -------------------------------------------------------------------------------- HBO Safety Checklist Details Patient Name: Date of Service: [name], [name] 11/18/2022 12:45 PM Medical Record Number: [id number] Patient Account Number: [id number] Date of Birth/Sex: Treating RN: 1940/03/20 (82 y.o. [name] Primary Care Orlyn Odonoghue: Charissa Bash Other Clinician: Karl Bales Referring Jasani Lengel: Treating Asna Muldrow/Extender: Morene Antu in Treatment: 1 HBO Safety Checklist Items Safety Checklist Consent Form Signed Patient voided / foley secured and emptied When did you last eato 1130 Last dose of injectable or oral agent NA Ostomy pouch emptied and vented if applicable NA All implantable devices assessed, documented and approved NA Intravenous access site secured and place NA Valuables secured Linens and cotton and cotton/polyester blend (less than 51% polyester) Personal oil-based products / skin lotions / body lotions removed Wigs or hairpieces removed NA Smoking or tobacco materials removed Books / newspapers / magazines / loose paper removed Cologne, aftershave, perfume and deodorant removed Jewelry removed (may wrap wedding band) Make-up removed NA Hair care products removed Battery operated devices (external) removed Heating patches and chemical warmers removed Titanium eyewear removed NA Nail polish cured greater than 10 hours NA Casting material cured greater than 10 hours NA Hearing aids  removed NA Loose dentures or partials removed removed by patient Prosthetics have been removed NA Patient demonstrates correct use of air break device (if applicable) Patient concerns have been addressed Patient grounding bracelet on and cord attached to chamber Specifics for Inpatients (complete in addition to above) Medication sheet sent with patient NA Intravenous medications needed or due during  therapy sent with patient NA Drainage tubes (e.g. nasogastric tube or chest tube secured and vented) NA Endotracheal or Tracheotomy tube secured NA Cuff deflated of air and inflated with saline NA Airway suctioned NA Notes The safety checklist was done before the treatment was started. Electronic Signature(s) Signed: 11/18/2022 4:25:23 PM By: Karl Bales EMT Entered By: Karl Bales on 11/18/2022 16:25:23

## 2022-11-18 NOTE — Progress Notes (Signed)
JOSEDANIEL, HIDAY ([id number]) [id number] Page 1 of 1 Visit Report for 11/15/2022 SuperBill Details Patient Name: Date of Service: [name], [name] 11/15/2022 Medical Record Number: [id number] Patient Account Number: [id number] Date of Birth/Sex: Treating RN: 1940-08-09 (82 y.o. [name] Primary Care Provider: Charissa Bash Other Clinician: Haywood Pao Referring Provider: Treating Provider/Extender: Claris Gower in Treatment: 1 Diagnosis Coding ICD-10 Codes Code Description N30.41 Irradiation cystitis with hematuria R35.1 Nocturia I10 Essential (primary) hypertension Facility Procedures CPT4 Code Description Modifier Quantity [id number] G0277-(Facility Use Only) HBOT full body chamber, , 4 ICD-10 Diagnosis Description N30.41 Irradiation cystitis with hematuria R35.1 Nocturia Physician Procedures Quantity CPT4 Code Description Modifier [id number] - WC PHYS HYPERBARIC OXYGEN THERAPY 1 ICD-10 Diagnosis Description N30.41 Irradiation cystitis with hematuria R35.1 Nocturia Electronic Signature(s) Signed: 11/15/2022 12:53:41 PM By: Haywood Pao CHT EMT BS , , Signed: 11/18/2022 11:01:28 AM By: Duanne Guess MD FACS Entered By: Haywood Pao on 11/15/2022 09:53:40

## 2022-11-18 NOTE — Progress Notes (Signed)
[name] ([id number]) [id number] Page 1 of 2 Visit Report for 11/18/2022 Arrival Information Details Patient Name: Date of Service: [name], [name] 11/18/2022 12:45 PM Medical Record Number: [id number] Patient Account Number: [id number] Date of Birth/Sex: Treating RN: [birth year] (82 y.o. [name], [name] Primary Care Kobi Mario: Charissa Bash Other Clinician: Karl Bales Referring Weaver Tweed: Treating Asmaa Tirpak/Extender: Morene Antu in Treatment: 1 Visit Information History Since Last Visit All ordered tests and consults were completed: Yes Patient Arrived: Ambulatory Added or deleted any medications: No Arrival Time: 12:37 Any new allergies or adverse reactions: No Accompanied By: None Had a fall or experienced change in No Transfer Assistance: None activities of daily living that may affect Patient Identification Verified: Yes risk of falls: Secondary Verification Process Completed: Yes Signs or symptoms of abuse/neglect since last visito No Patient Requires Transmission-Based Precautions: No Hospitalized since last visit: No Patient Has Alerts: No Implantable device outside of the clinic excluding No cellular tissue based products placed in the center since last visit: Pain Present Now: No Electronic Signature(s) Signed: 11/18/2022 4:24:02 PM By: Karl Bales EMT Entered By: Karl Bales on 11/18/2022 13:24:02 -------------------------------------------------------------------------------- Encounter Discharge Information Details Patient Name: Date of Service: [name] Riffle. 11/18/2022 12:45 PM Medical Record Number: [id number] Patient Account Number: [id number] Date of Birth/Sex: Treating RN: 04-05-1940 (82 y.o. [name] Primary Care Baya Lentz: Charissa Bash Other Clinician: Karl Bales Referring Domani Bakos: Treating Erine Phenix/Extender: Morene Antu in  Treatment: 1 Encounter Discharge Information Items Discharge Condition: Stable Ambulatory Status: Ambulatory Discharge Destination: Home Transportation: Private Auto Accompanied By: None Schedule Follow-up Appointment: Yes Clinical Summary of Care: Electronic Signature(s) Signed: 11/18/2022 4:27:40 PM By: Karl Bales EMT Entered By: Karl Bales on 11/18/2022 13:27:40 [name] ([id number]) [id number] Page 2 of 2 -------------------------------------------------------------------------------- Vitals Details Patient Name: Date of Service: [name] 11/18/2022 12:45 PM Medical Record Number: [id number] Patient Account Number: [id number] Date of Birth/Sex: Treating RN: Nov 01, 1940 (82 y.o. [name] Primary Care Lorita Forinash: Charissa Bash Other Clinician: Karl Bales Referring Ranika Mcniel: Treating Tavon Magnussen/Extender: Morene Antu in Treatment: 1 Vital Signs Time Taken: 13:18 Temperature (F): 97.5 Height (in): 71 Pulse (bpm): 59 Weight (lbs): 180 Respiratory Rate (breaths/min): 18 Body Mass Index (BMI): 25.1 Blood Pressure (mmHg): 131/71 Reference Range: 80 - 120 mg / dl Electronic Signature(s) Signed: 11/18/2022 4:24:30 PM By: Karl Bales EMT Entered By: Karl Bales on 11/18/2022 13:24:30

## 2022-11-19 ENCOUNTER — Encounter (HOSPITAL_BASED_OUTPATIENT_CLINIC_OR_DEPARTMENT_OTHER): Payer: Medicare HMO | Admitting: Internal Medicine

## 2022-11-19 DIAGNOSIS — N3041 Irradiation cystitis with hematuria: Secondary | ICD-10-CM | POA: Diagnosis not present

## 2022-11-19 DIAGNOSIS — R351 Nocturia: Secondary | ICD-10-CM | POA: Diagnosis not present

## 2022-11-19 DIAGNOSIS — L598 Other specified disorders of the skin and subcutaneous tissue related to radiation: Secondary | ICD-10-CM | POA: Diagnosis not present

## 2022-11-19 DIAGNOSIS — N401 Enlarged prostate with lower urinary tract symptoms: Secondary | ICD-10-CM | POA: Diagnosis not present

## 2022-11-19 DIAGNOSIS — Z8249 Family history of ischemic heart disease and other diseases of the circulatory system: Secondary | ICD-10-CM | POA: Diagnosis not present

## 2022-11-19 DIAGNOSIS — I10 Essential (primary) hypertension: Secondary | ICD-10-CM | POA: Diagnosis not present

## 2022-11-19 NOTE — Progress Notes (Addendum)
GERRARD, SCHACHER ([id number]) [id number] Page 1 of 2 Visit Report for 11/19/2022 HBO Details Patient Name: Date of Service: [name], [name] 11/19/2022 12:00 PM Medical Record Number: [id number] Patient Account Number: [id number] Date of Birth/Sex: Treating RN: 08/11/1940 (82 y.o. [name] Primary Care Iram Lundberg: Charissa Bash Other Clinician: Referring Katryna Tschirhart: Treating Cena Bruhn/Extender: Morene Antu in Treatment: 1 HBO Treatment Course Details Treatment Course Number: 1 Ordering Meilin Brosh: Lenda Kelp T Treatments Ordered: otal 40 HBO Treatment Start Date: 11/11/2022 HBO Indication: Late Effect of Radiation HBO Treatment Details Treatment Number: 7 Patient Type: Outpatient Chamber Type: Monoplace Chamber Serial #: [id number] Treatment Protocol: 2.0 ATA with 90 minutes oxygen, and no air breaks Treatment Details Compression Rate Down: 1.5 psi / minute De-Compression Rate Up: 1.5 psi / minute Air breaks and breathing Decompress Decompress Compress Tx Pressure Begins Reached periods Begins Ends (leave unused spaces blank) Chamber Pressure (ATA 1 2 ------2 1 ) Clock Time (24 hr) 12:47 13:01 - - - - - - 14:31 14:40 Treatment Length: 113 (minutes) Treatment Segments: 4 Vital Signs Capillary Blood Glucose Reference Range: 80 - 120 mg / dl HBO Diabetic Blood Glucose Intervention Range: <131 mg/dl or >308 mg/dl Time Vitals Blood Respiratory Capillary Blood Glucose Pulse Action Type: Pulse: Temperature: Taken: Pressure: Rate: Glucose (mg/dl): Meter #: Oximetry (%) Taken: Pre 12:40 128/73 57 18 97.3 Post 14:43 163/94 53 18 97.2 Treatment Response Treatment Toleration: Well Treatment Completion Status: Treatment Completed without Adverse Event Hridaan Bouse Notes No concerns with treatment given Physician HBO Attestation: I certify that I supervised this HBO treatment in accordance with  Medicare guidelines. A trained emergency response team is readily available per Yes hospital policies and procedures. Continue HBOT as ordered. Yes Electronic Signature(s) Signed: 11/19/2022 4:45:38 PM By: Baltazar Najjar MD Previous Signature: 11/19/2022 2:49:47 PM Version By: Karl Bales EMT Entered By: Baltazar Najjar on 11/19/2022 16:43:03 Masato, Zunk Hilbert Bible ([id number]) [id number] Page 2 of 2 -------------------------------------------------------------------------------- HBO Safety Checklist Details Patient Name: Date of Service: [name], [name] 11/19/2022 12:00 PM Medical Record Number: [id number] Patient Account Number: [id number] Date of Birth/Sex: Treating RN: [birth year] (82 y.o. [name] Primary Care Ramon Zanders: Charissa Bash Other Clinician: Haywood Pao Referring Thi Klich: Treating Sedalia Greeson/Extender: Morene Antu in Treatment: 1 HBO Safety Checklist Items Safety Checklist Consent Form Signed Patient voided / foley secured and emptied When did you last eato Lunch Last dose of injectable or oral agent n/a Ostomy pouch emptied and vented if applicable NA All implantable devices assessed, documented and approved NA Intravenous access site secured and place NA Valuables secured Linens and cotton and cotton/polyester blend (less than 51% polyester) Personal oil-based products / skin lotions / body lotions removed Wigs or hairpieces removed NA Smoking or tobacco materials removed NA Books / newspapers / magazines / loose paper removed Cologne, aftershave, perfume and deodorant removed Jewelry removed (may wrap wedding band) Make-up removed NA Hair care products removed Battery operated devices (external) removed Heating patches and chemical warmers removed Titanium eyewear removed Nail polish cured greater than 10 hours NA Casting material cured greater than 10 hours NA Hearing aids  removed NA Loose dentures or partials removed dentures removed Prosthetics have been removed NA Patient demonstrates correct use of air break device (if applicable) Patient concerns have been addressed Patient grounding bracelet on and cord attached to chamber Specifics for Inpatients (complete in addition to above) Medication sheet sent with patient NA Intravenous medications needed or due during therapy sent with  patient NA Drainage tubes (e.g. nasogastric tube or chest tube secured and vented) NA Endotracheal or Tracheotomy tube secured NA Cuff deflated of air and inflated with saline NA Airway suctioned NA Notes Paper version used prior to treatment start. Electronic Signature(s) Signed: 11/19/2022 1:49:31 PM By: Haywood Pao CHT EMT BS , , Entered By: Haywood Pao on 11/19/2022 13:49:31

## 2022-11-19 NOTE — Progress Notes (Signed)
[name], [name] ([id number]) [id number] Page 1 of 2 Visit Report for 11/14/2022 Problem List Details Patient Name: Date of Service: [name], [name] 11/14/2022 12:00 PM Medical Record Number: [id number] Patient Account Number: [id number] Date of Birth/Sex: Treating RN: [name] (82 y.o. 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.41 Irradiation cystitis with hematuria 11/06/2022 No Yes R35.1 Nocturia 11/06/2022 No Yes I10 Essential (primary) hypertension 11/06/2022 No Yes Inactive Problems Resolved Problems Electronic Signature(s) Signed: 11/14/2022 3:48:11 PM By: [name] EMT Signed: 11/18/2022 6:11:24 PM By: [name] Najjar MD Entered By: [name] on 11/14/2022 15:48:11 -------------------------------------------------------------------------------- SuperBill Details Patient Name: Date of Service: [name] 11/14/2022 Medical Record Number: [id number] Patient Account Number: [id number] Date of Birth/Sex: Treating RN: [name], [name] (82 y.o. 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

## 2022-11-19 NOTE — Progress Notes (Signed)
[name], [name] ([id number]) [id number] Page 1 of 2 Visit Report for 11/19/2022 Problem List Details Patient Name: Date of Service: [name], [name] 11/19/2022 12:00 PM Medical Record Number: [id number] Patient Account Number: [id number] Date of Birth/Sex: Treating RN: Oct 23, 1940 (82 y.o. [name] Primary Care Provider: Charissa Bash Other Clinician: Referring Provider: Treating Provider/Extender: Morene Antu in Treatment: 1 Active Problems ICD-10 Encounter Code Description Active Date MDM Diagnosis N30.41 Irradiation cystitis with hematuria 11/06/2022 No Yes R35.1 Nocturia 11/06/2022 No Yes I10 Essential (primary) hypertension 11/06/2022 No Yes Inactive Problems Resolved Problems Electronic Signature(s) Signed: 11/19/2022 2:50:08 PM By: Karl Bales EMT Signed: 11/19/2022 4:45:38 PM By: Baltazar Najjar MD Entered By: Karl Bales on 11/19/2022 14:50:08 -------------------------------------------------------------------------------- SuperBill Details Patient Name: Date of Service: [name] 11/19/2022 Medical Record Number: [id number] Patient Account Number: [id number] Date of Birth/Sex: Treating RN: 07/10/1940 (82 y.o. 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

## 2022-11-19 NOTE — Progress Notes (Signed)
RHANDY, MORELOCK ([id number]) [id number] Page 1 of 2 Visit Report for 11/18/2022 Problem List Details Patient Name: Date of Service: [name], [name] 11/18/2022 12:45 PM Medical Record Number: [id number] Patient Account Number: [id number] Date of Birth/Sex: Treating RN: [birth year] (82 y.o. [name] Primary Care Provider: Charissa Bash Other Clinician: Karl Bales Referring Provider: Treating Provider/Extender: Morene Antu in Treatment: 1 Active Problems ICD-10 Encounter Code Description Active Date MDM Diagnosis N30.41 Irradiation cystitis with hematuria 11/06/2022 No Yes R35.1 Nocturia 11/06/2022 No Yes I10 Essential (primary) hypertension 11/06/2022 No Yes Inactive Problems Resolved Problems Electronic Signature(s) Signed: 11/18/2022 4:27:06 PM By: Karl Bales EMT Signed: 11/18/2022 6:11:24 PM By: Baltazar Najjar MD Entered By: Karl Bales on 11/18/2022 16:27:06 -------------------------------------------------------------------------------- SuperBill Details Patient Name: Date of Service: [name] 11/18/2022 Medical Record Number: [id number] Patient Account Number: [id number] Date of Birth/Sex: Treating RN: Jun 03, 1940 (82 y.o. 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

## 2022-11-19 NOTE — Progress Notes (Addendum)
FADI, HILGERT ([id number]) [id number] Page 1 of 2 Visit Report for 11/19/2022 Arrival Information Details Patient Name: Date of Service: BRENYN, PATCHETT 11/19/2022 12:00 PM Medical Record Number: [id number] Patient Account Number: [id number] Date of Birth/Sex: Treating RN: 1940-11-11 (82 y.o. [name] Primary Care Fronia Depass: Charissa Bash Other Clinician: Haywood Pao Referring Maryanne Huneycutt: Treating Shanik Brookshire/Extender: Morene Antu in Treatment: 1 Visit Information History Since Last Visit All ordered tests and consults were completed: Yes Patient Arrived: Ambulatory Added or deleted any medications: No Arrival Time: 13:44 Any new allergies or adverse reactions: No Accompanied By: self Had a fall or experienced change in No Transfer Assistance: None activities of daily living that may affect Patient Identification Verified: Yes risk of falls: Secondary Verification Process Completed: Yes Signs or symptoms of abuse/neglect since last visito No Patient Requires Transmission-Based Precautions: No Hospitalized since last visit: No Patient Has Alerts: No Implantable device outside of the clinic excluding No cellular tissue based products placed in the center since last visit: Pain Present Now: No Electronic Signature(s) Signed: 11/19/2022 1:47:27 PM By: Haywood Pao CHT EMT BS , , Entered By: Haywood Pao on 11/19/2022 10:47:27 -------------------------------------------------------------------------------- Encounter Discharge Information Details Patient Name: Date of Service: [name] 11/19/2022 12:00 PM Medical Record Number: [id number] Patient Account Number: [id number] Date of Birth/Sex: Treating RN: 12/01/1940 (82 y.o. [name] Primary Care Erhardt Dada: Charissa Bash Other Clinician: Referring Sherline Eberwein: Treating Jonathyn Carothers/Extender: Morene Antu  in Treatment: 1 Encounter Discharge Information Items Discharge Condition: Stable Ambulatory Status: Ambulatory Discharge Destination: Home Transportation: Private Auto Accompanied By: None Schedule Follow-up Appointment: Yes Clinical Summary of Care: Electronic Signature(s) Signed: 11/19/2022 2:50:37 PM By: Karl Bales EMT Entered By: Karl Bales on 11/19/2022 11:50:37 [name] ([id number]) [id number] Page 2 of 2 -------------------------------------------------------------------------------- Vitals Details Patient Name: Date of Service: HESHAM, WOMMACK 11/19/2022 12:00 PM Medical Record Number: [id number] Patient Account Number: [id number] Date of Birth/Sex: Treating RN: August 11, 1940 (82 y.o. [name] Primary Care Imelda Dandridge: Charissa Bash Other Clinician: Haywood Pao Referring Rhodesia Stanger: Treating Kennedey Digilio/Extender: Morene Antu in Treatment: 1 Vital Signs Time Taken: 12:40 Temperature (F): 97.3 Height (in): 71 Pulse (bpm): 57 Weight (lbs): 180 Respiratory Rate (breaths/min): 18 Body Mass Index (BMI): 25.1 Blood Pressure (mmHg): 128/73 Reference Range: 80 - 120 mg / dl Electronic Signature(s) Signed: 11/19/2022 1:48:00 PM By: Haywood Pao CHT EMT BS , , Entered By: Haywood Pao on 11/19/2022 10:48:00

## 2022-11-20 ENCOUNTER — Encounter (HOSPITAL_BASED_OUTPATIENT_CLINIC_OR_DEPARTMENT_OTHER): Payer: Medicare HMO | Admitting: Physician Assistant

## 2022-11-20 DIAGNOSIS — N3041 Irradiation cystitis with hematuria: Secondary | ICD-10-CM | POA: Diagnosis not present

## 2022-11-20 DIAGNOSIS — I10 Essential (primary) hypertension: Secondary | ICD-10-CM | POA: Diagnosis not present

## 2022-11-20 DIAGNOSIS — L598 Other specified disorders of the skin and subcutaneous tissue related to radiation: Secondary | ICD-10-CM | POA: Diagnosis not present

## 2022-11-20 DIAGNOSIS — N401 Enlarged prostate with lower urinary tract symptoms: Secondary | ICD-10-CM | POA: Diagnosis not present

## 2022-11-20 DIAGNOSIS — Z8249 Family history of ischemic heart disease and other diseases of the circulatory system: Secondary | ICD-10-CM | POA: Diagnosis not present

## 2022-11-20 DIAGNOSIS — R351 Nocturia: Secondary | ICD-10-CM | POA: Diagnosis not present

## 2022-11-20 NOTE — Progress Notes (Addendum)
BENECIO, BLOOD ([id number]) [id number] Page 1 of 2 Visit Report for 11/20/2022 [name] Patient Name: Date of Service: [name], [name] 11/20/2022 12:00 PM Medical Record Number: [id number] Patient Account Number: [id number] Date of Birth/Sex: [name]: 11-05-1940 (82 y.o. [name] [name] [name]: [name] [name]: [name] [name]: [name] [name]/Extender: [name] in Treatment: 2 [name] Treatment Course [name] Treatment Course Number: 1 Ordering [name]: [name] T Treatments Ordered: otal 40 [name] Treatment Start Date: 11/11/2022 [name] Indication: Late Effect of Radiation [name] Treatment Number: 8 Patient Type: Outpatient Chamber Type: Monoplace Chamber Serial #: [id number] Treatment Protocol: 2.0 ATA with 90 minutes oxygen, and no air breaks Treatment [name] Compression Rate Down: 1.5 psi / minute De-Compression Rate Up: 2.0 psi / minute Air breaks and breathing Decompress Decompress Compress Tx Pressure Begins Reached periods Begins Ends (leave unused spaces blank) Chamber Pressure (ATA 1 2 ------2 1 ) Clock Time (24 hr) 12:24 12:36 - - - - - - 14:06 14:14 Treatment Length: 110 (minutes) Treatment Segments: 4 Vital Signs Capillary Blood Glucose Reference Range: 80 - 120 mg / dl [name] Diabetic Blood Glucose Intervention Range: <131 mg/dl or >086 mg/dl Type: Time Vitals Blood Pulse: Respiratory Temperature: Capillary Blood Glucose Pulse Action Taken: Pressure: Rate: Glucose (mg/dl): Meter #: Oximetry (%) Taken: Pre 11:59 152/75 50 18 97.2 asymptomatic for bradycardia Post 14:17 161/73 60 18 97.7 100 none per protocol Treatment Response Treatment Toleration: Well Treatment Completion Status: Treatment Completed without Adverse Event Treatment Notes [name] arrived with normal vitals with the exception of heart rate of 50 bpm.  He was asymptomatic for symptoms related to bradycardia. After performing a safety check, patient was placed in the chamber which was compressed with 100% oxygen at a rate of 1.5 psi/min after confirming normal ear equalization. He tolerated the treatment and subsequent decompression of the chamber at a rate of 1.5 psi/min. His post treatment vital signs were within normal range except heart rate of 49 bpm. Manual count of 60 bpm. Patient was asymptomatic for bradycardia. He was stable upon discharge. Electronic Signature(s) Signed: 11/20/2022 2:49:00 PM By: [name] CHT EMT BS , , Signed: 11/20/2022 4:45:44 PM By: [name] Derry PA-C Entered By: [name] on 11/20/2022 11:49:00 [name] ([id number]) [id number] Page 2 of 2 -------------------------------------------------------------------------------- [name] [name] Patient Name: Date of Service: [name], [name] 11/20/2022 12:00 PM Medical Record Number: [id number] Patient Account Number: [id number] Date of Birth/Sex: [name]: [birth year] (82 y.o. [name] [name] Lynett Brasil: [name] [name]: [name] [name]: [name]/Extender: [name] in Treatment: 2 [name] Items Safety [name] Consent Form Signed Patient voided / foley secured and emptied When did you last eato Lunch Last dose of injectable or oral agent n/a Ostomy pouch emptied and vented if applicable NA All implantable devices assessed, documented and approved NA Intravenous access site secured and place NA Valuables secured Linens and cotton and cotton/polyester blend (less than 51% polyester) Personal oil-based products / skin lotions / body lotions removed Wigs or hairpieces removed NA Smoking or tobacco materials removed NA Books / newspapers / magazines / loose paper removed Cologne, aftershave,  perfume and deodorant removed Jewelry removed (may wrap wedding band) Make-up removed NA Hair [name] products removed Battery operated devices (external) removed Heating patches and chemical warmers removed Titanium eyewear removed Nail polish cured greater than 10 hours NA Casting  material cured greater than 10 hours NA Hearing aids removed NA Loose dentures or partials removed dentures removed Prosthetics have been removed NA Patient demonstrates correct use of air break device (if applicable) Patient concerns have been addressed Patient grounding bracelet on and cord attached to chamber Specifics for Inpatients (complete in addition to above) Medication sheet sent with patient NA Intravenous medications needed or due during therapy sent with patient NA Drainage tubes (e.g. nasogastric tube or chest tube secured and vented) NA Endotracheal or Tracheotomy tube secured NA Cuff deflated of air and inflated with saline NA Airway suctioned NA Notes Paper version used prior to treatment start. Electronic Signature(s) Signed: 11/20/2022 2:16:19 PM By: [name] CHT EMT BS , , Entered By: [name] on 11/20/2022 11:16:18

## 2022-11-20 NOTE — Progress Notes (Addendum)
NICKOLIS, YANIK ([id number]) [phone].pdf Page 1 of 2 Visit Report for 11/20/2022 Arrival Information Details Patient Name: Date of Service: [name], [name] 11/20/2022 12:00 PM Medical Record Number: [id number] Patient Account Number: [id number] Date of Birth/Sex: Treating RN: 28-Apr-1940 (82 y.o. [name] Primary Care Shwanda Soltis: Charissa Bash Other Clinician: Haywood Pao Referring Riane Rung: Treating Donna Snooks/Extender: Debby Bud in Treatment: 2 Visit Information History Since Last Visit All ordered tests and consults were completed: Yes Patient Arrived: Ambulatory Added or deleted any medications: No Arrival Time: 11:52 Any new allergies or adverse reactions: No Accompanied By: self Had a fall or experienced change in No Transfer Assistance: None activities of daily living that may affect Patient Identification Verified: Yes risk of falls: Secondary Verification Process Completed: Yes Signs or symptoms of abuse/neglect since last visito No Patient Requires Transmission-Based Precautions: No Hospitalized since last visit: No Patient Has Alerts: No Implantable device outside of the clinic excluding No cellular tissue based products placed in the center since last visit: Pain Present Now: No Electronic Signature(s) Signed: 11/20/2022 2:14:38 PM By: Haywood Pao CHT EMT BS , , Entered By: Haywood Pao on 11/20/2022 11:14:38 -------------------------------------------------------------------------------- Encounter Discharge Information Details Patient Name: Date of Service: [name] Riffle. 11/20/2022 12:00 PM Medical Record Number: [id number] Patient Account Number: [id number] Date of Birth/Sex: Treating RN: 07/08/1940 (82 y.o. [name] Primary Care Izzah Pasqua: Charissa Bash Other Clinician: Haywood Pao Referring Kalai Baca: Treating Davien Malone/Extender: Debby Bud in Treatment: 2 Encounter Discharge Information Items Discharge Condition: Stable Ambulatory Status: Ambulatory Discharge Destination: Home Transportation: Private Auto Accompanied By: self Schedule Follow-up Appointment: No Clinical Summary of Care: Electronic Signature(s) Signed: 11/20/2022 4:26:30 PM By: Haywood Pao CHT EMT BS , , Entered By: Haywood Pao on 11/20/2022 13:26:30 [name] ([id number]) [id number] Page 2 of 2 -------------------------------------------------------------------------------- Vitals Details Patient Name: Date of Service: [name], [name] 11/20/2022 12:00 PM Medical Record Number: [id number] Patient Account Number: [id number] Date of Birth/Sex: Treating RN: 1940/09/06 (82 y.o. [name] Primary Care Valetta Mulroy: Charissa Bash Other Clinician: Karl Bales Referring Meaghen Vecchiarelli: Treating Azaria Stegman/Extender: Debby Bud in Treatment: 2 Vital Signs Time Taken: 11:59 Temperature (F): 97.2 Height (in): 71 Pulse (bpm): 50 Weight (lbs): 180 Respiratory Rate (breaths/min): 18 Body Mass Index (BMI): 25.1 Blood Pressure (mmHg): 152/75 Reference Range: 80 - 120 mg / dl Electronic Signature(s) Signed: 11/20/2022 2:15:06 PM By: Haywood Pao CHT EMT BS , , Entered By: Haywood Pao on 11/20/2022 11:15:06

## 2022-11-20 NOTE — Progress Notes (Signed)
WILFRID, MAGUIRE ([id number]) [id number].pdf Page 1 of 1 Visit Report for 11/20/2022 SuperBill Details Patient Name: Date of Service: [name], [name] 11/20/2022 Medical Record Number: [id number] Patient Account Number: [id number] Date of Birth/Sex: Treating RN: 1940/07/15 (82 y.o. [name] Primary Care Provider: Charissa Bash Other Clinician: Haywood Pao Referring Provider: Treating Provider/Extender: Debby Bud in Treatment: 2 Diagnosis Coding ICD-10 Codes Code Description N30.41 Irradiation cystitis with hematuria R35.1 Nocturia I10 Essential (primary) hypertension Facility Procedures CPT4 Code Description Modifier Quantity 02725366 G0277-(Facility Use Only) HBOT full body chamber, , 4 ICD-10 Diagnosis Description N30.41 Irradiation cystitis with hematuria R35.1 Nocturia I10 Essential (primary) hypertension Physician Procedures Quantity CPT4 Code Description Modifier 4403474 99183 - WC PHYS HYPERBARIC OXYGEN THERAPY 1 ICD-10 Diagnosis Description N30.41 Irradiation cystitis with hematuria R35.1 Nocturia I10 Essential (primary) hypertension Electronic Signature(s) Signed: 11/20/2022 3:41:22 PM By: Haywood Pao CHT EMT BS , , Signed: 11/20/2022 4:45:44 PM By: Allen Derry PA-C Entered By: Haywood Pao on 11/20/2022 12:41:22

## 2022-11-21 ENCOUNTER — Encounter (HOSPITAL_BASED_OUTPATIENT_CLINIC_OR_DEPARTMENT_OTHER): Payer: Medicare HMO | Admitting: Internal Medicine

## 2022-11-21 DIAGNOSIS — I10 Essential (primary) hypertension: Secondary | ICD-10-CM | POA: Diagnosis not present

## 2022-11-21 DIAGNOSIS — N3041 Irradiation cystitis with hematuria: Secondary | ICD-10-CM | POA: Diagnosis not present

## 2022-11-21 DIAGNOSIS — N401 Enlarged prostate with lower urinary tract symptoms: Secondary | ICD-10-CM | POA: Diagnosis not present

## 2022-11-21 DIAGNOSIS — L598 Other specified disorders of the skin and subcutaneous tissue related to radiation: Secondary | ICD-10-CM | POA: Diagnosis not present

## 2022-11-21 DIAGNOSIS — Z8249 Family history of ischemic heart disease and other diseases of the circulatory system: Secondary | ICD-10-CM | POA: Diagnosis not present

## 2022-11-21 DIAGNOSIS — R351 Nocturia: Secondary | ICD-10-CM | POA: Diagnosis not present

## 2022-11-22 ENCOUNTER — Encounter (HOSPITAL_BASED_OUTPATIENT_CLINIC_OR_DEPARTMENT_OTHER): Payer: Medicare HMO | Admitting: General Surgery

## 2022-11-22 DIAGNOSIS — N401 Enlarged prostate with lower urinary tract symptoms: Secondary | ICD-10-CM | POA: Diagnosis not present

## 2022-11-22 DIAGNOSIS — N3041 Irradiation cystitis with hematuria: Secondary | ICD-10-CM | POA: Diagnosis not present

## 2022-11-22 DIAGNOSIS — I10 Essential (primary) hypertension: Secondary | ICD-10-CM | POA: Diagnosis not present

## 2022-11-22 DIAGNOSIS — R351 Nocturia: Secondary | ICD-10-CM | POA: Diagnosis not present

## 2022-11-22 DIAGNOSIS — L598 Other specified disorders of the skin and subcutaneous tissue related to radiation: Secondary | ICD-10-CM | POA: Diagnosis not present

## 2022-11-22 DIAGNOSIS — Z8249 Family history of ischemic heart disease and other diseases of the circulatory system: Secondary | ICD-10-CM | POA: Diagnosis not present

## 2022-11-22 NOTE — Progress Notes (Signed)
KEATEN, LIRA ([id number]) [id number] Page 1 of 1 Visit Report for 11/21/2022 SuperBill Details Patient Name: Date of Service: [name], [name] 11/21/2022 Medical Record Number: [id number] Patient Account Number: [id number] Date of Birth/Sex: Treating RN: [birth year] (82 y.o. [name] Primary Care Provider: Charissa Bash Other Clinician: Haywood Pao Referring Provider: Treating Provider/Extender: Morene Antu in Treatment: 2 Diagnosis Coding ICD-10 Codes Code Description N30.41 Irradiation cystitis with hematuria R35.1 Nocturia I10 Essential (primary) hypertension Facility Procedures CPT4 Code Description Modifier Quantity [id number] G0277-(Facility Use Only) HBOT full body chamber, , 2 ICD-10 Diagnosis Description N30.41 Irradiation cystitis with hematuria R35.1 Nocturia I10 Essential (primary) hypertension Physician Procedures Quantity CPT4 Code Description Modifier [id number] 99183 - WC PHYS HYPERBARIC OXYGEN THERAPY 1 ICD-10 Diagnosis Description N30.41 Irradiation cystitis with hematuria R35.1 Nocturia I10 Essential (primary) hypertension Electronic Signature(s) Signed: 11/21/2022 6:30:31 PM By: Haywood Pao CHT EMT BS , , Signed: 11/22/2022 12:59:16 PM By: Baltazar Najjar MD Entered By: Haywood Pao on 11/21/2022 15:30:31

## 2022-11-22 NOTE — Progress Notes (Addendum)
[name], [name] ([id number]) [id number] Page 1 of 2 Visit Report for 11/21/2022 HBO Details Patient Name: Date of Service: [name], [name] 11/21/2022 12:00 PM Medical Record Number: [id number] Patient Account Number: [id number] Date of Birth/Sex: Treating RN: [birth year] (82 y.o. [name] Primary Care [name]: [name] Other Clinician: [name] Referring [name]: Treating [name]: [name] in Treatment: 2 HBO Treatment Course Details Treatment Course Number: 1 Ordering [name]: [name] T Treatments Ordered: otal 40 HBO Treatment Start Date: 11/11/2022 HBO Indication: Late Effect of Radiation HBO Treatment Details Treatment Number: 9 Patient Type: Outpatient Chamber Type: Monoplace Chamber Serial #: [id number] Treatment Protocol: 2.0 ATA with 90 minutes oxygen, and no air breaks Treatment Details Compression Rate Down: 1.0 psi / minute De-Compression Rate Up: 2.5 psi / minute Air breaks and breathing Decompress Decompress Compress Tx Pressure Begins Reached periods Begins Ends (leave unused spaces blank) Chamber Pressure (ATA 1 2 ------2 1 ) Clock Time (24 hr) 12:54 13:09 - - - - - - 13:39 13:48 Treatment Length: 54 (minutes) Treatment Segments: 2 Vital Signs Capillary Blood Glucose Reference Range: 80 - 120 mg / dl HBO Diabetic Blood Glucose Intervention Range: <131 mg/dl or >962 mg/dl Type: Time Vitals Blood Respiratory Capillary Blood Glucose Pulse Action Pulse: Temperature: Taken: Pressure: Rate: Glucose (mg/dl): Meter #: Oximetry (%) Taken: Pre 13:53 101/65 58 18 97.3 none per protocol Post 13:54 127/61 49 18 97.9 none per protocol Treatment Response Treatment Toleration: Well Treatment Completion Status: Treatment Completed without Adverse Event Treatment Notes [name] arrived with normal vital signs. He prepared for the treatment. After performing a  safety check, patient was placed in the chamber which was compressed with 100% oxygen at a rate of 1.5 psi/min after confirming normal ear equalization. He tolerated the treatment until a fire alarm caused evacuation and treatment was not restarted. Chamber decompressed at approximately 2.5 psi/min. He denied any issues with ear equalization and/or pain caused by barotrauma. Post-treatment heart rate was 49 bpm. Patient denied symptoms associated with bradycardia. He was stable upon discharge. [name] Notes Patient tolerated shortened treatment due to fire alarm Electronic Signature(s) Signed: 11/22/2022 12:59:16 PM By: [name] Najjar MD Previous Signature: 11/21/2022 6:29:49 PM Version By: [name] [name] , , Entered By: [name] on 11/22/2022 09:56:11 [name], [name] [employer] ([id number]) [id number] Page 2 of 2 -------------------------------------------------------------------------------- HBO Safety Checklist Details Patient Name: Date of Service: [name], [name] 11/21/2022 12:00 PM Medical Record Number: [id number] Patient Account Number: [id number] Date of Birth/Sex: Treating RN: 1940/03/13 (82 y.o. [name] Primary Care [name]: [name] Other Clinician: [name] Referring [name]: Treating [name]/Extender: [name] in Treatment: 2 HBO Safety Checklist Items Safety Checklist Consent Form Signed Patient voided / foley secured and emptied When did you last eato Lunch Last dose of injectable or oral agent n/a Ostomy pouch emptied and vented if applicable NA All implantable devices assessed, documented and approved NA Intravenous access site secured and place NA Valuables secured Linens and cotton and cotton/polyester blend (less than 51% polyester) Personal oil-based products / skin lotions / body lotions removed Wigs or hairpieces removed NA Smoking or tobacco materials  removed NA Books / newspapers / magazines / loose paper removed Cologne, aftershave, perfume and deodorant removed Jewelry removed (may wrap wedding band) Make-up removed NA Hair care products removed NA Battery operated devices (external) removed Heating patches and chemical warmers removed Titanium eyewear removed Nail polish cured greater than 10  hours Casting material cured greater than 10 hours Hearing aids removed Loose dentures or partials removed NA Prosthetics have been removed NA Patient demonstrates correct use of air break device (if applicable) Patient concerns have been addressed Patient grounding bracelet on and cord attached to chamber Specifics for Inpatients (complete in addition to above) Medication sheet sent with patient NA Intravenous medications needed or due during therapy sent with patient NA Drainage tubes (e.g. nasogastric tube or chest tube secured and vented) NA Endotracheal or Tracheotomy tube secured NA Cuff deflated of air and inflated with saline NA Airway suctioned NA Notes Paper version used prior to treatment start. Electronic Signature(s) Signed: 11/21/2022 6:17:07 PM By: [name] [name] , , Entered By: [name] on 11/21/2022 15:17:07

## 2022-11-22 NOTE — Progress Notes (Signed)
[name], [name] ([id number]) [id number] Page 1 of 2 Visit Report for 11/21/2022 Arrival Information Details Patient Name: Date of Service: [name], [name] 11/21/2022 12:00 PM Medical Record Number: [id number] Patient Account Number: [id number] Date of Birth/Sex: Treating RN: [birth year] (82 y.o. [name] Primary Care Devario Bucklew: Charissa Bash Other Clinician: Haywood Pao Referring Corby Villasenor: Treating Tayson Schnelle/Extender: Morene Antu in Treatment: 2 Visit Information History Since Last Visit All ordered tests and consults were completed: Yes Patient Arrived: Ambulatory Added or deleted any medications: No Arrival Time: 11:57 Any new allergies or adverse reactions: No Accompanied By: self Had a fall or experienced change in No Transfer Assistance: None activities of daily living that may affect Patient Identification Verified: Yes risk of falls: Secondary Verification Process Completed: Yes Signs or symptoms of abuse/neglect since last visito No Patient Requires Transmission-Based Precautions: No Hospitalized since last visit: No Patient Has Alerts: No Implantable device outside of the clinic excluding No cellular tissue based products placed in the center since last visit: Pain Present Now: No Electronic Signature(s) Signed: 11/21/2022 6:15:26 PM By: Haywood Pao CHT EMT BS , , Entered By: Haywood Pao on 11/21/2022 15:15:26 -------------------------------------------------------------------------------- Encounter Discharge Information Details Patient Name: Date of Service: [name] Blitz CIE S. 11/21/2022 12:00 PM Medical Record Number: [id number] Patient Account Number: [id number] Date of Birth/Sex: Treating RN: April 24, 1940 (82 y.o. [name] Primary Care Chananya Canizalez: Charissa Bash Other Clinician: Haywood Pao Referring Lani Havlik: Treating Madolin Twaddle/Extender: Morene Antu in Treatment: 2 Encounter Discharge Information Items Discharge Condition: Stable Ambulatory Status: Ambulatory Discharge Destination: Home Transportation: Private Auto Accompanied By: self Schedule Follow-up Appointment: No Clinical Summary of Care: Electronic Signature(s) Signed: 11/21/2022 6:31:01 PM By: Haywood Pao CHT EMT BS , , Entered By: Haywood Pao on 11/21/2022 15:31:01 [name] ([id number]) [id number] Page 2 of 2 -------------------------------------------------------------------------------- Vitals Details Patient Name: Date of Service: [name], [name] 11/21/2022 12:00 PM Medical Record Number: [id number] Patient Account Number: [id number] Date of Birth/Sex: Treating RN: [birth year] (82 y.o. [name] Primary Care Saara Kijowski: Charissa Bash Other Clinician: Karl Bales Referring Ocie Stanzione: Treating Mirabella Hilario/Extender: Morene Antu in Treatment: 2 Vital Signs Time Taken: 13:53 Temperature (F): 97.3 Height (in): 71 Pulse (bpm): 58 Weight (lbs): 180 Respiratory Rate (breaths/min): 18 Body Mass Index (BMI): 25.1 Blood Pressure (mmHg): 101/65 Reference Range: 80 - 120 mg / dl Electronic Signature(s) Signed: 11/21/2022 6:16:07 PM By: Haywood Pao CHT EMT BS , , Entered By: Haywood Pao on 11/21/2022 15:16:06

## 2022-11-22 NOTE — Progress Notes (Signed)
[name], [name] ([id number]) [id number] Page 1 of 2 Visit Report for 11/22/2022 Arrival Information Details Patient Name: Date of Service: [name], [name] 11/22/2022 9:30 A M Medical Record Number: [id number] Patient Account Number: [id number] Date of Birth/Sex: Treating RN: 12-Nov-1940 (82 y.o. [name] Primary Care Geeta Dworkin: Charissa Bash Other Clinician: Karl Bales Referring Aleda Madl: Treating Jaliya Siegmann/Extender: Claris Gower in Treatment: 2 Visit Information History Since Last Visit All ordered tests and consults were completed: Yes Patient Arrived: Ambulatory Added or deleted any medications: No Arrival Time: 09:39 Any new allergies or adverse reactions: No Accompanied By: None Had a fall or experienced change in No Transfer Assistance: None activities of daily living that may affect Patient Identification Verified: Yes risk of falls: Secondary Verification Process Completed: Yes Signs or symptoms of abuse/neglect since last visito No Patient Requires Transmission-Based Precautions: No Hospitalized since last visit: No Patient Has Alerts: No Implantable device outside of the clinic excluding No cellular tissue based products placed in the center since last visit: Pain Present Now: No Electronic Signature(s) Signed: 11/22/2022 12:18:39 PM By: Karl Bales EMT Entered By: Karl Bales on 11/22/2022 12:18:39 -------------------------------------------------------------------------------- Encounter Discharge Information Details Patient Name: Date of Service: [name]. 11/22/2022 9:30 A M Medical Record Number: [id number] Patient Account Number: [id number] Date of Birth/Sex: Treating RN: 01/15/1941 (82 y.o. [name] Primary Care Dajuan Turnley: Charissa Bash Other Clinician: Karl Bales Referring Aylyn Wenzler: Treating Jahdiel Krol/Extender: Claris Gower in  Treatment: 2 Encounter Discharge Information Items Discharge Condition: Stable Ambulatory Status: Ambulatory Discharge Destination: Home Transportation: Private Auto Accompanied By: None Schedule Follow-up Appointment: Yes Clinical Summary of Care: Electronic Signature(s) Signed: 11/22/2022 12:32:08 PM By: Karl Bales EMT Entered By: Karl Bales on 11/22/2022 12:32:08 [name] ([id number]) [id number] Page 2 of 2 -------------------------------------------------------------------------------- Vitals Details Patient Name: Date of Service: [name], [name] 11/22/2022 9:30 A M Medical Record Number: [id number] Patient Account Number: [id number] Date of Birth/Sex: Treating RN: 1940-06-15 (82 y.o. [name] Primary Care Brittni Hult: Charissa Bash Other Clinician: Karl Bales Referring Klayten Jolliff: Treating Maryjane Benedict/Extender: Claris Gower in Treatment: 2 Vital Signs Time Taken: 08:40 Temperature (F): 97.9 Height (in): 71 Pulse (bpm): 63 Weight (lbs): 180 Respiratory Rate (breaths/min): 18 Body Mass Index (BMI): 25.1 Blood Pressure (mmHg): 117/87 Reference Range: 80 - 120 mg / dl Electronic Signature(s) Signed: 11/22/2022 12:19:04 PM By: Karl Bales EMT Entered By: Karl Bales on 11/22/2022 12:19:03

## 2022-11-22 NOTE — Progress Notes (Signed)
SAVERIO, SORTO ([id number]) [id number] Page 1 of 2 Visit Report for 11/22/2022 HBO Details Patient Name: Date of Service: [name], [name] 11/22/2022 9:30 A M Medical Record Number: [id number] Patient Account Number: [id number] Date of Birth/Sex: Treating RN: 1940/12/05 (82 y.o. [name] Primary Care Lilyth Lawyer: Charissa Bash Other Clinician: Karl Bales Referring Clara Smolen: Treating Keva Darty/Extender: Claris Gower in Treatment: 2 HBO Treatment Course Details Treatment Course Number: 1 Ordering Kuba Shepherd: Lenda Kelp T Treatments Ordered: otal 40 HBO Treatment Start Date: 11/11/2022 HBO Indication: Late Effect of Radiation HBO Treatment Details Treatment Number: 10 Patient Type: Outpatient Chamber Type: Monoplace Chamber Serial #: [id number] Treatment Protocol: 2.0 ATA with 90 minutes oxygen, and no air breaks Treatment Details Compression Rate Down: 2.0 psi / minute De-Compression Rate Up: 2.0 psi / minute Air breaks and breathing Decompress Decompress Compress Tx Pressure Begins Reached periods Begins Ends (leave unused spaces blank) Chamber Pressure (ATA 1 2 ------2 1 ) Clock Time (24 hr) 09:01 09:14 - - - - - - 10:44 10:51 Treatment Length: 110 (minutes) Treatment Segments: 4 Vital Signs Capillary Blood Glucose Reference Range: 80 - 120 mg / dl HBO Diabetic Blood Glucose Intervention Range: <131 mg/dl or >782 mg/dl Time Vitals Blood Respiratory Capillary Blood Glucose Pulse Action Type: Pulse: Temperature: Taken: Pressure: Rate: Glucose (mg/dl): Meter #: Oximetry (%) Taken: Pre 08:40 117/87 63 18 97.9 Post 10:53 123/66 48 18 97.1 Treatment Response Treatment Toleration: Well Treatment Completion Status: Treatment Completed without Adverse Event Physician HBO Attestation: I certify that I supervised this HBO treatment in accordance with Medicare guidelines. A trained emergency response  team is readily available per Yes hospital policies and procedures. Continue HBOT as ordered. Yes Electronic Signature(s) Signed: 11/25/2022 8:08:25 AM By: Duanne Guess MD FACS Previous Signature: 11/22/2022 12:31:18 PM Version By: Karl Bales EMT Entered By: Duanne Guess on 11/25/2022 05:08:25 [name] ([id number]) [id number] Page 2 of 2 -------------------------------------------------------------------------------- HBO Safety Checklist Details Patient Name: Date of Service: [name], [name] 11/22/2022 9:30 A M Medical Record Number: [id number] Patient Account Number: [id number] Date of Birth/Sex: Treating RN: January 18, 1941 (82 y.o. [name] Primary Care Javayah Magaw: Charissa Bash Other Clinician: Karl Bales Referring Augusta Mirkin: Treating Jadarion Halbig/Extender: Claris Gower in Treatment: 2 HBO Safety Checklist Items Safety Checklist Consent Form Signed Patient voided / foley secured and emptied When did you last eato 0730 Last dose of injectable or oral agent NA Ostomy pouch emptied and vented if applicable NA All implantable devices assessed, documented and approved NA Intravenous access site secured and place NA Valuables secured Linens and cotton and cotton/polyester blend (less than 51% polyester) Personal oil-based products / skin lotions / body lotions removed Wigs or hairpieces removed NA Smoking or tobacco materials removed Books / newspapers / magazines / loose paper removed Cologne, aftershave, perfume and deodorant removed Jewelry removed (may wrap wedding band) Make-up removed NA Hair care products removed Battery operated devices (external) removed Heating patches and chemical warmers removed Titanium eyewear removed NA Nail polish cured greater than 10 hours NA Casting material cured greater than 10 hours NA Hearing aids removed NA Loose dentures or partials removed removed  by patient Prosthetics have been removed NA Patient demonstrates correct use of air break device (if applicable) Patient concerns have been addressed Patient grounding bracelet on and cord attached to chamber Specifics for Inpatients (complete in addition to above) Medication sheet sent with patient NA Intravenous medications needed or due during therapy sent with patient  NA Drainage tubes (e.g. nasogastric tube or chest tube secured and vented) NA Endotracheal or Tracheotomy tube secured NA Cuff deflated of air and inflated with saline NA Airway suctioned Notes The safety checklist was done before the treatment was started. Electronic Signature(s) Signed: 11/22/2022 12:20:09 PM By: Karl Bales EMT Entered By: Karl Bales on 11/22/2022 09:20:08

## 2022-11-25 ENCOUNTER — Encounter (HOSPITAL_BASED_OUTPATIENT_CLINIC_OR_DEPARTMENT_OTHER): Payer: Medicare HMO | Admitting: Internal Medicine

## 2022-11-25 DIAGNOSIS — L598 Other specified disorders of the skin and subcutaneous tissue related to radiation: Secondary | ICD-10-CM | POA: Diagnosis not present

## 2022-11-25 DIAGNOSIS — N401 Enlarged prostate with lower urinary tract symptoms: Secondary | ICD-10-CM | POA: Diagnosis not present

## 2022-11-25 DIAGNOSIS — R351 Nocturia: Secondary | ICD-10-CM | POA: Diagnosis not present

## 2022-11-25 DIAGNOSIS — I10 Essential (primary) hypertension: Secondary | ICD-10-CM | POA: Diagnosis not present

## 2022-11-25 DIAGNOSIS — Z8249 Family history of ischemic heart disease and other diseases of the circulatory system: Secondary | ICD-10-CM | POA: Diagnosis not present

## 2022-11-25 DIAGNOSIS — N3041 Irradiation cystitis with hematuria: Secondary | ICD-10-CM | POA: Diagnosis not present

## 2022-11-25 NOTE — Progress Notes (Signed)
[name], [name] ([id number]) [id number] Page 1 of 2 Visit Report for 11/25/2022 HBO Details Patient Name: Date of Service: [name], [name] 11/25/2022 12:00 PM Medical Record Number: [id number] Patient Account Number: [id number] Date of Birth/Sex: Treating RN: Aug 17, 1940 (82 y.o. [name] Primary Care Emiel Kielty: Charissa Bash Other Clinician: Haywood Pao Referring Vincen Bejar: Treating Laksh Hinners/Extender: Morene Antu in Treatment: 2 HBO Treatment Course Details Treatment Course Number: 1 Ordering Xue Low: Lenda Kelp T Treatments Ordered: otal 40 HBO Treatment Start Date: 11/11/2022 HBO Indication: Late Effect of Radiation HBO Treatment Details Treatment Number: 11 Patient Type: Outpatient Chamber Type: Monoplace Chamber Serial #: [id number] Treatment Protocol: 2.0 ATA with 90 minutes oxygen, and no air breaks Treatment Details Compression Rate Down: 1.5 psi / minute De-Compression Rate Up: 1.5 psi / minute Air breaks and breathing Decompress Decompress Compress Tx Pressure Begins Reached periods Begins Ends (leave unused spaces blank) Chamber Pressure (ATA 1 2 ------2 1 ) Clock Time (24 hr) 12:34 12:46 - - - - - - 14:16 14:26 Treatment Length: 112 (minutes) Treatment Segments: 4 Vital Signs Capillary Blood Glucose Reference Range: 80 - 120 mg / dl HBO Diabetic Blood Glucose Intervention Range: <131 mg/dl or >562 mg/dl Type: Time Vitals Blood Respiratory Capillary Blood Glucose Pulse Action Pulse: Temperature: Taken: Pressure: Rate: Glucose (mg/dl): Meter #: Oximetry (%) Taken: Pre 12:03 114/98 55 18 97.4 none per protocol Post 14:28 140/70 54 18 97.2 none per protocol Treatment Response Treatment Toleration: Well Treatment Completion Status: Treatment Completed without Adverse Event Treatment Notes Mr. Bielak arrived with normal vital signs except pulse rate of 55 bpm. He denied symptoms of  bradycardia. He prepared for treatment. After performing a safety check, patient was placed in the chamber which was compressed with 100% oxygen at an increasing rate approaching 1.5 psi/min after confirming normal ear equalization. He tolerated the treatment and subsequent decompression at the rate of 1.5 psi/min. His post-treatment vital signs were within normal range with the exception of heart rate of 54 bpm. He denied symptoms associated with bradycardia. He prepared for departure. He was stable upon discharge. [name] Notes No concerns with treatment given Physician HBO Attestation: I certify that I supervised this HBO treatment in accordance with Medicare guidelines. A trained emergency response team is readily available per Yes hospital policies and procedures. Continue HBOT as ordered. Yes Electronic Signature(s) [name], [name] ([id number]) [id number] Page 2 of 2 Unsigned Previous Signature: 11/25/2022 4:06:48 PM Version By: Haywood Pao CHT EMT BS , , Entered By: Baltazar Najjar on 11/25/2022 16:43:29 -------------------------------------------------------------------------------- HBO Safety Checklist Details Patient Name: Date of Service: [name], [name] 11/25/2022 12:00 PM Medical Record Number: [id number] Patient Account Number: [id number] Date of Birth/Sex: Treating RN: 06-Mar-1940 (82 y.o. [name] Primary Care Bayron Dalto: Charissa Bash Other Clinician: Haywood Pao Referring Yasamin Karel: Treating Iva Montelongo/Extender: Morene Antu in Treatment: 2 HBO Safety Checklist Items Safety Checklist Consent Form Signed Patient voided / foley secured and emptied When did you last eato Lunch Last dose of injectable or oral agent n/a Ostomy pouch emptied and vented if applicable NA All implantable devices assessed, documented and approved NA Intravenous access site secured and place NA Valuables  secured Linens and cotton and cotton/polyester blend (less than 51% polyester) Personal oil-based products / skin lotions / body lotions removed Wigs or hairpieces removed NA Smoking or tobacco materials removed NA Books / newspapers / magazines / loose paper removed Cologne, aftershave, perfume and deodorant removed Jewelry removed (  may wrap wedding band) Make-up removed NA Hair care products removed Battery operated devices (external) removed Heating patches and chemical warmers removed Titanium eyewear removed Nail polish cured greater than 10 hours NA Casting material cured greater than 10 hours NA Hearing aids removed at home Loose dentures or partials removed dentures removed Prosthetics have been removed NA Patient demonstrates correct use of air break device (if applicable) Patient concerns have been addressed Patient grounding bracelet on and cord attached to chamber Specifics for Inpatients (complete in addition to above) Medication sheet sent with patient NA Intravenous medications needed or due during therapy sent with patient NA Drainage tubes (e.g. nasogastric tube or chest tube secured and vented) NA Endotracheal or Tracheotomy tube secured NA Cuff deflated of air and inflated with saline NA Airway suctioned NA Notes Paper version used prior to treatment start. Electronic Signature(s) Signed: 11/25/2022 4:02:15 PM By: Haywood Pao CHT EMT BS , , Entered By: Haywood Pao on 11/25/2022 16:02:14

## 2022-11-25 NOTE — Progress Notes (Signed)
KEANE, YARNELL ([id number]) [id number] Page 1 of 2 Visit Report for 11/22/2022 Problem List Details Patient Name: Date of Service: [name], [name] 11/22/2022 9:30 A M Medical Record Number: [id number] Patient Account Number: [id number] Date of Birth/Sex: Treating RN: 02/11/1940 (82 y.o. [name] Primary Care Provider: Charissa Bash Other Clinician: Karl Bales Referring Provider: Treating Provider/Extender: Claris Gower in Treatment: 2 Active Problems ICD-10 Encounter Code Description Active Date MDM Diagnosis N30.41 Irradiation cystitis with hematuria 11/06/2022 No Yes R35.1 Nocturia 11/06/2022 No Yes I10 Essential (primary) hypertension 11/06/2022 No Yes Inactive Problems Resolved Problems Electronic Signature(s) Signed: 11/22/2022 12:31:39 PM By: Karl Bales EMT Signed: 11/25/2022 8:10:17 AM By: Duanne Guess MD FACS Entered By: Karl Bales on 11/22/2022 09:31:39 -------------------------------------------------------------------------------- SuperBill Details Patient Name: Date of Service: [name] 11/22/2022 Medical Record Number: [id number] Patient Account Number: [id number] Date of Birth/Sex: Treating RN: 16-Feb-1940 (82 y.o. [name] Primary Care Provider: Charissa Bash Other Clinician: Karl Bales Referring Provider: Treating Provider/Extender: Claris Gower in Treatment: 2 Diagnosis Coding ICD-10 Codes Code Description N30.41 Irradiation cystitis with hematuria R35.1 Nocturia I10 Essential (primary) hypertension Facility Procedures [name], [name] ([id number]): CPT4 Code Description Modifier Quantity [id number] G0277-(Facility Use Only) HBOT full body chamber, , 4 ICD-10 Diagnosis Description N30.41 Irradiation cystitis with hematuria [id number] Page 2 of 2: Physician Procedures : CPT4 Code  Description Modifier [phone] - WC PHYS HYPERBARIC OXYGEN THERAPY ICD-10 Diagnosis Description N30.41 Irradiation cystitis with hematuria Quantity: 1 Electronic Signature(s) Signed: 11/22/2022 12:31:35 PM By: Karl Bales EMT Signed: 11/25/2022 8:10:17 AM By: Duanne Guess MD FACS Entered By: Karl Bales on 11/22/2022 09:31:34

## 2022-11-26 ENCOUNTER — Encounter (HOSPITAL_BASED_OUTPATIENT_CLINIC_OR_DEPARTMENT_OTHER): Payer: Medicare HMO | Admitting: Internal Medicine

## 2022-11-26 DIAGNOSIS — Z8249 Family history of ischemic heart disease and other diseases of the circulatory system: Secondary | ICD-10-CM | POA: Diagnosis not present

## 2022-11-26 DIAGNOSIS — I10 Essential (primary) hypertension: Secondary | ICD-10-CM | POA: Diagnosis not present

## 2022-11-26 DIAGNOSIS — N3041 Irradiation cystitis with hematuria: Secondary | ICD-10-CM | POA: Diagnosis not present

## 2022-11-26 DIAGNOSIS — L598 Other specified disorders of the skin and subcutaneous tissue related to radiation: Secondary | ICD-10-CM | POA: Diagnosis not present

## 2022-11-26 DIAGNOSIS — N401 Enlarged prostate with lower urinary tract symptoms: Secondary | ICD-10-CM | POA: Diagnosis not present

## 2022-11-26 DIAGNOSIS — R351 Nocturia: Secondary | ICD-10-CM | POA: Diagnosis not present

## 2022-11-26 NOTE — Progress Notes (Signed)
[name] [name] ([id number]) [id number] Page 1 of 2 Visit Report for 11/25/2022 Arrival Information Details Patient Name: Date of Service: [name] [name] 11/25/2022 12:00 PM Medical Record Number: [id number] Patient Account Number: [id number] Date of Birth/Sex: Treating RN: 23-May-1940 (82 y.o. [name], [name] Primary Care Emojean Gertz: Charissa Bash Other Clinician: Karl Bales Referring Patric Buckhalter: Treating Shatana Saxton/Extender: Morene Antu in Treatment: 2 Visit Information History Since Last Visit All ordered tests and consults were completed: Yes Patient Arrived: Ambulatory Added or deleted any medications: No Arrival Time: 11:49 Any new allergies or adverse reactions: No Accompanied By: self Had a fall or experienced change in No Transfer Assistance: None activities of daily living that may affect Patient Identification Verified: Yes risk of falls: Secondary Verification Process Completed: Yes Signs or symptoms of abuse/neglect since last visito No Patient Requires Transmission-Based Precautions: No Hospitalized since last visit: No Patient Has Alerts: No Implantable device outside of the clinic excluding No cellular tissue based products placed in the center since last visit: Pain Present Now: No Electronic Signature(s) Signed: 11/25/2022 4:00:41 PM By: Haywood Pao CHT EMT BS , , Entered By: Haywood Pao on 11/25/2022 13:00:41 -------------------------------------------------------------------------------- Encounter Discharge Information Details Patient Name: Date of Service: [name] [name] CIE S. 11/25/2022 12:00 PM Medical Record Number: [id number] Patient Account Number: [id number] Date of Birth/Sex: Treating RN: [birth year] (82 y.o. [name] Primary Care Davanna He: Charissa Bash Other Clinician: Haywood Pao Referring Marlinda Miranda: Treating Kiela Shisler/Extender: Morene Antu in Treatment: 2 Encounter Discharge Information Items Discharge Condition: Stable Ambulatory Status: Ambulatory Discharge Destination: Home Transportation: Private Auto Accompanied By: self Schedule Follow-up Appointment: No Clinical Summary of Care: Electronic Signature(s) Signed: 11/25/2022 5:01:37 PM By: Haywood Pao CHT EMT BS , , Entered By: Haywood Pao on 11/25/2022 14:01:37 [name] [name] ([id number]) [phone] [phone].pdf Page 2 of 2 -------------------------------------------------------------------------------- Vitals Details Patient Name: Date of Service: [name] [name] [name] [name] 11/25/2022 12:00 PM Medical Record Number: [id number] Patient Account Number: [id number] Date of Birth/Sex: Treating RN: [birth year] (82 y.o. [name] Primary Care Ida Milbrath: Charissa Bash Other Clinician: Karl Bales Referring Belinda Bringhurst: Treating Tonie Elsey/Extender: Morene Antu in Treatment: 2 Vital Signs Time Taken: 12:03 Temperature (F): 97.4 Height (in): 71 Pulse (bpm): 55 Weight (lbs): 180 Respiratory Rate (breaths/min): 18 Body Mass Index (BMI): 25.1 Blood Pressure (mmHg): 114/98 Reference Range: 80 - 120 mg / dl Electronic Signature(s) Signed: 11/25/2022 4:01:08 PM By: Haywood Pao CHT EMT BS , , Entered By: Haywood Pao on 11/25/2022 13:01:08

## 2022-11-26 NOTE — Progress Notes (Signed)
WALLER, MONTE ([id number]) [id number] Page 1 of 1 Visit Report for 11/25/2022 SuperBill Details Patient Name: Date of Service: [name], [name] 11/25/2022 Medical Record Number: [id number] Patient Account Number: [id number] Date of Birth/Sex: Treating RN: 12-05-1940 (82 y.o. [name] Primary Care Provider: Charissa Bash Other Clinician: Haywood Pao Referring Provider: Treating Provider/Extender: Morene Antu in Treatment: 2 Diagnosis Coding ICD-10 Codes Code Description N30.41 Irradiation cystitis with hematuria R35.1 Nocturia I10 Essential (primary) hypertension Facility Procedures CPT4 Code Description Modifier Quantity [id number] G0277-(Facility Use Only) HBOT full body chamber, , 4 ICD-10 Diagnosis Description N30.41 Irradiation cystitis with hematuria R35.1 Nocturia Physician Procedures Quantity CPT4 Code Description Modifier [id number] - WC PHYS HYPERBARIC OXYGEN THERAPY 1 ICD-10 Diagnosis Description N30.41 Irradiation cystitis with hematuria R35.1 Nocturia Electronic Signature(s) Signed: 11/25/2022 4:07:06 PM By: Haywood Pao CHT EMT BS , , Signed: 11/26/2022 8:53:04 AM By: Baltazar Najjar MD Entered By: Haywood Pao on 11/25/2022 13:07:06

## 2022-11-27 ENCOUNTER — Encounter (HOSPITAL_BASED_OUTPATIENT_CLINIC_OR_DEPARTMENT_OTHER): Payer: Medicare HMO | Admitting: Physician Assistant

## 2022-11-27 DIAGNOSIS — Z8249 Family history of ischemic heart disease and other diseases of the circulatory system: Secondary | ICD-10-CM | POA: Diagnosis not present

## 2022-11-27 DIAGNOSIS — I10 Essential (primary) hypertension: Secondary | ICD-10-CM | POA: Diagnosis not present

## 2022-11-27 DIAGNOSIS — N3041 Irradiation cystitis with hematuria: Secondary | ICD-10-CM | POA: Diagnosis not present

## 2022-11-27 DIAGNOSIS — N401 Enlarged prostate with lower urinary tract symptoms: Secondary | ICD-10-CM | POA: Diagnosis not present

## 2022-11-27 DIAGNOSIS — L598 Other specified disorders of the skin and subcutaneous tissue related to radiation: Secondary | ICD-10-CM | POA: Diagnosis not present

## 2022-11-27 DIAGNOSIS — R351 Nocturia: Secondary | ICD-10-CM | POA: Diagnosis not present

## 2022-11-27 NOTE — Progress Notes (Signed)
CARMINO, ZIRKELBACH ([id number]) [id number] Page 1 of 2 Visit Report for 11/27/2022 Arrival Information Details Patient Name: Date of Service: [name], [name] 11/27/2022 10:00 A M Medical Record Number: [id number] Patient Account Number: [id number] Date of Birth/Sex: Treating RN: 11-26-1940 (82 y.o. [name], Millard.Loa Primary Care Khila Papp: Charissa Bash Other Clinician: Karl Bales Referring Tifanny Dollens: Treating Raeanne Deschler/Extender: Debby Bud in Treatment: 3 Visit Information History Since Last Visit All ordered tests and consults were completed: Yes Patient Arrived: Ambulatory Added or deleted any medications: No Arrival Time: 09:35 Any new allergies or adverse reactions: No Accompanied By: None Had a fall or experienced change in No Transfer Assistance: None activities of daily living that may affect Patient Identification Verified: Yes risk of falls: Secondary Verification Process Completed: Yes Signs or symptoms of abuse/neglect since last visito No Patient Requires Transmission-Based Precautions: No Hospitalized since last visit: No Patient Has Alerts: No Pain Present Now: No Electronic Signature(s) Signed: 11/27/2022 12:21:55 PM By: Karl Bales EMT Entered By: Karl Bales on 11/27/2022 09:21:55 -------------------------------------------------------------------------------- Encounter Discharge Information Details Patient Name: Date of Service: [name] Riffle. 11/27/2022 10:00 A M Medical Record Number: [id number] Patient Account Number: [id number] Date of Birth/Sex: Treating RN: 17-Apr-1940 (82 y.o. [name] Primary Care Kiara Keep: Charissa Bash Other Clinician: Karl Bales Referring Jaaziah Schulke: Treating Shaunte Weissinger/Extender: Debby Bud in Treatment: 3 Encounter Discharge Information Items Discharge Condition: Stable Ambulatory Status: Ambulatory Discharge Destination:  Home Transportation: Private Auto Accompanied By: None Schedule Follow-up Appointment: Yes Clinical Summary of Care: Electronic Signature(s) Signed: 11/27/2022 12:27:09 PM By: Karl Bales EMT Entered By: Karl Bales on 11/27/2022 09:27:09 [name] ([id number]) [id number] Page 2 of 2 -------------------------------------------------------------------------------- Vitals Details Patient Name: Date of Service: [name], [name] 11/27/2022 10:00 A M Medical Record Number: [id number] Patient Account Number: [id number] Date of Birth/Sex: Treating RN: 05/12/1940 (82 y.o. [name], Millard.Loa Primary Care Devine Klingel: Charissa Bash Other Clinician: Karl Bales Referring Yoon Barca: Treating Oland Arquette/Extender: Debby Bud in Treatment: 3 Vital Signs Time Taken: 09:35 Temperature (F): 97.2 Height (in): 71 Pulse (bpm): 55 Weight (lbs): 180 Respiratory Rate (breaths/min): 18 Body Mass Index (BMI): 25.1 Blood Pressure (mmHg): 124/69 Reference Range: 80 - 120 mg / dl Electronic Signature(s) Signed: 11/27/2022 12:22:28 PM By: Karl Bales EMT Entered By: Karl Bales on 11/27/2022 09:22:28

## 2022-11-27 NOTE — Progress Notes (Signed)
[name], [name] ([id number]) [id number] Page 1 of 2 Visit Report for 11/26/2022 Arrival Information Details Patient Name: Date of Service: TREVONN, CARDULLO 11/26/2022 10:00 A M Medical Record Number: [id number] Patient Account Number: [id number] Date of Birth/Sex: Treating RN: Jun 12, 1940 (82 y.o. [name] Primary Care Taheem Fricke: Charissa Bash Other Clinician: Haywood Pao Referring Solomia Harrell: Treating Berenice Oehlert/Extender: Morene Antu in Treatment: 2 Visit Information History Since Last Visit All ordered tests and consults were completed: Yes Patient Arrived: Ambulatory Added or deleted any medications: No Arrival Time: 09:37 Any new allergies or adverse reactions: No Accompanied By: self Had a fall or experienced change in No Transfer Assistance: None activities of daily living that may affect Patient Identification Verified: Yes risk of falls: Secondary Verification Process Completed: Yes Signs or symptoms of abuse/neglect since last visito No Patient Requires Transmission-Based Precautions: No Hospitalized since last visit: No Patient Has Alerts: No Implantable device outside of the clinic excluding No cellular tissue based products placed in the center since last visit: Pain Present Now: No Electronic Signature(s) Signed: 11/26/2022 1:49:44 PM By: Haywood Pao CHT EMT BS , , Entered By: Haywood Pao on 11/26/2022 13:49:44 -------------------------------------------------------------------------------- Encounter Discharge Information Details Patient Name: Date of Service: [name]. 11/26/2022 10:00 A M Medical Record Number: [id number] Patient Account Number: [id number] Date of Birth/Sex: Treating RN: [birth year] (82 y.o. [name] Primary Care Danitra Payano: Charissa Bash Other Clinician: Haywood Pao Referring Solei Wubben: Treating Sephira Zellman/Extender: Morene Antu in Treatment: 2 Encounter Discharge Information Items Discharge Condition: Stable Ambulatory Status: Ambulatory Discharge Destination: Home Transportation: Private Auto Accompanied By: self Schedule Follow-up Appointment: No Clinical Summary of Care: Electronic Signature(s) Signed: 11/26/2022 4:29:16 PM By: Haywood Pao CHT EMT BS , , Entered By: Haywood Pao on 11/26/2022 16:29:16 [name] ([id number]) [id number] Page 2 of 2 -------------------------------------------------------------------------------- Vitals Details Patient Name: Date of Service: [name], [name] 11/26/2022 10:00 A M Medical Record Number: [id number] Patient Account Number: [id number] Date of Birth/Sex: Treating RN: 1940/03/18 (82 y.o. [name] Primary Care Evony Rezek: Charissa Bash Other Clinician: Haywood Pao Referring Anaston Koehn: Treating Amaurie Wandel/Extender: Morene Antu in Treatment: 2 Vital Signs Time Taken: 09:43 Temperature (F): 97.3 Height (in): 71 Pulse (bpm): 54 Weight (lbs): 180 Respiratory Rate (breaths/min): 18 Body Mass Index (BMI): 25.1 Blood Pressure (mmHg): 130/71 Reference Range: 80 - 120 mg / dl Electronic Signature(s) Signed: 11/26/2022 4:14:57 PM By: Haywood Pao CHT EMT BS , , Entered By: Haywood Pao on 11/26/2022 16:14:57

## 2022-11-27 NOTE — Progress Notes (Signed)
ALEXX, GAYDEN ([id number]) [id number] Page 1 of 2 Visit Report for 11/27/2022 Problem List Details Patient Name: Date of Service: LASHAWN, DEW 11/27/2022 10:00 A M Medical Record Number: [id number] Patient Account Number: [id number] Date of Birth/Sex: Treating RN: 08-09-1940 (82 y.o. [name] Primary Care Provider: Charissa Bash Other Clinician: Karl Bales Referring Provider: Treating Provider/Extender: Debby Bud in Treatment: 3 Active Problems ICD-10 Encounter Code Description Active Date MDM Diagnosis N30.41 Irradiation cystitis with hematuria 11/06/2022 No Yes R35.1 Nocturia 11/06/2022 No Yes I10 Essential (primary) hypertension 11/06/2022 No Yes Inactive Problems Resolved Problems Electronic Signature(s) Signed: 11/27/2022 12:26:19 PM By: Karl Bales EMT Signed: 11/27/2022 4:33:40 PM By: Allen Derry PA-C Entered By: Karl Bales on 11/27/2022 09:26:19 -------------------------------------------------------------------------------- SuperBill Details Patient Name: Date of Service: [name] 11/27/2022 Medical Record Number: [id number] Patient Account Number: [id number] Date of Birth/Sex: Treating RN: January 16, 1941 (82 y.o. [name] Primary Care Provider: Charissa Bash Other Clinician: Karl Bales Referring Provider: Treating Provider/Extender: Debby Bud in Treatment: 3 Diagnosis Coding ICD-10 Codes Code Description N30.41 Irradiation cystitis with hematuria R35.1 Nocturia I10 Essential (primary) hypertension Facility Procedures KAREAM, KISIEL ([id number]): CPT4 Code Description Modifier Quantity [id number] G0277-(Facility Use Only) HBOT full body chamber, , 4 ICD-10 Diagnosis Description N30.41 Irradiation cystitis with hematuria R35.1 Nocturia [id number] Page 2 of 2: Physician Procedures : CPT4 Code  Description Modifier [id number] [phone] - WC PHYS HYPERBARIC OXYGEN THERAPY ICD-10 Diagnosis Description N30.41 Irradiation cystitis with hematuria R35.1 Nocturia Quantity: 1 Electronic Signature(s) Signed: 11/27/2022 12:26:14 PM By: Karl Bales EMT Signed: 11/27/2022 4:33:40 PM By: Allen Derry PA-C Previous Signature: 11/27/2022 12:25:21 PM Version By: Karl Bales EMT Entered By: Karl Bales on 11/27/2022 09:26:14

## 2022-11-27 NOTE — Progress Notes (Signed)
ISACC, VANDERKOOI ([id number]) [id number] Page 1 of 1 Visit Report for 11/26/2022 SuperBill Details Patient Name: Date of Service: KETRON, MCCLAMMY 11/26/2022 Medical Record Number: [id number] Patient Account Number: [id number] Date of Birth/Sex: Treating RN: 1940-08-22 (82 y.o. [name] Primary Care Provider: Charissa Bash Other Clinician: Haywood Pao Referring Provider: Treating Provider/Extender: Morene Antu in Treatment: 2 Diagnosis Coding ICD-10 Codes Code Description N30.41 Irradiation cystitis with hematuria R35.1 Nocturia I10 Essential (primary) hypertension Facility Procedures CPT4 Code Description Modifier Quantity [id number] G0277-(Facility Use Only) HBOT full body chamber, , 4 ICD-10 Diagnosis Description N30.41 Irradiation cystitis with hematuria R35.1 Nocturia Physician Procedures Quantity CPT4 Code Description Modifier [id number] 99183 - WC PHYS HYPERBARIC OXYGEN THERAPY 1 ICD-10 Diagnosis Description N30.41 Irradiation cystitis with hematuria R35.1 Nocturia Electronic Signature(s) Signed: 11/26/2022 4:28:39 PM By: Haywood Pao CHT EMT BS , , Signed: 11/26/2022 5:13:18 PM By: Baltazar Najjar MD Entered By: Haywood Pao on 11/26/2022 13:28:38

## 2022-11-27 NOTE — Progress Notes (Addendum)
ABY, FRIEDLEY ([id number]) [id number] Page 1 of 2 Visit Report for 11/27/2022 HBO Details Patient Name: Date of Service: [name], [name] 11/27/2022 10:00 A M Medical Record Number: [id number] Patient Account Number: [id number] Date of Birth/Sex: Treating RN: 06-18-1940 (82 y.o. [name], Millard.Loa Primary Care [name]: Charissa Bash Other Clinician: Karl Bales Referring [name]: Treating [name]/Extender: Debby Bud in Treatment: 3 HBO Treatment Course Details Treatment Course Number: 1 Ordering [name]: [name] Treatments Ordered: otal 40 HBO Treatment Start Date: 11/11/2022 HBO Indication: Late Effect of Radiation HBO Treatment Details Treatment Number: 13 Patient Type: Outpatient Chamber Type: Monoplace Chamber Serial #: [id number] Treatment Protocol: 2.0 ATA with 90 minutes oxygen, and no air breaks Treatment Details Compression Rate Down: 2.0 psi / minute De-Compression Rate Up: 2.0 psi / minute Air breaks and breathing Decompress Decompress Compress Tx Pressure Begins Reached periods Begins Ends (leave unused spaces blank) Chamber Pressure (ATA 1 2 ------2 1 ) Clock Time (24 hr) 10:20 10:28 - - - - - - 11:58 12:06 Treatment Length: 106 (minutes) Treatment Segments: 4 Vital Signs Capillary Blood Glucose Reference Range: 80 - 120 mg / dl HBO Diabetic Blood Glucose Intervention Range: <131 mg/dl or >962 mg/dl Time Vitals Blood Respiratory Capillary Blood Glucose Pulse Action Type: Pulse: Temperature: Taken: Pressure: Rate: Glucose (mg/dl): Meter #: Oximetry (%) Taken: Pre 09:35 124/69 55 18 97.2 Post 12:09 133/76 46 18 97.2 Treatment Response Treatment Toleration: Well Treatment Completion Status: Treatment Completed without Adverse Event Electronic Signature(s) Signed: 11/27/2022 12:25:03 PM By: Karl Bales EMT Signed: 11/27/2022 4:33:40 PM By: Allen Derry PA-C Entered By: Karl Bales on 11/27/2022 09:25:03 -------------------------------------------------------------------------------- HBO Safety Checklist Details Patient Name: Date of Service: [name] 11/27/2022 10:00 A [name], [name] ([id number]) [id number] Page 2 of 2 Medical Record Number: [id number] Patient Account Number: [id number] Date of Birth/Sex: Treating RN: Mar 24, 1940 (82 y.o. [name], Millard.Loa Primary Care [name]: Charissa Bash Other Clinician: Karl Bales Referring [name]: Treating [name]/Extender: Debby Bud in Treatment: 3 HBO Safety Checklist Items Safety Checklist Consent Form Signed Patient voided / foley secured and emptied When did you last eato 0745 Last dose of injectable or oral agent NA Ostomy pouch emptied and vented if applicable NA All implantable devices assessed, documented and approved NA Intravenous access site secured and place NA Valuables secured Linens and cotton and cotton/polyester blend (less than 51% polyester) Personal oil-based products / skin lotions / body lotions removed Wigs or hairpieces removed NA Smoking or tobacco materials removed Books / newspapers / magazines / loose paper removed Cologne, aftershave, perfume and deodorant removed Jewelry removed (may wrap wedding band) Make-up removed NA Hair care products removed Battery operated devices (external) removed Heating patches and chemical warmers removed Titanium eyewear removed NA Nail polish cured greater than 10 hours NA Casting material cured greater than 10 hours NA Hearing aids removed NA Loose dentures or partials removed NA Prosthetics have been removed NA Patient demonstrates correct use of air break device (if applicable) Patient concerns have been addressed Patient grounding bracelet on and cord attached to chamber Specifics for Inpatients (complete in addition to above) Medication sheet sent with  patient NA Intravenous medications needed or due during therapy sent with patient NA Drainage tubes (e.g. nasogastric tube or chest tube secured and vented) NA Endotracheal or Tracheotomy tube secured NA Cuff deflated of air and inflated with saline NA Airway suctioned NA Notes The safety checklist was done before  the treatment was started. Electronic Signature(s) Signed: 11/27/2022 12:23:51 PM By: Karl Bales EMT Entered By: Karl Bales on 11/27/2022 09:23:51

## 2022-11-27 NOTE — Progress Notes (Addendum)
ESIAH, COFFELL ([id number]) [id number] Page 1 of 2 Visit Report for 11/26/2022 HBO Details Patient Name: Date of Service: [name], [name] 11/26/2022 10:00 A M Medical Record Number: [id number] Patient Account Number: [id number] Date of Birth/Sex: Treating RN: [birth year] (82 y.o. [name] Primary Care Kyndell Zeiser: Charissa Bash Other Clinician: Haywood Pao Referring Nicolaos Mitrano: Treating Karsten Vaughn/Extender: Morene Antu in Treatment: 2 HBO Treatment Course Details Treatment Course Number: 1 Ordering Chitara Clonch: Lenda Kelp T Treatments Ordered: otal 40 HBO Treatment Start Date: 11/11/2022 HBO Indication: Late Effect of Radiation HBO Treatment Details Treatment Number: 12 Patient Type: Outpatient Chamber Type: Monoplace Chamber Serial #: [id number] Treatment Protocol: 2.0 ATA with 90 minutes oxygen, and no air breaks Treatment Details Compression Rate Down: 1.5 psi / minute De-Compression Rate Up: 2.0 psi / minute Air breaks and breathing Decompress Decompress Compress Tx Pressure Begins Reached periods Begins Ends (leave unused spaces blank) Chamber Pressure (ATA 1 2 ------2 1 ) Clock Time (24 hr) 10:32 10:44 - - - - - - 12:14 12:24 Treatment Length: 112 (minutes) Treatment Segments: 4 Vital Signs Capillary Blood Glucose Reference Range: 80 - 120 mg / dl HBO Diabetic Blood Glucose Intervention Range: <131 mg/dl or >440 mg/dl Type: Time Vitals Blood Pulse: Respiratory Temperature: Capillary Blood Glucose Pulse Action Taken: Pressure: Rate: Glucose (mg/dl): Meter #: Oximetry (%) Taken: Pre 09:43 130/71 54 18 97.3 asymptomatic for bradycardia Post 12:27 161/81 50 18 97.1 asymptomatic for bradycardia Treatment Response Treatment Toleration: Well Treatment Completion Status: Treatment Completed without Adverse Event Treatment Notes Mr. Scheidecker arrived with normal vital signs except pulse rate of 54  bpm. He denied symptoms of bradycardia. He prepared for treatment. After performing a safety check, patient was placed in the chamber which was compressed with 100% oxygen at an increasing rate approaching 2 psi/min after confirming normal ear equalization. Rate increased from 1 psi/min to 1.5 psi/min at 5 psig. Rate increased again from 1.5 psi/min to 2 psi/min at 10 psig. He tolerated the treatment and subsequent decompression at the rate of 2 psi/min. He denied issues with ear equalization and/or pain caused by barotrauma. His post-treatment vital signs were within normal range with the exception of heart rate of 54 bpm. He denied symptoms associated with bradycardia. He prepared for departure. He was stable upon discharge. Jaire Pinkham Notes No concern with treatment given Physician HBO Attestation: I certify that I supervised this HBO treatment in accordance with Medicare guidelines. A trained emergency response team is readily available per Yes hospital policies and procedures. Continue HBOT as ordered. CRAIGORY, MCNEARY ([id number]) [id number] Page 2 of 2 Electronic Signature(s) Signed: 11/26/2022 5:13:18 PM By: Baltazar Najjar MD Previous Signature: 11/26/2022 4:28:25 PM Version By: Haywood Pao CHT EMT BS , , Entered By: Baltazar Najjar on 11/26/2022 14:11:31 -------------------------------------------------------------------------------- HBO Safety Checklist Details Patient Name: Date of Service: [name] Riffle. 11/26/2022 10:00 A M Medical Record Number: [id number] Patient Account Number: [id number] Date of Birth/Sex: Treating RN: 09/24/1940 (82 y.o. [name] Primary Care Niklaus Mamaril: Charissa Bash Other Clinician: Haywood Pao Referring Josephine Rudnick: Treating Taegen Delker/Extender: Morene Antu in Treatment: 2 HBO Safety Checklist Items Safety Checklist Consent Form Signed Patient voided / foley secured and  emptied When did you last eato Breakfast Last dose of injectable or oral agent n/a Ostomy pouch emptied and vented if applicable NA All implantable devices assessed, documented and approved NA Intravenous access site secured and place NA Valuables secured Linens and cotton and cotton/polyester blend (  less than 51% polyester) Personal oil-based products / skin lotions / body lotions removed Wigs or hairpieces removed NA Smoking or tobacco materials removed NA Books / newspapers / magazines / loose paper removed Cologne, aftershave, perfume and deodorant removed Jewelry removed (may wrap wedding band) Make-up removed NA Hair care products removed Battery operated devices (external) removed Heating patches and chemical warmers removed Titanium eyewear removed Nail polish cured greater than 10 hours Casting material cured greater than 10 hours Hearing aids removed NA Loose dentures or partials removed NA Prosthetics have been removed NA Patient demonstrates correct use of air break device (if applicable) Patient concerns have been addressed Patient grounding bracelet on and cord attached to chamber Specifics for Inpatients (complete in addition to above) Medication sheet sent with patient NA Intravenous medications needed or due during therapy sent with patient NA Drainage tubes (e.g. nasogastric tube or chest tube secured and vented) NA Endotracheal or Tracheotomy tube secured NA Cuff deflated of air and inflated with saline NA Airway suctioned NA Notes Paper version used prior to treatment start. Electronic Signature(s) Signed: 11/26/2022 4:15:48 PM By: Haywood Pao CHT EMT BS , , Entered By: Haywood Pao on 11/26/2022 13:15:48

## 2022-11-28 ENCOUNTER — Encounter (HOSPITAL_BASED_OUTPATIENT_CLINIC_OR_DEPARTMENT_OTHER): Payer: Medicare HMO | Admitting: Internal Medicine

## 2022-11-28 DIAGNOSIS — N401 Enlarged prostate with lower urinary tract symptoms: Secondary | ICD-10-CM | POA: Diagnosis not present

## 2022-11-28 DIAGNOSIS — I10 Essential (primary) hypertension: Secondary | ICD-10-CM | POA: Diagnosis not present

## 2022-11-28 DIAGNOSIS — Z8249 Family history of ischemic heart disease and other diseases of the circulatory system: Secondary | ICD-10-CM | POA: Diagnosis not present

## 2022-11-28 DIAGNOSIS — R351 Nocturia: Secondary | ICD-10-CM | POA: Diagnosis not present

## 2022-11-28 DIAGNOSIS — L598 Other specified disorders of the skin and subcutaneous tissue related to radiation: Secondary | ICD-10-CM | POA: Diagnosis not present

## 2022-11-28 DIAGNOSIS — N3041 Irradiation cystitis with hematuria: Secondary | ICD-10-CM | POA: Diagnosis not present

## 2022-11-29 ENCOUNTER — Encounter (HOSPITAL_BASED_OUTPATIENT_CLINIC_OR_DEPARTMENT_OTHER): Payer: Medicare HMO | Attending: General Surgery | Admitting: General Surgery

## 2022-11-29 DIAGNOSIS — Z8546 Personal history of malignant neoplasm of prostate: Secondary | ICD-10-CM | POA: Diagnosis not present

## 2022-11-29 DIAGNOSIS — L598 Other specified disorders of the skin and subcutaneous tissue related to radiation: Secondary | ICD-10-CM | POA: Diagnosis not present

## 2022-11-29 DIAGNOSIS — N3041 Irradiation cystitis with hematuria: Secondary | ICD-10-CM | POA: Diagnosis not present

## 2022-11-29 DIAGNOSIS — R351 Nocturia: Secondary | ICD-10-CM | POA: Diagnosis not present

## 2022-11-29 DIAGNOSIS — Z923 Personal history of irradiation: Secondary | ICD-10-CM | POA: Insufficient documentation

## 2022-11-29 DIAGNOSIS — I10 Essential (primary) hypertension: Secondary | ICD-10-CM | POA: Diagnosis not present

## 2022-11-29 NOTE — Progress Notes (Signed)
SAVIER, TRICKETT ([id number]) [id number] Page 1 of 2 Visit Report for 11/29/2022 HBO Details Patient Name: Date of Service: [name], [name] 11/29/2022 9:30 A M Medical Record Number: [id number] Patient Account Number: [id number] Date of Birth/Sex: Treating RN: 09-25-1940 (82 y.o. [name] Primary Care Zuhayr Deeney: Charissa Bash Other Clinician: Haywood Pao Referring Taylr Meuth: Treating Rorey Hodges/Extender: Claris Gower in Treatment: 3 HBO Treatment Course Details Treatment Course Number: 1 Ordering Renny Gunnarson: Lenda Kelp T Treatments Ordered: otal 40 HBO Treatment Start Date: 11/11/2022 HBO Indication: Late Effect of Radiation HBO Treatment Details Treatment Number: 15 Patient Type: Outpatient Chamber Type: Monoplace Chamber Serial #: [id number] Treatment Protocol: 2.0 ATA with 90 minutes oxygen, and no air breaks Treatment Details Compression Rate Down: 1.5 psi / minute De-Compression Rate Up: 1.5 psi / minute Air breaks and breathing Decompress Decompress Compress Tx Pressure Begins Reached periods Begins Ends (leave unused spaces blank) Chamber Pressure (ATA 1 2 ------2 1 ) Clock Time (24 hr) 10:06 10:18 - - - - - - 11:48 11:58 Treatment Length: 112 (minutes) Treatment Segments: 4 Vital Signs Capillary Blood Glucose Reference Range: 80 - 120 mg / dl HBO Diabetic Blood Glucose Intervention Range: <131 mg/dl or >784 mg/dl Type: Time Vitals Blood Respiratory Capillary Blood Glucose Pulse Action Pulse: Temperature: Taken: Pressure: Rate: Glucose (mg/dl): Meter #: Oximetry (%) Taken: Pre 09:30 101/56 57 18 97.2 none per protocol Post 11:59 143/81 46 18 97.7 none per protocol Treatment Response Treatment Toleration: Well Treatment Completion Status: Treatment Completed without Adverse Event Treatment Notes [name] arrived with BP 101/56 mmHg pulse of 57 bpm. He denied symptoms of bradycardia and  hypotension stating that he felt fine. He prepared for treatment. After performing a safety check, he was placed in the chamber which was compressed with 100% oxygen at a rate of 1.5 psi/min after confirming normal ear equalization. He tolerated the treatment and subsequent decompression of the chamber at a rate of 1.5 psi/min. He denied issues with ear equalization and/or pain associated with barotrauma. His post treatment vital signs were within normal range except heart rate at 46 bpm. He denied symptoms related to bradycardia and appeared asymptomatic as well. He was stable upon discharge. Physician HBO Attestation: I certify that I supervised this HBO treatment in accordance with Medicare guidelines. A trained emergency response team is readily available per Yes hospital policies and procedures. Continue HBOT as ordered. Yes Electronic Signature(s) Signed: 12/02/2022 7:47:41 AM By: Duanne Guess MD FACS Previous Signature: 11/29/2022 12:37:11 PM Version By: Haywood Pao CHT EMT BS , , Entered By: Duanne Guess on 12/02/2022 04:47:38 [name] ([id number]) [id number] Page 2 of 2 -------------------------------------------------------------------------------- HBO Safety Checklist Details Patient Name: Date of Service: [name], [name] 11/29/2022 9:30 A M Medical Record Number: [id number] Patient Account Number: [id number] Date of Birth/Sex: Treating RN: 12/30/1940 (82 y.o. [name] Primary Care Rever Pichette: Charissa Bash Other Clinician: Haywood Pao Referring Dwanna Goshert: Treating Zylon Creamer/Extender: Claris Gower in Treatment: 3 HBO Safety Checklist Items Safety Checklist Consent Form Signed Patient voided / foley secured and emptied When did you last eato 0740 Last dose of injectable or oral agent n/a Ostomy pouch emptied and vented if applicable NA All implantable devices assessed, documented and  approved NA Intravenous access site secured and place NA Valuables secured Linens and cotton and cotton/polyester blend (less than 51% polyester) Personal oil-based products / skin lotions / body lotions removed Wigs or hairpieces removed NA Smoking or tobacco materials  removed NA Books / newspapers / magazines / loose paper removed Cologne, aftershave, perfume and deodorant removed Jewelry removed (may wrap wedding band) Make-up removed NA Hair care products removed Battery operated devices (external) removed Heating patches and chemical warmers removed Titanium eyewear removed NA Nail polish cured greater than 10 hours NA Casting material cured greater than 10 hours NA Hearing aids removed NA Loose dentures or partials removed NA Prosthetics have been removed NA Patient demonstrates correct use of air break device (if applicable) Patient concerns have been addressed Patient grounding bracelet on and cord attached to chamber Specifics for Inpatients (complete in addition to above) Medication sheet sent with patient NA Intravenous medications needed or due during therapy sent with patient NA Drainage tubes (e.g. nasogastric tube or chest tube secured and vented) NA Endotracheal or Tracheotomy tube secured NA Cuff deflated of air and inflated with saline NA Airway suctioned NA Notes Paper version used prior to treatment start. Electronic Signature(s) Signed: 11/29/2022 11:35:51 AM By: Haywood Pao CHT EMT BS , , Entered By: Haywood Pao on 11/29/2022 08:35:51

## 2022-11-29 NOTE — Progress Notes (Signed)
JACOBIE, STAMEY ([id number]) [id number] Page 1 of 1 Visit Report for 11/28/2022 SuperBill Details Patient Name: Date of Service: [name], [name] 11/28/2022 Medical Record Number: [id number] Patient Account Number: [id number] Date of Birth/Sex: Treating RN: 11/14/1940 (82 y.o. [name] Primary Care Provider: Charissa Bash Other Clinician: Haywood Pao Referring Provider: Treating Provider/Extender: Morene Antu in Treatment: 3 Diagnosis Coding ICD-10 Codes Code Description N30.41 Irradiation cystitis with hematuria R35.1 Nocturia I10 Essential (primary) hypertension Facility Procedures CPT4 Code Description Modifier Quantity [id number] G0277-(Facility Use Only) HBOT full body chamber, , 4 ICD-10 Diagnosis Description N30.41 Irradiation cystitis with hematuria R35.1 Nocturia Physician Procedures Quantity CPT4 Code Description Modifier [id number] 99183 - WC PHYS HYPERBARIC OXYGEN THERAPY 1 ICD-10 Diagnosis Description N30.41 Irradiation cystitis with hematuria R35.1 Nocturia Electronic Signature(s) Signed: 11/28/2022 4:34:02 PM By: Haywood Pao CHT EMT BS , , Signed: 11/28/2022 5:00:10 PM By: Baltazar Najjar MD Entered By: Haywood Pao on 11/28/2022 13:34:01

## 2022-11-29 NOTE — Progress Notes (Signed)
KYLIE, GROS ([id number]) [id number] Page 1 of 2 Visit Report for 11/28/2022 Arrival Information Details Patient Name: Date of Service: TARL, CEPHAS 11/28/2022 10:00 A M Medical Record Number: [id number] Patient Account Number: [id number] Date of Birth/Sex: Treating RN: May 28, 1940 (82 y.o. [name] Primary Care Dilan Fullenwider: Charissa Bash Other Clinician: Haywood Pao Referring Lemmie Vanlanen: Treating Andra Matsuo/Extender: Morene Antu in Treatment: 3 Visit Information History Since Last Visit All ordered tests and consults were completed: Yes Patient Arrived: Ambulatory Added or deleted any medications: No Arrival Time: 09:43 Any new allergies or adverse reactions: No Accompanied By: self Had a fall or experienced change in No Transfer Assistance: None activities of daily living that may affect Patient Identification Verified: Yes risk of falls: Secondary Verification Process Completed: Yes Signs or symptoms of abuse/neglect since last visito No Patient Requires Transmission-Based Precautions: No Hospitalized since last visit: No Patient Has Alerts: No Implantable device outside of the clinic excluding No cellular tissue based products placed in the center since last visit: Pain Present Now: No Electronic Signature(s) Signed: 11/28/2022 4:21:30 PM By: Haywood Pao CHT EMT BS , , Entered By: Haywood Pao on 11/28/2022 16:21:30 -------------------------------------------------------------------------------- Encounter Discharge Information Details Patient Name: Date of Service: [name] 11/28/2022 10:00 A M Medical Record Number: [id number] Patient Account Number: [id number] Date of Birth/Sex: Treating RN: 09-Jan-1941 (82 y.o. [name] Primary Care Kiyana Vazguez: Charissa Bash Other Clinician: Haywood Pao Referring Meeghan Skipper: Treating Christyan Reger/Extender: Morene Antu in Treatment: 3 Encounter Discharge Information Items Discharge Condition: Stable Ambulatory Status: Ambulatory Discharge Destination: Home Transportation: Private Auto Accompanied By: self Schedule Follow-up Appointment: No Clinical Summary of Care: Electronic Signature(s) Signed: 11/28/2022 4:34:25 PM By: Haywood Pao CHT EMT BS , , Entered By: Haywood Pao on 11/28/2022 16:34:25 [name] ([id number]) [id number] Page 2 of 2 -------------------------------------------------------------------------------- Vitals Details Patient Name: Date of Service: GERON, MULFORD 11/28/2022 10:00 A M Medical Record Number: [id number] Patient Account Number: [id number] Date of Birth/Sex: Treating RN: 02-28-1940 (82 y.o. [name] Primary Care Antwan Bribiesca: Charissa Bash Other Clinician: Karl Bales Referring Lacinda Curvin: Treating Gumecindo Hopkin/Extender: Morene Antu in Treatment: 3 Vital Signs Time Taken: 10:05 Temperature (F): 97.3 Height (in): 71 Pulse (bpm): 55 Weight (lbs): 180 Respiratory Rate (breaths/min): 16 Body Mass Index (BMI): 25.1 Blood Pressure (mmHg): 123/64 Reference Range: 80 - 120 mg / dl Electronic Signature(s) Signed: 11/28/2022 4:22:10 PM By: Haywood Pao CHT EMT BS , , Entered By: Haywood Pao on 11/28/2022 16:22:10

## 2022-11-29 NOTE — Progress Notes (Addendum)
JAHIR, HALT ([id number]) [id number] Page 1 of 2 Visit Report for 11/29/2022 Arrival Information Details Patient Name: Date of Service: [name], [name] 11/29/2022 9:30 A M Medical Record Number: [id number] Patient Account Number: [id number] Date of Birth/Sex: Treating RN: 24-Oct-1940 (82 y.o. [name] Primary Care Edrik Rundle: Charissa Bash Other Clinician: Karl Bales Referring Sharmila Wrobleski: Treating Toshua Honsinger/Extender: Claris Gower in Treatment: 3 Visit Information History Since Last Visit All ordered tests and consults were completed: Yes Patient Arrived: Ambulatory Added or deleted any medications: No Arrival Time: 09:15 Any new allergies or adverse reactions: No Accompanied By: self Had a fall or experienced change in No Transfer Assistance: None activities of daily living that may affect Patient Identification Verified: Yes risk of falls: Secondary Verification Process Completed: Yes Signs or symptoms of abuse/neglect since last visito No Patient Requires Transmission-Based Precautions: No Hospitalized since last visit: No Patient Has Alerts: No Implantable device outside of the clinic excluding No cellular tissue based products placed in the center since last visit: Pain Present Now: No Electronic Signature(s) Signed: 11/29/2022 10:37:16 AM By: Haywood Pao CHT EMT BS , , Entered By: Haywood Pao on 11/29/2022 10:37:16 -------------------------------------------------------------------------------- Encounter Discharge Information Details Patient Name: Date of Service: [name] Riffle. 11/29/2022 9:30 A M Medical Record Number: [id number] Patient Account Number: [id number] Date of Birth/Sex: Treating RN: [birth year] (82 y.o. [name] Primary Care Jamian Andujo: Charissa Bash Other Clinician: Haywood Pao Referring Penny Frisbie: Treating Oshea Percival/Extender: Claris Gower in Treatment: 3 Encounter Discharge Information Items Discharge Condition: Stable Ambulatory Status: Ambulatory Discharge Destination: Home Transportation: Private Auto Accompanied By: self Schedule Follow-up Appointment: No Clinical Summary of Care: Electronic Signature(s) Signed: 11/29/2022 12:49:40 PM By: Haywood Pao CHT EMT BS , , Entered By: Haywood Pao on 11/29/2022 12:49:40 [name] ([id number]) [id number].pdf Page 2 of 2 -------------------------------------------------------------------------------- Vitals Details Patient Name: Date of Service: [name], [name] 11/29/2022 9:30 A M Medical Record Number: [id number] Patient Account Number: [id number] Date of Birth/Sex: Treating RN: 1940/08/22 (82 y.o. [name] Primary Care Abdulraheem Pineo: Charissa Bash Other Clinician: Karl Bales Referring Dedra Matsuo: Treating Vin Yonke/Extender: Claris Gower in Treatment: 3 Vital Signs Time Taken: 09:30 Temperature (F): 97.2 Height (in): 71 Pulse (bpm): 57 Weight (lbs): 180 Respiratory Rate (breaths/min): 18 Body Mass Index (BMI): 25.1 Blood Pressure (mmHg): 101/56 Reference Range: 80 - 120 mg / dl Electronic Signature(s) Signed: 11/29/2022 11:34:01 AM By: Haywood Pao CHT EMT BS , , Entered By: Haywood Pao on 11/29/2022 11:34:01

## 2022-12-02 ENCOUNTER — Encounter (HOSPITAL_BASED_OUTPATIENT_CLINIC_OR_DEPARTMENT_OTHER): Payer: Medicare HMO | Admitting: Internal Medicine

## 2022-12-02 DIAGNOSIS — N3041 Irradiation cystitis with hematuria: Secondary | ICD-10-CM | POA: Diagnosis not present

## 2022-12-02 DIAGNOSIS — I10 Essential (primary) hypertension: Secondary | ICD-10-CM | POA: Diagnosis not present

## 2022-12-02 DIAGNOSIS — Z8546 Personal history of malignant neoplasm of prostate: Secondary | ICD-10-CM | POA: Diagnosis not present

## 2022-12-02 DIAGNOSIS — R351 Nocturia: Secondary | ICD-10-CM | POA: Diagnosis not present

## 2022-12-02 DIAGNOSIS — L598 Other specified disorders of the skin and subcutaneous tissue related to radiation: Secondary | ICD-10-CM | POA: Diagnosis not present

## 2022-12-02 DIAGNOSIS — Z923 Personal history of irradiation: Secondary | ICD-10-CM | POA: Diagnosis not present

## 2022-12-02 NOTE — Progress Notes (Addendum)
[name], [name] ([id number]) [id number] Page 1 of 2 Visit Report for 12/02/2022 HBO Details Patient Name: Date of Service: [name], [name] 12/02/2022 10:00 A M Medical Record Number: [id number] Patient Account Number: [id number] Date of Birth/Sex: Treating RN: 1940-04-26 (82 y.o. [name], Millard.Loa Primary Care Adiana Smelcer: Charissa Bash Other Clinician: Karl Bales Referring Eylin Pontarelli: Treating Oakland Fant/Extender: Morene Antu in Treatment: 3 HBO Treatment Course Details Treatment Course Number: 1 Ordering Early Steel: Lenda Kelp T Treatments Ordered: otal 40 HBO Treatment Start Date: 11/11/2022 HBO Indication: Late Effect of Radiation HBO Treatment Details Treatment Number: 16 Patient Type: Outpatient Chamber Type: Monoplace Chamber Serial #: [id number] Treatment Protocol: 2.0 ATA with 90 minutes oxygen, and no air breaks Treatment Details Compression Rate Down: 1.0 psi / minute De-Compression Rate Up: 2.0 psi / minute Air breaks and breathing Decompress Decompress Compress Tx Pressure Begins Reached periods Begins Ends (leave unused spaces blank) Chamber Pressure (ATA 1 2 ------2 1 ) Clock Time (24 hr) 10:41 10:55 - - - - - - 12:25 12:32 Treatment Length: 111 (minutes) Treatment Segments: 4 Vital Signs Capillary Blood Glucose Reference Range: 80 - 120 mg / dl HBO Diabetic Blood Glucose Intervention Range: <131 mg/dl or >027 mg/dl Time Vitals Blood Respiratory Capillary Blood Glucose Pulse Action Type: Pulse: Temperature: Taken: Pressure: Rate: Glucose (mg/dl): Meter #: Oximetry (%) Taken: Pre 10:13 143/80 83 16 97.3 Post 12:34 133/87 53 18 97.2 Treatment Response Treatment Toleration: Well Treatment Completion Status: Treatment Completed without Adverse Event Ozro Russett Notes No concerns with treatment given Physician HBO Attestation: I certify that I supervised this HBO treatment in accordance with  Medicare guidelines. A trained emergency response team is readily available per Yes hospital policies and procedures. Continue HBOT as ordered. Yes Electronic Signature(s) Signed: 12/02/2022 4:50:34 PM By: Baltazar Najjar MD Previous Signature: 12/02/2022 2:21:23 PM Version By: Karl Bales EMT Entered By: Baltazar Najjar on 12/02/2022 13:20:15 [name] ([id number]) [id number] Page 2 of 2 -------------------------------------------------------------------------------- HBO Safety Checklist Details Patient Name: Date of Service: [name], [name] 12/02/2022 10:00 A M Medical Record Number: [id number] Patient Account Number: [id number] Date of Birth/Sex: Treating RN: 03/30/1940 (82 y.o. [name], Millard.Loa Primary Care Cam Dauphin: Charissa Bash Other Clinician: Karl Bales Referring Kirsten Mckone: Treating Luvada Salamone/Extender: Morene Antu in Treatment: 3 HBO Safety Checklist Items Safety Checklist Consent Form Signed Patient voided / foley secured and emptied When did you last eato 0750 Last dose of injectable or oral agent NA Ostomy pouch emptied and vented if applicable NA All implantable devices assessed, documented and approved NA Intravenous access site secured and place NA Valuables secured Linens and cotton and cotton/polyester blend (less than 51% polyester) Personal oil-based products / skin lotions / body lotions removed Wigs or hairpieces removed NA Smoking or tobacco materials removed Books / newspapers / magazines / loose paper removed Cologne, aftershave, perfume and deodorant removed Jewelry removed (may wrap wedding band) Make-up removed NA Hair care products removed Battery operated devices (external) removed Heating patches and chemical warmers removed Titanium eyewear removed NA Nail polish cured greater than 10 hours NA Casting material cured greater than 10 hours NA Hearing aids removed NA Loose  dentures or partials removed removed by patient Prosthetics have been removed NA Patient demonstrates correct use of air break device (if applicable) Patient concerns have been addressed Patient grounding bracelet on and cord attached to chamber Specifics for Inpatients (complete in addition to above) Medication sheet sent with patient NA Intravenous medications needed or  due during therapy sent with patient NA Drainage tubes (e.g. nasogastric tube or chest tube secured and vented) NA Endotracheal or Tracheotomy tube secured NA Cuff deflated of air and inflated with saline NA Airway suctioned NA Notes The safety checklist was done before the treatment was started. Electronic Signature(s) Signed: 12/02/2022 2:16:49 PM By: Karl Bales EMT Entered By: Karl Bales on 12/02/2022 11:16:49

## 2022-12-02 NOTE — Progress Notes (Signed)
DETRIC, SCALISI ([id number]) [id number] Page 1 of 1 Visit Report for 11/29/2022 SuperBill Details Patient Name: Date of Service: [name], [name] 11/29/2022 Medical Record Number: [id number] Patient Account Number: [id number] Date of Birth/Sex: Treating RN: [birth year] (82 y.o. [name] Primary Care Provider: Charissa Bash Other Clinician: Haywood Pao Referring Provider: Treating Provider/Extender: Claris Gower in Treatment: 3 Diagnosis Coding ICD-10 Codes Code Description N30.41 Irradiation cystitis with hematuria R35.1 Nocturia I10 Essential (primary) hypertension Facility Procedures CPT4 Code Description Modifier Quantity [id number] G0277-(Facility Use Only) HBOT full body chamber, , 4 ICD-10 Diagnosis Description N30.41 Irradiation cystitis with hematuria R35.1 Nocturia Physician Procedures Quantity CPT4 Code Description Modifier [id number] - WC PHYS HYPERBARIC OXYGEN THERAPY 1 ICD-10 Diagnosis Description 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

## 2022-12-02 NOTE — Progress Notes (Signed)
EZERIAH, LUTY ([id number]) [id number] Page 1 of 2 Visit Report for 12/02/2022 Problem List Details Patient Name: Date of Service: DEVYNN, SCHEFF 12/02/2022 10:00 A M Medical Record Number: [id number] Patient Account Number: [id number] Date of Birth/Sex: Treating RN: [birth year] (82 y.o. 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 12/02/2022 Medical Record Number: [id number] Patient Account Number: [id number] Date of Birth/Sex: Treating RN: 1940/07/29 (82 y.o. 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.41 Irradiation cystitis with hematuria R35.1 Nocturia I10 Essential (primary) hypertension Facility Procedures MARKISE, HAYMER ([id number]): CPT4 Code Description Modifier Quantity [id number] G0277-(Facility Use Only) HBOT full body chamber, , 4 ICD-10 Diagnosis Description N30.41 Irradiation cystitis with hematuria R35.1 Nocturia I10 Essential (primary)  hypertension [id number] Page 2 of  2: Physician Procedures : CPT4 Code Description Modifier [id number] [phone] - WC PHYS HYPERBARIC OXYGEN THERAPY ICD-10 Diagnosis Description N30.41 Irradiation cystitis with hematuria R35.1 Nocturia I10 Essential (primary) hypertension Quantity: 1 Electronic Signature(s) Signed: 12/02/2022 2:21:48 PM By: Karl Bales EMT Signed: 12/02/2022 4:50:34 PM By: Baltazar Najjar MD Entered By: Karl Bales on 12/02/2022 11:21:48

## 2022-12-02 NOTE — Progress Notes (Signed)
NASHID, PELLUM ([id number]) [id number] Page 1 of 2 Visit Report for 12/02/2022 Arrival Information Details Patient Name: Date of Service: ZAVIAN, SLOWEY 12/02/2022 10:00 A M Medical Record Number: [id number] Patient Account Number: [id number] Date of Birth/Sex: Treating RN: 1940-06-28 (82 y.o. [name], Millard.Loa Primary Care Halei Hanover: Charissa Bash Other Clinician: Karl Bales Referring Mayvis Agudelo: Treating Elaine Middleton/Extender: Morene Antu in Treatment: 3 Visit Information History Since Last Visit All ordered tests and consults were completed: Yes Patient Arrived: Ambulatory Added or deleted any medications: No Arrival Time: 09:37 Any new allergies or adverse reactions: No Accompanied By: None Had a fall or experienced change in No Transfer Assistance: None activities of daily living that may affect Patient Identification Verified: Yes risk of falls: Secondary Verification Process Completed: Yes Signs or symptoms of abuse/neglect since last visito No Patient Requires Transmission-Based Precautions: No Hospitalized since last visit: No Patient Has Alerts: No Implantable device outside of the clinic excluding No cellular tissue based products placed in the center since last visit: Pain Present Now: No Electronic Signature(s) Signed: 12/02/2022 2:14:27 PM By: Karl Bales EMT Entered By: Karl Bales on 12/02/2022 11:14:26 -------------------------------------------------------------------------------- Encounter Discharge Information Details Patient Name: Date of Service: [name] 12/02/2022 10:00 A M Medical Record Number: [id number] Patient Account Number: [id number] Date of Birth/Sex: Treating RN: 1940-11-19 (82 y.o. [name] Primary Care Aubrea Meixner: Charissa Bash Other Clinician: Karl Bales Referring Keiron Iodice: Treating Maebry Obrien/Extender: Morene Antu in Treatment:  3 Encounter Discharge Information Items Discharge Condition: Stable Ambulatory Status: Ambulatory Discharge Destination: Home Transportation: Private Auto Accompanied By: None Schedule Follow-up Appointment: Yes Clinical Summary of Care: Electronic Signature(s) Signed: 12/02/2022 2:29:28 PM By: Karl Bales EMT Entered By: Karl Bales on 12/02/2022 11:29:28 [name] ([id number]) [id number] Page 2 of 2 -------------------------------------------------------------------------------- Vitals Details Patient Name: Date of Service: CHAIS, FEHRINGER 12/02/2022 10:00 A M Medical Record Number: [id number] Patient Account Number: [id number] Date of Birth/Sex: Treating RN: 12/28/1940 (82 y.o. [name], Millard.Loa Primary Care Jennamarie Goings: Charissa Bash Other Clinician: Karl Bales Referring Coleen Cardiff: Treating Sriram Febles/Extender: Morene Antu in Treatment: 3 Vital Signs Time Taken: 10:13 Temperature (F): 97.3 Height (in): 71 Pulse (bpm): 83 Weight (lbs): 180 Respiratory Rate (breaths/min): 16 Body Mass Index (BMI): 25.1 Blood Pressure (mmHg): 143/80 Reference Range: 80 - 120 mg / dl Electronic Signature(s) Signed: 12/02/2022 2:15:44 PM By: Karl Bales EMT Entered By: Karl Bales on 12/02/2022 11:15:44

## 2022-12-03 ENCOUNTER — Encounter (HOSPITAL_BASED_OUTPATIENT_CLINIC_OR_DEPARTMENT_OTHER): Payer: Medicare HMO | Admitting: Internal Medicine

## 2022-12-03 DIAGNOSIS — L598 Other specified disorders of the skin and subcutaneous tissue related to radiation: Secondary | ICD-10-CM | POA: Diagnosis not present

## 2022-12-03 DIAGNOSIS — Z923 Personal history of irradiation: Secondary | ICD-10-CM | POA: Diagnosis not present

## 2022-12-03 DIAGNOSIS — R351 Nocturia: Secondary | ICD-10-CM | POA: Diagnosis not present

## 2022-12-03 DIAGNOSIS — N3041 Irradiation cystitis with hematuria: Secondary | ICD-10-CM | POA: Diagnosis not present

## 2022-12-03 DIAGNOSIS — Z8546 Personal history of malignant neoplasm of prostate: Secondary | ICD-10-CM | POA: Diagnosis not present

## 2022-12-03 DIAGNOSIS — I10 Essential (primary) hypertension: Secondary | ICD-10-CM | POA: Diagnosis not present

## 2022-12-03 NOTE — Progress Notes (Signed)
DENYM, RAHIMI ([id number]) [id number] Page 1 of 2 Visit Report for 12/03/2022 Arrival Information Details Patient Name: Date of Service: [name], [name] 12/03/2022 10:00 A M Medical Record Number: [id number] Patient Account Number: [id number] Date of Birth/Sex: Treating RN: July 05, 1940 (82 y.o. [name], [name] Primary Care Donata Reddick: Charissa Bash Other Clinician: Haywood Pao Referring Darleen Moffitt: Treating Anne Boltz/Extender: Morene Antu in Treatment: 3 Visit Information History Since Last Visit All ordered tests and consults were completed: Yes Patient Arrived: Ambulatory Added or deleted any medications: No Arrival Time: 09:37 Any new allergies or adverse reactions: No Accompanied By: self Had a fall or experienced change in No Transfer Assistance: None activities of daily living that may affect Patient Identification Verified: Yes risk of falls: Secondary Verification Process Completed: Yes Signs or symptoms of abuse/neglect since last visito No Patient Requires Transmission-Based Precautions: No Hospitalized since last visit: No Patient Has Alerts: No Implantable device outside of the clinic excluding No cellular tissue based products placed in the center since last visit: Pain Present Now: No Electronic Signature(s) Signed: 12/03/2022 2:23:37 PM By: Haywood Pao CHT EMT BS , , Entered By: Haywood Pao on 12/03/2022 11:23:37 -------------------------------------------------------------------------------- Encounter Discharge Information Details Patient Name: Date of Service: [name]. 12/03/2022 10:00 A M Medical Record Number: [id number] Patient Account Number: [id number] Date of Birth/Sex: Treating RN: [birth year] (82 y.o. [name] Primary Care Anatalia Kronk: Charissa Bash Other Clinician: Haywood Pao Referring Jacorey Donaway: Treating Janathan Bribiesca/Extender: Morene Antu in Treatment: 3 Encounter Discharge Information Items Discharge Condition: Stable Ambulatory Status: Ambulatory Discharge Destination: Home Transportation: Private Auto Accompanied By: self Schedule Follow-up Appointment: No Clinical Summary of Care: Electronic Signature(s) Signed: 12/03/2022 2:38:33 PM By: Haywood Pao CHT EMT BS , , Entered By: Haywood Pao on 12/03/2022 11:38:33 [name] ([id number]) [id number] Page 2 of 2 -------------------------------------------------------------------------------- Vitals Details Patient Name: Date of Service: [name], [name] 12/03/2022 10:00 A M Medical Record Number: [id number] Patient Account Number: [id number] Date of Birth/Sex: Treating RN: [birth year] (82 y.o. [name] Primary Care Zacharius Funari: Charissa Bash Other Clinician: Haywood Pao Referring Velisa Regnier: Treating Bryana Froemming/Extender: Morene Antu in Treatment: 3 Vital Signs Time Taken: 09:47 Temperature (F): 97.7 Height (in): 71 Pulse (bpm): 55 Weight (lbs): 180 Respiratory Rate (breaths/min): 18 Body Mass Index (BMI): 25.1 Blood Pressure (mmHg): 122/69 Reference Range: 80 - 120 mg / dl Electronic Signature(s) Signed: 12/03/2022 2:30:38 PM By: Haywood Pao CHT EMT BS , , Entered By: Haywood Pao on 12/03/2022 11:30:38

## 2022-12-03 NOTE — Progress Notes (Signed)
[name], [name] ([id number]) [id number] Page 1 of 2 Visit Report for 12/03/2022 HBO Details Patient Name: Date of Service: [name], [name] 12/03/2022 10:00 A M Medical Record Number: [id number] Patient Account Number: [id number] Date of Birth/Sex: Treating RN: 04-08-1940 (82 y.o. [name] Primary Care Braniya Farrugia: Charissa Bash Other Clinician: Haywood Pao Referring Orvetta Danielski: Treating Ka Bench/Extender: Morene Antu in Treatment: 3 HBO Treatment Course Details Treatment Course Number: 1 Ordering Dorr Perrot: Lenda Kelp T Treatments Ordered: otal 40 HBO Treatment Start Date: 11/11/2022 HBO Indication: Late Effect of Radiation HBO Treatment Details Treatment Number: 17 Patient Type: Outpatient Chamber Type: Monoplace Chamber Serial #: [id number] Treatment Protocol: 2.0 ATA with 90 minutes oxygen, and no air breaks Treatment Details Compression Rate Down: 1.5 psi / minute De-Compression Rate Up: 2.0 psi / minute Air breaks and breathing Decompress Decompress Compress Tx Pressure Begins Reached periods Begins Ends (leave unused spaces blank) Chamber Pressure (ATA 1 2 ------2 1 ) Clock Time (24 hr) 10:48 11:01 - - - - - - 12:31 12:39 Treatment Length: 111 (minutes) Treatment Segments: 4 Vital Signs Capillary Blood Glucose Reference Range: 80 - 120 mg / dl HBO Diabetic Blood Glucose Intervention Range: <131 mg/dl or >563 mg/dl Type: Time Vitals Blood Respiratory Capillary Blood Glucose Pulse Action Pulse: Temperature: Taken: Pressure: Rate: Glucose (mg/dl): Meter #: Oximetry (%) Taken: Pre 09:47 122/69 55 18 97.7 none per protocol Post 12:42 159/83 49 18 97.2 Treatment Response Treatment Toleration: Well Treatment Completion Status: Treatment Completed without Adverse Event Treatment Notes Mr. Vale arrived with normal vitals except for 55 bpm pulse rate. He denied symptoms of bradycardia. He  prepared for treatment. After performing a safety check, he was placed in the chamber which was compressed with 100% oxygen at a rate of 2 psi/min after confirming normal ear equalization. He tolerated the treatment and subsequent decompression at a rate of 2 psi/min. He denied issues with ear equalization and/or pain associated with barotrauma. Post-treatment vitals were normal except for heart rate of 49. He again denied symptoms associated with bradycardia, stating that he felt good. He was stable upon discharge. Glendoris Nodarse Notes No concern with treatment given Physician HBO Attestation: I certify that I supervised this HBO treatment in accordance with Medicare guidelines. A trained emergency response team is readily available per Yes hospital policies and procedures. Continue HBOT as ordered. [address] APOLINAR, BERO ([id number]) [id number] Page 2 of 2 Signed: 12/03/2022 4:32:14 PM By: Baltazar Najjar MD Previous Signature: 12/03/2022 2:37:11 PM Version By: Haywood Pao CHT EMT BS , , Entered By: Baltazar Najjar on 12/03/2022 16:25:26 -------------------------------------------------------------------------------- HBO Safety Checklist Details Patient Name: Date of Service: [name]. 12/03/2022 10:00 A M Medical Record Number: [id number] Patient Account Number: [id number] Date of Birth/Sex: Treating RN: [birth year] (82 y.o. [name], [name] Primary Care Treavor Blomquist: Charissa Bash Other Clinician: Haywood Pao Referring Renner Sebald: Treating Mercy Malena/Extender: Morene Antu in Treatment: 3 HBO Safety Checklist Items Safety Checklist Consent Form Signed Patient voided / foley secured and emptied When did you last eato Breakfast Last dose of injectable or oral agent n/a Ostomy pouch emptied and vented if applicable NA All implantable devices assessed, documented and approved NA Intravenous access site  secured and place NA Valuables secured Linens and cotton and cotton/polyester blend (less than 51% polyester) Personal oil-based products / skin lotions / body lotions removed Wigs or hairpieces removed NA Smoking or tobacco materials removed NA Books / newspapers / magazines / loose paper  removed Cologne, aftershave, perfume and deodorant removed Jewelry removed (may wrap wedding band) Make-up removed NA Hair care products removed Battery operated devices (external) removed Heating patches and chemical warmers removed Titanium eyewear removed Nail polish cured greater than 10 hours NA Casting material cured greater than 10 hours NA Hearing aids removed NA Loose dentures or partials removed dentures removed Prosthetics have been removed NA Patient demonstrates correct use of air break device (if applicable) Patient concerns have been addressed Patient grounding bracelet on and cord attached to chamber Specifics for Inpatients (complete in addition to above) Medication sheet sent with patient NA Intravenous medications needed or due during therapy sent with patient NA Drainage tubes (e.g. nasogastric tube or chest tube secured and vented) NA Endotracheal or Tracheotomy tube secured NA Cuff deflated of air and inflated with saline NA Airway suctioned NA Notes Paper version used prior to treatment start. Electronic Signature(s) Signed: 12/03/2022 2:32:37 PM By: Haywood Pao CHT EMT BS , , Entered By: Haywood Pao on 12/03/2022 14:32:37

## 2022-12-04 ENCOUNTER — Encounter (HOSPITAL_BASED_OUTPATIENT_CLINIC_OR_DEPARTMENT_OTHER): Payer: Medicare HMO | Admitting: Internal Medicine

## 2022-12-04 DIAGNOSIS — Z8546 Personal history of malignant neoplasm of prostate: Secondary | ICD-10-CM | POA: Diagnosis not present

## 2022-12-04 DIAGNOSIS — L598 Other specified disorders of the skin and subcutaneous tissue related to radiation: Secondary | ICD-10-CM | POA: Diagnosis not present

## 2022-12-04 DIAGNOSIS — N3041 Irradiation cystitis with hematuria: Secondary | ICD-10-CM | POA: Diagnosis not present

## 2022-12-04 DIAGNOSIS — I10 Essential (primary) hypertension: Secondary | ICD-10-CM | POA: Diagnosis not present

## 2022-12-04 DIAGNOSIS — R351 Nocturia: Secondary | ICD-10-CM | POA: Diagnosis not present

## 2022-12-04 DIAGNOSIS — Z923 Personal history of irradiation: Secondary | ICD-10-CM | POA: Diagnosis not present

## 2022-12-04 NOTE — Progress Notes (Signed)
[name], [name] ([id number]) [id number] Page 1 of 2 Visit Report for 12/04/2022 Problem List Details Patient Name: Date of Service: [name], [name] 12/04/2022 10:00 A M Medical Record Number: [id number] Patient Account Number: [id number] Date of Birth/Sex: Treating RN: 26-Aug-1940 (83 y.o. [name] Primary Care Provider: Charissa Bash Other Clinician: Karl Bales Referring Provider: Treating Provider/Extender: Morene Antu in Treatment: 4 Active Problems ICD-10 Encounter Code Description Active Date MDM Diagnosis N30.41 Irradiation cystitis with hematuria 11/06/2022 No Yes R35.1 Nocturia 11/06/2022 No Yes I10 Essential (primary) hypertension 11/06/2022 No Yes Inactive Problems Resolved Problems Electronic Signature(s) Signed: 12/04/2022 2:33:53 PM By: Karl Bales EMT Signed: 12/04/2022 4:54:50 PM By: Baltazar Najjar MD Entered By: Karl Bales on 12/04/2022 14:33:53 -------------------------------------------------------------------------------- SuperBill Details Patient Name: Date of Service: [name] Riffle. 12/04/2022 Medical Record Number: [id number] Patient Account Number: [id number] Date of Birth/Sex: Treating RN: Jan 07, 1941 (82 y.o. [name] Primary Care Provider: Charissa Bash Other Clinician: Karl Bales Referring Provider: Treating Provider/Extender: Morene Antu in Treatment: 4 Diagnosis Coding ICD-10 Codes Code Description N30.41 Irradiation cystitis with hematuria R35.1 Nocturia I10 Essential (primary) hypertension Facility Procedures DAVID, RODRIQUEZ ([id number]): CPT4 Code Description Modifier Quantity [id number] G0277-(Facility Use Only) HBOT full body chamber, , 4 ICD-10 Diagnosis Description N30.41 Irradiation cystitis with hematuria R35.1 Nocturia [id number] Page 2 of 2: Physician Procedures : CPT4 Code  Description Modifier [id number] - WC PHYS HYPERBARIC OXYGEN THERAPY ICD-10 Diagnosis Description N30.41 Irradiation cystitis with hematuria R35.1 Nocturia Quantity: 1 Electronic Signature(s) Signed: 12/04/2022 2:33:48 PM By: Karl Bales EMT Signed: 12/04/2022 4:54:50 PM By: Baltazar Najjar MD Entered By: Karl Bales on 12/04/2022 14:33:48

## 2022-12-04 NOTE — Progress Notes (Addendum)
[name], [name] ([id number]) [id number] Page 1 of 2 Visit Report for 12/04/2022 HBO Details Patient Name: Date of Service: [name], [name] 12/04/2022 10:00 A M Medical Record Number: [id number] Patient Account Number: [id number] Date of Birth/Sex: Treating RN: Oct 23, 1940 (82 y.o. [name] Primary Care Dashonda Bonneau: Charissa Bash Other Clinician: Karl Bales Referring Lexis Potenza: Treating Cylis Ayars/Extender: Morene Antu in Treatment: 4 HBO Treatment Course Details Treatment Course Number: 1 Ordering Michie Molnar: Lenda Kelp T Treatments Ordered: otal 40 HBO Treatment Start Date: 11/11/2022 HBO Indication: Late Effect of Radiation HBO Treatment Details Treatment Number: 18 Patient Type: Outpatient Chamber Type: Monoplace Chamber Serial #: [id number] Treatment Protocol: 2.0 ATA with 90 minutes oxygen, and no air breaks Treatment Details Compression Rate Down: 2.0 psi / minute De-Compression Rate Up: 2.0 psi / minute Air breaks and breathing Decompress Decompress Compress Tx Pressure Begins Reached periods Begins Ends (leave unused spaces blank) Chamber Pressure (ATA 1 2 ------2 1 ) Clock Time (24 hr) 10:14 10:22 - - - - - - 11:52 12:00 Treatment Length: 106 (minutes) Treatment Segments: 4 Vital Signs Capillary Blood Glucose Reference Range: 80 - 120 mg / dl HBO Diabetic Blood Glucose Intervention Range: <131 mg/dl or >962 mg/dl Time Vitals Blood Respiratory Capillary Blood Glucose Pulse Action Type: Pulse: Temperature: Taken: Pressure: Rate: Glucose (mg/dl): Meter #: Oximetry (%) Taken: Pre 09:48 113/76 58 18 97.8 Post 12:04 149/68 52 16 98.4 Treatment Response Treatment Toleration: Well Treatment Completion Status: Treatment Completed without Adverse Event Gilman Olazabal Notes No concerns with treatment given. Patient was also seen for 30-day evaluation Physician HBO Attestation: I certify that I supervised  this HBO treatment in accordance with Medicare guidelines. A trained emergency response team is readily available per Yes hospital policies and procedures. Continue HBOT as ordered. Yes Electronic Signature(s) Signed: 12/04/2022 4:54:50 PM By: Baltazar Najjar MD Previous Signature: 12/04/2022 2:33:31 PM Version By: Karl Bales EMT Entered By: Baltazar Najjar on 12/04/2022 16:51:50 [name] ([id number]) [id number] Page 2 of 2 -------------------------------------------------------------------------------- HBO Safety Checklist Details Patient Name: Date of Service: [name] 12/04/2022 10:00 A M Medical Record Number: [id number] Patient Account Number: [id number] Date of Birth/Sex: Treating RN: September 12, 1940 (82 y.o. [name] Primary Care Joury Allcorn: Charissa Bash Other Clinician: Karl Bales Referring Naja Apperson: Treating Jeaneane Adamec/Extender: Morene Antu in Treatment: 4 HBO Safety Checklist Items Safety Checklist Consent Form Signed Patient voided / foley secured and emptied When did you last eato 0745 Last dose of injectable or oral agent NA Ostomy pouch emptied and vented if applicable NA All implantable devices assessed, documented and approved NA Intravenous access site secured and place NA Valuables secured Linens and cotton and cotton/polyester blend (less than 51% polyester) Personal oil-based products / skin lotions / body lotions removed Wigs or hairpieces removed NA Smoking or tobacco materials removed Books / newspapers / magazines / loose paper removed Cologne, aftershave, perfume and deodorant removed Jewelry removed (may wrap wedding band) Make-up removed NA Hair care products removed Battery operated devices (external) removed Heating patches and chemical warmers removed Titanium eyewear removed NA Nail polish cured greater than 10 hours NA Casting material cured greater than 10  hours NA Hearing aids removed NA Loose dentures or partials removed Prosthetics have been removed NA Patient demonstrates correct use of air break device (if applicable) Patient concerns have been addressed Patient grounding bracelet on and cord attached to chamber Specifics for Inpatients (complete in addition to above) Medication sheet sent with patient NA  Intravenous medications needed or due during therapy sent with patient NA Drainage tubes (e.g. nasogastric tube or chest tube secured and vented) NA Endotracheal or Tracheotomy tube secured NA Cuff deflated of air and inflated with saline NA Airway suctioned NA Notes The safety checklist was done before the treatment was started. Electronic Signature(s) Signed: 12/04/2022 2:32:05 PM By: Karl Bales EMT Entered By: Karl Bales on 12/04/2022 14:32:05

## 2022-12-04 NOTE — Progress Notes (Signed)
[name], [name] ([id number]) [id number] Page 1 of 1 Visit Report for 12/03/2022 SuperBill Details Patient Name: Date of Service: [name], [name] 12/03/2022 Medical Record Number: [id number] Patient Account Number: [id number] Date of Birth/Sex: Treating RN: Jun 21, 1940 (82 y.o. [name] Primary Care Provider: Charissa Bash Other Clinician: Haywood Pao Referring Provider: Treating Provider/Extender: Morene Antu in Treatment: 3 Diagnosis Coding ICD-10 Codes Code Description N30.41 Irradiation cystitis with hematuria R35.1 Nocturia I10 Essential (primary) hypertension Facility Procedures CPT4 Code Description Modifier Quantity [id number] G0277-(Facility Use Only) HBOT full body chamber, , 4 ICD-10 Diagnosis Description N30.41 Irradiation cystitis with hematuria R35.1 Nocturia I10 Essential (primary) hypertension Physician Procedures Quantity CPT4 Code Description Modifier [id number] 99183 - WC PHYS HYPERBARIC OXYGEN THERAPY 1 ICD-10 Diagnosis Description N30.41 Irradiation cystitis with hematuria R35.1 Nocturia I10 Essential (primary) hypertension Electronic Signature(s) Signed: 12/03/2022 2:37:48 PM By: Haywood Pao CHT EMT BS , , Signed: 12/03/2022 4:32:14 PM By: Baltazar Najjar MD Entered By: Haywood Pao on 12/03/2022 14:37:48

## 2022-12-04 NOTE — Progress Notes (Signed)
MAXIMILLION, GILL ([id number]) [id number] Page 1 of 2 Visit Report for 12/04/2022 Arrival Information Details Patient Name: Date of Service: KEANON, BEVINS 12/04/2022 10:00 A M Medical Record Number: [id number] Patient Account Number: [id number] Date of Birth/Sex: Treating RN: Jun 30, 1940 (82 y.o. [name] Primary Care Adley Castello: Charissa Bash Other Clinician: Karl Bales Referring Shatona Andujar: Treating Virlee Stroschein/Extender: Morene Antu in Treatment: 4 Visit Information History Since Last Visit All ordered tests and consults were completed: Yes Patient Arrived: Ambulatory Added or deleted any medications: No Arrival Time: 09:46 Any new allergies or adverse reactions: No Accompanied By: None Had a fall or experienced change in No Transfer Assistance: None activities of daily living that may affect Patient Identification Verified: Yes risk of falls: Secondary Verification Process Completed: Yes Signs or symptoms of abuse/neglect since last visito No Patient Requires Transmission-Based Precautions: No Hospitalized since last visit: No Patient Has Alerts: No Implantable device outside of the clinic excluding No cellular tissue based products placed in the center since last visit: Pain Present Now: No Electronic Signature(s) Signed: 12/04/2022 2:30:52 PM By: Karl Bales EMT Entered By: Karl Bales on 12/04/2022 11:30:51 -------------------------------------------------------------------------------- Encounter Discharge Information Details Patient Name: Date of Service: [name] 12/04/2022 10:00 A M Medical Record Number: [id number] Patient Account Number: [id number] Date of Birth/Sex: Treating RN: Jul 28, 1940 (82 y.o. [name] Primary Care Zamarion Longest: Charissa Bash Other Clinician: Karl Bales Referring Sheritha Louis: Treating Mettie Roylance/Extender: Morene Antu in Treatment:  4 Encounter Discharge Information Items Discharge Condition: Stable Ambulatory Status: Ambulatory Discharge Destination: Home Transportation: Private Auto Accompanied By: None Schedule Follow-up Appointment: Yes Clinical Summary of Care: Electronic Signature(s) Signed: 12/04/2022 2:34:21 PM By: Karl Bales EMT Entered By: Karl Bales on 12/04/2022 11:34:21 [name] ([id number]) [id number] Page 2 of 2 -------------------------------------------------------------------------------- Vitals Details Patient Name: Date of Service: LEODAN, BOLYARD 12/04/2022 10:00 A M Medical Record Number: [id number] Patient Account Number: [id number] Date of Birth/Sex: Treating RN: 1940/02/22 (82 y.o. [name] Primary Care Duard Spiewak: Charissa Bash Other Clinician: Karl Bales Referring Rodel Glaspy: Treating Jenisse Vullo/Extender: Morene Antu in Treatment: 4 Vital Signs Time Taken: 09:48 Temperature (F): 97.8 Height (in): 71 Pulse (bpm): 58 Weight (lbs): 180 Respiratory Rate (breaths/min): 18 Body Mass Index (BMI): 25.1 Blood Pressure (mmHg): 113/76 Reference Range: 80 - 120 mg / dl Electronic Signature(s) Signed: 12/04/2022 2:31:17 PM By: Karl Bales EMT Entered By: Karl Bales on 12/04/2022 11:31:17

## 2022-12-05 ENCOUNTER — Encounter (HOSPITAL_BASED_OUTPATIENT_CLINIC_OR_DEPARTMENT_OTHER): Payer: Medicare HMO | Admitting: Internal Medicine

## 2022-12-05 DIAGNOSIS — C61 Malignant neoplasm of prostate: Secondary | ICD-10-CM | POA: Diagnosis not present

## 2022-12-05 DIAGNOSIS — L598 Other specified disorders of the skin and subcutaneous tissue related to radiation: Secondary | ICD-10-CM | POA: Diagnosis not present

## 2022-12-05 DIAGNOSIS — I10 Essential (primary) hypertension: Secondary | ICD-10-CM | POA: Diagnosis not present

## 2022-12-05 DIAGNOSIS — R351 Nocturia: Secondary | ICD-10-CM | POA: Diagnosis not present

## 2022-12-05 DIAGNOSIS — Z8546 Personal history of malignant neoplasm of prostate: Secondary | ICD-10-CM | POA: Diagnosis not present

## 2022-12-05 DIAGNOSIS — N3041 Irradiation cystitis with hematuria: Secondary | ICD-10-CM | POA: Diagnosis not present

## 2022-12-05 DIAGNOSIS — Z923 Personal history of irradiation: Secondary | ICD-10-CM | POA: Diagnosis not present

## 2022-12-05 NOTE — Progress Notes (Addendum)
[name] ([id number]) [id number] Page 1 of 2 Visit Report for 12/05/2022 HBO Details Patient Name: Date of Service: [name] 12/05/2022 10:00 A M Medical Record Number: [id number] Patient Account Number: [id number] Date of Birth/Sex: Treating RN: [birth year] (82 y.o. [name] Primary Care [name]: [name] Other Clinician: Karl [name] Referring [name]: Treating [name]: [name] in Treatment: 4 HBO Treatment Course Details Treatment Course Number: 1 Ordering Kadarius Cuffe: Lenda Kelp T Treatments Ordered: otal 40 HBO Treatment Start Date: 11/11/2022 HBO Indication: Late Effect of Radiation HBO Treatment Details Treatment Number: 19 Patient Type: Outpatient Chamber Type: Monoplace Chamber Serial #: [id number] Treatment Protocol: 2.0 ATA with 90 minutes oxygen, and no air breaks Treatment Details Compression Rate Down: 2.0 psi / minute De-Compression Rate Up: 2.0 psi / minute Air breaks and breathing Decompress Decompress Compress Tx Pressure Begins Reached periods Begins Ends (leave unused spaces blank) Chamber Pressure (ATA 1 2 ------2 1 ) Clock Time (24 hr) 10:36 10:47 - - - - - - 12:17 12:25 Treatment Length: 109 (minutes) Treatment Segments: 4 Vital Signs Capillary Blood Glucose Reference Range: 80 - 120 mg / dl HBO Diabetic Blood Glucose Intervention Range: <131 mg/dl or >629 mg/dl Time Vitals Blood Respiratory Capillary Blood Glucose Pulse Action Type: Pulse: Temperature: Taken: Pressure: Rate: Glucose (mg/dl): Meter #: Oximetry (%) Taken: Pre 09:38 106/57 58 18 97.2 Post 12:28 150/77 51 18 97.2 Treatment Response Treatment Toleration: Well Treatment Completion Status: Treatment Completed without Adverse Event [name] Notes No concerns with treatment given Physician HBO Attestation: I certify that I supervised this HBO treatment in accordance with  Medicare guidelines. A trained emergency response team is readily available per Yes hospital policies and procedures. Continue HBOT as ordered. Yes Electronic Signature(s) Signed: 12/05/2022 4:34:18 PM By: Baltazar Najjar MD Previous Signature: 12/05/2022 12:56:11 PM Version By: [name] EMT Entered By: Baltazar Najjar on 12/05/2022 16:20:34 [name] ([id number]) [id number] Page 2 of 2 -------------------------------------------------------------------------------- HBO Safety Checklist Details Patient Name: Date of Service: [name] 12/05/2022 10:00 A M Medical Record Number: [id number] Patient Account Number: [id number] Date of Birth/Sex: Treating RN: 1940-03-18 (82 y.o. [name] Primary Care Shebra Muldrow: [name] Other Clinician: Karl [name] Referring [name]: Treating [name]: [name] in Treatment: 4 HBO Safety Checklist Items Safety Checklist Consent Form Signed Patient voided / foley secured and emptied When did you last eato 0700 Last dose of injectable or oral agent NA Ostomy pouch emptied and vented if applicable NA All implantable devices assessed, documented and approved NA Intravenous access site secured and place NA Valuables secured Linens and cotton and cotton/polyester blend (less than 51% polyester) Personal oil-based products / skin lotions / body lotions removed Wigs or hairpieces removed NA Smoking or tobacco materials removed Books / newspapers / magazines / loose paper removed Cologne, aftershave, perfume and deodorant removed Jewelry removed (may wrap wedding band) Make-up removed NA Hair care products removed Battery operated devices (external) removed Heating patches and chemical warmers removed Titanium eyewear removed NA Nail polish cured greater than 10 hours NA Casting material cured greater than 10 hours NA Hearing aids  removed NA Loose dentures or partials removed removed by patient Prosthetics have been removed NA Patient demonstrates correct use of air break device (if applicable) Patient concerns have been addressed Patient grounding bracelet on and cord attached to chamber Specifics for Inpatients (complete in addition to above) Medication sheet sent with patient NA Intravenous medications needed or  due during therapy sent with patient NA Drainage tubes (e.g. nasogastric tube or chest tube secured and vented) NA Endotracheal or Tracheotomy tube secured NA Cuff deflated of air and inflated with saline NA Airway suctioned NA Notes The safety checklist was done before the treatment was started. Electronic Signature(s) Signed: 12/05/2022 11:54:53 AM By: [name] EMT Entered By: [name] on 12/05/2022 11:54:53

## 2022-12-05 NOTE — Progress Notes (Signed)
RAEQUAN, VANSCHAICK ([id number]) [id number] Page 1 of 2 Visit Report for 12/05/2022 Arrival Information Details Patient Name: Date of Service: ISMEAL, HEIDER 12/05/2022 10:00 A M Medical Record Number: [id number] Patient Account Number: [id number] Date of Birth/Sex: Treating RN: 10-May-1940 (82 y.o. [name] Primary Care Sim Choquette: Charissa Bash Other Clinician: Karl Bales Referring Brennley Curtice: Treating Babe Anthis/Extender: Morene Antu in Treatment: 4 Visit Information History Since Last Visit All ordered tests and consults were completed: Yes Patient Arrived: Ambulatory Added or deleted any medications: No Arrival Time: 09:25 Any new allergies or adverse reactions: No Accompanied By: None Had a fall or experienced change in No Transfer Assistance: None activities of daily living that may affect Patient Identification Verified: Yes risk of falls: Secondary Verification Process Completed: Yes Signs or symptoms of abuse/neglect since last visito No Patient Requires Transmission-Based Precautions: No Hospitalized since last visit: No Patient Has Alerts: No Implantable device outside of the clinic excluding No cellular tissue based products placed in the center since last visit: Pain Present Now: No Electronic Signature(s) Signed: 12/05/2022 11:52:44 AM By: Karl Bales EMT Entered By: Karl Bales on 12/05/2022 08:52:44 -------------------------------------------------------------------------------- Encounter Discharge Information Details Patient Name: Date of Service: [name] 12/05/2022 10:00 A M Medical Record Number: [id number] Patient Account Number: [id number] Date of Birth/Sex: Treating RN: 1940/10/29 (82 y.o. [name] Primary Care Yunuen Mordan: Charissa Bash Other Clinician: Karl Bales Referring Xitlalic Maslin: Treating Keylin Podolsky/Extender: Morene Antu in  Treatment: 4 Encounter Discharge Information Items Discharge Condition: Stable Ambulatory Status: Ambulatory Discharge Destination: Home Transportation: Private Auto Accompanied By: None Schedule Follow-up Appointment: Yes Clinical Summary of Care: Electronic Signature(s) Signed: 12/05/2022 12:57:23 PM By: Karl Bales EMT Entered By: Karl Bales on 12/05/2022 09:57:23 [name] ([id number]) [id number] Page 2 of 2 -------------------------------------------------------------------------------- Vitals Details Patient Name: Date of Service: JIBRIL, MCMINN 12/05/2022 10:00 A M Medical Record Number: [id number] Patient Account Number: [id number] Date of Birth/Sex: Treating RN: 09/06/1940 (82 y.o. [name] Primary Care Joslynn Jamroz: Charissa Bash Other Clinician: Karl Bales Referring Odean Fester: Treating Benson Porcaro/Extender: Morene Antu in Treatment: 4 Vital Signs Time Taken: 09:38 Temperature (F): 97.2 Height (in): 71 Pulse (bpm): 58 Weight (lbs): 180 Respiratory Rate (breaths/min): 18 Body Mass Index (BMI): 25.1 Blood Pressure (mmHg): 106/57 Reference Range: 80 - 120 mg / dl Electronic Signature(s) Signed: 12/05/2022 11:53:36 AM By: Karl Bales EMT Entered By: Karl Bales on 12/05/2022 08:53:36

## 2022-12-05 NOTE — Progress Notes (Signed)
[name], [name] ([id number]) [id number] Page 1 of 4 Visit Report for 12/04/2022 HPI Details Patient Name: Date of Service: [name], [name] 12/04/2022 3:15 PM Medical Record Number: [id number] Patient Account Number: [id number] Date of Birth/Sex: Treating RN: [birth year] (82 y.o. M) Primary Care Provider: Charissa Bash Other Clinician: Referring Provider: Treating Provider/Extender: Morene Antu in Treatment: 4 History of Present Illness HPI Description: 11-06-2022 upon evaluation today patient presents for initial inspection here in our clinic concerning issues that he has been having with bleeding, nocturia, and urinary frequency following having undergone radiation therapy for prostate cancer. The patient had radiation from 09-05-2021 through 8- 14-2023. There was a total of 75 Gy during the course of treatment. At the completion of treatment it was noted that the patient had no issues warranting further action that were identified which is excellent news. With that being said the patient has had multiple visits with his urologist. The most recent was apparently on October 24, 2022. At that visit it was noted that the patient had a previous infection with Enterococcus which had been successfully treated. And that was on 09-09-2022. On 10-17-2022 he was in the emergency department for a couple of days with worsening symptoms with regard to urinary issues. The culture at that point showed only 10,000 colonies of Enterococcus he was treated with Keflex for 4 times a day and that he was seen due to gross hematuria at the urology office. CT study in July showed that there were no stones or suspicious mass or lesions in the kidneys no obstructive signs there was bladder wall thickening and surrounding inflammation suggestive of cystitis. 4 days prior to the 19th this would have been around the 15th the patient did have issues with gross hematuria  with increases in frequency and urgency and dysuria. He has been trying to drink plenty of water but nonetheless this continues to be a back-and-forth issue that was not clearing. Subsequently the patient had a cystoscopy which was ordered for 10-24-2022 and this was positive for some moderate hyperplasia with regard to the prostate with hypervascularity although there was definitive findings of flat patches of radiation cystitis but no areas of urethral tumor which was good news. At that point the urinalysis showed blood and protein but no bacteria noted and there did not appear to be any white blood cells indicative of an infection. Subsequently it was recommended that hyperbaric oxygen therapy would be the ideal thing due to radiation cystitis noted on the cystoscopy. Since that time the patient tells me symptoms got a little bit better and then subsequently worse 2 days ago he was having quite a bit of blood and frequency along with nocturia. Last night he got up 3 times again this tends to still be an ongoing issue where he is seeing blood in his urine at least every 1 to 2 days sometimes worse than others. Patient does have a history of hypertension but at the same time has really no other major medical problems that would affect things here he is not on any blood thinners which is also good news. 12/04/2022; patient is seen for 30-day evaluation. He is tolerating hyperbarics well. His symptoms were bleeding, urinary frequency and dysuria Electronic Signature(s) Signed: 12/04/2022 4:54:50 PM By: Baltazar Najjar MD Entered By: Baltazar Najjar on 12/04/2022 09:56:30 -------------------------------------------------------------------------------- Physical Exam Details Patient Name: Date of Service: [name]. 12/04/2022 3:15 PM Medical Record Number: [id number] Patient Account Number: [id number] Date of Birth/Sex: Treating RN:  May 26, 1940 (82 y.o. M) Primary Care Provider: Charissa Bash  Other Clinician: Referring Provider: Treating Provider/Extender: Morene Antu in Treatment: 4 Notes 12/04/22; The patient states that his hematuria is basically resolved. He is on a couple of occasions where he had some pink discoloration which was probably some bleeding. His urinary frequency probably has not changed that much but he is not having any dysuria which is an improvement. Overall he feels that he is improved Electronic Signature(s) Signed: 12/04/2022 4:54:50 PM By: Baltazar Najjar MD Entered By: Baltazar Najjar on 12/04/2022 10:02:31 [name], [name] ([id number]) [id number] Page 2 of 4 -------------------------------------------------------------------------------- Physician Orders Details Patient Name: Date of Service: [name], [name] 12/04/2022 3:15 PM Medical Record Number: [id number] Patient Account Number: [id number] Date of Birth/Sex: Treating RN: October 24, 1940 (82 y.o. [name] Primary Care Provider: Charissa Bash Other Clinician: Referring Provider: Treating Provider/Extender: Morene Antu in Treatment: 4 The following information was scribed by: Shawn Stall The information was scribed for: Baltazar Najjar Verbal / Phone Orders: No Diagnosis Coding ICD-10 Coding Code Description N30.41 Irradiation cystitis with hematuria R35.1 Nocturia I10 Essential (primary) hypertension Follow-up Appointments ppointment in: - Dr. Leanord Hawking or Allen Derry PA [facility] office to schedule mid December. Return A Hyperbaric Oxygen Therapy Evaluate for HBO Therapy Indication: - Radiation Cystitis If appropriate for treatment, begin HBOT per protocol: 2.0 ATA for 90 Minutes without A Breaks ir Total Number of Treatments: - 40 A frin (Oxymetazoline HCL) 0.05% nasal spray - 1 spray in both nostrils daily as needed prior to HBO treatment for difficulty clearing ears Electronic Signature(s) Signed:  12/04/2022 4:54:50 PM By: Baltazar Najjar MD Signed: 12/04/2022 6:03:26 PM By: Shawn Stall RN, BSN Entered By: Shawn Stall on 12/04/2022 09:52:51 -------------------------------------------------------------------------------- Problem List Details Patient Name: Date of Service: [name]. 12/04/2022 3:15 PM Medical Record Number: [id number] Patient Account Number: [id number] Date of Birth/Sex: Treating RN: 1940-06-01 (82 y.o. [name] Primary Care Provider: Charissa Bash Other Clinician: Referring Provider: Treating Provider/Extender: Morene Antu in Treatment: 4 Active Problems ICD-10 Encounter Code Description Active Date MDM Diagnosis N30.41 Irradiation cystitis with hematuria 11/06/2022 No Yes R35.1 Nocturia 11/06/2022 No Yes I10 Essential (primary) hypertension 11/06/2022 No Yes [name], [name] ([id number]) [id number].pdf Page 3 of 4 Inactive Problems Resolved Problems Electronic Signature(s) Signed: 12/04/2022 4:54:50 PM By: Baltazar Najjar MD Entered By: Baltazar Najjar on 12/04/2022 09:55:22 -------------------------------------------------------------------------------- Progress Note Details Patient Name: Date of Service: [name]. 12/04/2022 3:15 PM Medical Record Number: [id number] Patient Account Number: [id number] Date of Birth/Sex: Treating RN: Feb 28, 1940 (82 y.o. M) Primary Care Provider: Charissa Bash Other Clinician: Referring Provider: Treating Provider/Extender: Morene Antu in Treatment: 4 Subjective History of Present Illness (HPI) 11-06-2022 upon evaluation today patient presents for initial inspection here in our clinic concerning issues that he has been having with bleeding, nocturia, and urinary frequency following having undergone radiation therapy for prostate cancer. The patient had radiation from 09-05-2021 through 09-10-2021. There was a total of 75 Gy  during the course of treatment. At the completion of treatment it was noted that the patient had no issues warranting further action that were identified which is excellent news. With that being said the patient has had multiple visits with his urologist. The most recent was apparently on October 24, 2022. At that visit it was noted that the patient had a previous infection with Enterococcus which had been successfully treated. And that was on 09-09-2022. On  10-17-2022 he was in the emergency department for a couple of days with worsening symptoms with regard to urinary issues. The culture at that point showed only 10,000 colonies of Enterococcus he was treated with Keflex for 4 times a day and that he was seen due to gross hematuria at the urology office. CT study in July showed that there were no stones or suspicious mass or lesions in the kidneys no obstructive signs there was bladder wall thickening and surrounding inflammation suggestive of cystitis. 4 days prior to the 19th this would have been around the 15th the patient did have issues with gross hematuria with increases in frequency and urgency and dysuria. He has been trying to drink plenty of water but nonetheless this continues to be a back-and-forth issue that was not clearing. Subsequently the patient had a cystoscopy which was ordered for 10-24-2022 and this was positive for some moderate hyperplasia with regard to the prostate with hypervascularity although there was definitive findings of flat patches of radiation cystitis but no areas of urethral tumor which was good news. At that point the urinalysis showed blood and protein but no bacteria noted and there did not appear to be any white blood cells indicative of an infection. Subsequently it was recommended that hyperbaric oxygen therapy would be the ideal thing due to radiation cystitis noted on the cystoscopy. Since that time the patient tells me symptoms got a little bit better and  then subsequently worse 2 days ago he was having quite a bit of blood and frequency along with nocturia. Last night he got up 3 times again this tends to still be an ongoing issue where he is seeing blood in his urine at least every 1 to 2 days sometimes worse than others. Patient does have a history of hypertension but at the same time has really no other major medical problems that would affect things here he is not on any blood thinners which is also good news. 12/04/2022; patient is seen for 30-day evaluation. He is tolerating hyperbarics well. His symptoms were bleeding, urinary frequency and dysuria Assessment Active Problems ICD-10 Irradiation cystitis with hematuria Nocturia Essential (primary) hypertension Plan Follow-up Appointments: Return Appointment in: - Dr. Leanord Hawking or Allen Derry PA [facility] office to schedule mid December. Hyperbaric Oxygen Therapy: Evaluate for HBO Therapy [name], [name] ([id number]) [phone].pdf Page 4 of 4 Indication: - Radiation Cystitis If appropriate for treatment, begin HBOT per protocol: 2.0 ATA for 90 Minutes without Air Breaks T Number of Treatments: - 40 otal Afrin (Oxymetazoline HCL) 0.05% nasal spray - 1 spray in both nostrils daily as needed prior to HBO treatment for difficulty clearing ears 1. We are continuing HBO. #2 significant improvement in hematuria and dysuria. Not so much in urinary frequency he is still up 3 times at night Electronic Signature(s) Signed: 12/04/2022 4:54:50 PM By: Baltazar Najjar MD Entered By: Baltazar Najjar on 12/04/2022 10:03:06 -------------------------------------------------------------------------------- SuperBill Details Patient Name: Date of Service: [name]. 12/04/2022 Medical Record Number: [id number] Patient Account Number: [id number] Date of Birth/Sex: Treating RN: 1941/01/04 (82 y.o. [name] Primary Care Provider: Charissa Bash Other  Clinician: Referring Provider: Treating Provider/Extender: Morene Antu in Treatment: 4 Diagnosis Coding ICD-10 Codes Code Description N30.41 Irradiation cystitis with hematuria R35.1 Nocturia I10 Essential (primary) hypertension Facility Procedures : CPT4 Code: [id number] Description: 44034 - WOUND CARE VISIT-LEV 2 EST PT Modifier: Quantity: 1 Physician Procedures : CPT4 Code Description Modifier [id number] - WC PHYS LEVEL 2 -  EST PT ICD-10 Diagnosis Description N30.41 Irradiation cystitis with hematuria R35.1 Nocturia Quantity: 1 Electronic Signature(s) Signed: 12/04/2022 4:54:50 PM By: Baltazar Najjar MD Entered By: Baltazar Najjar on 12/04/2022 10:03:24

## 2022-12-05 NOTE — Progress Notes (Signed)
DEMONTEZ, NOVACK ([id number]) [id number] Page 1 of 5 Visit Report for 12/04/2022 Arrival Information Details Patient Name: Date of Service: [name], [name] 12/04/2022 3:15 PM Medical Record Number: [id number] Patient Account Number: [id number] Date of Birth/Sex: Treating RN: 09-Jun-1940 (82 y.o. [name], Millard.Loa Primary Care Kesi Perrow: Charissa Bash Other Clinician: Referring Antavious Spanos: Treating Maleaha Hughett/Extender: Morene Antu in Treatment: 4 Visit Information History Since Last Visit Added or deleted any medications: No Patient Arrived: Ambulatory Any new allergies or adverse reactions: No Arrival Time: 12:42 Had a fall or experienced change in No Accompanied By: self activities of daily living that may affect Transfer Assistance: None risk of falls: Patient Identification Verified: Yes Signs or symptoms of abuse/neglect since last visito No Secondary Verification Process Completed: Yes Hospitalized since last visit: No Patient Requires Transmission-Based Precautions: No Implantable device outside of the clinic excluding No Patient Has Alerts: No cellular tissue based products placed in the center since last visit: Pain Present Now: No Electronic Signature(s) Signed: 12/04/2022 6:03:26 PM By: Shawn Stall RN, BSN Entered By: Shawn Stall on 12/04/2022 09:43:58 -------------------------------------------------------------------------------- Clinic Level of Care Assessment Details Patient Name: Date of Service: [name]. 12/04/2022 3:15 PM Medical Record Number: [id number] Patient Account Number: [id number] Date of Birth/Sex: Treating RN: 20-Jul-1940 (82 y.o. [name], Millard.Loa Primary Care Alfredia Desanctis: Charissa Bash Other Clinician: Referring Kelcey Korus: Treating Alexandrea Westergard/Extender: Morene Antu in Treatment: 4 Clinic Level of Care Assessment Items TOOL 4 Quantity Score X- 1 0 Use when only an  EandM is performed on FOLLOW-UP visit ASSESSMENTS - Nursing Assessment / Reassessment X- 1 10 Reassessment of Co-morbidities (includes updates in patient status) X- 1 5 Reassessment of Adherence to Treatment Plan ASSESSMENTS - Wound and Skin A ssessment / Reassessment []  - 0 Simple Wound Assessment / Reassessment - one wound []  - 0 Complex Wound Assessment / Reassessment - multiple wounds []  - 0 Dermatologic / Skin Assessment (not related to wound area) ASSESSMENTS - Focused Assessment []  - 0 Circumferential Edema Measurements - multi extremities []  - 0 Nutritional Assessment / Counseling / Intervention []  - 0 Lower Extremity Assessment (monofilament, tuning fork, pulses) DANIL, WEDGE ([id number]) [id number] Page 2 of 5 []  - 0 Peripheral Arterial Disease Assessment (using hand held doppler) ASSESSMENTS - Ostomy and/or Continence Assessment and Care []  - 0 Incontinence Assessment and Management []  - 0 Ostomy Care Assessment and Management (repouching, etc.) PROCESS - Coordination of Care X - Simple Patient / Family Education for ongoing care 1 15 []  - 0 Complex (extensive) Patient / Family Education for ongoing care []  - 0 Staff obtains Chiropractor, Records, T Results / Process Orders est []  - 0 Staff telephones HHA, Nursing Homes / Clarify orders / etc []  - 0 Routine Transfer to another Facility (non-emergent condition) []  - 0 Routine Hospital Admission (non-emergent condition) []  - 0 New Admissions / Manufacturing engineer / Ordering NPWT Apligraf, etc. , []  - 0 Emergency Hospital Admission (emergent condition) X- 1 10 Simple Discharge Coordination []  - 0 Complex (extensive) Discharge Coordination PROCESS - Special Needs []  - 0 Pediatric / Minor Patient Management []  - 0 Isolation Patient Management []  - 0 Hearing / Language / Visual special needs []  - 0 Assessment of Community assistance (transportation, D/C planning, etc.) []   - 0 Additional assistance / Altered mentation []  - 0 Support Surface(s) Assessment (bed, cushion, seat, etc.) INTERVENTIONS - Wound Cleansing / Measurement []  - 0 Simple Wound Cleansing - one wound []  - 0 Complex  Wound Cleansing - multiple wounds []  - 0 Wound Imaging (photographs - any number of wounds) []  - 0 Wound Tracing (instead of photographs) []  - 0 Simple Wound Measurement - one wound []  - 0 Complex Wound Measurement - multiple wounds INTERVENTIONS - Wound Dressings []  - 0 Small Wound Dressing one or multiple wounds []  - 0 Medium Wound Dressing one or multiple wounds []  - 0 Large Wound Dressing one or multiple wounds []  - 0 Application of Medications - topical []  - 0 Application of Medications - injection INTERVENTIONS - Miscellaneous []  - 0 External ear exam []  - 0 Specimen Collection (cultures, biopsies, blood, body fluids, etc.) []  - 0 Specimen(s) / Culture(s) sent or taken to Lab for analysis []  - 0 Patient Transfer (multiple staff / Nurse, adult / Similar devices) []  - 0 Simple Staple / Suture removal (25 or less) []  - 0 Complex Staple / Suture removal (26 or more) []  - 0 Hypo / Hyperglycemic Management (close monitor of Blood Glucose) []  - 0 Ankle / Brachial Index (ABI) - do not check if billed separately HODGES, TREIBER ([id number]) [id number] Page 3 of 5 []  - 0 Vital Signs Has the patient been seen at the hospital within the last three years: Yes Total Score: 40 Level Of Care: New/Established - Level 2 Electronic Signature(s) Signed: 12/04/2022 6:03:26 PM By: Shawn Stall RN, BSN Entered By: Shawn Stall on 12/04/2022 09:54:49 -------------------------------------------------------------------------------- Encounter Discharge Information Details Patient Name: Date of Service: [name]. 12/04/2022 3:15 PM Medical Record Number: [id number] Patient Account Number: [id number] Date of Birth/Sex: Treating  RN: [birth year] (82 y.o. [name] Primary Care Naiyana Barbian: Charissa Bash Other Clinician: Referring Hien Perreira: Treating Zell Hylton/Extender: Morene Antu in Treatment: 4 Encounter Discharge Information Items Discharge Condition: Stable Ambulatory Status: Ambulatory Discharge Destination: Home Transportation: Private Auto Accompanied By: self Schedule Follow-up Appointment: Yes Clinical Summary of Care: Electronic Signature(s) Signed: 12/04/2022 6:03:26 PM By: Shawn Stall RN, BSN Entered By: Shawn Stall on 12/04/2022 09:55:55 -------------------------------------------------------------------------------- Lower Extremity Assessment Details Patient Name: Date of Service: [name]. 12/04/2022 3:15 PM Medical Record Number: [id number] Patient Account Number: [id number] Date of Birth/Sex: Treating RN: April 30, 1940 (82 y.o. [name] Primary Care Varian Innes: Charissa Bash Other Clinician: Referring Riannon Mukherjee: Treating Lethia Donlon/Extender: Morene Antu in Treatment: 4 Electronic Signature(s) Signed: 12/04/2022 6:03:26 PM By: Shawn Stall RN, BSN Entered By: Shawn Stall on 12/04/2022 09:44:09 -------------------------------------------------------------------------------- Multi-Disciplinary Care Plan Details Patient Name: Date of Service: [name]. 12/04/2022 3:15 PM Medical Record Number: [id number] Patient Account Number: [id number] Date of Birth/Sex: Treating RN: 1940/07/16 (82 y.o. [name] [place], Hilbert Bible ([id number]) [id number] Page 4 of 5 Primary Care Tysheka Fanguy: Charissa Bash Other Clinician: Referring Verenise Moulin: Treating Alexyss Balzarini/Extender: Morene Antu in Treatment: 4 Active Inactive HBO Nursing Diagnoses: Anxiety related to feelings of confinement associated with the hyperbaric oxygen chamber Potential for barotraumas to ears, sinuses,  teeth, and lungs or cerebral gas embolism related to changes in atmospheric pressure inside hyperbaric oxygen chamber Goals: Barotrauma will be prevented during HBO2 Date Initiated: 11/06/2022 Target Resolution Date: 01/10/2023 Goal Status: Active Patient will tolerate the internal climate of the chamber Date Initiated: 11/06/2022 Target Resolution Date: 01/09/2023 Goal Status: Active Interventions: Assess and provide for patients comfort related to the hyperbaric environment and equalization of middle ear Notes: Orientation to the Wound Care Program Nursing Diagnoses: Knowledge deficit related to the wound healing center program Goals: Patient/caregiver will verbalize understanding of the  Wound Healing Center Program Date Initiated: 11/06/2022 Target Resolution Date: 01/09/2023 Goal Status: Active Interventions: Provide education on orientation to the wound center Notes: Electronic Signature(s) Signed: 12/04/2022 6:03:26 PM By: Shawn Stall RN, BSN Entered By: Shawn Stall on 12/04/2022 09:44:57 -------------------------------------------------------------------------------- Pain Assessment Details Patient Name: Date of Service: [name]. 12/04/2022 3:15 PM Medical Record Number: [id number] Patient Account Number: [id number] Date of Birth/Sex: Treating RN: 03/16/1940 (82 y.o. [name] Primary Care Norm Wray: Charissa Bash Other Clinician: Referring Hutson Luft: Treating Jaana Brodt/Extender: Morene Antu in Treatment: 4 Active Problems Location of Pain Severity and Description of Pain Patient Has Paino No Site Locations [place], [place] ([id number]) [id number] Page 5 of 5 Pain Management and Medication Current Pain Management: Electronic Signature(s) Signed: 12/04/2022 6:03:26 PM By: Shawn Stall RN, BSN Entered By: Shawn Stall on 12/04/2022  09:44:04 -------------------------------------------------------------------------------- Patient/Caregiver Education Details Patient Name: Date of Service: [name] 11/6/2024andnbsp3:15 PM Medical Record Number: [id number] Patient Account Number: [id number] Date of Birth/Gender: Treating RN: [birth year] (82 y.o. [name] Primary Care Physician: Charissa Bash Other Clinician: Referring Physician: Treating Physician/Extender: Morene Antu in Treatment: 4 Education Assessment Education Provided To: Patient Education Topics Provided Wound/Skin Impairment: Handouts: Caring for Your Ulcer Methods: Explain/Verbal Responses: Reinforcements needed Electronic Signature(s) Signed: 12/04/2022 6:03:26 PM By: Shawn Stall RN, BSN Entered By: Shawn Stall on 12/04/2022 09:51:25

## 2022-12-05 NOTE — Progress Notes (Signed)
AERON, DONAGHEY ([id number]) [id number] Page 1 of 2 Visit Report for 12/05/2022 Problem List Details Patient Name: Date of Service: [name], [name] 12/05/2022 10:00 A M Medical Record Number: [id number] Patient Account Number: [id number] Date of Birth/Sex: Treating RN: [birth year] (82 y.o. [name] Primary Care Provider: Charissa Bash Other Clinician: Karl Bales Referring Provider: Treating Provider/Extender: Morene Antu in Treatment: 4 Active Problems ICD-10 Encounter Code Description Active Date MDM Diagnosis N30.41 Irradiation cystitis with hematuria 11/06/2022 No Yes R35.1 Nocturia 11/06/2022 No Yes I10 Essential (primary) hypertension 11/06/2022 No Yes Inactive Problems Resolved Problems Electronic Signature(s) Signed: 12/05/2022 12:56:35 PM By: Karl Bales EMT Signed: 12/05/2022 4:34:18 PM By: Baltazar Najjar MD Entered By: Karl Bales on 12/05/2022 09:56:35 -------------------------------------------------------------------------------- SuperBill Details Patient Name: Date of Service: [name]. 12/05/2022 Medical Record Number: [id number] Patient Account Number: [id number] Date of Birth/Sex: Treating RN: September 28, 1940 (82 y.o. [name] Primary Care Provider: Charissa Bash Other Clinician: Karl Bales Referring Provider: Treating Provider/Extender: Morene Antu in Treatment: 4 Diagnosis Coding ICD-10 Codes Code Description N30.41 Irradiation cystitis with hematuria R35.1 Nocturia I10 Essential (primary) hypertension Facility Procedures [name], [name] ([id number]): CPT4 Code Description Modifier Quantity [id number] G0277-(Facility Use Only) HBOT full body chamber, , 4 ICD-10 Diagnosis Description N30.41 Irradiation cystitis with hematuria R35.1 Nocturia I10 Essential (primary)  hypertension [id number] Page 2 of  2: Physician Procedures : CPT4 Code Description Modifier [id number] [phone] WC PHYS HYPERBARIC OXYGEN THERAPY ICD-10 Diagnosis Description N30.41 Irradiation cystitis with hematuria R35.1 Nocturia I10 Essential (primary) hypertension Quantity: 1 Electronic Signature(s) Signed: 12/05/2022 12:56:29 PM By: Karl Bales EMT Signed: 12/05/2022 4:34:18 PM By: Baltazar Najjar MD Entered By: Karl Bales on 12/05/2022 09:56:29

## 2022-12-06 ENCOUNTER — Encounter (HOSPITAL_BASED_OUTPATIENT_CLINIC_OR_DEPARTMENT_OTHER): Payer: Medicare HMO | Admitting: General Surgery

## 2022-12-06 DIAGNOSIS — I10 Essential (primary) hypertension: Secondary | ICD-10-CM | POA: Diagnosis not present

## 2022-12-06 DIAGNOSIS — Z8546 Personal history of malignant neoplasm of prostate: Secondary | ICD-10-CM | POA: Diagnosis not present

## 2022-12-06 DIAGNOSIS — Z923 Personal history of irradiation: Secondary | ICD-10-CM | POA: Diagnosis not present

## 2022-12-06 DIAGNOSIS — L598 Other specified disorders of the skin and subcutaneous tissue related to radiation: Secondary | ICD-10-CM | POA: Diagnosis not present

## 2022-12-06 DIAGNOSIS — N3041 Irradiation cystitis with hematuria: Secondary | ICD-10-CM | POA: Diagnosis not present

## 2022-12-06 DIAGNOSIS — R351 Nocturia: Secondary | ICD-10-CM | POA: Diagnosis not present

## 2022-12-06 NOTE — Progress Notes (Signed)
[name] ([id number]) [id number] Page 1 of 1 Visit Report for 12/06/2022 Arrival Information Details Patient Name: Date of Service: [name], [name] 12/06/2022 7:30 A M Medical Record Number: [id number] Patient Account Number: [id number] Date of Birth/Sex: Treating RN: 1940-10-17 (82 y.o. [name], [name] Primary Care [name]: [name] Other Clinician: [name] Referring [name]: Treating [name]/Extender: [name] in Treatment: 4 Visit Information History Since Last Visit All ordered tests and consults were completed: Yes Patient Arrived: Ambulatory Added or deleted any medications: No Arrival Time: 07:29 Any new allergies or adverse reactions: No Accompanied By: self Had a fall or experienced change in No Transfer Assistance: None activities of daily living that may affect Patient Identification Verified: Yes risk of falls: Secondary Verification Process Completed: Yes Signs or symptoms of abuse/neglect since last visito No Patient Requires Transmission-Based Precautions: No Hospitalized since last visit: No Patient Has Alerts: No Implantable device outside of the clinic excluding No cellular tissue based products placed in the center since last visit: Pain Present Now: No Electronic Signature(s) Signed: 12/06/2022 9:07:23 AM By: [name] CHT EMT BS , , Entered By: [name] on 12/06/2022 06:07:23 -------------------------------------------------------------------------------- Vitals Details Patient Name: Date of Service: [name]. 12/06/2022 7:30 A M Medical Record Number: [id number] Patient Account Number: [id number] Date of Birth/Sex: Treating RN: 1940/11/30 (82 y.o. [name], [name] Primary Care [name]: [name] Other Clinician: [name] Referring [name]: Treating [name]/Extender: [name] in Treatment:  4 Vital Signs Time Taken: 07:50 Temperature (F): 97.9 Height (in): 71 Pulse (bpm): 55 Weight (lbs): 180 Respiratory Rate (breaths/min): 20 Body Mass Index (BMI): 25.1 Blood Pressure (mmHg): 114/56 Reference Range: 80 - 120 mg / dl Electronic Signature(s) Signed: 12/06/2022 9:07:55 AM By: [name] CHT EMT BS , , Entered By: [name] on 12/06/2022 06:07:55

## 2022-12-06 NOTE — Progress Notes (Signed)
ALHAJI, OSTERGREN ([id number]) [id number] Page 1 of 2 Visit Report for 12/06/2022 HBO Details Patient Name: Date of Service: [name], [name] 12/06/2022 7:30 A M Medical Record Number: [id number] Patient Account Number: [id number] Date of Birth/Sex: Treating RN: Jul 18, 1940 (82 y.o. [name], Millard.Loa Primary Care Elantra Caprara: Charissa Bash Other Clinician: Haywood Pao Referring Nicodemus Denk: Treating Zacharias Ridling/Extender: Claris Gower in Treatment: 4 HBO Treatment Course Details Treatment Course Number: 1 Ordering Jaloni Davoli: Lenda Kelp T Treatments Ordered: otal 40 HBO Treatment Start Date: 11/11/2022 HBO Indication: Late Effect of Radiation HBO Treatment Details Treatment Number: 20 Patient Type: Outpatient Chamber Type: Monoplace Chamber Serial #: [id number] Treatment Protocol: 2.0 ATA with 90 minutes oxygen, and no air breaks Treatment Details Compression Rate Down: 1.5 psi / minute De-Compression Rate Up: 2.0 psi / minute Air breaks and breathing Decompress Decompress Compress Tx Pressure Begins Reached periods Begins Ends (leave unused spaces blank) Chamber Pressure (ATA 1 2 ------2 1 ) Clock Time (24 hr) 07:54 08:08 - - - - - - 09:38 09:46 Treatment Length: 112 (minutes) Treatment Segments: 4 Vital Signs Capillary Blood Glucose Reference Range: 80 - 120 mg / dl HBO Diabetic Blood Glucose Intervention Range: <131 mg/dl or >782 mg/dl Type: Time Vitals Blood Respiratory Capillary Blood Glucose Pulse Action Pulse: Temperature: Taken: Pressure: Rate: Glucose (mg/dl): Meter #: Oximetry (%) Taken: Pre 07:50 114/56 55 20 97.9 none per protocol Post 09:49 141/69 49 18 98.1 none per protocol Treatment Response Treatment Toleration: Well Treatment Completion Status: Treatment Completed without Adverse Event Treatment Notes Mr. Woodke arrived and prepared for treatment. His vital signs were within normal range. After  performing a safety check, he was placed in the chamber, which was compressed with 100% oxygen at the rate of 2 psi/min after confirming normal ear equalization. He tolerated the treatment and subsequent decompression at the rate of 2 psi/min. Post-treatment vital signs were within normal range. He was stable upon discharge. Physician HBO Attestation: I certify that I supervised this HBO treatment in accordance with Medicare guidelines. A trained emergency response team is readily available per Yes hospital policies and procedures. Continue HBOT as ordered. Yes Electronic Signature(s) Signed: 12/09/2022 7:38:53 AM By: Duanne Guess MD FACS Previous Signature: 12/06/2022 10:55:29 AM Version By: Haywood Pao CHT EMT BS , , Entered By: Duanne Guess on 12/09/2022 04:38:52 [name] ([id number]) [id number] Page 2 of 2 -------------------------------------------------------------------------------- HBO Safety Checklist Details Patient Name: Date of Service: SANTOS, DELOZA 12/06/2022 7:30 A M Medical Record Number: [id number] Patient Account Number: [id number] Date of Birth/Sex: Treating RN: [birth year] (82 y.o. [name], Millard.Loa Primary Care Edgar Reisz: Charissa Bash Other Clinician: Haywood Pao Referring Briann Sarchet: Treating Jaquasha Carnevale/Extender: Claris Gower in Treatment: 4 HBO Safety Checklist Items Safety Checklist Consent Form Signed Patient voided / foley secured and emptied When did you last eato Breakfast Last dose of injectable or oral agent n/a Ostomy pouch emptied and vented if applicable NA All implantable devices assessed, documented and approved NA Intravenous access site secured and place NA Valuables secured Linens and cotton and cotton/polyester blend (less than 51% polyester) Personal oil-based products / skin lotions / body lotions removed Wigs or hairpieces removed NA Smoking or tobacco  materials removed NA Books / newspapers / magazines / loose paper removed Cologne, aftershave, perfume and deodorant removed Jewelry removed (may wrap wedding band) Make-up removed Hair care products removed Battery operated devices (external) removed Heating patches and chemical warmers removed Titanium eyewear removed Nail polish cured  greater than 10 hours NA Casting material cured greater than 10 hours NA Hearing aids removed removed Loose dentures or partials removed dentures removed Prosthetics have been removed NA Patient demonstrates correct use of air break device (if applicable) Patient concerns have been addressed Patient grounding bracelet on and cord attached to chamber Specifics for Inpatients (complete in addition to above) Medication sheet sent with patient NA Intravenous medications needed or due during therapy sent with patient NA Drainage tubes (e.g. nasogastric tube or chest tube secured and vented) NA Endotracheal or Tracheotomy tube secured NA Cuff deflated of air and inflated with saline NA Airway suctioned NA Notes Paper version used prior to treatment. Electronic Signature(s) Signed: 12/06/2022 10:50:15 AM By: Haywood Pao CHT EMT BS , , Entered By: Haywood Pao on 12/06/2022 07:50:15

## 2022-12-09 ENCOUNTER — Encounter (HOSPITAL_BASED_OUTPATIENT_CLINIC_OR_DEPARTMENT_OTHER): Payer: Medicare HMO | Admitting: Internal Medicine

## 2022-12-09 DIAGNOSIS — Z923 Personal history of irradiation: Secondary | ICD-10-CM | POA: Diagnosis not present

## 2022-12-09 DIAGNOSIS — I10 Essential (primary) hypertension: Secondary | ICD-10-CM | POA: Diagnosis not present

## 2022-12-09 DIAGNOSIS — R351 Nocturia: Secondary | ICD-10-CM | POA: Diagnosis not present

## 2022-12-09 DIAGNOSIS — Z8546 Personal history of malignant neoplasm of prostate: Secondary | ICD-10-CM | POA: Diagnosis not present

## 2022-12-09 DIAGNOSIS — L598 Other specified disorders of the skin and subcutaneous tissue related to radiation: Secondary | ICD-10-CM | POA: Diagnosis not present

## 2022-12-09 DIAGNOSIS — N3041 Irradiation cystitis with hematuria: Secondary | ICD-10-CM | POA: Diagnosis not present

## 2022-12-09 NOTE — Progress Notes (Signed)
[name] ([id number]) [id number] Page 1 of 2 Visit Report for 12/09/2022 Arrival Information Details Patient Name: Date of Service: [name], [name] 12/09/2022 7:30 A M Medical Record Number: [id number] Patient Account Number: [id number] Date of Birth/Sex: Treating RN: 02-22-1940 (82 y.o. [name], [name] Primary Care [place]: Charissa Bash Other Clinician: Haywood Pao Referring Ari Engelbrecht: Treating Kishana Battey/Extender: Morene Antu in Treatment: 4 Visit Information History Since Last Visit All ordered tests and consults were completed: Yes Patient Arrived: Ambulatory Added or deleted any medications: No Arrival Time: 07:34 Any new allergies or adverse reactions: No Accompanied By: self Had a fall or experienced change in No Transfer Assistance: None activities of daily living that may affect Patient Identification Verified: Yes risk of falls: Secondary Verification Process Completed: Yes Signs or symptoms of abuse/neglect since last visito No Patient Requires Transmission-Based Precautions: No Hospitalized since last visit: No Patient Has Alerts: No Implantable device outside of the clinic excluding No cellular tissue based products placed in the center since last visit: Pain Present Now: No Electronic Signature(s) Signed: 12/09/2022 11:28:52 AM By: Haywood Pao CHT EMT BS , , Entered By: Haywood Pao on 12/09/2022 11:28:52 -------------------------------------------------------------------------------- Encounter Discharge Information Details Patient Name: Date of Service: [name]. 12/09/2022 7:30 A M Medical Record Number: [id number] Patient Account Number: [id number] Date of Birth/Sex: Treating RN: 1940/03/28 (82 y.o. [name] Primary Care Dalten Ambrosino: Charissa Bash Other Clinician: Haywood Pao Referring Carmin Alvidrez: Treating Stacee Earp/Extender: Morene Antu in Treatment: 4 Encounter Discharge Information Items Discharge Condition: Stable Ambulatory Status: Ambulatory Discharge Destination: Home Transportation: Private Auto Accompanied By: self Schedule Follow-up Appointment: No Clinical Summary of Care: Electronic Signature(s) Signed: 12/09/2022 11:33:55 AM By: Haywood Pao CHT EMT BS , , Entered By: Haywood Pao on 12/09/2022 11:33:55 [name] ([id number]) [id number] Page 2 of 2 -------------------------------------------------------------------------------- Vitals Details Patient Name: Date of Service: [name], [name] 12/09/2022 7:30 A M Medical Record Number: [id number] Patient Account Number: [id number] Date of Birth/Sex: Treating RN: 14-Mar-1940 (82 y.o. [name] Primary Care Karolyn Messing: Charissa Bash Other Clinician: Haywood Pao Referring Aarin Sparkman: Treating Carleigh Buccieri/Extender: Morene Antu in Treatment: 4 Vital Signs Time Taken: 07:36 Temperature (F): 97.6 Height (in): 71 Pulse (bpm): 90 Weight (lbs): 180 Respiratory Rate (breaths/min): 18 Body Mass Index (BMI): 25.1 Blood Pressure (mmHg): 132/87 Reference Range: 80 - 120 mg / dl Electronic Signature(s) Signed: 12/09/2022 11:29:12 AM By: Haywood Pao CHT EMT BS , , Entered By: Haywood Pao on 12/09/2022 11:29:11

## 2022-12-09 NOTE — Progress Notes (Signed)
[name], [name] ([id number]) [id number] Page 1 of 1 Visit Report for 12/06/2022 SuperBill Details Patient Name: Date of Service: [name], [name] 12/06/2022 Medical Record Number: [id number] Patient Account Number: [id number] Date of Birth/Sex: Treating RN: October 03, 1940 (82 y.o. 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

## 2022-12-09 NOTE — Progress Notes (Addendum)
JOVANI, LABER ([id number]) [id number] Page 1 of 2 Visit Report for 12/09/2022 HBO Details Patient Name: Date of Service: [name], [name] 12/09/2022 7:30 A M Medical Record Number: [id number] Patient Account Number: [id number] Date of Birth/Sex: Treating RN: 1940-12-01 (82 y.o. [name] Primary Care Avelina Mcclurkin: Charissa Bash Other Clinician: Haywood Pao Referring Verner Kopischke: Treating Oneita Allmon/Extender: Morene Antu in Treatment: 4 HBO Treatment Course Details Treatment Course Number: 1 Ordering Geanna Divirgilio: Lenda Kelp T Treatments Ordered: otal 40 HBO Treatment Start Date: 11/11/2022 HBO Indication: Late Effect of Radiation HBO Treatment Details Treatment Number: 21 Patient Type: Outpatient Chamber Type: Monoplace Chamber Serial #: [id number] Treatment Protocol: 2.0 ATA with 90 minutes oxygen, and no air breaks Treatment Details Compression Rate Down: 1.5 psi / minute De-Compression Rate Up: 2.0 psi / minute Air breaks and breathing Decompress Decompress Compress Tx Pressure Begins Reached periods Begins Ends (leave unused spaces blank) Chamber Pressure (ATA 1 2 ------2 1 ) Clock Time (24 hr) 08:00 08:12 - - - - - - 09:42 09:52 Treatment Length: 112 (minutes) Treatment Segments: 4 Vital Signs Capillary Blood Glucose Reference Range: 80 - 120 mg / dl HBO Diabetic Blood Glucose Intervention Range: <131 mg/dl or >629 mg/dl Time Vitals Blood Respiratory Capillary Blood Glucose Pulse Action Type: Pulse: Temperature: Taken: Pressure: Rate: Glucose (mg/dl): Meter #: Oximetry (%) Taken: Pre 07:36 132/87 90 18 97.6 Post 09:58 146/74 51 18 97.8 Treatment Response Treatment Toleration: Well Treatment Completion Status: Treatment Completed without Adverse Event Treatment Notes [name] arrived and prepared for treatment. His vital signs were within normal range. After performing a safety check, he was  placed in the chamber, which was compressed with 100% oxygen at the rate of 2 psi/min after confirming normal ear equalization. He tolerated the treatment and subsequent decompression at the rate of 2 psi/min. He denied any issues with ear equalization and/or pain associated with barotrauma. Post-treatment vital signs were within normal range. He was stable upon discharge. Zyon Grout Notes No concern with treatment given Physician HBO Attestation: I certify that I supervised this HBO treatment in accordance with Medicare guidelines. A trained emergency response team is readily available per Yes hospital policies and procedures. Continue HBOT as ordered. Yes Electronic Signature(s) Signed: 12/09/2022 4:37:17 PM By: Baltazar Najjar MD Dolman, LACIE11/11/2022 4:37:17 PM By: Baltazar Najjar MD SignedKathie Rhodes ([id number]) [id number] Page 2 of 2 Previous Signature: 12/09/2022 11:32:53 AM Version By: Haywood Pao CHT EMT BS , , Entered By: Baltazar Najjar on 12/09/2022 13:33:53 -------------------------------------------------------------------------------- HBO Safety Checklist Details Patient Name: Date of Service: [name], [name] 12/09/2022 7:30 A M Medical Record Number: [id number] Patient Account Number: [id number] Date of Birth/Sex: Treating RN: Jun 24, 1940 (81 y.o. [name] Primary Care Tawna Alwin: Charissa Bash Other Clinician: Haywood Pao Referring Baron Parmelee: Treating Macyn Shropshire/Extender: Morene Antu in Treatment: 4 HBO Safety Checklist Items Safety Checklist Consent Form Signed Patient voided / foley secured and emptied When did you last eato Breakfast Last dose of injectable or oral agent n/a Ostomy pouch emptied and vented if applicable NA All implantable devices assessed, documented and approved NA Intravenous access site secured and place NA Valuables secured Linens and cotton and cotton/polyester blend  (less than 51% polyester) Personal oil-based products / skin lotions / body lotions removed Wigs or hairpieces removed NA Smoking or tobacco materials removed NA Books / newspapers / magazines / loose paper removed Cologne, aftershave, perfume and deodorant removed Jewelry removed (may wrap wedding band) Make-up removed NA Hair  care products removed Battery operated devices (external) removed Heating patches and chemical warmers removed Titanium eyewear removed Nail polish cured greater than 10 hours NA Casting material cured greater than 10 hours NA Hearing aids removed NA Loose dentures or partials removed NA Prosthetics have been removed NA Patient demonstrates correct use of air break device (if applicable) Patient concerns have been addressed Patient grounding bracelet on and cord attached to chamber Specifics for Inpatients (complete in addition to above) Medication sheet sent with patient NA Intravenous medications needed or due during therapy sent with patient NA Drainage tubes (e.g. nasogastric tube or chest tube secured and vented) NA Endotracheal or Tracheotomy tube secured NA Cuff deflated of air and inflated with saline NA Airway suctioned NA Notes Paper version used prior to treatment. Electronic Signature(s) Signed: 12/09/2022 11:30:49 AM By: Haywood Pao CHT EMT BS , , Entered By: Haywood Pao on 12/09/2022 08:30:49

## 2022-12-09 NOTE — Progress Notes (Signed)
[name], [name] ([id number]) [id number] Page 1 of 1 Visit Report for 12/09/2022 SuperBill Details Patient Name: Date of Service: [name], [name] 12/09/2022 Medical Record Number: [id number] Patient Account Number: [id number] Date of Birth/Sex: Treating RN: [birth year] (82 y.o. 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

## 2022-12-10 ENCOUNTER — Encounter (HOSPITAL_BASED_OUTPATIENT_CLINIC_OR_DEPARTMENT_OTHER): Payer: Medicare HMO | Admitting: Internal Medicine

## 2022-12-10 DIAGNOSIS — I10 Essential (primary) hypertension: Secondary | ICD-10-CM | POA: Diagnosis not present

## 2022-12-10 DIAGNOSIS — Z8546 Personal history of malignant neoplasm of prostate: Secondary | ICD-10-CM | POA: Diagnosis not present

## 2022-12-10 DIAGNOSIS — N3041 Irradiation cystitis with hematuria: Secondary | ICD-10-CM | POA: Diagnosis not present

## 2022-12-10 DIAGNOSIS — R351 Nocturia: Secondary | ICD-10-CM | POA: Diagnosis not present

## 2022-12-10 DIAGNOSIS — L598 Other specified disorders of the skin and subcutaneous tissue related to radiation: Secondary | ICD-10-CM | POA: Diagnosis not present

## 2022-12-10 DIAGNOSIS — Z923 Personal history of irradiation: Secondary | ICD-10-CM | POA: Diagnosis not present

## 2022-12-10 NOTE — Progress Notes (Signed)
[name] ([id number]) [id number] Page 1 of 2 Visit Report for 12/10/2022 Arrival Information Details Patient Name: Date of Service: [name] 12/10/2022 7:30 A M Medical Record Number: [id number] Patient Account Number: [id number] Date of Birth/Sex: Treating RN: May 10, 1940 (82 y.o. [name] Primary Care [name]: [name] Other Clinician: Karl [name] Referring [name]: Treating [name]/Extender: [name] in Treatment: 4 Visit Information History Since Last Visit All ordered tests and consults were completed: Yes Patient Arrived: Ambulatory Added or deleted any medications: No Arrival Time: 10:27 Any new allergies or adverse reactions: No Accompanied By: None Had a fall or experienced change in No Transfer Assistance: None activities of daily living that may affect Patient Identification Verified: Yes risk of falls: Secondary Verification Process Completed: Yes Signs or symptoms of abuse/neglect since last visito No Patient Requires Transmission-Based Precautions: No Hospitalized since last visit: No Patient Has Alerts: No Implantable device outside of the clinic excluding No cellular tissue based products placed in the center since last visit: Pain Present Now: No Electronic Signature(s) Signed: 12/10/2022 1:33:51 PM By: [name] EMT Entered By: [name] on 12/10/2022 10:33:51 -------------------------------------------------------------------------------- Encounter Discharge Information Details Patient Name: Date of Service: [name]. 12/10/2022 7:30 A M Medical Record Number: [id number] Patient Account Number: [id number] Date of Birth/Sex: Treating RN: 1940-02-26 (82 y.o. [name] Primary Care [name]: [name] Other Clinician: Karl [name] Referring [name]: Treating [name]/Extender: [name] in  Treatment: 4 Encounter Discharge Information Items Discharge Condition: Stable Ambulatory Status: Ambulatory Discharge Destination: Home Transportation: Private Auto Accompanied By: None Schedule Follow-up Appointment: Yes Clinical Summary of Care: Electronic Signature(s) Signed: 12/10/2022 1:48:20 PM By: [name] EMT Entered By: [name] on 12/10/2022 10:48:20 [name] ([id number]) [id number] Page 2 of 2 -------------------------------------------------------------------------------- Vitals Details Patient Name: Date of Service: [name] 12/10/2022 7:30 A M Medical Record Number: [id number] Patient Account Number: [id number] Date of Birth/Sex: Treating RN: 1940-03-12 (82 y.o. [name] Primary Care [name]: [name] Other Clinician: Karl [name] Referring [name]: Treating [name]/Extender: [name] in Treatment: 4 Vital Signs Time Taken: 07:53 Temperature (F): 97.4 Height (in): 71 Pulse (bpm): 55 Weight (lbs): 180 Respiratory Rate (breaths/min): 18 Body Mass Index (BMI): 25.1 Blood Pressure (mmHg): 112/57 Reference Range: 80 - 120 mg / dl Electronic Signature(s) Signed: 12/10/2022 1:34:23 PM By: [name] EMT Entered By: [name] on 12/10/2022 10:34:23

## 2022-12-10 NOTE — Progress Notes (Signed)
[name], [name] ([id number]) [id number] Page 1 of 2 Visit Report for 12/10/2022 HBO Details Patient Name: Date of Service: [name], [name] 12/10/2022 7:30 A M Medical Record Number: [id number] Patient Account Number: [id number] Date of Birth/Sex: Treating RN: [birth year] (82 y.o. [name] Primary Care Sahand Gosch: Charissa Bash Other Clinician: Karl Bales Referring Obaloluwa Delatte: Treating Emmamae Mcnamara/Extender: Morene Antu in Treatment: 4 HBO Treatment Course Details Treatment Course Number: 1 Ordering Rocklin Soderquist: Lenda Kelp T Treatments Ordered: otal 40 HBO Treatment Start Date: 11/11/2022 HBO Indication: Late Effect of Radiation HBO Treatment Details Treatment Number: 22 Patient Type: Outpatient Chamber Type: Monoplace Chamber Serial #: [id number] Treatment Protocol: 2.0 ATA with 90 minutes oxygen, and no air breaks Treatment Details Compression Rate Down: 1.5 psi / minute De-Compression Rate Up: 2.0 psi / minute Air breaks and breathing Decompress Decompress Compress Tx Pressure Begins Reached periods Begins Ends (leave unused spaces blank) Chamber Pressure (ATA 1 2 ------2 1 ) Clock Time (24 hr) 08:20 10:30 - - - - - - 10:00 10:08 Treatment Length: 108 (minutes) Treatment Segments: 4 Vital Signs Capillary Blood Glucose Reference Range: 80 - 120 mg / dl HBO Diabetic Blood Glucose Intervention Range: <131 mg/dl or >563 mg/dl Time Vitals Blood Respiratory Capillary Blood Glucose Pulse Action Type: Pulse: Temperature: Taken: Pressure: Rate: Glucose (mg/dl): Meter #: Oximetry (%) Taken: Pre 07:53 112/57 55 18 97.4 Post 10:10 154/66 51 18 97.7 Treatment Response Treatment Toleration: Well Treatment Completion Status: Treatment Completed without Adverse Event Joie Reamer Notes No concerns with treatment given Physician HBO Attestation: I certify that I supervised this HBO treatment in accordance with  Medicare guidelines. A trained emergency response team is readily available per Yes hospital policies and procedures. Continue HBOT as ordered. Yes Electronic Signature(s) Signed: 12/11/2022 10:44:47 AM By: Baltazar Najjar MD Previous Signature: 12/10/2022 1:44:50 PM Version By: Karl Bales EMT Entered By: Baltazar Najjar on 12/10/2022 12:53:18 [name] ([id number]) [id number] Page 2 of 2 -------------------------------------------------------------------------------- HBO Safety Checklist Details Patient Name: Date of Service: [name], [name] 12/10/2022 7:30 A M Medical Record Number: [id number] Patient Account Number: [id number] Date of Birth/Sex: Treating RN: [birth year] (82 y.o. [name] Primary Care Janace Decker: Charissa Bash Other Clinician: Karl Bales Referring Kanoe Wanner: Treating Nour Rodrigues/Extender: Morene Antu in Treatment: 4 HBO Safety Checklist Items Safety Checklist Consent Form Signed Patient voided / foley secured and emptied When did you last eato 0650 Last dose of injectable or oral agent NA Ostomy pouch emptied and vented if applicable NA All implantable devices assessed, documented and approved NA Intravenous access site secured and place NA Valuables secured Linens and cotton and cotton/polyester blend (less than 51% polyester) Personal oil-based products / skin lotions / body lotions removed Wigs or hairpieces removed NA Smoking or tobacco materials removed Books / newspapers / magazines / loose paper removed Cologne, aftershave, perfume and deodorant removed Jewelry removed (may wrap wedding band) Make-up removed NA Hair care products removed Battery operated devices (external) removed Heating patches and chemical warmers removed Titanium eyewear removed NA Nail polish cured greater than 10 hours NA Casting material cured greater than 10 hours NA Hearing aids  removed NA Loose dentures or partials removed removed by patient Prosthetics have been removed NA Patient demonstrates correct use of air break device (if applicable) Patient concerns have been addressed Patient grounding bracelet on and cord attached to chamber Specifics for Inpatients (complete in addition to above) Medication sheet sent with patient NA Intravenous medications needed or  due during therapy sent with patient NA Drainage tubes (e.g. nasogastric tube or chest tube secured and vented) NA Endotracheal or Tracheotomy tube secured NA Cuff deflated of air and inflated with saline NA Airway suctioned NA Notes The safety checklist was done before the treatment was started. Electronic Signature(s) Signed: 12/10/2022 1:40:03 PM By: Karl Bales EMT Entered By: Karl Bales on 12/10/2022 10:40:03

## 2022-12-11 ENCOUNTER — Encounter (HOSPITAL_BASED_OUTPATIENT_CLINIC_OR_DEPARTMENT_OTHER): Payer: Medicare HMO | Admitting: Internal Medicine

## 2022-12-11 DIAGNOSIS — I10 Essential (primary) hypertension: Secondary | ICD-10-CM | POA: Diagnosis not present

## 2022-12-11 DIAGNOSIS — L598 Other specified disorders of the skin and subcutaneous tissue related to radiation: Secondary | ICD-10-CM | POA: Diagnosis not present

## 2022-12-11 DIAGNOSIS — Z923 Personal history of irradiation: Secondary | ICD-10-CM | POA: Diagnosis not present

## 2022-12-11 DIAGNOSIS — R351 Nocturia: Secondary | ICD-10-CM | POA: Diagnosis not present

## 2022-12-11 DIAGNOSIS — Z8546 Personal history of malignant neoplasm of prostate: Secondary | ICD-10-CM | POA: Diagnosis not present

## 2022-12-11 DIAGNOSIS — N3041 Irradiation cystitis with hematuria: Secondary | ICD-10-CM | POA: Diagnosis not present

## 2022-12-11 NOTE — Progress Notes (Addendum)
JAYDYN, SCHWARTZENBERGE ([id number]) [id number] Page 1 of 2 Visit Report for 12/11/2022 Arrival Information Details Patient Name: Date of Service: ALARIK, HYPOLITE 12/11/2022 7:30 A M Medical Record Number: [id number] Patient Account Number: [id number] Date of Birth/Sex: Treating RN: [birth year] (82 y.o. [name], Millard.Loa Primary Care Christin Moline: Charissa Bash Other Clinician: Haywood Pao Referring Dallys Nowakowski: Treating Keiry Kowal/Extender: Morene Antu in Treatment: 5 Visit Information History Since Last Visit All ordered tests and consults were completed: Yes Patient Arrived: Ambulatory Added or deleted any medications: No Arrival Time: 07:38 Any new allergies or adverse reactions: No Accompanied By: self Had a fall or experienced change in No Transfer Assistance: None activities of daily living that may affect Patient Identification Verified: Yes risk of falls: Secondary Verification Process Completed: Yes Signs or symptoms of abuse/neglect since last visito No Patient Requires Transmission-Based Precautions: No Hospitalized since last visit: No Patient Has Alerts: No Implantable device outside of the clinic excluding No cellular tissue based products placed in the center since last visit: Pain Present Now: No Electronic Signature(s) Signed: 12/11/2022 9:49:24 AM By: Haywood Pao CHT EMT BS , , Entered By: Haywood Pao on 12/11/2022 06:49:24 -------------------------------------------------------------------------------- Encounter Discharge Information Details Patient Name: Date of Service: [name] 12/11/2022 7:30 A M Medical Record Number: [id number] Patient Account Number: [id number] Date of Birth/Sex: Treating RN: 07-07-1940 (82 y.o. [name] Primary Care Shyrl Obi: Charissa Bash Other Clinician: Haywood Pao Referring Askia Hazelip: Treating Kairav Russomanno/Extender: Morene Antu in Treatment: 5 Encounter Discharge Information Items Discharge Condition: Stable Ambulatory Status: Ambulatory Discharge Destination: Home Transportation: Private Auto Accompanied By: self Schedule Follow-up Appointment: No Clinical Summary of Care: Electronic Signature(s) Signed: 12/11/2022 10:55:42 AM By: Haywood Pao CHT EMT BS , , Entered By: Haywood Pao on 12/11/2022 07:55:42 [name] ([id number]) [id number] Page 2 of 2 -------------------------------------------------------------------------------- Vitals Details Patient Name: Date of Service: TYHIR, RODDA 12/11/2022 7:30 A M Medical Record Number: [id number] Patient Account Number: [id number] Date of Birth/Sex: Treating RN: 02-12-1940 (82 y.o. [name], Millard.Loa Primary Care Annaleise Burger: Charissa Bash Other Clinician: Haywood Pao Referring Mazel Villela: Treating Isidoro Santillana/Extender: Morene Antu in Treatment: 5 Vital Signs Time Taken: 07:43 Temperature (F): 97.9 Height (in): 71 Pulse (bpm): 53 Weight (lbs): 180 Respiratory Rate (breaths/min): 18 Body Mass Index (BMI): 25.1 Blood Pressure (mmHg): 123/63 Reference Range: 80 - 120 mg / dl Electronic Signature(s) Signed: 12/11/2022 9:49:52 AM By: Haywood Pao CHT EMT BS , , Entered By: Haywood Pao on 12/11/2022 06:49:52

## 2022-12-11 NOTE — Progress Notes (Signed)
[name] ([id number]) [id number] Page 1 of 2 Visit Report for 12/11/2022 HBO Details Patient Name: Date of Service: [name], [name] 12/11/2022 7:30 A M Medical Record Number: [id number] Patient Account Number: [id number] Date of Birth/Sex: Treating RN: 04/11/1940 (82 y.o. [name], Millard.Loa Primary Care [name]: Charissa Bash Other Clinician: [name] Referring [name]: Treating [name]/Extender: [name] in Treatment: 5 HBO Treatment Course Details Treatment Course Number: 1 Ordering Lisabeth Mian: Lenda Kelp T Treatments Ordered: otal 40 HBO Treatment Start Date: 11/11/2022 HBO Indication: Late Effect of Radiation HBO Treatment Details Treatment Number: 23 Patient Type: Outpatient Chamber Type: Monoplace Chamber Serial #: [id number] Treatment Protocol: 2.0 ATA with 90 minutes oxygen, and no air breaks Treatment Details Compression Rate Down: 1.5 psi / minute De-Compression Rate Up: 2.0 psi / minute Air breaks and breathing Decompress Decompress Compress Tx Pressure Begins Reached periods Begins Ends (leave unused spaces blank) Chamber Pressure (ATA 1 2 ------2 1 ) Clock Time (24 hr) 08:02 08:17 - - - - - - 09:47 09:57 Treatment Length: 115 (minutes) Treatment Segments: 4 Vital Signs Capillary Blood Glucose Reference Range: 80 - 120 mg / dl HBO Diabetic Blood Glucose Intervention Range: <131 mg/dl or >253 mg/dl Type: Time Vitals Blood Respiratory Capillary Blood Glucose Pulse Action Pulse: Temperature: Taken: Pressure: Rate: Glucose (mg/dl): Meter #: Oximetry (%) Taken: Pre 07:43 123/63 53 18 97.9 none per protocol Post 10:04 142/73 49 18 97.3 none per protocol Treatment Response Treatment Toleration: Well Treatment Completion Status: Treatment Completed without Adverse Event Treatment Notes [name] arrived with normal vital signs. He prepared for treatment. After performing a  safety check, patient was placed in the chamber which was compressed with 100% oxygen at a rate of 2 psi/min after confirming normal ear equalization. He tolerated the treatment and subsequent decompression at the rate of 2 psi/min. He denied any issues with ear equalization and/or pain related to barotrauma. His post-treatment vital signs were within normal range. He was stable upon discharge. [name] Notes No concerns with treatment given Physician HBO Attestation: I certify that I supervised this HBO treatment in accordance with Medicare guidelines. A trained emergency response team is readily available per Yes hospital policies and procedures. Continue HBOT as ordered. Yes Electronic Signature(s) Signed: 12/12/2022 4:06:32 PM By: [name] Najjar MD Crosland, LACIE11/14/2024 4:06:32 PM By: [name] Najjar MD SignedKathie [name] ([id number]) [id number] Page 2 of 2 Previous Signature: 12/11/2022 10:54:16 AM Version By: [name] CHT EMT BS , , Previous Signature: 12/11/2022 9:54:31 AM Version By: [name] CHT EMT BS , , Entered By: [name] on 12/11/2022 12:32:49 -------------------------------------------------------------------------------- HBO Safety Checklist Details Patient Name: Date of Service: [name]. 12/11/2022 7:30 A M Medical Record Number: [id number] Patient Account Number: [id number] Date of Birth/Sex: Treating RN: March 06, 1940 (82 y.o. [name], Millard.Loa Primary Care [name]: Charissa Bash Other Clinician: [name] Referring [name]: Treating [name]: [name] in Treatment: 5 HBO Safety Checklist Items Safety Checklist Consent Form Signed Patient voided / foley secured and emptied When did you last eato Breakfast Last dose of injectable or oral agent n/a Ostomy pouch emptied and vented if applicable NA All implantable devices assessed, documented and  approved NA Intravenous access site secured and place NA Valuables secured Linens and cotton and cotton/polyester blend (less than 51% polyester) Personal oil-based products / skin lotions / body lotions removed Wigs or hairpieces removed NA Smoking or tobacco materials removed NA Books / newspapers / magazines / loose  paper removed Cologne, aftershave, perfume and deodorant removed Jewelry removed (may wrap wedding band) Make-up removed NA Hair care products removed Battery operated devices (external) removed Heating patches and chemical warmers removed Titanium eyewear removed Nail polish cured greater than 10 hours NA Casting material cured greater than 10 hours NA Hearing aids removed Loose dentures or partials removed Prosthetics have been removed NA Patient demonstrates correct use of air break device (if applicable) Patient concerns have been addressed Patient grounding bracelet on and cord attached to chamber Specifics for Inpatients (complete in addition to above) Medication sheet sent with patient NA Intravenous medications needed or due during therapy sent with patient NA Drainage tubes (e.g. nasogastric tube or chest tube secured and vented) NA Endotracheal or Tracheotomy tube secured NA Cuff deflated of air and inflated with saline NA Airway suctioned NA Notes Paper version used prior to treatment start. Electronic Signature(s) Signed: 12/11/2022 9:53:12 AM By: [name] CHT EMT BS , , Entered By: [name] on 12/11/2022 06:53:12

## 2022-12-11 NOTE — Progress Notes (Signed)
CORT, KOIS ([id number]) [id number] Page 1 of 2 Visit Report for 12/10/2022 Problem List Details Patient Name: Date of Service: [name], [name] 12/10/2022 7:30 A M Medical Record Number: [id number] Patient Account Number: [id number] Date of Birth/Sex: Treating RN: 28-Jan-1941 (82 y.o. [name] Primary Care Provider: Charissa Bash Other Clinician: Karl Bales Referring Provider: Treating Provider/Extender: Morene Antu in Treatment: 4 Active Problems ICD-10 Encounter Code Description Active Date MDM Diagnosis N30.41 Irradiation cystitis with hematuria 11/06/2022 No Yes R35.1 Nocturia 11/06/2022 No Yes I10 Essential (primary) hypertension 11/06/2022 No Yes Inactive Problems Resolved Problems Electronic Signature(s) Signed: 12/10/2022 1:45:49 PM By: Karl Bales EMT Signed: 12/11/2022 10:44:47 AM By: Baltazar Najjar MD Entered By: Karl Bales on 12/10/2022 10:45:49 -------------------------------------------------------------------------------- SuperBill Details Patient Name: Date of Service: [name] Riffle. 12/10/2022 Medical Record Number: [id number] Patient Account Number: [id number] Date of Birth/Sex: Treating RN: 1940/04/16 (82 y.o. [name] Primary Care Provider: Charissa Bash Other Clinician: Karl Bales Referring Provider: Treating Provider/Extender: Morene Antu in Treatment: 4 Diagnosis Coding ICD-10 Codes Code Description N30.41 Irradiation cystitis with hematuria R35.1 Nocturia I10 Essential (primary) hypertension Facility Procedures ALDIS, LAMORTE ([id number]): CPT4 Code Description Modifier Quantity [id number] G0277-(Facility Use Only) HBOT full body chamber, , 4 ICD-10 Diagnosis Description N30.41 Irradiation cystitis with hematuria R35.1 Nocturia I10 Essential (primary)  hypertension [id number] Page 2  of 2: Physician Procedures : CPT4 Code Description Modifier [id number] [phone] - WC PHYS HYPERBARIC OXYGEN THERAPY ICD-10 Diagnosis Description N30.41 Irradiation cystitis with hematuria R35.1 Nocturia I10 Essential (primary) hypertension Quantity: 1 Electronic Signature(s) Signed: 12/10/2022 1:45:38 PM By: Karl Bales EMT Signed: 12/11/2022 10:44:47 AM By: Baltazar Najjar MD Entered By: Karl Bales on 12/10/2022 10:45:38

## 2022-12-12 ENCOUNTER — Encounter (HOSPITAL_BASED_OUTPATIENT_CLINIC_OR_DEPARTMENT_OTHER): Payer: Medicare HMO | Admitting: Internal Medicine

## 2022-12-12 DIAGNOSIS — C61 Malignant neoplasm of prostate: Secondary | ICD-10-CM | POA: Diagnosis not present

## 2022-12-12 DIAGNOSIS — R351 Nocturia: Secondary | ICD-10-CM | POA: Diagnosis not present

## 2022-12-12 DIAGNOSIS — Z8546 Personal history of malignant neoplasm of prostate: Secondary | ICD-10-CM | POA: Diagnosis not present

## 2022-12-12 DIAGNOSIS — N3041 Irradiation cystitis with hematuria: Secondary | ICD-10-CM | POA: Diagnosis not present

## 2022-12-12 DIAGNOSIS — Z923 Personal history of irradiation: Secondary | ICD-10-CM | POA: Diagnosis not present

## 2022-12-12 DIAGNOSIS — L598 Other specified disorders of the skin and subcutaneous tissue related to radiation: Secondary | ICD-10-CM | POA: Diagnosis not present

## 2022-12-12 DIAGNOSIS — I10 Essential (primary) hypertension: Secondary | ICD-10-CM | POA: Diagnosis not present

## 2022-12-12 NOTE — Progress Notes (Signed)
LOUDEN, POULIOT ([id number]) [id number] Page 1 of 1 Visit Report for 12/11/2022 SuperBill Details Patient Name: Date of Service: [name], [name] 12/11/2022 Medical Record Number: [id number] Patient Account Number: [id number] Date of Birth/Sex: Treating RN: 30-Dec-1940 (82 y.o. [name] Primary Care Provider: Charissa Bash Other Clinician: Haywood Pao Referring Provider: Treating Provider/Extender: Morene Antu in Treatment: 5 Diagnosis Coding ICD-10 Codes Code Description N30.41 Irradiation cystitis with hematuria R35.1 Nocturia I10 Essential (primary) hypertension Facility Procedures CPT4 Code Description Modifier Quantity [id number] G0277-(Facility Use Only) HBOT full body chamber, , 4 ICD-10 Diagnosis Description N30.41 Irradiation cystitis with hematuria R35.1 Nocturia I10 Essential (primary) hypertension Physician Procedures Quantity CPT4 Code Description Modifier [id number] - WC PHYS HYPERBARIC OXYGEN THERAPY 1 ICD-10 Diagnosis Description N30.41 Irradiation cystitis with hematuria R35.1 Nocturia I10 Essential (primary) hypertension Electronic Signature(s) Signed: 12/11/2022 10:55:16 AM By: Haywood Pao CHT EMT BS , , Signed: 12/12/2022 4:06:32 PM By: Baltazar Najjar MD Entered By: Haywood Pao on 12/11/2022 07:55:16

## 2022-12-12 NOTE — Progress Notes (Signed)
[name], [name] ([id number]) [id number] Page 1 of 2 Visit Report for 12/12/2022 Problem List Details Patient Name: Date of Service: [name], [name] 12/12/2022 10:00 A M Medical Record Number: [id number] Patient Account Number: [id number] Date of Birth/Sex: Treating RN: 01-28-1941 (82 y.o. [name] Primary Care Provider: Charissa Bash Other Clinician: Karl Bales Referring Provider: Treating Provider/Extender: Morene Antu in Treatment: 5 Active Problems ICD-10 Encounter Code Description Active Date MDM Diagnosis N30.41 Irradiation cystitis with hematuria 11/06/2022 No Yes R35.1 Nocturia 11/06/2022 No Yes I10 Essential (primary) hypertension 11/06/2022 No Yes Inactive Problems Resolved Problems Electronic Signature(s) Signed: 12/12/2022 2:45:41 PM By: Karl Bales EMT Signed: 12/12/2022 4:06:32 PM By: Baltazar Najjar MD Entered By: Karl Bales on 12/12/2022 14:45:41 -------------------------------------------------------------------------------- SuperBill Details Patient Name: Date of Service: [name] Riffle. 12/12/2022 Medical Record Number: [id number] Patient Account Number: [id number] Date of Birth/Sex: Treating RN: [birth year] (82 y.o. [name] Primary Care Provider: Charissa Bash Other Clinician: Karl Bales Referring Provider: Treating Provider/Extender: Morene Antu in Treatment: 5 Diagnosis Coding ICD-10 Codes Code Description N30.41 Irradiation cystitis with hematuria R35.1 Nocturia I10 Essential (primary) hypertension Facility Procedures [name], [name] ([id number]): CPT4 Code Description Modifier Quantity [id number] G0277-(Facility Use Only) HBOT full body chamber, , 4 ICD-10 Diagnosis Description N30.41 Irradiation cystitis with hematuria R35.1 Nocturia I10 Essential (primary)  hypertension [id number] Page 2 of  2: Physician Procedures : CPT4 Code Description Modifier [id number] [phone] - WC PHYS HYPERBARIC OXYGEN THERAPY ICD-10 Diagnosis Description N30.41 Irradiation cystitis with hematuria R35.1 Nocturia I10 Essential (primary) hypertension Quantity: 1 Electronic Signature(s) Signed: 12/12/2022 2:45:36 PM By: Karl Bales EMT Signed: 12/12/2022 4:06:32 PM By: Baltazar Najjar MD Entered By: Karl Bales on 12/12/2022 14:45:35

## 2022-12-12 NOTE — Progress Notes (Signed)
HAM, CONK ([id number]) [id number] Page 1 of 2 Visit Report for 12/12/2022 Arrival Information Details Patient Name: Date of Service: SHAEDON, ENGLERT 12/12/2022 10:00 A M Medical Record Number: [id number] Patient Account Number: [id number] Date of Birth/Sex: Treating RN: 1940/04/13 (82 y.o. [name] Primary Care Eli [name]: Charissa Bash Other Clinician: Karl Bales Referring Reyaan Thoma: Treating Mychaela Lennartz/Extender: Morene Antu in Treatment: 5 Visit Information History Since Last Visit All ordered tests and consults were completed: Yes Patient Arrived: Ambulatory Added or deleted any medications: No Arrival Time: 09:57 Any new allergies or adverse reactions: No Accompanied By: None Had a fall or experienced change in No Transfer Assistance: None activities of daily living that may affect Patient Requires Transmission-Based Precautions: No risk of falls: Patient Has Alerts: No Signs or symptoms of abuse/neglect since last visito No Hospitalized since last visit: No Implantable device outside of the clinic excluding No cellular tissue based products placed in the center since last visit: Pain Present Now: No Electronic Signature(s) Signed: 12/12/2022 2:41:48 PM By: Karl Bales EMT Entered By: Karl Bales on 12/12/2022 11:41:48 -------------------------------------------------------------------------------- Encounter Discharge Information Details Patient Name: Date of Service: [name] 12/12/2022 10:00 A M Medical Record Number: [id number] Patient Account Number: [id number] Date of Birth/Sex: Treating RN: April 16, 1940 (82 y.o. [name] Primary Care Rosland Riding: Charissa Bash Other Clinician: Karl Bales Referring Geisha Abernathy: Treating Ilianna Bown/Extender: Morene Antu in Treatment: 5 Encounter Discharge Information Items Discharge Condition:  Stable Ambulatory Status: Ambulatory Discharge Destination: Home Transportation: Private Auto Accompanied By: None Schedule Follow-up Appointment: Yes Clinical Summary of Care: Electronic Signature(s) Signed: 12/12/2022 2:46:07 PM By: Karl Bales EMT Entered By: Karl Bales on 12/12/2022 11:46:07 [name] ([id number]) [id number] Page 2 of 2 -------------------------------------------------------------------------------- Vitals Details Patient Name: Date of Service: TENOR, CASA 12/12/2022 10:00 A M Medical Record Number: [id number] Patient Account Number: [id number] Date of Birth/Sex: Treating RN: 12-22-1940 (82 y.o. [name] Primary Care Nakeshia Waldeck: Charissa Bash Other Clinician: Karl Bales Referring Edwardine Deschepper: Treating Ahnyla Mendel/Extender: Morene Antu in Treatment: 5 Vital Signs Time Taken: 10:00 Temperature (F): 97.0 Height (in): 71 Pulse (bpm): 56 Weight (lbs): 180 Respiratory Rate (breaths/min): 18 Body Mass Index (BMI): 25.1 Blood Pressure (mmHg): 130/68 Reference Range: 80 - 120 mg / dl Electronic Signature(s) Signed: 12/12/2022 2:42:15 PM By: Karl Bales EMT Entered By: Karl Bales on 12/12/2022 11:42:15

## 2022-12-12 NOTE — Progress Notes (Addendum)
CARLITO, SENSAT ([id number]) [id number] Page 1 of 2 Visit Report for 12/12/2022 HBO Details Patient Name: Date of Service: [name], [name] 12/12/2022 10:00 A M Medical Record Number: [id number] Patient Account Number: [id number] Date of Birth/Sex: Treating RN: 20-Jun-1940 (82 y.o. [name] Primary Care Charisma Charlot: Charissa Bash Other Clinician: Karl Bales Referring Jarika Robben: Treating Pheonix Clinkscale/Extender: Morene Antu in Treatment: 5 HBO Treatment Course Details Treatment Course Number: 1 Ordering Sion Reinders: Lenda Kelp T Treatments Ordered: otal 40 HBO Treatment Start Date: 11/11/2022 HBO Indication: Late Effect of Radiation HBO Treatment Details Treatment Number: 24 Patient Type: Outpatient Chamber Type: Monoplace Chamber Serial #: [id number] Treatment Protocol: 2.0 ATA with 90 minutes oxygen, and no air breaks Treatment Details Compression Rate Down: 2.0 psi / minute De-Compression Rate Up: 2.0 psi / minute Air breaks and breathing Decompress Decompress Compress Tx Pressure Begins Reached periods Begins Ends (leave unused spaces blank) Chamber Pressure (ATA 1 2 ------2 1 ) Clock Time (24 hr) 10:43 10:52 - - - - - - 12:32 12:40 Treatment Length: 117 (minutes) Treatment Segments: 4 Vital Signs Capillary Blood Glucose Reference Range: 80 - 120 mg / dl HBO Diabetic Blood Glucose Intervention Range: <131 mg/dl or >308 mg/dl Time Vitals Blood Respiratory Capillary Blood Glucose Pulse Action Type: Pulse: Temperature: Taken: Pressure: Rate: Glucose (mg/dl): Meter #: Oximetry (%) Taken: Pre 10:00 130/68 56 18 97 Post 12:42 152/75 49 16 97.3 Treatment Response Treatment Toleration: Well Treatment Completion Status: Treatment Completed without Adverse Event Marissah Vandemark Notes No concern with treatment given Physician HBO Attestation: I certify that I supervised this HBO treatment in accordance with  Medicare guidelines. A trained emergency response team is readily available per Yes hospital policies and procedures. Continue HBOT as ordered. Yes Electronic Signature(s) Signed: 12/12/2022 4:06:32 PM By: Baltazar Najjar MD Previous Signature: 12/12/2022 2:45:18 PM Version By: Karl Bales EMT Entered By: Baltazar Najjar on 12/12/2022 15:34:03 [name] ([id number]) [id number] Page 2 of 2 -------------------------------------------------------------------------------- HBO Safety Checklist Details Patient Name: Date of Service: [name], [name] 12/12/2022 10:00 A M Medical Record Number: [id number] Patient Account Number: [id number] Date of Birth/Sex: Treating RN: 1940/06/19 (82 y.o. [name] Primary Care Aiman Noe: Charissa Bash Other Clinician: Karl Bales Referring Vada Yellen: Treating Ardian Haberland/Extender: Morene Antu in Treatment: 5 HBO Safety Checklist Items Safety Checklist Consent Form Signed Patient voided / foley secured and emptied When did you last eato 0800 Last dose of injectable or oral agent NA Ostomy pouch emptied and vented if applicable NA All implantable devices assessed, documented and approved NA Intravenous access site secured and place NA Valuables secured Linens and cotton and cotton/polyester blend (less than 51% polyester) Personal oil-based products / skin lotions / body lotions removed Wigs or hairpieces removed NA Smoking or tobacco materials removed Books / newspapers / magazines / loose paper removed Cologne, aftershave, perfume and deodorant removed Jewelry removed (may wrap wedding band) Make-up removed NA Hair care products removed Battery operated devices (external) removed Heating patches and chemical warmers removed Titanium eyewear removed NA Nail polish cured greater than 10 hours NA Casting material cured greater than 10 hours NA Hearing aids  removed NA Loose dentures or partials removed Prosthetics have been removed NA Patient demonstrates correct use of air break device (if applicable) Patient concerns have been addressed Patient grounding bracelet on and cord attached to chamber Specifics for Inpatients (complete in addition to above) Medication sheet sent with patient NA Intravenous medications needed or due during therapy  sent with patient NA Drainage tubes (e.g. nasogastric tube or chest tube secured and vented) NA Endotracheal or Tracheotomy tube secured NA Cuff deflated of air and inflated with saline NA Airway suctioned NA Notes The safety checklist was done before the treatment was started. Electronic Signature(s) Signed: 12/12/2022 2:44:03 PM By: Karl Bales EMT Entered By: Karl Bales on 12/12/2022 14:44:03

## 2022-12-13 ENCOUNTER — Encounter (HOSPITAL_BASED_OUTPATIENT_CLINIC_OR_DEPARTMENT_OTHER): Payer: Medicare HMO | Admitting: General Surgery

## 2022-12-16 ENCOUNTER — Encounter (HOSPITAL_BASED_OUTPATIENT_CLINIC_OR_DEPARTMENT_OTHER): Payer: Medicare HMO | Admitting: Internal Medicine

## 2022-12-16 DIAGNOSIS — R351 Nocturia: Secondary | ICD-10-CM | POA: Diagnosis not present

## 2022-12-16 DIAGNOSIS — L598 Other specified disorders of the skin and subcutaneous tissue related to radiation: Secondary | ICD-10-CM | POA: Diagnosis not present

## 2022-12-16 DIAGNOSIS — N3041 Irradiation cystitis with hematuria: Secondary | ICD-10-CM | POA: Diagnosis not present

## 2022-12-16 DIAGNOSIS — Z923 Personal history of irradiation: Secondary | ICD-10-CM | POA: Diagnosis not present

## 2022-12-16 DIAGNOSIS — I10 Essential (primary) hypertension: Secondary | ICD-10-CM | POA: Diagnosis not present

## 2022-12-16 DIAGNOSIS — Z8546 Personal history of malignant neoplasm of prostate: Secondary | ICD-10-CM | POA: Diagnosis not present

## 2022-12-16 NOTE — Progress Notes (Signed)
ISABEL, MCGRUE ([id number]) [id number] Page 1 of 2 Visit Report for 12/16/2022 Arrival Information Details Patient Name: Date of Service: KIYOTO, BLANKLEY 12/16/2022 7:30 A M Medical Record Number: [id number] Patient Account Number: [id number] Date of Birth/Sex: Treating RN: [birth year] (82 y.o. [name] Primary Care Neidy Guerrieri: Charissa Bash Other Clinician: Haywood Pao Referring Charlen Bakula: Treating Erinn Mendosa/Extender: Morene Antu in Treatment: 5 Visit Information History Since Last Visit All ordered tests and consults were completed: Yes Patient Arrived: Ambulatory Added or deleted any medications: No Arrival Time: 07:40 Any new allergies or adverse reactions: No Accompanied By: self Had a fall or experienced change in No Transfer Assistance: None activities of daily living that may affect Patient Identification Verified: Yes risk of falls: Secondary Verification Process Completed: Yes Signs or symptoms of abuse/neglect since last visito No Patient Requires Transmission-Based Precautions: No Hospitalized since last visit: No Patient Has Alerts: No Implantable device outside of the clinic excluding No cellular tissue based products placed in the center since last visit: Pain Present Now: No Electronic Signature(s) Signed: 12/16/2022 1:14:01 PM By: Haywood Pao CHT EMT BS , , Entered By: Haywood Pao on 12/16/2022 10:14:01 -------------------------------------------------------------------------------- Encounter Discharge Information Details Patient Name: Date of Service: [name] 12/16/2022 7:30 A M Medical Record Number: [id number] Patient Account Number: [id number] Date of Birth/Sex: Treating RN: November 18, 1940 (82 y.o. [name] Primary Care Kazuto Sevey: Charissa Bash Other Clinician: Haywood Pao Referring Seichi Kaufhold: Treating Geana Walts/Extender: Morene Antu in Treatment: 5 Encounter Discharge Information Items Discharge Condition: Stable Ambulatory Status: Ambulatory Discharge Destination: Home Transportation: Private Auto Accompanied By: self Schedule Follow-up Appointment: No Clinical Summary of Care: Electronic Signature(s) Signed: 12/16/2022 1:28:43 PM By: Haywood Pao CHT EMT BS , , Entered By: Haywood Pao on 12/16/2022 10:28:43 [name] ([id number]) [id number] Page 2 of 2 -------------------------------------------------------------------------------- Vitals Details Patient Name: Date of Service: ROXY, KEMME 12/16/2022 7:30 A M Medical Record Number: [id number] Patient Account Number: [id number] Date of Birth/Sex: Treating RN: [birth year] (82 y.o. [name] Primary Care Jawad Wiacek: Charissa Bash Other Clinician: Haywood Pao Referring Orlinda Slomski: Treating Jayceion Lisenby/Extender: Morene Antu in Treatment: 5 Vital Signs Time Taken: 08:00 Temperature (F): 97.3 Height (in): 71 Pulse (bpm): 57 Weight (lbs): 180 Respiratory Rate (breaths/min): 18 Body Mass Index (BMI): 25.1 Blood Pressure (mmHg): 131/67 Reference Range: 80 - 120 mg / dl Electronic Signature(s) Signed: 12/16/2022 1:14:53 PM By: Haywood Pao CHT EMT BS , , Entered By: Haywood Pao on 12/16/2022 10:14:53

## 2022-12-16 NOTE — Progress Notes (Signed)
[name], [name] ([id number]) [id number] Page 1 of 1 Visit Report for 12/16/2022 SuperBill Details Patient Name: Date of Service: [name], [name] 12/16/2022 Medical Record Number: [id number] Patient Account Number: [id number] Date of Birth/Sex: Treating RN: Mar 07, 1940 (82 y.o. [name] Primary Care Provider: Charissa Bash Other Clinician: Haywood Pao Referring Provider: Treating Provider/Extender: Morene Antu in Treatment: 5 Diagnosis Coding ICD-10 Codes Code Description N30.41 Irradiation cystitis with hematuria R35.1 Nocturia I10 Essential (primary) hypertension Facility Procedures CPT4 Code Description Modifier Quantity [id number] G0277-(Facility Use Only) HBOT full body chamber, , 4 ICD-10 Diagnosis Description N30.41 Irradiation cystitis with hematuria R35.1 Nocturia I10 Essential (primary) hypertension Physician Procedures Quantity CPT4 Code Description Modifier [id number] 99183 - WC PHYS HYPERBARIC OXYGEN THERAPY 1 ICD-10 Diagnosis Description N30.41 Irradiation cystitis with hematuria R35.1 Nocturia I10 Essential (primary) hypertension Electronic Signature(s) Signed: 12/16/2022 1:26:27 PM By: Haywood Pao CHT EMT BS , , Signed: 12/16/2022 4:31:26 PM By: Baltazar Najjar MD Entered By: Haywood Pao on 12/16/2022 10:26:27

## 2022-12-16 NOTE — Progress Notes (Addendum)
[name], [name] ([id number]) [id number] Page 1 of 2 Visit Report for 12/16/2022 HBO Details Patient Name: Date of Service: [name], [name] 12/16/2022 7:30 A M Medical Record Number: [id number] Patient Account Number: [id number] Date of Birth/Sex: Treating RN: [birth year] (82 y.o. [name] Primary Care Merion Caton: Charissa Bash Other Clinician: Haywood Pao Referring Jasleen Riepe: Treating Demian Maisel/Extender: Morene Antu in Treatment: 5 HBO Treatment Course Details Treatment Course Number: 1 Ordering Ladeana Laplant: Lenda Kelp T Treatments Ordered: otal 40 HBO Treatment Start Date: 11/11/2022 HBO Indication: Late Effect of Radiation HBO Treatment Details Treatment Number: 25 Patient Type: Outpatient Chamber Type: Monoplace Chamber Serial #: [id number] Treatment Protocol: 2.0 ATA with 90 minutes oxygen, and no air breaks Treatment Details Compression Rate Down: 1.5 psi / minute De-Compression Rate Up: Air breaks and breathing Decompress Decompress Compress Tx Pressure Begins Reached periods Begins Ends (leave unused spaces blank) Chamber Pressure (ATA 1 2 ------2 1 ) Clock Time (24 hr) 08:07 08:19 - - - - - - 09:49 10:00 Treatment Length: 113 (minutes) Treatment Segments: 4 Vital Signs Capillary Blood Glucose Reference Range: 80 - 120 mg / dl HBO Diabetic Blood Glucose Intervention Range: <131 mg/dl or >017 mg/dl Type: Time Vitals Blood Pulse: Respiratory Temperature: Capillary Blood Glucose Pulse Action Taken: Pressure: Rate: Glucose (mg/dl): Meter #: Oximetry (%) Taken: Pre 08:00 131/67 57 18 97.3 asymptomatic for bradycardia Post 10:04 128/76 54 18 97.2 asymptomatic for bradycardia Treatment Response Treatment Toleration: Well Treatment Completion Status: Treatment Completed without Adverse Event Treatment Notes [name] arrived with normal vitals except heart rated of 57 bpm. He denied symptoms  of bradycardia. After performing a safety check, he was placed in the chamber which was compressed at the rate of 2 psi/min after confirming normal ear equalization. He tolerated the treatment and subsequent decompression of the chamber at the rate of 2 psi/min. Post-treatment heart rate was 54 bpm. He denied symptoms of bradycardia and was visibly asymptomatic as well. He was stable upon discharge. Judye Lorino Notes No concern with treatment given Physician HBO Attestation: I certify that I supervised this HBO treatment in accordance with Medicare guidelines. A trained emergency response team is readily available per Yes hospital policies and procedures. Continue HBOT as ordered. Yes Electronic Signature(s) Signed: 12/16/2022 4:31:26 PM By: Baltazar Najjar MD Violette, LACIE11/18/2024 4:31:26 PM By: Baltazar Najjar MD SignedKathie Rhodes ([id number]) [id number] Page 2 of 2 Previous Signature: 12/16/2022 1:26:11 PM Version By: Haywood Pao CHT EMT BS , , Entered By: Baltazar Najjar on 12/16/2022 13:03:19 -------------------------------------------------------------------------------- HBO Safety Checklist Details Patient Name: Date of Service: [name], [name] 12/16/2022 7:30 A M Medical Record Number: [id number] Patient Account Number: [id number] Date of Birth/Sex: Treating RN: [birth year] (82 y.o. [name] Primary Care Coreyon Nicotra: Charissa Bash Other Clinician: Haywood Pao Referring Bernal Luhman: Treating Enrique Manganaro/Extender: Morene Antu in Treatment: 5 HBO Safety Checklist Items Safety Checklist Consent Form Signed Patient voided / foley secured and emptied When did you last eato 0600 Last dose of injectable or oral agent n/a Ostomy pouch emptied and vented if applicable NA All implantable devices assessed, documented and approved NA Intravenous access site secured and place NA Valuables secured Linens and cotton and  cotton/polyester blend (less than 51% polyester) Personal oil-based products / skin lotions / body lotions removed Wigs or hairpieces removed NA Smoking or tobacco materials removed NA Books / newspapers / magazines / loose paper removed Cologne, aftershave, perfume and deodorant removed Jewelry removed (may wrap wedding band) Make-up  removed NA Hair care products removed Battery operated devices (external) removed Heating patches and chemical warmers removed Titanium eyewear removed Nail polish cured greater than 10 hours NA Casting material cured greater than 10 hours NA Hearing aids removed Loose dentures or partials removed Prosthetics have been removed NA Patient demonstrates correct use of air break device (if applicable) Patient concerns have been addressed Patient grounding bracelet on and cord attached to chamber Specifics for Inpatients (complete in addition to above) Medication sheet sent with patient NA Intravenous medications needed or due during therapy sent with patient NA Drainage tubes (e.g. nasogastric tube or chest tube secured and vented) NA Endotracheal or Tracheotomy tube secured NA Cuff deflated of air and inflated with saline NA Airway suctioned NA Notes Paper version used prior to treatment start. Electronic Signature(s) Signed: 12/16/2022 1:17:45 PM By: Haywood Pao CHT EMT BS , , Entered By: Haywood Pao on 12/16/2022 10:17:44

## 2022-12-17 ENCOUNTER — Encounter (HOSPITAL_BASED_OUTPATIENT_CLINIC_OR_DEPARTMENT_OTHER): Payer: Medicare HMO | Admitting: Internal Medicine

## 2022-12-17 DIAGNOSIS — Z8546 Personal history of malignant neoplasm of prostate: Secondary | ICD-10-CM | POA: Diagnosis not present

## 2022-12-17 DIAGNOSIS — Z923 Personal history of irradiation: Secondary | ICD-10-CM | POA: Diagnosis not present

## 2022-12-17 DIAGNOSIS — N3041 Irradiation cystitis with hematuria: Secondary | ICD-10-CM | POA: Diagnosis not present

## 2022-12-17 DIAGNOSIS — R351 Nocturia: Secondary | ICD-10-CM | POA: Diagnosis not present

## 2022-12-17 DIAGNOSIS — L598 Other specified disorders of the skin and subcutaneous tissue related to radiation: Secondary | ICD-10-CM | POA: Diagnosis not present

## 2022-12-17 DIAGNOSIS — I10 Essential (primary) hypertension: Secondary | ICD-10-CM | POA: Diagnosis not present

## 2022-12-17 NOTE — Progress Notes (Signed)
[name], [name] ([id number]) [id number] Page 1 of 1 Visit Report for 12/17/2022 SuperBill Details Patient Name: Date of Service: [name], [name] 12/17/2022 Medical Record Number: [id number] Patient Account Number: [id number] Date of Birth/Sex: Treating RN: 02-06-1940 (82 y.o. [name] Primary Care Provider: Charissa Bash Other Clinician: Haywood Pao Referring Provider: Treating Provider/Extender: Morene Antu in Treatment: 5 Diagnosis Coding ICD-10 Codes Code Description N30.41 Irradiation cystitis with hematuria R35.1 Nocturia I10 Essential (primary) hypertension Facility Procedures CPT4 Code Description Modifier Quantity [id number]-(Facility Use Only) HBOT full body chamber, , 4 ICD-10 Diagnosis Description N30.41 Irradiation cystitis with hematuria R35.1 Nocturia I10 Essential (primary) hypertension Physician Procedures Quantity CPT4 Code Description Modifier [id number] - WC PHYS HYPERBARIC OXYGEN THERAPY 1 ICD-10 Diagnosis Description 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

## 2022-12-17 NOTE — Progress Notes (Signed)
AHMERE, JULIO ([id number]) [id number] Page 1 of 2 Visit Report for 12/17/2022 Arrival Information Details Patient Name: Date of Service: [name], [name] 12/17/2022 7:30 A M Medical Record Number: [id number] Patient Account Number: [id number] Date of Birth/Sex: Treating RN: [birth year] (82 y.o. [name], [name] Primary Care Jacquie Lukes: Charissa Bash Other Clinician: Karl Bales Referring Anani Gu: Treating Kawika Bischoff/Extender: Morene Antu in Treatment: 5 Visit Information History Since Last Visit All ordered tests and consults were completed: Yes Patient Arrived: Ambulatory Added or deleted any medications: No Arrival Time: 07:39 Any new allergies or adverse reactions: No Accompanied By: self Had a fall or experienced change in No Transfer Assistance: None activities of daily living that may affect Patient Identification Verified: Yes risk of falls: Secondary Verification Process Completed: Yes Signs or symptoms of abuse/neglect since last visito No Patient Requires Transmission-Based Precautions: No Hospitalized since last visit: No Patient Has Alerts: No Implantable device outside of the clinic excluding No cellular tissue based products placed in the center since last visit: Pain Present Now: No Electronic Signature(s) Signed: 12/17/2022 1:24:41 PM By: Haywood Pao CHT EMT BS , , Entered By: Haywood Pao on 12/17/2022 10:24:41 -------------------------------------------------------------------------------- Encounter Discharge Information Details Patient Name: Date of Service: [name] Riffle. 12/17/2022 7:30 A M Medical Record Number: [id number] Patient Account Number: [id number] Date of Birth/Sex: Treating RN: 1940/04/11 (82 y.o. [name] Primary Care Vale Peraza: Charissa Bash Other Clinician: Haywood Pao Referring Lorence Nagengast: Treating Casey Fye/Extender: Morene Antu in Treatment: 5 Encounter Discharge Information Items Discharge Condition: Stable Ambulatory Status: Ambulatory Discharge Destination: Home Transportation: Private Auto Accompanied By: self Schedule Follow-up Appointment: No Clinical Summary of Care: Electronic Signature(s) Signed: 12/17/2022 1:31:00 PM By: Haywood Pao CHT EMT BS , , Entered By: Haywood Pao on 12/17/2022 10:31:00 [name] ([id number]) [id number] Page 2 of 2 -------------------------------------------------------------------------------- Vitals Details Patient Name: Date of Service: [name], [name] 12/17/2022 7:30 A M Medical Record Number: [id number] Patient Account Number: [id number] Date of Birth/Sex: Treating RN: November 18, 1940 (82 y.o. [name] Primary Care Drucilla Cumber: Charissa Bash Other Clinician: Haywood Pao Referring Ona Rathert: Treating Makenize Messman/Extender: Morene Antu in Treatment: 5 Vital Signs Time Taken: 07:43 Temperature (F): 98.1 Height (in): 71 Pulse (bpm): 60 Weight (lbs): 180 Respiratory Rate (breaths/min): 18 Body Mass Index (BMI): 25.1 Blood Pressure (mmHg): 110/65 Reference Range: 80 - 120 mg / dl Electronic Signature(s) Signed: 12/17/2022 1:25:05 PM By: Haywood Pao CHT EMT BS , , Entered By: Haywood Pao on 12/17/2022 10:25:04

## 2022-12-17 NOTE — Progress Notes (Addendum)
MAHIR, ALIBERTI ([id number]) [id number] Page 1 of 2 Visit Report for 12/17/2022 HBO Details Patient Name: Date of Service: [name], [name] 12/17/2022 7:30 A M Medical Record Number: [id number] Patient Account Number: [id number] Date of Birth/Sex: Treating RN: Aug 11, 1940 (82 y.o. [name] Primary Care [name]: [name] Other Clinician: [name] Referring [name]: Treating [name]/Extender: [name] in Treatment: 5 HBO Treatment Course Details Treatment Course Number: 1 Ordering [name]: [name] T Treatments Ordered: otal 40 HBO Treatment Start Date: 11/11/2022 HBO Indication: Late Effect of Radiation HBO Treatment Details Treatment Number: 26 Patient Type: Outpatient Chamber Type: Monoplace Chamber Serial #: [id number] Treatment Protocol: 2.0 ATA with 90 minutes oxygen, and no air breaks Treatment Details Compression Rate Down: 1.5 psi / minute De-Compression Rate Up: 1.5 psi / minute Air breaks and breathing Decompress Decompress Compress Tx Pressure Begins Reached periods Begins Ends (leave unused spaces blank) Chamber Pressure (ATA 1 2 ------2 1 ) Clock Time (24 hr) 08:04 08:14 - - - - - - 09:44 09:53 Treatment Length: 109 (minutes) Treatment Segments: 4 Vital Signs Capillary Blood Glucose Reference Range: 80 - 120 mg / dl HBO Diabetic Blood Glucose Intervention Range: <131 mg/dl or >284 mg/dl Type: Time Vitals Blood Respiratory Capillary Blood Glucose Pulse Action Pulse: Temperature: Taken: Pressure: Rate: Glucose (mg/dl): Meter #: Oximetry (%) Taken: Pre 07:43 110/65 60 18 98.1 none per protocol Post 09:56 134/71 51 18 97.3 none per protocol Treatment Response Treatment Toleration: Well Treatment Completion Status: Treatment Completed without Adverse Event Treatment Notes Mr. Norbut arrived with normal vital signs. After performing a safety check, he was placed in  the chamber which was compressed at the rate of 2 psi/min after confirming normal ear equalization. He tolerated the treatment and subsequent decompression of the chamber at the rate of 2 psi/min. He denied issues with ear equalization and/or pain caused by barotrauma. Post-treatment heart rate was 51 bpm. He denied symptoms of bradycardia and was visibly asymptomatic as well. He was stable upon discharge. [name] Notes No concerns with treatment given Physician HBO Attestation: I certify that I supervised this HBO treatment in accordance with Medicare guidelines. A trained emergency response team is readily available per Yes hospital policies and procedures. Continue HBOT as ordered. Yes Electronic Signature(s) Signed: 12/17/2022 4:19:19 PM By: [name] Najjar MD Elliff, LACIE11/19/2024 4:19:19 PM By: [name] Najjar MD SignedKathie [name] ([id number]) [id number] Page 2 of 2 Previous Signature: 12/17/2022 1:29:40 PM Version By: [name] CHT EMT BS , , Entered By: [name] on 12/17/2022 12:55:49 -------------------------------------------------------------------------------- HBO Safety Checklist Details Patient Name: Date of Service: [name], [name] 12/17/2022 7:30 A M Medical Record Number: [id number] Patient Account Number: [id number] Date of Birth/Sex: Treating RN: 07/09/1940 (82 y.o. [name] Primary Care Flavius Repsher: [name] Other Clinician: [name] Referring Lyonel Morejon: Treating Cheron Pasquarelli/Extender: [name] in Treatment: 5 HBO Safety Checklist Items Safety Checklist Consent Form Signed Patient voided / foley secured and emptied When did you last eato 0700 (not diabetic) Last dose of injectable or oral agent n/a Ostomy pouch emptied and vented if applicable NA All implantable devices assessed, documented and approved NA Intravenous access site secured and place NA Valuables  secured Linens and cotton and cotton/polyester blend (less than 51% polyester) Personal oil-based products / skin lotions / body lotions removed Wigs or hairpieces removed NA Smoking or tobacco materials removed NA Books / newspapers / magazines / loose paper removed Cologne, aftershave, perfume and deodorant removed  Jewelry removed (may wrap wedding band) Make-up removed NA Hair care products removed Battery operated devices (external) removed Heating patches and chemical warmers removed Titanium eyewear removed Nail polish cured greater than 10 hours NA Casting material cured greater than 10 hours NA Hearing aids removed NA Loose dentures or partials removed NA Prosthetics have been removed NA Patient demonstrates correct use of air break device (if applicable) Patient concerns have been addressed Patient grounding bracelet on and cord attached to chamber Specifics for Inpatients (complete in addition to above) Medication sheet sent with patient NA Intravenous medications needed or due during therapy sent with patient NA Drainage tubes (e.g. nasogastric tube or chest tube secured and vented) NA Endotracheal or Tracheotomy tube secured NA Cuff deflated of air and inflated with saline NA Airway suctioned NA Notes Paper version used prior to treatment start. Electronic Signature(s) Signed: 12/17/2022 1:26:43 PM By: [name] CHT EMT BS , , Entered By: [name] on 12/17/2022 10:26:43

## 2022-12-18 ENCOUNTER — Encounter (HOSPITAL_BASED_OUTPATIENT_CLINIC_OR_DEPARTMENT_OTHER): Payer: Medicare HMO | Admitting: General Surgery

## 2022-12-18 DIAGNOSIS — Z8546 Personal history of malignant neoplasm of prostate: Secondary | ICD-10-CM | POA: Diagnosis not present

## 2022-12-18 DIAGNOSIS — N3041 Irradiation cystitis with hematuria: Secondary | ICD-10-CM | POA: Diagnosis not present

## 2022-12-18 DIAGNOSIS — L598 Other specified disorders of the skin and subcutaneous tissue related to radiation: Secondary | ICD-10-CM | POA: Diagnosis not present

## 2022-12-18 DIAGNOSIS — I10 Essential (primary) hypertension: Secondary | ICD-10-CM | POA: Diagnosis not present

## 2022-12-18 DIAGNOSIS — Z923 Personal history of irradiation: Secondary | ICD-10-CM | POA: Diagnosis not present

## 2022-12-18 DIAGNOSIS — R351 Nocturia: Secondary | ICD-10-CM | POA: Diagnosis not present

## 2022-12-18 NOTE — Progress Notes (Addendum)
ZEBULUN, BLYTHE ([id number]) [id number] Page 1 of 2 Visit Report for 12/18/2022 HBO Details Patient Name: Date of Service: [name] [name] 12/18/2022 7:20 A M Medical Record Number: [id number] Patient Account Number: [id number] Date of Birth/Sex: Treating RN: 05-28-1940 (82 y.o. [name] [name], Millard.Loa Primary Care Kenyette Gundy: Charissa Bash Other Clinician: Haywood Pao Referring Leah Skora: Treating Fredric Slabach/Extender: Morene Antu in Treatment: 6 HBO Treatment Course Details Treatment Course Number: 1 Ordering Caetano Oberhaus: Lenda Kelp T Treatments Ordered: otal 40 HBO Treatment Start Date: 11/11/2022 HBO Indication: Late Effect of Radiation HBO Treatment Details Treatment Number: 27 Patient Type: Outpatient Chamber Type: Monoplace Chamber Serial #: [id number] Treatment Protocol: 2.0 ATA with 90 minutes oxygen, and no air breaks Treatment Details Compression Rate Down: 1.5 psi / minute De-Compression Rate Up: 1.5 psi / minute Air breaks and breathing Decompress Decompress Compress Tx Pressure Begins Reached periods Begins Ends (leave unused spaces blank) Chamber Pressure (ATA 1 2 ------2 1 ) Clock Time (24 hr) 07:50 08:02 - - - - - - 09:33 09:40 Treatment Length: 110 (minutes) Treatment Segments: 4 Vital Signs Capillary Blood Glucose Reference Range: 80 - 120 mg / dl HBO Diabetic Blood Glucose Intervention Range: <131 mg/dl or >841 mg/dl Type: Time Vitals Blood Respiratory Capillary Blood Glucose Pulse Action Pulse: Temperature: Taken: Pressure: Rate: Glucose (mg/dl): Meter #: Oximetry (%) Taken: Pre 07:45 133/72 66 18 97.2 none per protocol Post 09:41 139/60 45 12 97.3 none per protocol Treatment Response Treatment Toleration: Well Treatment Completion Status: Treatment Completed without Adverse Event Treatment Notes Mr. [name] [name] arrived with normal vital signs. After performing a safety check, he was placed in  the chamber which was compressed at the rate of 2 psi/min after confirming normal ear equalization. He tolerated the treatment and subsequent decompression of the chamber at the rate of 2 psi/min. He denied issues with ear equalization and/or pain caused by barotrauma. Post treatment vital signs were within normal range, except heart rate of 54 bpm. He stated that he felt fine and denied symptoms of bradycardia. He was stable upon discharge. [name] [name] Notes No concerns with treatment given Physician HBO Attestation: I certify that I supervised this HBO treatment in accordance with Medicare guidelines. A trained emergency response team is readily available per Yes hospital policies and procedures. Continue HBOT as ordered. Yes Electronic Signature(s) Signed: 12/18/2022 4:37:54 PM By: Baltazar Najjar MD Wilz, LACIE11/20/2024 4:37:54 PM By: Baltazar Najjar MD SignedKathie Rhodes ([id number]) [id number] Page 2 of 2 Previous Signature: 12/18/2022 10:34:56 AM Version By: Haywood Pao CHT EMT BS , , Previous Signature: 12/18/2022 8:45:32 AM Version By: Haywood Pao CHT EMT BS , , Entered By: Baltazar Najjar on 12/18/2022 13:34:04 -------------------------------------------------------------------------------- HBO Safety Checklist Details Patient Name: Date of Service: [name] [name]. 12/18/2022 7:20 A M Medical Record Number: [id number] Patient Account Number: [id number] Date of Birth/Sex: Treating RN: 01-27-1941 (82 y.o. [name] [name], Millard.Loa Primary Care Hyla Coard: Charissa Bash Other Clinician: Haywood Pao Referring Brandace Cargle: Treating Baleigh Rennaker/Extender: Morene Antu in Treatment: 6 HBO Safety Checklist Items Safety Checklist Consent Form Signed Patient voided / foley secured and emptied When did you last eato 7am smoked sausage Last dose of injectable or oral agent n/a Ostomy pouch emptied and vented if applicable NA All  implantable devices assessed, documented and approved NA Intravenous access site secured and place NA Valuables secured Linens and cotton and cotton/polyester blend (less than 51% polyester) Personal oil-based products / skin lotions / body lotions removed Wigs or hairpieces removed  NA Smoking or tobacco materials removed NA Books / newspapers / magazines / loose paper removed Cologne, aftershave, perfume and deodorant removed Jewelry removed (may wrap wedding band) Make-up removed NA Hair care products removed Battery operated devices (external) removed Heating patches and chemical warmers removed Titanium eyewear removed NA Nail polish cured greater than 10 hours NA Casting material cured greater than 10 hours NA Hearing aids removed at home Loose dentures or partials removed dentures removed Prosthetics have been removed NA Patient demonstrates correct use of air break device (if applicable) Patient concerns have been addressed Patient grounding bracelet on and cord attached to chamber Specifics for Inpatients (complete in addition to above) Medication sheet sent with patient NA Intravenous medications needed or due during therapy sent with patient NA Drainage tubes (e.g. nasogastric tube or chest tube secured and vented) NA Endotracheal or Tracheotomy tube secured NA Cuff deflated of air and inflated with saline NA Airway suctioned NA Notes Paper version used prior to treatment Electronic Signature(s) Signed: 12/18/2022 8:44:13 AM By: Haywood Pao CHT EMT BS , , Entered By: Haywood Pao on 12/18/2022 05:44:12

## 2022-12-18 NOTE — Progress Notes (Addendum)
ROYLE, SABATH ([id number]) [id number] Page 1 of 2 Visit Report for 12/18/2022 Arrival Information Details Patient Name: Date of Service: NAKUL, TUFFY 12/18/2022 7:20 A M Medical Record Number: [id number] Patient Account Number: [id number] Date of Birth/Sex: Treating RN: 07-May-1940 (82 y.o. [name], Millard.Loa Primary Care Martell Mcfadyen: Charissa Bash Other Clinician: Daryll Brod Referring Obrien Huskins: Treating Angelissa Supan/Extender: Morene Antu in Treatment: 6 Visit Information History Since Last Visit Added or deleted any medications: No Patient Arrived: Ambulatory Any new allergies or adverse reactions: No Arrival Time: 07:43 Had a fall or experienced change in No Accompanied By: self activities of daily living that may affect Transfer Assistance: None risk of falls: Patient Identification Verified: Yes Signs or symptoms of abuse/neglect since last visito No Secondary Verification Process Completed: Yes Hospitalized since last visit: No Patient Requires Transmission-Based Precautions: No Implantable device outside of the clinic excluding No Patient Has Alerts: No cellular tissue based products placed in the center since last visit: Pain Present Now: No Electronic Signature(s) Signed: 12/18/2022 8:42:49 AM By: Haywood Pao CHT EMT BS , , Entered By: Haywood Pao on 12/18/2022 08:42:49 -------------------------------------------------------------------------------- Encounter Discharge Information Details Patient Name: Date of Service: [name] 12/18/2022 7:20 A M Medical Record Number: [id number] Patient Account Number: [id number] Date of Birth/Sex: Treating RN: 05/11/1940 (82 y.o. [name] Primary Care Jewelia Bocchino: Charissa Bash Other Clinician: Haywood Pao Referring Phung Kotas: Treating Bernardette Waldron/Extender: Morene Antu in Treatment: 6 Encounter Discharge  Information Items Discharge Condition: Stable Ambulatory Status: Ambulatory Discharge Destination: Home Transportation: Private Auto Accompanied By: self Schedule Follow-up Appointment: No Clinical Summary of Care: Electronic Signature(s) Signed: 12/18/2022 10:35:52 AM By: Haywood Pao CHT EMT BS , , Entered By: Haywood Pao on 12/18/2022 10:35:52 [name] ([id number]) [id number] Page 2 of 2 -------------------------------------------------------------------------------- Vitals Details Patient Name: Date of Service: TYQUISE, LEBARON 12/18/2022 7:20 A M Medical Record Number: [id number] Patient Account Number: [id number] Date of Birth/Sex: Treating RN: [birth year] (82 y.o. [name], Millard.Loa Primary Care Renesha Lizama: Charissa Bash Other Clinician: Daryll Brod Referring Briele Lagasse: Treating Kiylah Loyer/Extender: Morene Antu in Treatment: 6 Vital Signs Time Taken: 07:45 Temperature (F): 97.2 Height (in): 71 Pulse (bpm): 66 Weight (lbs): 180 Respiratory Rate (breaths/min): 18 Body Mass Index (BMI): 25.1 Blood Pressure (mmHg): 133/72 Reference Range: 80 - 120 mg / dl Electronic Signature(s) Signed: 12/18/2022 8:43:04 AM By: Haywood Pao CHT EMT BS , , Entered By: Haywood Pao on 12/18/2022 08:43:04

## 2022-12-18 NOTE — Progress Notes (Signed)
[name], [name] ([id number]) [id number] Page 1 of 1 Visit Report for 12/18/2022 SuperBill Details Patient Name: Date of Service: [name], [name] 12/18/2022 Medical Record Number: [id number] Patient Account Number: [id number] Date of Birth/Sex: Treating RN: 1940/09/07 (82 y.o. 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

## 2022-12-19 ENCOUNTER — Encounter (HOSPITAL_BASED_OUTPATIENT_CLINIC_OR_DEPARTMENT_OTHER): Payer: Medicare HMO | Admitting: General Surgery

## 2022-12-19 DIAGNOSIS — Z8546 Personal history of malignant neoplasm of prostate: Secondary | ICD-10-CM | POA: Diagnosis not present

## 2022-12-19 DIAGNOSIS — R351 Nocturia: Secondary | ICD-10-CM | POA: Diagnosis not present

## 2022-12-19 DIAGNOSIS — L598 Other specified disorders of the skin and subcutaneous tissue related to radiation: Secondary | ICD-10-CM | POA: Diagnosis not present

## 2022-12-19 DIAGNOSIS — Z923 Personal history of irradiation: Secondary | ICD-10-CM | POA: Diagnosis not present

## 2022-12-19 DIAGNOSIS — N3041 Irradiation cystitis with hematuria: Secondary | ICD-10-CM | POA: Diagnosis not present

## 2022-12-19 DIAGNOSIS — I10 Essential (primary) hypertension: Secondary | ICD-10-CM | POA: Diagnosis not present

## 2022-12-19 NOTE — Progress Notes (Signed)
[name], [name] ([id number]) [id number] Page 1 of 2 Visit Report for 12/19/2022 HBO Details Patient Name: Date of Service: [name], [name] 12/19/2022 7:20 A M Medical Record Number: [id number] Patient Account Number: [id number] Date of Birth/Sex: Treating RN: November 09, 1940 (82 y.o. [name] Primary Care [name]: [name] Other Clinician: Daryll [name] Referring [name]: Treating [name]/Extender: [name] in Treatment: 6 HBO Treatment Course Details Treatment Course Number: 1 Ordering [name]: Lenda Kelp T Treatments Ordered: otal 40 HBO Treatment Start Date: 11/11/2022 HBO Indication: Late Effect of Radiation HBO Treatment Details Treatment Number: 28 Patient Type: Outpatient Chamber Type: Monoplace Chamber Serial #: [id number] Treatment Protocol: 2.0 ATA with 90 minutes oxygen, and no air breaks Treatment Details Compression Rate Down: 2.0 psi / minute De-Compression Rate Up: 2.0 psi / minute Air breaks and breathing Decompress Decompress Compress Tx Pressure Begins Reached periods Begins Ends (leave unused spaces blank) Chamber Pressure (ATA 1 2 ------2 1 ) Clock Time (24 hr) 8:00 8:14 - - - - - - 9:45 9:51 Treatment Length: 111 (minutes) Treatment Segments: 4 Vital Signs Capillary Blood Glucose Reference Range: 80 - 120 mg / dl HBO Diabetic Blood Glucose Intervention Range: <131 mg/dl or >644 mg/dl Time Vitals Blood Respiratory Capillary Blood Glucose Pulse Action Type: Pulse: Temperature: Taken: Pressure: Rate: Glucose (mg/dl): Meter #: Oximetry (%) Taken: Pre 07:45 117/74 58 18 97.3 Post 09:51 141/69 50 16 Treatment Response Treatment Toleration: Well Treatment Completion Status: Treatment Completed without Adverse Event Treatment Notes I certify that I directed and performed operation of said chamber for this treatment. [name] arrived with normal vital  signs except heart rate of 58 bpm. He was asymptomatic for bradycardia, stated that he felt good. After performing a safety check, he was placed in the chamber which was compressed at the rate of 2 psi/min after confirming normal ear equalization. He tolerated the treatment and subsequent decompression of the chamber at the rate of 2 psi/min. He denied issues with ear equalization and/or pain caused by barotrauma. Post treatment vital signs were within normal range, except heart rate of 50 bpm. He stated that he felt fine and denied symptoms of bradycardia. He was stable upon discharge. Physician HBO Attestation: I certify that I supervised this HBO treatment in accordance with Medicare guidelines. A trained emergency response team is readily available per Yes hospital policies and procedures. Continue HBOT as ordered. Yes Electronic Signature(s) Signed: 12/19/2022 4:50:01 PM By: Duanne Guess MD FACS [name], LACIE11/21/2024 4:50:01 PM By: Duanne Guess MD FACS SignedKathie [name] ([id number]) [id number] Page 2 of 2 Previous Signature: 12/19/2022 4:38:43 PM Version By: [name] CHT EMT BS , , Previous Signature: 12/19/2022 11:03:38 AM Version By: Duanne Guess MD FACS Entered By: Duanne [name] on 12/19/2022 16:50:00 -------------------------------------------------------------------------------- HBO Safety Checklist Details Patient Name: Date of Service: [name]. 12/19/2022 7:20 A M Medical Record Number: [id number] Patient Account Number: [id number] Date of Birth/Sex: Treating RN: 1940-10-19 (82 y.o. [name] Primary Care Averyanna Sax: [name] Other Clinician: Daryll [name] Referring Bonnell Placzek: Treating Oluwadamilola Deliz/Extender: [name] in Treatment: 6 HBO Safety Checklist Items Safety Checklist Consent Form Signed Patient voided / foley secured and emptied When did you last eato 5:55 liver pudding  eggs Last dose of injectable or oral agent Ostomy pouch emptied and vented if applicable NA All implantable devices assessed, documented and approved NA Intravenous access site secured and place NA Valuables secured Linens and cotton and cotton/polyester blend (less than  51% polyester) Personal oil-based products / skin lotions / body lotions removed Wigs or hairpieces removed NA Smoking or tobacco materials removed NA Books / newspapers / magazines / loose paper removed Cologne, aftershave, perfume and deodorant removed Jewelry removed (may wrap wedding band) Make-up removed NA Hair care products removed NA Battery operated devices (external) removed NA Heating patches and chemical warmers removed NA Titanium eyewear removed NA Nail polish cured greater than 10 hours NA Casting material cured greater than 10 hours NA Hearing aids removed Loose dentures or partials removed NA Prosthetics have been removed NA Patient demonstrates correct use of air break device (if applicable) Patient concerns have been addressed Patient grounding bracelet on and cord attached to chamber Specifics for Inpatients (complete in addition to above) Medication sheet sent with patient NA Intravenous medications needed or due during therapy sent with patient NA Drainage tubes (e.g. nasogastric tube or chest tube secured and vented) NA Endotracheal or Tracheotomy tube secured NA Cuff deflated of air and inflated with saline NA Airway suctioned NA Notes Paper version used prior to treatment start. Electronic Signature(s) Signed: 12/19/2022 4:36:55 PM By: [name] CHT EMT BS , , Entered By: [name] on 12/19/2022 16:36:55

## 2022-12-20 ENCOUNTER — Encounter (HOSPITAL_BASED_OUTPATIENT_CLINIC_OR_DEPARTMENT_OTHER): Payer: Medicare HMO | Admitting: General Surgery

## 2022-12-20 DIAGNOSIS — R351 Nocturia: Secondary | ICD-10-CM | POA: Diagnosis not present

## 2022-12-20 DIAGNOSIS — L598 Other specified disorders of the skin and subcutaneous tissue related to radiation: Secondary | ICD-10-CM | POA: Diagnosis not present

## 2022-12-20 DIAGNOSIS — Z8546 Personal history of malignant neoplasm of prostate: Secondary | ICD-10-CM | POA: Diagnosis not present

## 2022-12-20 DIAGNOSIS — N3041 Irradiation cystitis with hematuria: Secondary | ICD-10-CM | POA: Diagnosis not present

## 2022-12-20 DIAGNOSIS — Z923 Personal history of irradiation: Secondary | ICD-10-CM | POA: Diagnosis not present

## 2022-12-20 DIAGNOSIS — I10 Essential (primary) hypertension: Secondary | ICD-10-CM | POA: Diagnosis not present

## 2022-12-20 NOTE — Progress Notes (Signed)
HIDEKI, PERTEET ([id number]) [id number] Page 1 of 1 Visit Report for 12/20/2022 SuperBill Details Patient Name: Date of Service: [name], [name] 12/20/2022 Medical Record Number: [id number] Patient Account Number: [id number] Date of Birth/Sex: Treating RN: March 11, 1940 (82 y.o. 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

## 2022-12-20 NOTE — Progress Notes (Signed)
BERTIN, CACCIATORE ([id number]) [id number] Page 1 of 2 Visit Report for 12/20/2022 Arrival Information Details Patient Name: Date of Service: [name], [name] 12/20/2022 7:20 A M Medical Record Number: [id number] Patient Account Number: [id number] Date of Birth/Sex: Treating RN: [birth year] (82 y.o. [name], Millard.Loa Primary Care Atlee Kluth: Charissa Bash Other Clinician: Daryll Brod Referring Kimmberly Wisser: Treating Prescilla Monger/Extender: Claris Gower in Treatment: 6 Visit Information History Since Last Visit Added or deleted any medications: No Patient Arrived: Ambulatory Any new allergies or adverse reactions: No Arrival Time: 07:40 Had a fall or experienced change in No Accompanied By: self activities of daily living that may affect Transfer Assistance: None risk of falls: Patient Identification Verified: Yes Signs or symptoms of abuse/neglect since last visito No Secondary Verification Process Completed: Yes Hospitalized since last visit: No Patient Requires Transmission-Based Precautions: No Implantable device outside of the clinic excluding No Patient Has Alerts: No cellular tissue based products placed in the center since last visit: Pain Present Now: No Electronic Signature(s) Signed: 12/20/2022 12:13:00 PM By: Demetria Pore Entered By: Demetria Pore on 12/20/2022 10:39:31 -------------------------------------------------------------------------------- Encounter Discharge Information Details Patient Name: Date of Service: [name] Riffle. 12/20/2022 7:20 A M Medical Record Number: [id number] Patient Account Number: [id number] Date of Birth/Sex: Treating RN: 1940-06-18 (82 y.o. [name], Millard.Loa Primary Care Aryahi Denzler: Charissa Bash Other Clinician: Daryll Brod Referring Katiria Calame: Treating Ravin Denardo/Extender: Claris Gower in Treatment: 6 Encounter Discharge Information Items Discharge  Condition: Stable Ambulatory Status: Ambulatory Discharge Destination: Home Transportation: Private Auto Accompanied By: self Schedule Follow-up Appointment: Yes Clinical Summary of Care: Electronic Signature(s) Signed: 12/20/2022 12:13:00 PM By: Demetria Pore Entered By: Demetria Pore on 12/20/2022 10:44:24 [name] ([id number]) [id number] Page 2 of 2 -------------------------------------------------------------------------------- Vitals Details Patient Name: Date of Service: [name], [name] 12/20/2022 7:20 A M Medical Record Number: [id number] Patient Account Number: [id number] Date of Birth/Sex: Treating RN: 1940-09-01 (82 y.o. [name], Millard.Loa Primary Care Kern Gingras: Charissa Bash Other Clinician: Daryll Brod Referring Katelinn Justice: Treating Jaja Switalski/Extender: Claris Gower in Treatment: 6 Vital Signs Time Taken: 07:40 Temperature (F): 97.5 Height (in): 71 Pulse (bpm): 61 Weight (lbs): 180 Respiratory Rate (breaths/min): 18 Body Mass Index (BMI): 25.1 Blood Pressure (mmHg): 119/66 Reference Range: 80 - 120 mg / dl Electronic Signature(s) Signed: 12/20/2022 12:13:00 PM By: Demetria Pore Entered By: Demetria Pore on 12/20/2022 10:40:05

## 2022-12-20 NOTE — Progress Notes (Signed)
MCCRAY, ANGERMEIER ([id number]) [id number] Page 1 of 2 Visit Report for 12/19/2022 Arrival Information Details Patient Name: Date of Service: [name], [name] 12/19/2022 7:20 A M Medical Record Number: [id number] Patient Account Number: [id number] Date of Birth/Sex: Treating RN: 14-Mar-1940 (82 y.o. [name], [name] Primary Care [name]: [name] Other Clinician: Daryll [name] Referring [name]: Treating [name]/Extender: [name] in Treatment: 6 Visit Information History Since Last Visit Added or deleted any medications: No Patient Arrived: Ambulatory Any new allergies or adverse reactions: No Arrival Time: 07:40 Had a fall or experienced change in No Accompanied By: self activities of daily living that may affect Transfer Assistance: None risk of falls: Patient Identification Verified: Yes Signs or symptoms of abuse/neglect since last visito No Secondary Verification Process Completed: Yes Hospitalized since last visit: No Patient Requires Transmission-Based Precautions: No Implantable device outside of the clinic excluding No Patient Has Alerts: No cellular tissue based products placed in the center since last visit: Pain Present Now: No Electronic Signature(s) Signed: 12/19/2022 4:36:24 PM By: [name] CHT EMT BS , , Entered By: [name] on 12/19/2022 16:36:24 -------------------------------------------------------------------------------- Encounter Discharge Information Details Patient Name: Date of Service: [name]. 12/19/2022 7:20 A M Medical Record Number: [id number] Patient Account Number: [id number] Date of Birth/Sex: Treating RN: Sep 08, 1940 (82 y.o. [name] Primary Care [name]: [name] Other Clinician: Daryll [name] Referring [name]: Treating [name]/Extender: [name] in Treatment: 6 Encounter Discharge  Information Items Discharge Condition: Stable Ambulatory Status: Ambulatory Discharge Destination: Home Transportation: Private Auto Accompanied By: self Schedule Follow-up Appointment: Yes Clinical Summary of Care: Electronic Signature(s) Signed: 12/20/2022 12:13:00 PM By: Demetria Pore Entered By: Demetria Pore on 12/19/2022 10:48:19 [name] ([id number]) [id number] Page 2 of 2 -------------------------------------------------------------------------------- Vitals Details Patient Name: Date of Service: [name], [name] 12/19/2022 7:20 A M Medical Record Number: [id number] Patient Account Number: [id number] Date of Birth/Sex: Treating RN: March 03, 1940 (82 y.o. [name] Primary Care [name]: [name] Other Clinician: Daryll [name] Referring [name]: Treating [name] in Treatment: 6 Vital Signs Time Taken: 07:45 Temperature (F): 97.3 Height (in): 71 Pulse (bpm): 58 Weight (lbs): 180 Respiratory Rate (breaths/min): 18 Body Mass Index (BMI): 25.1 Blood Pressure (mmHg): 117/74 Reference Range: 80 - 120 mg / dl Electronic Signature(s) Signed: 12/19/2022 4:36:32 PM By: [name] CHT EMT BS , , Entered By: [name] on 12/19/2022 16:36:32

## 2022-12-20 NOTE — Progress Notes (Signed)
IKEEM, SCHOENIG ([id number]) [id number] Page 1 of 1 Visit Report for 12/19/2022 SuperBill Details Patient Name: Date of Service: [name], [name] 12/19/2022 Medical Record Number: [id number] Patient Account Number: [id number] Date of Birth/Sex: Treating RN: 1940/09/28 (82 y.o. 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

## 2022-12-20 NOTE — Progress Notes (Signed)
[name], [name] ([id number]) [id number] Page 1 of 2 Visit Report for 12/20/2022 HBO Details Patient Name: Date of Service: [name], [name] 12/20/2022 7:20 A M Medical Record Number: [id number] Patient Account Number: [id number] Date of Birth/Sex: Treating RN: 11/26/1940 (82 y.o. [name], Millard.Loa Primary Care Suleika Donavan: Charissa Bash Other Clinician: Daryll Brod Referring Teniola Tseng: Treating Shikha Bibb/Extender: Claris Gower in Treatment: 6 HBO Treatment Course Details Treatment Course Number: 1 Ordering Jill Stopka: Lenda Kelp T Treatments Ordered: otal 40 HBO Treatment Start Date: 11/11/2022 HBO Indication: Late Effect of Radiation HBO Treatment Details Treatment Number: 29 Patient Type: Outpatient Chamber Type: Monoplace Chamber Serial #: [id number] Treatment Protocol: 2.0 ATA with 90 minutes oxygen, and no air breaks Treatment Details Compression Rate Down: 2.0 psi / minute De-Compression Rate Up: 2.0 psi / minute Air breaks and breathing Decompress Decompress Compress Tx Pressure Begins Reached periods Begins Ends (leave unused spaces blank) Chamber Pressure (ATA 1 2 ------2 1 ) Clock Time (24 hr) 7:46 8:00 - - - - - - 9:30 9:38 Treatment Length: 112 (minutes) Treatment Segments: 4 Vital Signs Capillary Blood Glucose Reference Range: 80 - 120 mg / dl HBO Diabetic Blood Glucose Intervention Range: <131 mg/dl or >841 mg/dl Time Vitals Blood Respiratory Capillary Blood Glucose Pulse Action Type: Pulse: Temperature: Taken: Pressure: Rate: Glucose (mg/dl): Meter #: Oximetry (%) Taken: Pre 07:40 119/66 61 18 97.5 Post 09:39 119/65 51 18 97.3 Treatment Response Treatment Toleration: Well Treatment Completion Status: Treatment Completed without Adverse Event Treatment Notes I certify that I directed and performed operation of said chamber for this treatment. [place] Mr. Bauser arrived with normal vital  signs. After performing a safety check, he was placed in the chamber which was compressed at the rate of 2 psi/min after confirming normal ear equalization. He tolerated the treatment and subsequent decompression of the chamber at the rate of 2 psi/min. He denied issues with ear equalization and/or pain caused by barotrauma. Post treatment vital signs were within normal range, except heart rate of 51 bpm. He stated that he felt fine and denied symptoms of bradycardia. He was stable upon discharge. Physician HBO Attestation: I certify that I supervised this HBO treatment in accordance with Medicare guidelines. A trained emergency response team is readily available per Yes hospital policies and procedures. Continue HBOT as ordered. Yes Electronic Signature(s) Signed: 12/20/2022 2:41:37 PM By: Haywood Pao CHT EMT BS , , Signed: 12/23/2022 8:43:29 AM By: Duanne Guess MD FACS Mom, LACIE11/25/2024 8:43:29 AM By: Duanne Guess MD FACS SignedKathie Rhodes ([id number]) [id number] Page 2 of 2 Previous Signature: 12/20/2022 12:43:45 PM Version By: Duanne Guess MD FACS Previous Signature: 12/20/2022 12:13:00 PM Version By: Demetria Pore Entered By: Haywood Pao on 12/20/2022 11:41:37 -------------------------------------------------------------------------------- HBO Safety Checklist Details Patient Name: Date of Service: [name]. 12/20/2022 7:20 A M Medical Record Number: [id number] Patient Account Number: [id number] Date of Birth/Sex: Treating RN: 04-30-1940 (82 y.o. [name] Primary Care Lofton Leon: Charissa Bash Other Clinician: Daryll Brod Referring Jamaria Amborn: Treating Naileah Karg/Extender: Claris Gower in Treatment: 6 HBO Safety Checklist Items Safety Checklist Consent Form Signed Patient voided / foley secured and emptied When did you last eato chicken sausage biscuit Last dose of injectable or oral  agent Ostomy pouch emptied and vented if applicable NA All implantable devices assessed, documented and approved NA Intravenous access site secured and place NA Valuables secured Linens and cotton and cotton/polyester blend (less than 51% polyester) Personal oil-based products / skin lotions /  body lotions removed Wigs or hairpieces removed NA Smoking or tobacco materials removed NA Books / newspapers / magazines / loose paper removed Cologne, aftershave, perfume and deodorant removed Jewelry removed (may wrap wedding band) Make-up removed NA Hair care products removed Battery operated devices (external) removed NA Heating patches and chemical warmers removed NA Titanium eyewear removed NA Nail polish cured greater than 10 hours NA Casting material cured greater than 10 hours NA Hearing aids removed NA Loose dentures or partials removed NA Prosthetics have been removed NA Patient demonstrates correct use of air break device (if applicable) Patient concerns have been addressed Patient grounding bracelet on and cord attached to chamber Specifics for Inpatients (complete in addition to above) Medication sheet sent with patient NA Intravenous medications needed or due during therapy sent with patient NA Drainage tubes (e.g. nasogastric tube or chest tube secured and vented) NA Endotracheal or Tracheotomy tube secured NA Cuff deflated of air and inflated with saline NA Airway suctioned NA Electronic Signature(s) Signed: 12/20/2022 12:13:00 PM By: Demetria Pore Entered By: Demetria Pore on 12/20/2022 07:42:20

## 2022-12-23 ENCOUNTER — Encounter (HOSPITAL_BASED_OUTPATIENT_CLINIC_OR_DEPARTMENT_OTHER): Payer: Medicare HMO | Admitting: General Surgery

## 2022-12-23 DIAGNOSIS — I10 Essential (primary) hypertension: Secondary | ICD-10-CM | POA: Diagnosis not present

## 2022-12-23 DIAGNOSIS — Z923 Personal history of irradiation: Secondary | ICD-10-CM | POA: Diagnosis not present

## 2022-12-23 DIAGNOSIS — Z8546 Personal history of malignant neoplasm of prostate: Secondary | ICD-10-CM | POA: Diagnosis not present

## 2022-12-23 DIAGNOSIS — R351 Nocturia: Secondary | ICD-10-CM | POA: Diagnosis not present

## 2022-12-23 DIAGNOSIS — L598 Other specified disorders of the skin and subcutaneous tissue related to radiation: Secondary | ICD-10-CM | POA: Diagnosis not present

## 2022-12-23 DIAGNOSIS — N3041 Irradiation cystitis with hematuria: Secondary | ICD-10-CM | POA: Diagnosis not present

## 2022-12-23 NOTE — Progress Notes (Signed)
[name], [name] ([id number]) [id number] Page 1 of 2 Visit Report for 12/23/2022 HBO Details Patient Name: Date of Service: [name], [name] 12/23/2022 7:20 A M Medical Record Number: [id number] Patient Account Number: [id number] Date of Birth/Sex: Treating RN: Dec 03, 1940 (82 y.o. [name], [name] Primary Care [name]: [name] Other Clinician: Daryll [name] Referring [name]: Treating [name]/Extender: [name] in Treatment: 6 HBO Treatment Course Details Treatment Course Number: 1 Ordering [name]: [name] T Treatments Ordered: otal 40 HBO Treatment Start Date: 11/11/2022 HBO Indication: Late Effect of Radiation HBO Treatment Details Treatment Number: 30 Patient Type: Outpatient Chamber Type: Monoplace Chamber Serial #: [id number] Treatment Protocol: 2.0 ATA with 90 minutes oxygen, and no air breaks Treatment Details Compression Rate Down: 2.0 psi / minute De-Compression Rate Up: 2.0 psi / minute Air breaks and breathing Decompress Decompress Compress Tx Pressure Begins Reached periods Begins Ends (leave unused spaces blank) Chamber Pressure (ATA 1 2 ------2 1 ) Clock Time (24 hr) 7:53 8:09 - - - - - - 9:38 9:46 Treatment Length: 113 (minutes) Treatment Segments: 4 Vital Signs Capillary Blood Glucose Reference Range: 80 - 120 mg / dl HBO Diabetic Blood Glucose Intervention Range: <131 mg/dl or >782 mg/dl Time Vitals Blood Respiratory Capillary Blood Glucose Pulse Action Type: Pulse: Temperature: Taken: Pressure: Rate: Glucose (mg/dl): Meter #: Oximetry (%) Taken: Pre 07:50 127/89 88 16 98 Post 09:46 122/78 86 18 97.2 Treatment Response Treatment Toleration: Well Treatment Completion Status: Treatment Completed without Adverse Event Treatment Notes I certify that I directed and performed operation of said chamber for this treatment. [name] arrived and prepared for  treatment. His vital signs were within normal range. After performing a safety check, patient was placed in the chamber which was compressed with 100% oxygen at a rate of 2 psi/min after confirming normal ear equalization. He tolerated the treatment and subsequent decompression at the rate of 2 psi/min. His post-treatment vital signs were within normal range. He was stable upon discharge. Physician HBO Attestation: I certify that I supervised this HBO treatment in accordance with Medicare guidelines. A trained emergency response team is readily available per Yes hospital policies and procedures. Continue HBOT as ordered. Yes Electronic Signature(s) Signed: 12/23/2022 1:23:28 PM By: [name] Guess MD FACS Previous Signature: 12/23/2022 11:21:23 AM Version By: [name] CHT EMT BS , , Entered By: [name] on 12/23/2022 10:23:28 [name], [name] ([id number]) [id number] Page 2 of 2 -------------------------------------------------------------------------------- HBO Safety Checklist Details Patient Name: Date of Service: [name], [name] 12/23/2022 7:20 A M Medical Record Number: [id number] Patient Account Number: [id number] Date of Birth/Sex: Treating RN: May 16, 1940 (82 y.o. [name], [name] Primary Care Regina [name]: [name] Other Clinician: Daryll [name] Referring [name]: Treating [name]/Extender: [name] in Treatment: 6 HBO Safety Checklist Items Safety Checklist Consent Form Signed Patient voided / foley secured and emptied When did you last eato 6:50 sausage Last dose of injectable or oral agent Ostomy pouch emptied and vented if applicable NA All implantable devices assessed, documented and approved NA Intravenous access site secured and place NA Valuables secured Linens and cotton and cotton/polyester blend (less than 51% polyester) Personal oil-based products / skin lotions / body lotions  removed Wigs or hairpieces removed NA Smoking or tobacco materials removed NA Books / newspapers / magazines / loose paper removed Cologne, aftershave, perfume and deodorant removed Jewelry removed (may wrap wedding band) Make-up removed NA Hair care products removed Battery operated devices (external) removed NA  Heating patches and chemical warmers removed NA Titanium eyewear removed NA Nail polish cured greater than 10 hours NA Casting material cured greater than 10 hours NA Hearing aids removed Loose dentures or partials removed Prosthetics have been removed NA Patient demonstrates correct use of air break device (if applicable) Patient concerns have been addressed Patient grounding bracelet on and cord attached to chamber Specifics for Inpatients (complete in addition to above) Medication sheet sent with patient NA Intravenous medications needed or due during therapy sent with patient NA Drainage tubes (e.g. nasogastric tube or chest tube secured and vented) NA Endotracheal or Tracheotomy tube secured NA Cuff deflated of air and inflated with saline NA Airway suctioned NA Notes Paper version used prior to treatment start. Electronic Signature(s) Signed: 12/23/2022 11:18:11 AM By: [name] CHT EMT BS , , Previous Signature: 12/23/2022 11:17:55 AM Version By: [name] CHT EMT BS , , Entered By: [name] on 12/23/2022 08:18:11

## 2022-12-24 ENCOUNTER — Encounter (HOSPITAL_BASED_OUTPATIENT_CLINIC_OR_DEPARTMENT_OTHER): Payer: Medicare HMO | Admitting: General Surgery

## 2022-12-24 DIAGNOSIS — I10 Essential (primary) hypertension: Secondary | ICD-10-CM | POA: Diagnosis not present

## 2022-12-24 DIAGNOSIS — L598 Other specified disorders of the skin and subcutaneous tissue related to radiation: Secondary | ICD-10-CM | POA: Diagnosis not present

## 2022-12-24 DIAGNOSIS — N3041 Irradiation cystitis with hematuria: Secondary | ICD-10-CM | POA: Diagnosis not present

## 2022-12-24 DIAGNOSIS — R351 Nocturia: Secondary | ICD-10-CM | POA: Diagnosis not present

## 2022-12-24 DIAGNOSIS — Z8546 Personal history of malignant neoplasm of prostate: Secondary | ICD-10-CM | POA: Diagnosis not present

## 2022-12-24 DIAGNOSIS — Z923 Personal history of irradiation: Secondary | ICD-10-CM | POA: Diagnosis not present

## 2022-12-24 NOTE — Progress Notes (Signed)
XZAVIEN, SANTORA ([id number]) [id number] Page 1 of 1 Visit Report for 12/24/2022 SuperBill Details Patient Name: Date of Service: VAHIN, DISCHER 12/24/2022 Medical Record Number: [id number] Patient Account Number: [id number] Date of Birth/Sex: Treating RN: Apr 24, 1940 (82 y.o. [name] Primary Care Provider: Charissa Bash Other Clinician: Haywood Pao Referring Provider: Treating Provider/Extender: Claris Gower in Treatment: 6 Diagnosis Coding ICD-10 Codes Code Description N30.41 Irradiation cystitis with hematuria R35.1 Nocturia I10 Essential (primary) hypertension Facility Procedures CPT4 Code Description Modifier Quantity [id number] G0277-(Facility Use Only) HBOT full body chamber, , 4 ICD-10 Diagnosis Description N30.41 Irradiation cystitis with hematuria R35.1 Nocturia I10 Essential (primary) hypertension Physician Procedures Quantity CPT4 Code Description Modifier [id number] - WC PHYS HYPERBARIC OXYGEN THERAPY 1 ICD-10 Diagnosis Description N30.41 Irradiation cystitis with hematuria R35.1 Nocturia I10 Essential (primary) hypertension Electronic Signature(s) Signed: 12/24/2022 1:36:02 PM By: Haywood Pao CHT EMT BS , , Signed: 12/24/2022 1:54:30 PM By: Duanne Guess MD FACS Entered By: Haywood Pao on 12/24/2022 13:36:02

## 2022-12-24 NOTE — Progress Notes (Signed)
[name], [name] ([id number]) [id number] Page 1 of 2 Visit Report for 12/24/2022 HBO Details Patient Name: Date of Service: [name], [name] 12/24/2022 7:20 A M Medical Record Number: [id number] Patient Account Number: [id number] Date of Birth/Sex: Treating RN: 1940-08-20 (82 y.o. [name] Primary Care [name]: [name] Other Clinician: [name] Referring [name]: Treating [name]/Extender: [name] in Treatment: 6 HBO Treatment Course Details Treatment Course Number: 1 Ordering [name]: Lenda Kelp T Treatments Ordered: otal 40 HBO Treatment Start Date: 11/11/2022 HBO Indication: Late Effect of Radiation HBO Treatment Details Treatment Number: 31 Patient Type: Outpatient Chamber Type: Monoplace Chamber Serial #: [id number] Treatment Protocol: 2.0 ATA with 90 minutes oxygen, and no air breaks Treatment Details Compression Rate Down: 1.5 psi / minute De-Compression Rate Up: 2.0 psi / minute Air breaks and breathing Decompress Decompress Compress Tx Pressure Begins Reached periods Begins Ends (leave unused spaces blank) Chamber Pressure (ATA 1 2 ------2 1 ) Clock Time (24 hr) 10:05 10:16 - - - - - - 11:46 11:55 Treatment Length: 110 (minutes) Treatment Segments: 4 Vital Signs Capillary Blood Glucose Reference Range: 80 - 120 mg / dl HBO Diabetic Blood Glucose Intervention Range: <131 mg/dl or >962 mg/dl Type: Time Vitals Blood Respiratory Capillary Blood Glucose Pulse Action Pulse: Temperature: Taken: Pressure: Rate: Glucose (mg/dl): Meter #: Oximetry (%) Taken: Pre 09:46 149/90 53 18 98.4 none per protocol Post 11:56 143/76 53 18 97 Treatment Response Treatment Toleration: Well Treatment Completion Status: Treatment Completed without Adverse Event Treatment Notes [name] arrived and prepared for treatment. His vital signs were within normal range except heart rate of 53  bpm. He denied any symptoms of bradycardia, stating that he felt fine. After performing a safety check, patient was placed in the chamber which was compressed with 100% oxygen at a rate of 2 psi/min after confirming normal ear equalization. He tolerated the treatment and subsequent decompression at the rate of 2 psi/min. He denied any issues with ear equalization and/or pain caused by barotrauma. His post-treatment vital signs were within normal range except heart rate of 53 bpm. He, again, stated that he felt fine and had no concerning symptoms of bradycardia. He was stable upon discharge. Physician HBO Attestation: I certify that I supervised this HBO treatment in accordance with Medicare guidelines. A trained emergency response team is readily available per Yes hospital policies and procedures. Continue HBOT as ordered. Yes Electronic Signature(s) Signed: 12/24/2022 5:34:24 PM By: [name] Guess MD FACS Previous Signature: 12/24/2022 1:35:48 PM Version By: [name] CHT EMT BS , , Entered By: [name] on 12/24/2022 17:34:24 [name] ([id number]) [id number] Page 2 of 2 -------------------------------------------------------------------------------- HBO Safety Checklist Details Patient Name: Date of Service: [name], [name] 12/24/2022 7:20 A M Medical Record Number: [id number] Patient Account Number: [id number] Date of Birth/Sex: Treating RN: 1940-09-27 (82 y.o. [name] Primary Care [name]: [name] Other Clinician: [name] Referring [name]: Treating [name]/Extender: [name] in Treatment: 6 HBO Safety Checklist Items Safety Checklist Consent Form Signed Patient voided / foley secured and emptied When did you last eato breakfast Last dose of injectable or oral agent n/a Ostomy pouch emptied and vented if applicable NA All implantable devices assessed, documented and  approved NA Intravenous access site secured and place NA Valuables secured Linens and cotton and cotton/polyester blend (less than 51% polyester) Personal oil-based products / skin lotions / body lotions removed Wigs or hairpieces removed NA Smoking or tobacco  materials removed NA Books / newspapers / magazines / loose paper removed Cologne, aftershave, perfume and deodorant removed Jewelry removed (may wrap wedding band) Make-up removed NA Hair care products removed Battery operated devices (external) removed Heating patches and chemical warmers removed Titanium eyewear removed Nail polish cured greater than 10 hours NA Casting material cured greater than 10 hours NA Hearing aids removed at home Loose dentures or partials removed dentures removed Prosthetics have been removed NA Patient demonstrates correct use of air break device (if applicable) Patient concerns have been addressed Patient grounding bracelet on and cord attached to chamber Specifics for Inpatients (complete in addition to above) Medication sheet sent with patient NA Intravenous medications needed or due during therapy sent with patient NA Drainage tubes (e.g. nasogastric tube or chest tube secured and vented) NA Endotracheal or Tracheotomy tube secured NA Cuff deflated of air and inflated with saline NA Airway suctioned NA Notes Paper version used prior to treatment start. Electronic Signature(s) Signed: 12/24/2022 1:31:43 PM By: [name] CHT EMT BS , , Entered By: [name] on 12/24/2022 13:31:42

## 2022-12-24 NOTE — Progress Notes (Signed)
CLEARANCE, CHRISTE ([id number]) [id number] Page 1 of 2 Visit Report for 12/24/2022 Arrival Information Details Patient Name: Date of Service: SNEH, LIGOCKI 12/24/2022 7:20 A M Medical Record Number: [id number] Patient Account Number: [id number] Date of Birth/Sex: Treating RN: Aug 27, 1940 (82 y.o. [name], [name] Primary Care Keondre Markson: Charissa Bash Other Clinician: Haywood Pao Referring Neev Mcmains: Treating Alyze Lauf/Extender: Claris Gower in Treatment: 6 Visit Information History Since Last Visit All ordered tests and consults were completed: Yes Patient Arrived: Ambulatory Added or deleted any medications: No Arrival Time: 09:40 Any new allergies or adverse reactions: No Accompanied By: self Had a fall or experienced change in No Transfer Assistance: None activities of daily living that may affect Patient Identification Verified: Yes risk of falls: Secondary Verification Process Completed: Yes Signs or symptoms of abuse/neglect since last visito No Patient Requires Transmission-Based Precautions: No Hospitalized since last visit: No Patient Has Alerts: No Implantable device outside of the clinic excluding No cellular tissue based products placed in the center since last visit: Pain Present Now: No Electronic Signature(s) Signed: 12/24/2022 1:27:40 PM By: Haywood Pao CHT EMT BS , , Entered By: Haywood Pao on 12/24/2022 13:27:40 -------------------------------------------------------------------------------- Encounter Discharge Information Details Patient Name: Date of Service: [name] 12/24/2022 7:20 A M Medical Record Number: [id number] Patient Account Number: [id number] Date of Birth/Sex: Treating RN: 09-01-1940 (82 y.o. [name] Primary Care Tyisha Cressy: Charissa Bash Other Clinician: Haywood Pao Referring Amahd Morino: Treating Maurice Fotheringham/Extender: Claris Gower in Treatment: 6 Encounter Discharge Information Items Discharge Condition: Stable Ambulatory Status: Ambulatory Discharge Destination: Home Transportation: Private Auto Accompanied By: self Schedule Follow-up Appointment: No Clinical Summary of Care: Electronic Signature(s) Signed: 12/24/2022 1:36:49 PM By: Haywood Pao CHT EMT BS , , Entered By: Haywood Pao on 12/24/2022 13:36:49 [name] ([id number]) [id number] Page 2 of 2 -------------------------------------------------------------------------------- Vitals Details Patient Name: Date of Service: KAYHAN, SOUFFRONT 12/24/2022 7:20 A M Medical Record Number: [id number] Patient Account Number: [id number] Date of Birth/Sex: Treating RN: 1940/08/04 (82 y.o. [name] Primary Care Colletta Spillers: Charissa Bash Other Clinician: Haywood Pao Referring Lyrik Dockstader: Treating Galia Rahm/Extender: Claris Gower in Treatment: 6 Vital Signs Time Taken: 09:46 Temperature (F): 98.4 Height (in): 71 Pulse (bpm): 53 Weight (lbs): 180 Respiratory Rate (breaths/min): 18 Body Mass Index (BMI): 25.1 Blood Pressure (mmHg): 149/90 Reference Range: 80 - 120 mg / dl Electronic Signature(s) Signed: 12/24/2022 1:28:13 PM By: Haywood Pao CHT EMT BS , , Entered By: Haywood Pao on 12/24/2022 13:28:13

## 2022-12-30 ENCOUNTER — Encounter (HOSPITAL_BASED_OUTPATIENT_CLINIC_OR_DEPARTMENT_OTHER): Payer: Medicare HMO | Attending: Internal Medicine | Admitting: Internal Medicine

## 2022-12-30 DIAGNOSIS — I10 Essential (primary) hypertension: Secondary | ICD-10-CM | POA: Insufficient documentation

## 2022-12-30 DIAGNOSIS — N3041 Irradiation cystitis with hematuria: Secondary | ICD-10-CM | POA: Insufficient documentation

## 2022-12-30 DIAGNOSIS — R351 Nocturia: Secondary | ICD-10-CM | POA: Insufficient documentation

## 2022-12-30 DIAGNOSIS — Y842 Radiological procedure and radiotherapy as the cause of abnormal reaction of the patient, or of later complication, without mention of misadventure at the time of the procedure: Secondary | ICD-10-CM | POA: Insufficient documentation

## 2022-12-30 DIAGNOSIS — Z923 Personal history of irradiation: Secondary | ICD-10-CM | POA: Diagnosis not present

## 2022-12-30 DIAGNOSIS — Z8546 Personal history of malignant neoplasm of prostate: Secondary | ICD-10-CM | POA: Diagnosis not present

## 2022-12-30 DIAGNOSIS — L598 Other specified disorders of the skin and subcutaneous tissue related to radiation: Secondary | ICD-10-CM | POA: Diagnosis not present

## 2022-12-31 ENCOUNTER — Encounter: Payer: Self-pay | Admitting: Internal Medicine

## 2022-12-31 ENCOUNTER — Encounter (HOSPITAL_BASED_OUTPATIENT_CLINIC_OR_DEPARTMENT_OTHER): Payer: Medicare HMO | Admitting: Internal Medicine

## 2022-12-31 DIAGNOSIS — N3041 Irradiation cystitis with hematuria: Secondary | ICD-10-CM | POA: Diagnosis not present

## 2022-12-31 DIAGNOSIS — Z923 Personal history of irradiation: Secondary | ICD-10-CM | POA: Diagnosis not present

## 2022-12-31 DIAGNOSIS — Z8546 Personal history of malignant neoplasm of prostate: Secondary | ICD-10-CM | POA: Diagnosis not present

## 2022-12-31 DIAGNOSIS — L598 Other specified disorders of the skin and subcutaneous tissue related to radiation: Secondary | ICD-10-CM | POA: Diagnosis not present

## 2022-12-31 DIAGNOSIS — R351 Nocturia: Secondary | ICD-10-CM | POA: Diagnosis not present

## 2022-12-31 DIAGNOSIS — I10 Essential (primary) hypertension: Secondary | ICD-10-CM | POA: Diagnosis not present

## 2022-12-31 NOTE — Progress Notes (Signed)
CHADRON, DUEL ([id number]) [id number] Page 1 of 1 Visit Report for 12/30/2022 SuperBill Details Patient Name: Date of Service: QUASHUN, BOMER 12/30/2022 Medical Record Number: [id number] Patient Account Number: [id number] Date of Birth/Sex: Treating RN: 11-07-1940 (82 y.o. 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.41 Irradiation cystitis with hematuria R35.1 Nocturia I10 Essential (primary) hypertension Facility Procedures CPT4 Code Description Modifier Quantity [id number] G0277-(Facility Use Only) HBOT full body chamber, , 4 ICD-10 Diagnosis Description N30.41 Irradiation cystitis with hematuria R35.1 Nocturia I10 Essential (primary) hypertension Physician Procedures Quantity CPT4 Code Description Modifier [id number] 99183 - WC PHYS HYPERBARIC OXYGEN THERAPY 1 ICD-10 Diagnosis Description 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

## 2023-01-01 ENCOUNTER — Encounter (HOSPITAL_BASED_OUTPATIENT_CLINIC_OR_DEPARTMENT_OTHER): Payer: Medicare HMO | Admitting: Internal Medicine

## 2023-01-01 DIAGNOSIS — I10 Essential (primary) hypertension: Secondary | ICD-10-CM | POA: Diagnosis not present

## 2023-01-01 DIAGNOSIS — R351 Nocturia: Secondary | ICD-10-CM | POA: Diagnosis not present

## 2023-01-01 DIAGNOSIS — Z8546 Personal history of malignant neoplasm of prostate: Secondary | ICD-10-CM | POA: Diagnosis not present

## 2023-01-01 DIAGNOSIS — N3041 Irradiation cystitis with hematuria: Secondary | ICD-10-CM | POA: Diagnosis not present

## 2023-01-01 DIAGNOSIS — L598 Other specified disorders of the skin and subcutaneous tissue related to radiation: Secondary | ICD-10-CM | POA: Diagnosis not present

## 2023-01-01 DIAGNOSIS — Z923 Personal history of irradiation: Secondary | ICD-10-CM | POA: Diagnosis not present

## 2023-01-01 NOTE — Progress Notes (Addendum)
LAREY, HERTEL ([id number]) [id number] Page 1 of 2 Visit Report for 12/31/2022 HBO Details Patient Name: Date of Service: [name], [name] 12/31/2022 7:20 A M Medical Record Number: [id number] Patient Account Number: [id number] Date of Birth/Sex: Treating RN: [birth year] (82 y.o. [name], Millard.Loa Primary Care Antwon Rochin: Charissa Bash Other Clinician: Haywood Pao Referring Graelyn Bihl: Treating Talaysha Freeberg/Extender: Morene Antu in Treatment: 7 HBO Treatment Course Details Treatment Course Number: 1 Ordering Jandy Brackens: Lenda Kelp T Treatments Ordered: otal 40 HBO Treatment Start Date: 11/11/2022 HBO Indication: Late Effect of Radiation HBO Treatment Details Treatment Number: 33 Patient Type: Outpatient Chamber Type: Monoplace Chamber Serial #: [id number] Treatment Protocol: 2.0 ATA with 90 minutes oxygen, and no air breaks Treatment Details Compression Rate Down: 1.5 psi / minute De-Compression Rate Up: 2.0 psi / minute Air breaks and breathing Decompress Decompress Compress Tx Pressure Begins Reached periods Begins Ends (leave unused spaces blank) Chamber Pressure (ATA 1 2 ------2 1 ) Clock Time (24 hr) 08:16 08:29 - - - - - - 09:59 10:08 Treatment Length: 112 (minutes) Treatment Segments: 4 Vital Signs Capillary Blood Glucose Reference Range: 80 - 120 mg / dl HBO Diabetic Blood Glucose Intervention Range: <131 mg/dl or >308 mg/dl Type: Time Vitals Blood Respiratory Capillary Blood Glucose Pulse Action Pulse: Temperature: Taken: Pressure: Rate: Glucose (mg/dl): Meter #: Oximetry (%) Taken: Pre 08:02 124/65 55 18 97.3 none per protocol Post 10:09 144/85 53 18 97.2 none per protocol Treatment Response Treatment Toleration: Well Treatment Completion Status: Treatment Completed without Adverse Event Treatment Notes Mr. Marksberry arrived with normal vital signs except heart rate of 55 bpm. He denied symptoms  related to bradycardia stating he felt fine. After changing/preparing for treatment. After performing a safety check, patient was placed in the chamber which was compressed with 100% oxygen at a rate of 2 psi/min after confirming normal ear equalization. He tolerated the treatment and subsequent decompression at the rate of 2 psi/min. His post treatment vital signs were within normal range with the same heart rate of 53 bpm denying any symptoms and appearing asymptomatic for symptoms of poor circulation due to bradycardia. He was stable upon discharge. Anil Havard Notes no concerns with HBO rx Physician HBO Attestation: I certify that I supervised this HBO treatment in accordance with Medicare guidelines. A trained emergency response team is readily available per Yes hospital policies and procedures. Continue HBOT as ordered. [address] KAZEEM, ZAMBITO ([id number]) [id number] Page 2 of 2 Signed: 01/05/2023 9:47:40 AM By: Baltazar Najjar MD Previous Signature: 01/01/2023 10:58:37 AM Version By: Haywood Pao CHT EMT BS , , Entered By: Baltazar Najjar on 01/05/2023 09:39:59 -------------------------------------------------------------------------------- HBO Safety Checklist Details Patient Name: Date of Service: [name] 12/31/2022 7:20 A M Medical Record Number: [id number] Patient Account Number: [id number] Date of Birth/Sex: Treating RN: 07-01-1940 (82 y.o. [name], Millard.Loa Primary Care Jaekwon Mcclune: Charissa Bash Other Clinician: Haywood Pao Referring Yavonne Kiss: Treating Duard Spiewak/Extender: Morene Antu in Treatment: 7 HBO Safety Checklist Items Safety Checklist Consent Form Signed Patient voided / foley secured and emptied When did you last eato 0700 - Egg, [place] Sausage Last dose of injectable or oral agent n/a Ostomy pouch emptied and vented if applicable NA All implantable devices assessed,  documented and approved NA Intravenous access site secured and place NA Valuables secured Linens and cotton and cotton/polyester blend (less than 51% polyester) Personal oil-based products / skin lotions / body lotions removed Wigs or hairpieces removed NA Smoking or tobacco  materials removed NA Books / newspapers / magazines / loose paper removed Cologne, aftershave, perfume and deodorant removed Jewelry removed (may wrap wedding band) Make-up removed Hair care products removed Battery operated devices (external) removed Heating patches and chemical warmers removed Titanium eyewear removed Nail polish cured greater than 10 hours NA Casting material cured greater than 10 hours NA Hearing aids removed NA Loose dentures or partials removed dentures removed Prosthetics have been removed NA Patient demonstrates correct use of air break device (if applicable) Patient concerns have been addressed Patient grounding bracelet on and cord attached to chamber Specifics for Inpatients (complete in addition to above) Medication sheet sent with patient NA Intravenous medications needed or due during therapy sent with patient NA Drainage tubes (e.g. nasogastric tube or chest tube secured and vented) NA Endotracheal or Tracheotomy tube secured NA Cuff deflated of air and inflated with saline NA Airway suctioned NA Notes Paper version used prior to treatment start. Electronic Signature(s) Signed: 01/01/2023 10:54:17 AM By: Haywood Pao CHT EMT BS , , Entered By: Haywood Pao on 01/01/2023 10:54:17

## 2023-01-01 NOTE — Progress Notes (Signed)
ALVIS, LUBA ([id number]) [id number] Page 1 of 2 Visit Report for 12/31/2022 Arrival Information Details Patient Name: Date of Service: [name] 12/31/2022 7:20 A M Medical Record Number: [id number] Patient Account Number: [id number] Date of Birth/Sex: Treating RN: [birth year] (82 y.o. [name], [name] Primary Care Shaylea Ucci: Charissa Bash Other Clinician: Haywood Pao Referring Spero Gunnels: Treating Nyellie Yetter/Extender: Morene Antu in Treatment: 7 Visit Information History Since Last Visit All ordered tests and consults were completed: Yes Patient Arrived: Ambulatory Added or deleted any medications: No Arrival Time: 07:32 Any new allergies or adverse reactions: No Accompanied By: self Had a fall or experienced change in No Transfer Assistance: None activities of daily living that may affect Patient Identification Verified: Yes risk of falls: Secondary Verification Process Completed: Yes Signs or symptoms of abuse/neglect since last visito No Patient Requires Transmission-Based Precautions: No Hospitalized since last visit: No Patient Has Alerts: No Implantable device outside of the clinic excluding No cellular tissue based products placed in the center since last visit: Pain Present Now: No Electronic Signature(s) Signed: 01/01/2023 10:51:57 AM By: Haywood Pao CHT EMT BS , , Entered By: Haywood Pao on 01/01/2023 10:51:57 -------------------------------------------------------------------------------- Encounter Discharge Information Details Patient Name: Date of Service: [name]. 12/31/2022 7:20 A M Medical Record Number: [id number] Patient Account Number: [id number] Date of Birth/Sex: Treating RN: 1940-02-26 (82 y.o. [name] Primary Care Greenly Rarick: Charissa Bash Other Clinician: Haywood Pao Referring Claris Guymon: Treating Diannia Hogenson/Extender: Morene Antu in Treatment: 7 Encounter Discharge Information Items Discharge Condition: Stable Ambulatory Status: Ambulatory Discharge Destination: Home Transportation: Private Auto Accompanied By: self Schedule Follow-up Appointment: No Clinical Summary of Care: Electronic Signature(s) Signed: 01/01/2023 10:59:42 AM By: Haywood Pao CHT EMT BS , , Entered By: Haywood Pao on 01/01/2023 10:59:41 [name] ([id number]) [id number] Page 2 of 2 -------------------------------------------------------------------------------- Vitals Details Patient Name: Date of Service: [name], [name] 12/31/2022 7:20 A M Medical Record Number: [id number] Patient Account Number: [id number] Date of Birth/Sex: Treating RN: January 11, 1941 (82 y.o. [name], [name] Primary Care Morgyn Marut: Charissa Bash Other Clinician: Haywood Pao Referring Keltie Labell: Treating Cillian Gwinner/Extender: Morene Antu in Treatment: 7 Vital Signs Time Taken: 08:02 Temperature (F): 97.3 Height (in): 71 Pulse (bpm): 55 Weight (lbs): 180 Respiratory Rate (breaths/min): 18 Body Mass Index (BMI): 25.1 Blood Pressure (mmHg): 124/65 Reference Range: 80 - 120 mg / dl Electronic Signature(s) Signed: 01/01/2023 10:52:22 AM By: Haywood Pao CHT EMT BS , , Entered By: Haywood Pao on 01/01/2023 10:52:21

## 2023-01-02 ENCOUNTER — Encounter (HOSPITAL_BASED_OUTPATIENT_CLINIC_OR_DEPARTMENT_OTHER): Payer: Medicare HMO | Admitting: Internal Medicine

## 2023-01-02 DIAGNOSIS — N3041 Irradiation cystitis with hematuria: Secondary | ICD-10-CM | POA: Diagnosis not present

## 2023-01-02 DIAGNOSIS — Z923 Personal history of irradiation: Secondary | ICD-10-CM | POA: Diagnosis not present

## 2023-01-02 DIAGNOSIS — R351 Nocturia: Secondary | ICD-10-CM | POA: Diagnosis not present

## 2023-01-02 DIAGNOSIS — I10 Essential (primary) hypertension: Secondary | ICD-10-CM | POA: Diagnosis not present

## 2023-01-02 DIAGNOSIS — Z8546 Personal history of malignant neoplasm of prostate: Secondary | ICD-10-CM | POA: Diagnosis not present

## 2023-01-02 DIAGNOSIS — L598 Other specified disorders of the skin and subcutaneous tissue related to radiation: Secondary | ICD-10-CM | POA: Diagnosis not present

## 2023-01-02 NOTE — Progress Notes (Addendum)
[name], [name] ([id number]) [id number] Page 1 of 2 Visit Report for 01/01/2023 HBO Details Patient Name: Date of Service: [name], [name] 01/01/2023 7:20 A M Medical Record Number: [id number] Patient Account Number: [id number] Date of Birth/Sex: Treating RN: 17-Aug-1940 (82 y.o. [name] Primary Care Zannah Melucci: Charissa Bash Other Clinician: Haywood Pao Referring Ihor Meinzer: Treating Lakasha Mcfall/Extender: Morene Antu in Treatment: 8 HBO Treatment Course Details Treatment Course Number: 1 Ordering Nael Petrosyan: Lenda Kelp T Treatments Ordered: otal 40 HBO Treatment Start Date: 11/11/2022 HBO Indication: Late Effect of Radiation HBO Treatment Details Treatment Number: 34 Patient Type: Outpatient Chamber Type: Monoplace Chamber Serial #: [id number] Treatment Protocol: 2.0 ATA with 90 minutes oxygen, and no air breaks Treatment Details Compression Rate Down: 1.5 psi / minute De-Compression Rate Up: 2.0 psi / minute Air breaks and breathing Decompress Decompress Compress Tx Pressure Begins Reached periods Begins Ends (leave unused spaces blank) Chamber Pressure (ATA 1 2 ------2 1 ) Clock Time (24 hr) 08:04 08:14 - - - - - - 09:44 09:54 Treatment Length: 110 (minutes) Treatment Segments: 4 Vital Signs Capillary Blood Glucose Reference Range: 80 - 120 mg / dl HBO Diabetic Blood Glucose Intervention Range: <131 mg/dl or >027 mg/dl Type: Time Vitals Blood Respiratory Capillary Blood Glucose Pulse Action Pulse: Temperature: Taken: Pressure: Rate: Glucose (mg/dl): Meter #: Oximetry (%) Taken: Pre 07:41 125/71 59 18 97.2 none per protocol Post 09:57 144/73 50 18 97.2 Treatment Response Treatment Completion Status: Treatment Completed without Adverse Event Treatment Notes Mr. Demarce arrived with normal vital signs except heart rate of 59 bpm. He denied symptoms related to bradycardia stating he felt fine.  Patient prepared for treatment. After performing a safety check, patient was placed in the chamber which was compressed with 100% oxygen at a rate of 2 psi/min after confirming normal ear equalization. He tolerated the treatment and subsequent decompression at the rate of 2 psi/min. His post treatment vital signs were within normal range with heart rate of 50 bpm denying any symptoms and appearing asymptomatic for symptoms of poor circulation due to bradycardia. He was stable upon discharge. Bellah Alia Notes No concerns with treatment given Physician HBO Attestation: I certify that I supervised this HBO treatment in accordance with Medicare guidelines. A trained emergency response team is readily available per Yes hospital policies and procedures. Continue HBOT as ordered. Yes Electronic Signature(s) Signed: 01/02/2023 4:45:28 AM By: Baltazar Najjar MD Slaydon, LACIE12/05/2022 4:45:28 AM By: Baltazar Najjar MD SignedKathie Rhodes ([id number]) [id number] Page 2 of 2 Previous Signature: 01/01/2023 4:45:35 PM Version By: Haywood Pao CHT EMT BS , , Entered By: Baltazar Najjar on 01/01/2023 16:49:06 -------------------------------------------------------------------------------- HBO Safety Checklist Details Patient Name: Date of Service: [name], [name] 01/01/2023 7:20 A M Medical Record Number: [id number] Patient Account Number: [id number] Date of Birth/Sex: Treating RN: Nov 27, 1940 (82 y.o. [name], [name] Primary Care Marq Rebello: Charissa Bash Other Clinician: Haywood Pao Referring Deaisha Welborn: Treating Shahrukh Pasch/Extender: Morene Antu in Treatment: 8 HBO Safety Checklist Items Safety Checklist Consent Form Signed Patient voided / foley secured and emptied When did you last eato 0650-[employer] Last dose of injectable or oral agent Ostomy pouch emptied and vented if applicable NA All implantable devices assessed, documented and  approved NA Intravenous access site secured and place NA Valuables secured Linens and cotton and cotton/polyester blend (less than 51% polyester) Personal oil-based products / skin lotions / body lotions removed Wigs or hairpieces removed NA Smoking or tobacco materials removed NA Books /  newspapers / magazines / loose paper removed Cologne, aftershave, perfume and deodorant removed Jewelry removed (may wrap wedding band) Make-up removed NA Hair care products removed Battery operated devices (external) removed Heating patches and chemical warmers removed Titanium eyewear removed Nail polish cured greater than 10 hours NA Casting material cured greater than 10 hours NA Hearing aids removed NA Loose dentures or partials removed NA Prosthetics have been removed NA Patient demonstrates correct use of air break device (if applicable) Patient concerns have been addressed Patient grounding bracelet on and cord attached to chamber Specifics for Inpatients (complete in addition to above) Medication sheet sent with patient NA Intravenous medications needed or due during therapy sent with patient NA Drainage tubes (e.g. nasogastric tube or chest tube secured and vented) NA Endotracheal or Tracheotomy tube secured NA Cuff deflated of air and inflated with saline NA Airway suctioned NA Notes Paper version used prior to treatment start. Electronic Signature(s) Signed: 01/01/2023 4:41:14 PM By: Haywood Pao CHT EMT BS , , Entered By: Haywood Pao on 01/01/2023 16:41:14

## 2023-01-02 NOTE — Progress Notes (Signed)
ZEYAD, MARRA ([id number]) [id number] Page 1 of 1 Visit Report for 01/01/2023 SuperBill Details Patient Name: Date of Service: CHRISTIAAN, JELLE 01/01/2023 Medical Record Number: [id number] Patient Account Number: [id number] Date of Birth/Sex: Treating RN: 1940/08/30 (82 y.o. 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

## 2023-01-02 NOTE — Progress Notes (Signed)
FELIS, HOGANSON ([id number]) [id number] Page 1 of 2 Visit Report for 01/01/2023 Arrival Information Details Patient Name: Date of Service: [name], [name] 01/01/2023 7:20 A M Medical Record Number: [id number] Patient Account Number: [id number] Date of Birth/Sex: Treating RN: 05-29-1940 (82 y.o. [name], [name] Primary Care Akaya Proffit: Charissa Bash Other Clinician: Haywood Pao Referring Jose Corvin: Treating Ramata Strothman/Extender: Morene Antu in Treatment: 8 Visit Information History Since Last Visit All ordered tests and consults were completed: Yes Patient Arrived: Ambulatory Added or deleted any medications: No Arrival Time: 07:39 Any new allergies or adverse reactions: No Accompanied By: self Had a fall or experienced change in No Transfer Assistance: None activities of daily living that may affect Patient Identification Verified: Yes risk of falls: Secondary Verification Process Completed: Yes Signs or symptoms of abuse/neglect since last visito No Patient Requires Transmission-Based Precautions: No Hospitalized since last visit: No Patient Has Alerts: No Implantable device outside of the clinic excluding No cellular tissue based products placed in the center since last visit: Pain Present Now: No Electronic Signature(s) Signed: 01/01/2023 4:38:46 PM By: Haywood Pao CHT EMT BS , , Entered By: Haywood Pao on 01/01/2023 16:38:46 -------------------------------------------------------------------------------- Encounter Discharge Information Details Patient Name: Date of Service: [name] Riffle. 01/01/2023 7:20 A M Medical Record Number: [id number] Patient Account Number: [id number] Date of Birth/Sex: Treating RN: 01/04/1941 (82 y.o. [name] Primary Care Samona Chihuahua: Charissa Bash Other Clinician: Haywood Pao Referring Sinai Mahany: Treating Alekai Pocock/Extender: Morene Antu in Treatment: 8 Encounter Discharge Information Items Discharge Condition: Stable Ambulatory Status: Ambulatory Discharge Destination: Home Transportation: Ambulance Accompanied By: self Schedule Follow-up Appointment: No Clinical Summary of Care: Electronic Signature(s) Signed: 01/01/2023 4:51:13 PM By: Haywood Pao CHT EMT BS , , Entered By: Haywood Pao on 01/01/2023 16:51:13 [name] ([id number]) [id number].pdf Page 2 of 2 -------------------------------------------------------------------------------- Vitals Details Patient Name: Date of Service: [name], [name] 01/01/2023 7:20 A M Medical Record Number: [id number] Patient Account Number: [id number] Date of Birth/Sex: Treating RN: [birth year] (82 y.o. [name] Primary Care Dorota Heinrichs: Charissa Bash Other Clinician: Haywood Pao Referring Maekayla Giorgio: Treating Chloee Tena/Extender: Morene Antu in Treatment: 8 Vital Signs Time Taken: 07:41 Temperature (F): 97.2 Height (in): 71 Pulse (bpm): 59 Weight (lbs): 180 Respiratory Rate (breaths/min): 18 Body Mass Index (BMI): 25.1 Blood Pressure (mmHg): 125/71 Reference Range: 80 - 120 mg / dl Electronic Signature(s) Signed: 01/01/2023 4:39:11 PM By: Haywood Pao CHT EMT BS , , Entered By: Haywood Pao on 01/01/2023 16:39:10

## 2023-01-03 NOTE — Progress Notes (Signed)
[name], [name] ([id number]) [id number] Page 1 of 2 Visit Report for 12/23/2022 Arrival Information Details Patient Name: Date of Service: [name], [name] 12/23/2022 7:20 A M Medical Record Number: [id number] Patient Account Number: [id number] Date of Birth/Sex: Treating RN: 24-Jan-1941 (82 y.o. [name], Millard.Loa Primary Care Tiegan Terpstra: Charissa Bash Other Clinician: Daryll Brod Referring Richey Doolittle: Treating Shaniqwa Horsman/Extender: Claris Gower in Treatment: 6 Visit Information History Since Last Visit Added or deleted any medications: No Patient Arrived: Ambulatory Any new allergies or adverse reactions: No Arrival Time: 07:45 Had a fall or experienced change in No Accompanied By: self activities of daily living that may affect Transfer Assistance: None risk of falls: Patient Identification Verified: Yes Signs or symptoms of abuse/neglect since last visito No Secondary Verification Process Completed: Yes Hospitalized since last visit: No Patient Requires Transmission-Based Precautions: No Implantable device outside of the clinic excluding No Patient Has Alerts: No cellular tissue based products placed in the center since last visit: Pain Present Now: No Electronic Signature(s) Signed: 12/23/2022 11:17:30 AM By: Haywood Pao CHT EMT BS , , Entered By: Haywood Pao on 12/23/2022 08:17:30 -------------------------------------------------------------------------------- Encounter Discharge Information Details Patient Name: Date of Service: [name] Riffle. 12/23/2022 7:20 A M Medical Record Number: [id number] Patient Account Number: [id number] Date of Birth/Sex: Treating RN: July 26, 1940 (82 y.o. [name], Millard.Loa Primary Care Nafeesa Dils: Charissa Bash Other Clinician: Daryll Brod Referring Daniya Aramburo: Treating Bereket Gernert/Extender: Claris Gower in Treatment: 6 Encounter Discharge  Information Items Discharge Condition: Stable Ambulatory Status: Ambulatory Discharge Destination: Home Transportation: Private Auto Accompanied By: self Schedule Follow-up Appointment: Yes Clinical Summary of Care: Electronic Signature(s) Signed: 01/02/2023 3:18:21 PM By: Demetria Pore Entered By: Demetria Pore on 12/23/2022 07:39:59 [name] ([id number]) [id number] Page 2 of 2 -------------------------------------------------------------------------------- Vitals Details Patient Name: Date of Service: [name], [name] 12/23/2022 7:20 A M Medical Record Number: [id number] Patient Account Number: [id number] Date of Birth/Sex: Treating RN: 1940/11/14 (82 y.o. [name], Millard.Loa Primary Care Adalid Beckmann: Charissa Bash Other Clinician: Daryll Brod Referring Eaven Schwager: Treating Krew Hortman/Extender: Claris Gower in Treatment: 6 Vital Signs Time Taken: 07:50 Temperature (F): 98.0 Height (in): 71 Pulse (bpm): 88 Weight (lbs): 180 Respiratory Rate (breaths/min): 16 Body Mass Index (BMI): 25.1 Blood Pressure (mmHg): 127/89 Reference Range: 80 - 120 mg / dl Electronic Signature(s) Signed: 12/23/2022 11:17:38 AM By: Haywood Pao CHT EMT BS , , Entered By: Haywood Pao on 12/23/2022 08:17:38

## 2023-01-03 NOTE — Progress Notes (Signed)
CRANDALL, ARIANO ([id number]) [id number] Page 1 of 1 Visit Report for 12/23/2022 SuperBill Details Patient Name: Date of Service: BRENLEY, MARTINSEN 12/23/2022 Medical Record Number: [id number] Patient Account Number: [id number] Date of Birth/Sex: Treating RN: 06-28-1940 (82 y.o. [name] Primary Care Provider: Charissa Bash Other Clinician: Daryll Brod Referring Provider: Treating Provider/Extender: Claris Gower in Treatment: 6 Diagnosis Coding ICD-10 Codes Code Description N30.41 Irradiation cystitis with hematuria R35.1 Nocturia I10 Essential (primary) hypertension Facility Procedures CPT4 Code Description Modifier Quantity 95284132 G0277-(Facility Use Only) HBOT full body chamber, , 4 Physician Procedures Quantity CPT4 Code Description Modifier 4401027 99183 - WC PHYS HYPERBARIC OXYGEN THERAPY 1 ICD-10 Diagnosis Description N30.41 Irradiation cystitis with hematuria Electronic Signature(s) Signed: 12/23/2022 12:19:03 PM By: Duanne Guess MD FACS Signed: 01/02/2023 3:18:21 PM By: Demetria Pore Entered By: Demetria Pore on 12/23/2022 07:39:29

## 2023-01-05 NOTE — Progress Notes (Signed)
SYERE, GOLDNER ([id number]) [id number] Page 1 of 1 Visit Report for 12/31/2022 SuperBill Details Patient Name: Date of Service: [name], [name] 12/31/2022 Medical Record Number: [id number] Patient Account Number: [id number] Date of Birth/Sex: Treating RN: 05-10-1940 (82 y.o. 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

## 2023-01-06 ENCOUNTER — Encounter (HOSPITAL_BASED_OUTPATIENT_CLINIC_OR_DEPARTMENT_OTHER): Payer: Medicare HMO | Admitting: Internal Medicine

## 2023-01-06 DIAGNOSIS — N3041 Irradiation cystitis with hematuria: Secondary | ICD-10-CM | POA: Diagnosis not present

## 2023-01-06 DIAGNOSIS — R351 Nocturia: Secondary | ICD-10-CM | POA: Diagnosis not present

## 2023-01-06 DIAGNOSIS — Z923 Personal history of irradiation: Secondary | ICD-10-CM | POA: Diagnosis not present

## 2023-01-06 DIAGNOSIS — I10 Essential (primary) hypertension: Secondary | ICD-10-CM | POA: Diagnosis not present

## 2023-01-06 DIAGNOSIS — Z8546 Personal history of malignant neoplasm of prostate: Secondary | ICD-10-CM | POA: Diagnosis not present

## 2023-01-06 DIAGNOSIS — L598 Other specified disorders of the skin and subcutaneous tissue related to radiation: Secondary | ICD-10-CM | POA: Diagnosis not present

## 2023-01-06 NOTE — Progress Notes (Signed)
SULTAN, KETTNER ([id number]) [id number] Page 1 of 1 Visit Report for 01/06/2023 SuperBill Details Patient Name: Date of Service: [name], [name] 01/06/2023 Medical Record Number: [id number] Patient Account Number: [id number] Date of Birth/Sex: Treating RN: [birth year] (82 y.o. 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

## 2023-01-06 NOTE — Progress Notes (Addendum)
TOYE, PAINTER ([id number]) [id number] Page 1 of 2 Visit Report for 01/06/2023 HBO Details Patient Name: Date of Service: LEMOINE, [name] 01/06/2023 7:20 A M Medical Record Number: [id number] Patient Account Number: [id number] Date of Birth/Sex: Treating RN: [birth year] (82 y.o. [name] Primary Care Brownie Gockel: Charissa Bash Other Clinician: Haywood Pao Referring Alexsandro Salek: Treating Duan Scharnhorst/Extender: Morene Antu in Treatment: 8 HBO Treatment Course Details Treatment Course Number: 1 Ordering Nicolae Vasek: Lenda Kelp T Treatments Ordered: otal 40 HBO Treatment Start Date: 11/11/2022 HBO Indication: Late Effect of Radiation HBO Treatment Details Treatment Number: 36 Patient Type: Outpatient Chamber Type: Monoplace Chamber Serial #: [id number] Treatment Protocol: 2.0 ATA with 90 minutes oxygen, and no air breaks Treatment Details Compression Rate Down: 1.5 psi / minute De-Compression Rate Up: 2.0 psi / minute Air breaks and breathing Decompress Decompress Compress Tx Pressure Begins Reached periods Begins Ends (leave unused spaces blank) Chamber Pressure (ATA 1 2 ------2 1 ) Clock Time (24 hr) 08:00 08:11 - - - - - - 08:41 09:51 Treatment Length: 111 (minutes) Treatment Segments: 4 Vital Signs Capillary Blood Glucose Reference Range: 80 - 120 mg / dl HBO Diabetic Blood Glucose Intervention Range: <131 mg/dl or >244 mg/dl Type: Time Vitals Blood Respiratory Capillary Blood Glucose Pulse Action Pulse: Temperature: Taken: Pressure: Rate: Glucose (mg/dl): Meter #: Oximetry (%) Taken: Pre 07:34 148/70 57 18 98.2 none per protocol Post 09:54 149/79 54 18 97.2 none per protocol Treatment Response Treatment Toleration: Well Treatment Completion Status: Treatment Completed without Adverse Event Treatment Notes Mr. Harben arrived with normal vital signs except heart rate of 57 bpm. He denied symptoms  related to bradycardia stating he felt fine. Patient prepared for treatment. After performing a safety check, patient was placed in the chamber which was compressed with 100% oxygen at a rate of 2 psi/min after confirming normal ear equalization. He tolerated the treatment and subsequent decompression at the rate of 2 psi/min. His post treatment vital signs were within normal range with heart rate of 54 bpm denying any symptoms and appearing asymptomatic for symptoms of poor circulation due to bradycardia. He was stable upon discharge. Electronic Signature(s) Signed: 01/06/2023 1:31:47 PM By: Haywood Pao CHT EMT BS , , Signed: 01/06/2023 4:43:54 PM By: Baltazar Najjar MD Entered By: Haywood Pao on 01/06/2023 13:31:47 [name] ([id number]) [id number] Page 2 of 2 -------------------------------------------------------------------------------- HBO Safety Checklist Details Patient Name: Date of Service: [name], [name] 01/06/2023 7:20 A M Medical Record Number: [id number] Patient Account Number: [id number] Date of Birth/Sex: Treating RN: September 01, 1940 (82 y.o. [name] Primary Care Johnell Landowski: Charissa Bash Other Clinician: Haywood Pao Referring Xan Ingraham: Treating Gearl Baratta/Extender: Morene Antu in Treatment: 8 HBO Safety Checklist Items Safety Checklist Consent Form Signed Patient voided / foley secured and emptied When did you last eato 0650 Last dose of injectable or oral agent n/a Ostomy pouch emptied and vented if applicable NA All implantable devices assessed, documented and approved NA Intravenous access site secured and place NA Valuables secured Linens and cotton and cotton/polyester blend (less than 51% polyester) Personal oil-based products / skin lotions / body lotions removed Wigs or hairpieces removed NA Smoking or tobacco materials removed NA Books / newspapers / magazines / loose paper  removed Cologne, aftershave, perfume and deodorant removed Jewelry removed (may wrap wedding band) Make-up removed NA Hair care products removed Battery operated devices (external) removed Heating patches and chemical warmers removed Titanium eyewear removed Nail polish cured greater than  10 hours NA Casting material cured greater than 10 hours NA Hearing aids removed NA Loose dentures or partials removed dentures removed Prosthetics have been removed NA Patient demonstrates correct use of air break device (if applicable) Patient concerns have been addressed Patient grounding bracelet on and cord attached to chamber Specifics for Inpatients (complete in addition to above) Medication sheet sent with patient NA Intravenous medications needed or due during therapy sent with patient NA Drainage tubes (e.g. nasogastric tube or chest tube secured and vented) NA Endotracheal or Tracheotomy tube secured NA Cuff deflated of air and inflated with saline NA Airway suctioned NA Notes Paper version used prior to treatment start. Electronic Signature(s) Signed: 01/06/2023 1:29:43 PM By: Haywood Pao CHT EMT BS , , Entered By: Haywood Pao on 01/06/2023 13:29:43

## 2023-01-06 NOTE — Progress Notes (Signed)
[name], [name] ([id number]) [id number] Page 1 of 2 Visit Report for 01/06/2023 Arrival Information Details Patient Name: Date of Service: [name], [name] 01/06/2023 7:20 A M Medical Record Number: [id number] Patient Account Number: [id number] Date of Birth/Sex: Treating RN: 03-13-1940 (82 y.o. [name], [name] Primary Care [name]: [name] Other Clinician: [name] Referring Zachry Hopfensperger: Treating Melessia Kaus/Extender: Morene Antu in Treatment: 8 Visit Information History Since Last Visit All ordered tests and consults were completed: Yes Patient Arrived: Ambulatory Added or deleted any medications: No Arrival Time: 07:31 Any new allergies or adverse reactions: No Accompanied By: self Had a fall or experienced change in No Transfer Assistance: None activities of daily living that may affect Patient Identification Verified: Yes risk of falls: Secondary Verification Process Completed: Yes Signs or symptoms of abuse/neglect since last visito No Patient Requires Transmission-Based Precautions: No Hospitalized since last visit: No Patient Has Alerts: No Implantable device outside of the clinic excluding No cellular tissue based products placed in the center since last visit: Pain Present Now: No Electronic Signature(s) Signed: 01/06/2023 1:08:22 PM By: [name] CHT EMT BS , , Entered By: [name] on 01/06/2023 13:08:21 -------------------------------------------------------------------------------- Encounter Discharge Information Details Patient Name: Date of Service: [name]. 01/06/2023 7:20 A M Medical Record Number: [id number] Patient Account Number: [id number] Date of Birth/Sex: Treating RN: 07/31/1940 (82 y.o. [name] Primary Care [name]: [name] Other Clinician: [name] Referring Cammeron Greis: Treating Ashawna Hanback/Extender: Morene Antu in Treatment: 8 Encounter Discharge Information Items Discharge Condition: Stable Ambulatory Status: Ambulatory Discharge Destination: Home Transportation: Private Auto Accompanied By: self Schedule Follow-up Appointment: No Clinical Summary of Care: Electronic Signature(s) Signed: 01/06/2023 1:32:33 PM By: [name] CHT EMT BS , , Entered By: [name] on 01/06/2023 13:32:33 [name] ([id number]) [id number] Page 2 of 2 -------------------------------------------------------------------------------- Vitals Details Patient Name: Date of Service: [name], [name] 01/06/2023 7:20 A M Medical Record Number: [id number] Patient Account Number: [id number] Date of Birth/Sex: Treating RN: [birth year] (82 y.o. [name] Primary Care Danene Montijo: [name] Other Clinician: [name] Referring Gottlieb Zuercher: Treating Stepehn Eckard/Extender: Morene Antu in Treatment: 8 Vital Signs Time Taken: 07:34 Temperature (F): 98.2 Height (in): 71 Pulse (bpm): 57 Weight (lbs): 180 Respiratory Rate (breaths/min): 18 Body Mass Index (BMI): 25.1 Blood Pressure (mmHg): 148/70 Reference Range: 80 - 120 mg / dl Electronic Signature(s) Signed: 01/06/2023 1:23:26 PM By: [name] CHT EMT BS , , Entered By: [name] on 01/06/2023 13:23:26

## 2023-01-07 ENCOUNTER — Encounter (HOSPITAL_BASED_OUTPATIENT_CLINIC_OR_DEPARTMENT_OTHER): Payer: Medicare HMO | Admitting: Internal Medicine

## 2023-01-07 DIAGNOSIS — N3041 Irradiation cystitis with hematuria: Secondary | ICD-10-CM | POA: Diagnosis not present

## 2023-01-07 DIAGNOSIS — R351 Nocturia: Secondary | ICD-10-CM | POA: Diagnosis not present

## 2023-01-07 DIAGNOSIS — I10 Essential (primary) hypertension: Secondary | ICD-10-CM | POA: Diagnosis not present

## 2023-01-07 DIAGNOSIS — Z923 Personal history of irradiation: Secondary | ICD-10-CM | POA: Diagnosis not present

## 2023-01-07 DIAGNOSIS — L598 Other specified disorders of the skin and subcutaneous tissue related to radiation: Secondary | ICD-10-CM | POA: Diagnosis not present

## 2023-01-07 DIAGNOSIS — Z8546 Personal history of malignant neoplasm of prostate: Secondary | ICD-10-CM | POA: Diagnosis not present

## 2023-01-07 NOTE — Progress Notes (Signed)
ULUS, POTTORF ([id number]) [id number] Page 1 of 2 Visit Report for 01/07/2023 HBO Details Patient Name: Date of Service: [name], [name] 01/07/2023 7:20 A M Medical Record Number: [id number] Patient Account Number: [id number] Date of Birth/Sex: Treating RN: Dec 09, 1940 (82 y.o. [name], Millard.Loa Primary Care Timoteo Carreiro: Charissa Bash Other Clinician: Haywood Pao Referring Odyn Turko: Treating Rucha Wissinger/Extender: Morene Antu in Treatment: 8 HBO Treatment Course Details Treatment Course Number: 1 Ordering Eiliyah Reh: Lenda Kelp T Treatments Ordered: otal 40 HBO Treatment Start Date: 11/11/2022 HBO Indication: Late Effect of Radiation HBO Treatment Details Treatment Number: 37 Patient Type: Outpatient Chamber Type: Monoplace Chamber Serial #: [id number] Treatment Protocol: 2.0 ATA with 90 minutes oxygen, and no air breaks Treatment Details Compression Rate Down: 1.5 psi / minute De-Compression Rate Up: 2.0 psi / minute Air breaks and breathing Decompress Decompress Compress Tx Pressure Begins Reached periods Begins Ends (leave unused spaces blank) Chamber Pressure (ATA 1 2 ------2 1 ) Clock Time (24 hr) 08:02 08:14 - - - - - - 09:45 09:53 Treatment Length: 111 (minutes) Treatment Segments: 4 Vital Signs Capillary Blood Glucose Reference Range: 80 - 120 mg / dl HBO Diabetic Blood Glucose Intervention Range: <131 mg/dl or >308 mg/dl Type: Time Vitals Blood Respiratory Capillary Blood Glucose Pulse Action Pulse: Temperature: Taken: Pressure: Rate: Glucose (mg/dl): Meter #: Oximetry (%) Taken: Pre 07:59 115/95 61 18 97.3 none per protocol Post 09:56 149/65 50 18 97.3 none per protocol Treatment Response Treatment Toleration: Well Treatment Completion Status: Treatment Completed without Adverse Event Treatment Notes Mr. Balliett arrived with normal vital signs. Patient prepared for treatment. After performing a  safety check, patient was placed in the chamber which was compressed with 100% oxygen at a rate of 2 psi/min after confirming normal ear equalization. He tolerated the treatment and subsequent decompression at the rate of 2 psi/min. His post treatment vital signs included BP of 151/128 mmHg, heart rate of 59 bpm. BP re-taken with result of 149/65 mmHg, heart rate of 50 bpm. Patient denied symptoms related to bradycardia and/or hypertension. He was stable upon discharge. Praveen Coia Notes No concerns with treatment given Physician HBO Attestation: I certify that I supervised this HBO treatment in accordance with Medicare guidelines. A trained emergency response team is readily available per Yes hospital policies and procedures. Continue HBOT as ordered. Yes Electronic Signature(s) Signed: 01/08/2023 10:03:28 AM By: Baltazar Najjar MD Vandyke, LACIE12/11/2022 10:03:28 AM By: Baltazar Najjar MD SignedKathie Rhodes ([id number]) [id number] Page 2 of 2 Previous Signature: 01/07/2023 11:19:00 AM Version By: Haywood Pao CHT EMT BS , , Entered By: Baltazar Najjar on 01/07/2023 13:26:10 -------------------------------------------------------------------------------- HBO Safety Checklist Details Patient Name: Date of Service: [name], [name] 01/07/2023 7:20 A M Medical Record Number: [id number] Patient Account Number: [id number] Date of Birth/Sex: Treating RN: 15-Nov-1940 (82 y.o. [name], Millard.Loa Primary Care Estee Yohe: Charissa Bash Other Clinician: Haywood Pao Referring Azaylea Maves: Treating Katiria Calame/Extender: Morene Antu in Treatment: 8 HBO Safety Checklist Items Safety Checklist Consent Form Signed Patient voided / foley secured and emptied When did you last eato 0700 Last dose of injectable or oral agent n/a Ostomy pouch emptied and vented if applicable NA All implantable devices assessed, documented and approved NA Intravenous access  site secured and place NA Valuables secured Linens and cotton and cotton/polyester blend (less than 51% polyester) Personal oil-based products / skin lotions / body lotions removed Wigs or hairpieces removed NA Smoking or tobacco materials removed NA Books / newspapers / magazines /  loose paper removed Cologne, aftershave, perfume and deodorant removed Jewelry removed (may wrap wedding band) Make-up removed NA Hair care products removed Battery operated devices (external) removed Heating patches and chemical warmers removed Titanium eyewear removed Nail polish cured greater than 10 hours NA Casting material cured greater than 10 hours NA Hearing aids removed NA Loose dentures or partials removed Prosthetics have been removed NA Patient demonstrates correct use of air break device (if applicable) Patient concerns have been addressed Patient grounding bracelet on and cord attached to chamber Specifics for Inpatients (complete in addition to above) Medication sheet sent with patient NA Intravenous medications needed or due during therapy sent with patient NA Drainage tubes (e.g. nasogastric tube or chest tube secured and vented) NA Endotracheal or Tracheotomy tube secured NA Cuff deflated of air and inflated with saline NA Airway suctioned NA Notes Paper version used prior to treatment start. Electronic Signature(s) Signed: 01/07/2023 11:11:54 AM By: Haywood Pao CHT EMT BS , , Entered By: Haywood Pao on 01/07/2023 08:11:54

## 2023-01-07 NOTE — Progress Notes (Signed)
TETSUO, BUROW ([id number]) [id number] Page 1 of 2 Visit Report for 01/07/2023 Arrival Information Details Patient Name: Date of Service: [name], [name] 01/07/2023 7:20 A M Medical Record Number: [id number] Patient Account Number: [id number] Date of Birth/Sex: Treating RN: [birth year] (82 y.o. [name], Millard.Loa Primary Care Rudolfo Brandow: Charissa Bash Other Clinician: Haywood Pao Referring Lilliona Blakeney: Treating Ajaya Crutchfield/Extender: Morene Antu in Treatment: 8 Visit Information History Since Last Visit All ordered tests and consults were completed: Yes Patient Arrived: Ambulatory Added or deleted any medications: No Arrival Time: 07:30 Any new allergies or adverse reactions: No Accompanied By: self Had a fall or experienced change in No Transfer Assistance: None activities of daily living that may affect Patient Identification Verified: Yes risk of falls: Secondary Verification Process Completed: Yes Signs or symptoms of abuse/neglect since last visito No Patient Requires Transmission-Based Precautions: No Hospitalized since last visit: No Patient Has Alerts: No Implantable device outside of the clinic excluding No cellular tissue based products placed in the center since last visit: Pain Present Now: No Electronic Signature(s) Signed: 01/07/2023 11:07:57 AM By: Haywood Pao CHT EMT BS , , Entered By: Haywood Pao on 01/07/2023 11:07:57 -------------------------------------------------------------------------------- Encounter Discharge Information Details Patient Name: Date of Service: [name] Riffle. 01/07/2023 7:20 A M Medical Record Number: [id number] Patient Account Number: [id number] Date of Birth/Sex: Treating RN: August 24, 1940 (82 y.o. [name] Primary Care Demetruis Depaul: Charissa Bash Other Clinician: Haywood Pao Referring Dennisse Swader: Treating Sharlette Jansma/Extender: Morene Antu in Treatment: 8 Encounter Discharge Information Items Discharge Condition: Stable Ambulatory Status: Ambulatory Discharge Destination: Home Transportation: Private Auto Accompanied By: self Schedule Follow-up Appointment: No Clinical Summary of Care: Electronic Signature(s) Signed: 01/07/2023 11:21:24 AM By: Haywood Pao CHT EMT BS , , Entered By: Haywood Pao on 01/07/2023 11:21:23 [name] ([id number]) [id number] Page 2 of 2 -------------------------------------------------------------------------------- Vitals Details Patient Name: Date of Service: [name], [name] 01/07/2023 7:20 A M Medical Record Number: [id number] Patient Account Number: [id number] Date of Birth/Sex: Treating RN: [birth year] (82 y.o. [name], Millard.Loa Primary Care Abbey Veith: Charissa Bash Other Clinician: Haywood Pao Referring Elyon Zoll: Treating Shanena Pellegrino/Extender: Morene Antu in Treatment: 8 Vital Signs Time Taken: 07:59 Temperature (F): 97.3 Height (in): 71 Pulse (bpm): 61 Weight (lbs): 180 Respiratory Rate (breaths/min): 18 Body Mass Index (BMI): 25.1 Blood Pressure (mmHg): 115/95 Reference Range: 80 - 120 mg / dl Electronic Signature(s) Signed: 01/07/2023 11:08:59 AM By: Haywood Pao CHT EMT BS , , Entered By: Haywood Pao on 01/07/2023 11:08:59

## 2023-01-08 ENCOUNTER — Encounter (HOSPITAL_BASED_OUTPATIENT_CLINIC_OR_DEPARTMENT_OTHER): Payer: Medicare HMO | Admitting: Internal Medicine

## 2023-01-08 DIAGNOSIS — Z923 Personal history of irradiation: Secondary | ICD-10-CM | POA: Diagnosis not present

## 2023-01-08 DIAGNOSIS — Z8546 Personal history of malignant neoplasm of prostate: Secondary | ICD-10-CM | POA: Diagnosis not present

## 2023-01-08 DIAGNOSIS — N3041 Irradiation cystitis with hematuria: Secondary | ICD-10-CM | POA: Diagnosis not present

## 2023-01-08 DIAGNOSIS — L598 Other specified disorders of the skin and subcutaneous tissue related to radiation: Secondary | ICD-10-CM | POA: Diagnosis not present

## 2023-01-08 DIAGNOSIS — R351 Nocturia: Secondary | ICD-10-CM | POA: Diagnosis not present

## 2023-01-08 DIAGNOSIS — I10 Essential (primary) hypertension: Secondary | ICD-10-CM | POA: Diagnosis not present

## 2023-01-08 NOTE — Progress Notes (Signed)
BOOTH, JETT ([id number]) [id number] Page 1 of 2 Visit Report for 01/08/2023 Arrival Information Details Patient Name: Date of Service: [name], [name] 01/08/2023 7:20 A M Medical Record Number: [id number] Patient Account Number: [id number] Date of Birth/Sex: Treating RN: [birth year] (82 y.o. [name] Primary Care Gearldine Looney: Charissa Bash Other Clinician: Haywood Pao Referring Keivon Garden: Treating Jazzlin Clements/Extender: Morene Antu in Treatment: 9 Visit Information History Since Last Visit All ordered tests and consults were completed: Yes Patient Arrived: Ambulatory Added or deleted any medications: No Arrival Time: 07:33 Any new allergies or adverse reactions: No Accompanied By: self Had a fall or experienced change in No Transfer Assistance: None activities of daily living that may affect Patient Identification Verified: Yes risk of falls: Secondary Verification Process Completed: Yes Signs or symptoms of abuse/neglect since last visito No Patient Requires Transmission-Based Precautions: No Hospitalized since last visit: No Patient Has Alerts: No Implantable device outside of the clinic excluding No cellular tissue based products placed in the center since last visit: Pain Present Now: No Electronic Signature(s) Signed: 01/08/2023 8:31:01 AM By: Haywood Pao CHT EMT BS , , Entered By: Haywood Pao on 01/08/2023 08:31:01 -------------------------------------------------------------------------------- Encounter Discharge Information Details Patient Name: Date of Service: [name] Riffle. 01/08/2023 7:20 A M Medical Record Number: [id number] Patient Account Number: [id number] Date of Birth/Sex: Treating RN: 03-04-1940 (82 y.o. [name] Primary Care Xzaviar Maloof: Charissa Bash Other Clinician: Haywood Pao Referring Kincade Granberg: Treating Kisa Fujii/Extender: Morene Antu in Treatment: 9 Encounter Discharge Information Items Discharge Condition: Stable Ambulatory Status: Ambulatory Discharge Destination: Home Transportation: Private Auto Accompanied By: self Schedule Follow-up Appointment: No Clinical Summary of Care: Electronic Signature(s) Signed: 01/08/2023 10:42:25 AM By: Haywood Pao CHT EMT BS , , Entered By: Haywood Pao on 01/08/2023 10:42:25 [name] ([id number]) [id number] Page 2 of 2 -------------------------------------------------------------------------------- Vitals Details Patient Name: Date of Service: [name], [name] 01/08/2023 7:20 A M Medical Record Number: [id number] Patient Account Number: [id number] Date of Birth/Sex: Treating RN: 1940/03/26 (82 y.o. [name] Primary Care Jonetta Dagley: Charissa Bash Other Clinician: Haywood Pao Referring Guthrie Lemme: Treating Abagayle Klutts/Extender: Morene Antu in Treatment: 9 Vital Signs Time Taken: 07:47 Temperature (F): 97.3 Height (in): 71 Pulse (bpm): 61 Weight (lbs): 180 Respiratory Rate (breaths/min): 18 Body Mass Index (BMI): 25.1 Blood Pressure (mmHg): 128/72 Reference Range: 80 - 120 mg / dl Electronic Signature(s) Signed: 01/08/2023 8:31:24 AM By: Haywood Pao CHT EMT BS , , Entered By: Haywood Pao on 01/08/2023 08:31:23

## 2023-01-08 NOTE — Progress Notes (Addendum)
ISACK, BAAB ([id number]) [id number] Page 1 of 2 Visit Report for 01/08/2023 HBO Details Patient Name: Date of Service: [name], [name] 01/08/2023 7:20 A M Medical Record Number: [id number] Patient Account Number: [id number] Date of Birth/Sex: Treating RN: 1940/01/29 (82 y.o. [name] Primary Care Katarina Riebe: Charissa Bash Other Clinician: Haywood Pao Referring Conleigh Heinlein: Treating Emanii Bugbee/Extender: Morene Antu in Treatment: 9 HBO Treatment Course Details Treatment Course Number: 1 Ordering Mirren Gest: Lenda Kelp T Treatments Ordered: otal 40 HBO Treatment Start Date: 11/11/2022 HBO Indication: Late Effect of Radiation HBO Treatment Details Treatment Number: 38 Patient Type: Outpatient Chamber Type: Monoplace Chamber Serial #: [id number] Treatment Protocol: 2.0 ATA with 90 minutes oxygen, and no air breaks Treatment Details Compression Rate Down: 1.5 psi / minute De-Compression Rate Up: 2.0 psi / minute Air breaks and breathing Decompress Decompress Compress Tx Pressure Begins Reached periods Begins Ends (leave unused spaces blank) Chamber Pressure (ATA 1 2 ------2 1 ) Clock Time (24 hr) 07:57 08:08 - - - - - - 09:38 09:46 Treatment Length: 109 (minutes) Treatment Segments: 4 Vital Signs Capillary Blood Glucose Reference Range: 80 - 120 mg / dl HBO Diabetic Blood Glucose Intervention Range: <131 mg/dl or >086 mg/dl Type: Time Vitals Blood Pulse: Respiratory Temperature: Capillary Blood Glucose Pulse Action Taken: Pressure: Rate: Glucose (mg/dl): Meter #: Oximetry (%) Taken: Pre 07:47 128/72 61 18 97.3 none per protocol Post 09:47 146/81 52 18 97.9 asymptomatic for bradycardia Treatment Response Treatment Toleration: Well Treatment Completion Status: Treatment Completed without Adverse Event Treatment Notes Mr. Harbottle arrived with normal vital signs. Patient prepared for treatment. After  performing a safety check, patient was placed in the chamber which was compressed with 100% oxygen at a rate of 2 psi/min after confirming normal ear equalization. He tolerated the treatment and subsequent decompression of the chamber at the rate of 2 psi/min. Patient denied symptoms related to bradycardia and/or hypertension. Post treatment vital signs were normal except heart rate of 52 bpm. He denied symptoms related to bradycardia, stating that he felt good. He prepared for departure and was stable upon discharge. Jhony Antrim Notes No concern with treatment given Physician HBO Attestation: I certify that I supervised this HBO treatment in accordance with Medicare guidelines. A trained emergency response team is readily available per Yes hospital policies and procedures. Continue HBOT as ordered. Yes Electronic Signature(s) Signed: 01/08/2023 5:18:28 PM By: Baltazar Najjar MD [place]abyab, LACIE12/11/2022 5:18:28 PM By: Baltazar Najjar MD SignedKathie Rhodes ([id number]) [id number] Page 2 of 2 Previous Signature: 01/08/2023 10:41:45 AM Version By: Haywood Pao CHT EMT BS , , Previous Signature: 01/08/2023 8:33:25 AM Version By: Haywood Pao CHT EMT BS , , Entered By: Baltazar Najjar on 01/08/2023 14:14:48 -------------------------------------------------------------------------------- HBO Safety Checklist Details Patient Name: Date of Service: [name]. 01/08/2023 7:20 A M Medical Record Number: [id number] Patient Account Number: [id number] Date of Birth/Sex: Treating RN: 18-Jul-1940 (82 y.o. [name] Primary Care Ormand Senn: Charissa Bash Other Clinician: Haywood Pao Referring Traniya Prichett: Treating Elwood Bazinet/Extender: Morene Antu in Treatment: 9 HBO Safety Checklist Items Safety Checklist Consent Form Signed Patient voided / foley secured and emptied When did you last eato 0630 Last dose of injectable or oral agent  n/a Ostomy pouch emptied and vented if applicable NA All implantable devices assessed, documented and approved NA Intravenous access site secured and place NA Valuables secured Linens and cotton and cotton/polyester blend (less than 51% polyester) Personal oil-based products / skin lotions / body lotions removed  Wigs or hairpieces removed NA Smoking or tobacco materials removed NA Books / newspapers / magazines / loose paper removed Cologne, aftershave, perfume and deodorant removed Jewelry removed (may wrap wedding band) Make-up removed Hair care products removed Battery operated devices (external) removed Heating patches and chemical warmers removed Titanium eyewear removed Nail polish cured greater than 10 hours NA Casting material cured greater than 10 hours NA Hearing aids removed at home Loose dentures or partials removed Prosthetics have been removed NA Patient demonstrates correct use of air break device (if applicable) Patient concerns have been addressed Patient grounding bracelet on and cord attached to chamber Specifics for Inpatients (complete in addition to above) Medication sheet sent with patient NA Intravenous medications needed or due during therapy sent with patient NA Drainage tubes (e.g. nasogastric tube or chest tube secured and vented) NA Endotracheal or Tracheotomy tube secured NA Cuff deflated of air and inflated with saline NA Airway suctioned NA Notes Paper version used prior to treatment start. Electronic Signature(s) Signed: 01/08/2023 8:32:23 AM By: Haywood Pao CHT EMT BS , , Entered By: Haywood Pao on 01/08/2023 05:32:23

## 2023-01-08 NOTE — Progress Notes (Signed)
SAHEIM, GEARY ([id number]) [id number] Page 1 of 1 Visit Report for 01/07/2023 SuperBill Details Patient Name: Date of Service: [name], [name] 01/07/2023 Medical Record Number: [id number] Patient Account Number: [id number] Date of Birth/Sex: Treating RN: [birth year] (82 y.o. [name] Primary Care Provider: Charissa Bash Other Clinician: Haywood Pao Referring Provider: Treating Provider/Extender: Morene Antu in Treatment: 8 Diagnosis Coding ICD-10 Codes Code Description N30.41 Irradiation cystitis with hematuria R35.1 Nocturia I10 Essential (primary) hypertension Facility Procedures CPT4 Code Description Modifier Quantity 40347425 G0277-(Facility Use Only) HBOT full body chamber, , 4 ICD-10 Diagnosis Description N30.41 Irradiation cystitis with hematuria R35.1 Nocturia I10 Essential (primary) hypertension Physician Procedures Quantity CPT4 Code Description Modifier [id number] 99183 - WC PHYS HYPERBARIC OXYGEN THERAPY 1 ICD-10 Diagnosis Description N30.41 Irradiation cystitis with hematuria R35.1 Nocturia I10 Essential (primary) hypertension Electronic Signature(s) Signed: 01/07/2023 11:20:44 AM By: Haywood Pao CHT EMT BS , , Signed: 01/08/2023 10:03:28 AM By: Baltazar Najjar MD Entered By: Haywood Pao on 01/07/2023 08:20:44

## 2023-01-09 ENCOUNTER — Encounter (HOSPITAL_BASED_OUTPATIENT_CLINIC_OR_DEPARTMENT_OTHER): Payer: Medicare HMO | Admitting: Internal Medicine

## 2023-01-09 DIAGNOSIS — N3041 Irradiation cystitis with hematuria: Secondary | ICD-10-CM | POA: Diagnosis not present

## 2023-01-09 DIAGNOSIS — I10 Essential (primary) hypertension: Secondary | ICD-10-CM | POA: Diagnosis not present

## 2023-01-09 DIAGNOSIS — Z923 Personal history of irradiation: Secondary | ICD-10-CM | POA: Diagnosis not present

## 2023-01-09 DIAGNOSIS — L598 Other specified disorders of the skin and subcutaneous tissue related to radiation: Secondary | ICD-10-CM | POA: Diagnosis not present

## 2023-01-09 DIAGNOSIS — Z8546 Personal history of malignant neoplasm of prostate: Secondary | ICD-10-CM | POA: Diagnosis not present

## 2023-01-09 DIAGNOSIS — R351 Nocturia: Secondary | ICD-10-CM | POA: Diagnosis not present

## 2023-01-09 NOTE — Progress Notes (Signed)
[name], [name] ([id number]) [id number] Page 1 of 2 Visit Report for 01/09/2023 Arrival Information Details Patient Name: Date of Service: [name], [name] 01/09/2023 7:20 A M Medical Record Number: [id number] Patient Account Number: [id number] Date of Birth/Sex: Treating RN: 1940-09-08 (82 y.o. [name], Millard.Loa Primary Care Myleigh Amara: Charissa Bash Other Clinician: Haywood Pao Referring Kilea Mccarey: Treating Korinne Greenstein/Extender: Morene Antu in Treatment: 9 Visit Information History Since Last Visit All ordered tests and consults were completed: Yes Patient Arrived: Ambulatory Added or deleted any medications: No Arrival Time: 07:35 Any new allergies or adverse reactions: No Accompanied By: self Had a fall or experienced change in No Transfer Assistance: None activities of daily living that may affect Patient Identification Verified: Yes risk of falls: Secondary Verification Process Completed: Yes Signs or symptoms of abuse/neglect since last visito No Patient Requires Transmission-Based Precautions: No Hospitalized since last visit: No Patient Has Alerts: No Implantable device outside of the clinic excluding No cellular tissue based products placed in the center since last visit: Pain Present Now: No Electronic Signature(s) Signed: 01/09/2023 3:59:48 PM By: Haywood Pao CHT EMT BS , , Entered By: Haywood Pao on 01/09/2023 12:59:48 -------------------------------------------------------------------------------- Encounter Discharge Information Details Patient Name: Date of Service: [name] Riffle. 01/09/2023 7:20 A M Medical Record Number: [id number] Patient Account Number: [id number] Date of Birth/Sex: Treating RN: Apr 12, 1940 (82 y.o. [name] Primary Care Oluwasemilore Pascuzzi: Charissa Bash Other Clinician: Haywood Pao Referring Graham Hyun: Treating Lenice Koper/Extender: Morene Antu in Treatment: 9 Encounter Discharge Information Items Discharge Condition: Stable Ambulatory Status: Ambulatory Discharge Destination: Home Transportation: Private Auto Accompanied By: self Schedule Follow-up Appointment: No Clinical Summary of Care: Electronic Signature(s) Signed: 01/09/2023 4:05:28 PM By: Haywood Pao CHT EMT BS , , Entered By: Haywood Pao on 01/09/2023 13:05:28 [name] ([id number]) [id number] Page 2 of 2 -------------------------------------------------------------------------------- Vitals Details Patient Name: Date of Service: [name], [name] 01/09/2023 7:20 A M Medical Record Number: [id number] Patient Account Number: [id number] Date of Birth/Sex: Treating RN: December 08, 1940 (82 y.o. [name], Millard.Loa Primary Care Hadlie Gipson: Charissa Bash Other Clinician: Haywood Pao Referring Farrell Broerman: Treating Oree Hislop/Extender: Morene Antu in Treatment: 9 Vital Signs Time Taken: 07:55 Temperature (F): 97.5 Height (in): 71 Pulse (bpm): 54 Weight (lbs): 180 Respiratory Rate (breaths/min): 18 Body Mass Index (BMI): 25.1 Blood Pressure (mmHg): 119/68 Reference Range: 80 - 120 mg / dl Electronic Signature(s) Signed: 01/09/2023 4:00:32 PM By: Haywood Pao CHT EMT BS , , Entered By: Haywood Pao on 01/09/2023 13:00:32

## 2023-01-09 NOTE — Progress Notes (Addendum)
CASSELL, DOWELL ([id number]) [id number] Page 1 of 2 Visit Report for 01/09/2023 HBO Details Patient Name: Date of Service: [name], [name] 01/09/2023 7:20 A M Medical Record Number: [id number] Patient Account Number: [id number] Date of Birth/Sex: Treating RN: September 20, 1940 (82 y.o. [name], Millard.Loa Primary Care Makalya Nave: Charissa Bash Other Clinician: Haywood Pao Referring Shaddai Shapley: Treating Ikeisha Blumberg/Extender: Morene Antu in Treatment: 9 HBO Treatment Course Details Treatment Course Number: 1 Ordering Loren Sawaya: Lenda Kelp T Treatments Ordered: otal 40 HBO Treatment Start Date: 11/11/2022 HBO Indication: Late Effect of Radiation HBO Treatment Details Treatment Number: 39 Patient Type: Outpatient Chamber Type: Monoplace Chamber Serial #: [id number] Treatment Protocol: 2.0 ATA with 90 minutes oxygen, and no air breaks Treatment Details Compression Rate Down: 1.5 psi / minute De-Compression Rate Up: 2.0 psi / minute Air breaks and breathing Decompress Decompress Compress Tx Pressure Begins Reached periods Begins Ends (leave unused spaces blank) Chamber Pressure (ATA 1 2 ------2 1 ) Clock Time (24 hr) 08:03 08:14 - - - - - - 09:44 09:52 Treatment Length: 109 (minutes) Treatment Segments: 4 Vital Signs Capillary Blood Glucose Reference Range: 80 - 120 mg / dl HBO Diabetic Blood Glucose Intervention Range: <131 mg/dl or >308 mg/dl Type: Time Vitals Blood Pulse: Respiratory Temperature: Capillary Blood Glucose Pulse Action Taken: Pressure: Rate: Glucose (mg/dl): Meter #: Oximetry (%) Taken: Pre 07:55 119/68 54 18 97.5 asymptomatic for bradycardia Post 09:54 154/81 60 16 97.7 none per protocol Treatment Response Treatment Toleration: Well Treatment Completion Status: Treatment Completed without Adverse Event Treatment Notes [name] arrived with normal vital signs except pulse of 54 bpm. Patient denied any  symptoms related to bradycardia. Patient prepared for treatment. After performing a safety check, patient was placed in the chamber which was compressed with 100% oxygen at a rate of 2 psi/min after confirming normal ear equalization. He tolerated the treatment and subsequent decompression of the chamber at the rate of 2 psi/min. Patient denied symptoms related to bradycardia and/or hypertension. Post treatment vital signs were normal except heart rate. Manual count of heart rate was 60 bpm. He prepared for departure and was stable upon discharge. [name] Notes No concerns with treatment given Physician HBO Attestation: I certify that I supervised this HBO treatment in accordance with Medicare guidelines. A trained emergency response team is readily available per Yes hospital policies and procedures. Continue HBOT as ordered. [address] JAJA, PROUSE ([id number]) [id number] Page 2 of 2 Signed: 01/09/2023 5:17:16 PM By: Baltazar Najjar MD Previous Signature: 01/09/2023 4:10:24 PM Version By: Haywood Pao CHT EMT BS , , Previous Signature: 01/09/2023 4:04:28 PM Version By: Haywood Pao CHT EMT BS , , Entered By: Baltazar Najjar on 01/09/2023 17:11:14 -------------------------------------------------------------------------------- HBO Safety Checklist Details Patient Name: Date of Service: [name] Riffle. 01/09/2023 7:20 A M Medical Record Number: [id number] Patient Account Number: [id number] Date of Birth/Sex: Treating RN: 10-19-1940 (82 y.o. [name] Primary Care Oslo Huntsman: Charissa Bash Other Clinician: Haywood Pao Referring Docie Abramovich: Treating Joshlynn Alfonzo/Extender: Morene Antu in Treatment: 9 HBO Safety Checklist Items Safety Checklist Consent Form Signed Patient voided / foley secured and emptied When did you last eato Breakfast Last dose of injectable or oral agent n/a Ostomy pouch  emptied and vented if applicable NA All implantable devices assessed, documented and approved NA Intravenous access site secured and place NA Valuables secured Linens and cotton and cotton/polyester blend (less than 51% polyester) Personal oil-based products / skin lotions / body lotions removed Wigs  or hairpieces removed NA Smoking or tobacco materials removed NA Books / newspapers / magazines / loose paper removed Cologne, aftershave, perfume and deodorant removed Jewelry removed (may wrap wedding band) Make-up removed NA Hair care products removed Battery operated devices (external) removed Heating patches and chemical warmers removed Titanium eyewear removed Nail polish cured greater than 10 hours NA Casting material cured greater than 10 hours NA Hearing aids removed at home Loose dentures or partials removed dentures removed Prosthetics have been removed NA Patient demonstrates correct use of air break device (if applicable) Patient concerns have been addressed Patient grounding bracelet on and cord attached to chamber Specifics for Inpatients (complete in addition to above) Medication sheet sent with patient NA Intravenous medications needed or due during therapy sent with patient NA Drainage tubes (e.g. nasogastric tube or chest tube secured and vented) NA Endotracheal or Tracheotomy tube secured NA Cuff deflated of air and inflated with saline NA Airway suctioned NA Notes Paper version used prior to treatment start. Electronic Signature(s) Signed: 01/09/2023 4:01:38 PM By: Haywood Pao CHT EMT BS , , Entered By: Haywood Pao on 01/09/2023 16:01:38

## 2023-01-09 NOTE — Progress Notes (Signed)
MUSIQ, VEST ([id number]) [id number] Page 1 of 1 Visit Report for 01/08/2023 SuperBill Details Patient Name: Date of Service: [name], [name] 01/08/2023 Medical Record Number: [id number] Patient Account Number: [id number] Date of Birth/Sex: Treating RN: Aug 02, 1940 (82 y.o. [name] Primary Care Provider: Charissa Bash Other Clinician: Haywood Pao Referring Provider: Treating Provider/Extender: Morene Antu in Treatment: 9 Diagnosis Coding ICD-10 Codes Code Description N30.41 Irradiation cystitis with hematuria R35.1 Nocturia I10 Essential (primary) hypertension Facility Procedures CPT4 Code Description Modifier Quantity [id number] G0277-(Facility Use Only) HBOT full body chamber, , 4 ICD-10 Diagnosis Description N30.41 Irradiation cystitis with hematuria R35.1 Nocturia I10 Essential (primary) hypertension Physician Procedures Quantity CPT4 Code Description Modifier 2440102 99183 - WC PHYS HYPERBARIC OXYGEN THERAPY 1 ICD-10 Diagnosis Description N30.41 Irradiation cystitis with hematuria R35.1 Nocturia I10 Essential (primary) hypertension Electronic Signature(s) Signed: 01/08/2023 10:42:00 AM By: Haywood Pao CHT EMT BS , , Signed: 01/08/2023 5:18:28 PM By: Baltazar Najjar MD Entered By: Haywood Pao on 01/08/2023 07:42:00

## 2023-01-10 NOTE — Progress Notes (Signed)
RY, KHAM ([id number]) [id number] Page 1 of 1 Visit Report for 01/09/2023 SuperBill Details Patient Name: Date of Service: [name], [name] 01/09/2023 Medical Record Number: [id number] Patient Account Number: [id number] Date of Birth/Sex: Treating RN: 10/28/1940 (82 y.o. [name] Primary Care Provider: Charissa Bash Other Clinician: Haywood Pao Referring Provider: Treating Provider/Extender: Morene Antu in Treatment: 9 Diagnosis Coding ICD-10 Codes Code Description N30.41 Irradiation cystitis with hematuria R35.1 Nocturia I10 Essential (primary) hypertension Facility Procedures CPT4 Code Description Modifier Quantity [id number] G0277-(Facility Use Only) HBOT full body chamber, , 4 ICD-10 Diagnosis Description N30.41 Irradiation cystitis with hematuria R35.1 Nocturia I10 Essential (primary) hypertension Physician Procedures Quantity CPT4 Code Description Modifier 3086578 99183 - WC PHYS HYPERBARIC OXYGEN THERAPY 1 ICD-10 Diagnosis Description N30.41 Irradiation cystitis with hematuria R35.1 Nocturia I10 Essential (primary) hypertension Electronic Signature(s) Signed: 01/09/2023 4:04:46 PM By: Haywood Pao CHT EMT BS , , Signed: 01/09/2023 5:17:16 PM By: Baltazar Najjar MD Entered By: Haywood Pao on 01/09/2023 16:04:46

## 2023-01-13 ENCOUNTER — Encounter (HOSPITAL_BASED_OUTPATIENT_CLINIC_OR_DEPARTMENT_OTHER): Payer: Medicare HMO | Admitting: General Surgery

## 2023-01-13 ENCOUNTER — Encounter (HOSPITAL_BASED_OUTPATIENT_CLINIC_OR_DEPARTMENT_OTHER): Payer: Medicare HMO | Admitting: Internal Medicine

## 2023-01-13 DIAGNOSIS — R351 Nocturia: Secondary | ICD-10-CM | POA: Diagnosis not present

## 2023-01-13 DIAGNOSIS — Z8546 Personal history of malignant neoplasm of prostate: Secondary | ICD-10-CM | POA: Diagnosis not present

## 2023-01-13 DIAGNOSIS — N3041 Irradiation cystitis with hematuria: Secondary | ICD-10-CM | POA: Diagnosis not present

## 2023-01-13 DIAGNOSIS — L598 Other specified disorders of the skin and subcutaneous tissue related to radiation: Secondary | ICD-10-CM | POA: Diagnosis not present

## 2023-01-13 DIAGNOSIS — Z923 Personal history of irradiation: Secondary | ICD-10-CM | POA: Diagnosis not present

## 2023-01-13 DIAGNOSIS — I10 Essential (primary) hypertension: Secondary | ICD-10-CM | POA: Diagnosis not present

## 2023-01-13 NOTE — Progress Notes (Signed)
STEVEN, SLEPPY ([id number]) [id number] Page 1 of 1 Visit Report for 01/13/2023 SuperBill Details Patient Name: Date of Service: [name], [name] 01/13/2023 Medical Record Number: [id number] Patient Account Number: [id number] Date of Birth/Sex: Treating RN: 1940-07-03 (82 y.o. [name] Primary Care Provider: Charissa Bash Other Clinician: Haywood Pao Referring Provider: Treating Provider/Extender: Claris Gower in Treatment: 9 Diagnosis Coding ICD-10 Codes Code Description N30.41 Irradiation cystitis with hematuria R35.1 Nocturia I10 Essential (primary) hypertension Facility Procedures CPT4 Code Description Modifier Quantity [id number] G0277-(Facility Use Only) HBOT full body chamber, , 4 ICD-10 Diagnosis Description N30.41 Irradiation cystitis with hematuria R35.1 Nocturia I10 Essential (primary) hypertension Physician Procedures Quantity CPT4 Code Description Modifier 1324401 99183 - WC PHYS HYPERBARIC OXYGEN THERAPY 1 ICD-10 Diagnosis Description N30.41 Irradiation cystitis with hematuria R35.1 Nocturia I10 Essential (primary) hypertension Electronic Signature(s) Signed: 01/13/2023 10:12:56 AM By: Haywood Pao CHT EMT BS , , Signed: 01/13/2023 10:41:43 AM By: Duanne Guess MD FACS Previous Signature: 01/13/2023 10:11:44 AM Version By: Haywood Pao CHT EMT BS , , Entered By: Haywood Pao on 01/13/2023 10:12:56

## 2023-01-13 NOTE — Progress Notes (Addendum)
LOOMIS, YARBER ([id number]) [id number] Page 1 of 2 Visit Report for 01/13/2023 Arrival Information Details Patient Name: Date of Service: KEYJUAN, TIANO 01/13/2023 7:20 A M Medical Record Number: [id number] Patient Account Number: [id number] Date of Birth/Sex: Treating RN: 09/22/1940 (82 y.o. [name], [name] Primary Care Poppi Scantling: Charissa Bash Other Clinician: Haywood Pao Referring Cady Hafen: Treating Jurnie Garritano/Extender: Claris Gower in Treatment: 9 Visit Information History Since Last Visit All ordered tests and consults were completed: Yes Patient Arrived: Ambulatory Added or deleted any medications: No Arrival Time: 07:20 Any new allergies or adverse reactions: No Accompanied By: self Had a fall or experienced change in No Transfer Assistance: None activities of daily living that may affect Patient Identification Verified: Yes risk of falls: Secondary Verification Process Completed: Yes Signs or symptoms of abuse/neglect since last visito No Patient Requires Transmission-Based Precautions: No Hospitalized since last visit: No Patient Has Alerts: No Implantable device outside of the clinic excluding No cellular tissue based products placed in the center since last visit: Pain Present Now: No Electronic Signature(s) Signed: 01/13/2023 10:11:56 AM By: Haywood Pao CHT EMT BS , , Previous Signature: 01/13/2023 9:30:46 AM Version By: Haywood Pao CHT EMT BS , , Entered By: Haywood Pao on 01/13/2023 10:11:56 -------------------------------------------------------------------------------- Encounter Discharge Information Details Patient Name: Date of Service: [name] 01/13/2023 7:20 A M Medical Record Number: [id number] Patient Account Number: [id number] Date of Birth/Sex: Treating RN: 1940/03/20 (82 y.o. [name] Primary Care Kristyanna Barcelo: Charissa Bash Other Clinician:  Haywood Pao Referring Kimball Appleby: Treating Labaron Digirolamo/Extender: Morene Antu in Treatment: 9 Encounter Discharge Information Items Discharge Condition: Stable Ambulatory Status: Ambulatory Discharge Destination: Other (Note Required) Transportation: Other Accompanied By: self Schedule Follow-up Appointment: No Clinical Summary of Care: Notes Wound Care after HBOT today. Electronic Signature(s) Signed: 01/13/2023 10:13:36 AM By: Haywood Pao CHT EMT BS , , Entered By: Haywood Pao on 01/13/2023 10:13:36 Rahsan, Awad Hilbert Bible ([id number]) [id number] Page 2 of 2 -------------------------------------------------------------------------------- Vitals Details Patient Name: Date of Service: HOSEA, FULK 01/13/2023 7:20 A M Medical Record Number: [id number] Patient Account Number: [id number] Date of Birth/Sex: Treating RN: 10-Sep-1940 (82 y.o. [name] Primary Care Cyril Railey: Charissa Bash Other Clinician: Haywood Pao Referring Lajuanna Pompa: Treating Arline Ketter/Extender: Claris Gower in Treatment: 9 Vital Signs Time Taken: 07:37 Temperature (F): 97.3 Height (in): 71 Pulse (bpm): 54 Weight (lbs): 180 Respiratory Rate (breaths/min): 18 Body Mass Index (BMI): 25.1 Blood Pressure (mmHg): 129/69 Reference Range: 80 - 120 mg / dl Electronic Signature(s) Signed: 01/13/2023 10:12:08 AM By: Haywood Pao CHT EMT BS , , Previous Signature: 01/13/2023 10:01:09 AM Version By: Haywood Pao CHT EMT BS , , Entered By: Haywood Pao on 01/13/2023 10:12:08

## 2023-01-13 NOTE — Progress Notes (Signed)
ANDERSSON, SCHLOTTER ([id number]) [id number] Page 1 of 5 Visit Report for 01/13/2023 Arrival Information Details Patient Name: Date of Service: [name], [name] 01/13/2023 9:45 A M Medical Record Number: [id number] Patient Account Number: [id number] Date of Birth/Sex: Treating RN: 10-11-1940 (82 y.o. M) Primary Care Jishnu Jenniges: Charissa Bash Other Clinician: Referring Corrion Stirewalt: Treating Venie Montesinos/Extender: Morene Antu in Treatment: 9 Visit Information History Since Last Visit Added or deleted any medications: No Patient Arrived: Ambulatory Any new allergies or adverse reactions: No Arrival Time: 10:16 Had a fall or experienced change in No Accompanied By: self activities of daily living that may affect Transfer Assistance: None risk of falls: Patient Identification Verified: Yes Signs or symptoms of abuse/neglect since last visito No Secondary Verification Process Completed: Yes Hospitalized since last visit: No Patient Requires Transmission-Based Precautions: No Implantable device outside of the clinic excluding No Patient Has Alerts: No cellular tissue based products placed in the center since last visit: Pain Present Now: No Electronic Signature(s) Unsigned Entered By: Thayer Dallas on 01/13/2023 10:16:48 -------------------------------------------------------------------------------- Clinic Level of Care Assessment Details Patient Name: Date of Service: [name], [name] 01/13/2023 9:45 A M Medical Record Number: [id number] Patient Account Number: [id number] Date of Birth/Sex: Treating RN: Apr 25, 1940 (82 y.o. [name] Primary Care Yevette Knust: Charissa Bash Other Clinician: Referring Nico Rogness: Treating Shella Lahman/Extender: Morene Antu in Treatment: 9 Clinic Level of Care Assessment Items TOOL 4 Quantity Score X- 1 0 Use when only an EandM is performed on FOLLOW-UP visit ASSESSMENTS -  Nursing Assessment / Reassessment X- 1 10 Reassessment of Co-morbidities (includes updates in patient status) X- 1 5 Reassessment of Adherence to Treatment Plan ASSESSMENTS - Wound and Skin A ssessment / Reassessment []  - 0 Simple Wound Assessment / Reassessment - one wound []  - 0 Complex Wound Assessment / Reassessment - multiple wounds []  - 0 Dermatologic / Skin Assessment (not related to wound area) ASSESSMENTS - Focused Assessment []  - 0 Circumferential Edema Measurements - multi extremities []  - 0 Nutritional Assessment / Counseling / Intervention [name], [name] ([id number]) [phone].pdf Page 2 of 5 []  - 0 Lower Extremity Assessment (monofilament, tuning fork, pulses) []  - 0 Peripheral Arterial Disease Assessment (using hand held doppler) ASSESSMENTS - Ostomy and/or Continence Assessment and Care []  - 0 Incontinence Assessment and Management []  - 0 Ostomy Care Assessment and Management (repouching, etc.) PROCESS - Coordination of Care []  - 0 Simple Patient / Family Education for ongoing care []  - 0 Complex (extensive) Patient / Family Education for ongoing care X- 1 10 Staff obtains Chiropractor, Records, T Results / Process Orders est []  - 0 Staff telephones HHA, Nursing Homes / Clarify orders / etc []  - 0 Routine Transfer to another Facility (non-emergent condition) []  - 0 Routine Hospital Admission (non-emergent condition) []  - 0 New Admissions / Manufacturing engineer / Ordering NPWT Apligraf, etc. , []  - 0 Emergency Hospital Admission (emergent condition) X- 1 10 Simple Discharge Coordination []  - 0 Complex (extensive) Discharge Coordination PROCESS - Special Needs []  - 0 Pediatric / Minor Patient Management []  - 0 Isolation Patient Management []  - 0 Hearing / Language / Visual special needs []  - 0 Assessment of Community assistance (transportation, D/C planning, etc.) []  - 0 Additional assistance / Altered mentation []  -  0 Support Surface(s) Assessment (bed, cushion, seat, etc.) INTERVENTIONS - Wound Cleansing / Measurement []  - 0 Simple Wound Cleansing - one wound []  - 0 Complex Wound Cleansing - multiple wounds []  - 0 Wound  Imaging (photographs - any number of wounds) []  - 0 Wound Tracing (instead of photographs) []  - 0 Simple Wound Measurement - one wound []  - 0 Complex Wound Measurement - multiple wounds INTERVENTIONS - Wound Dressings []  - 0 Small Wound Dressing one or multiple wounds []  - 0 Medium Wound Dressing one or multiple wounds []  - 0 Large Wound Dressing one or multiple wounds []  - 0 Application of Medications - topical []  - 0 Application of Medications - injection INTERVENTIONS - Miscellaneous []  - 0 External ear exam []  - 0 Specimen Collection (cultures, biopsies, blood, body fluids, etc.) []  - 0 Specimen(s) / Culture(s) sent or taken to Lab for analysis []  - 0 Patient Transfer (multiple staff / Nurse, adult / Similar devices) []  - 0 Simple Staple / Suture removal (25 or less) []  - 0 Complex Staple / Suture removal (26 or more) []  - 0 Hypo / Hyperglycemic Management (close monitor of Blood Glucose) [name], [name] ([id number]) [id number] Page 3 of 5 []  - 0 Ankle / Brachial Index (ABI) - do not check if billed separately X- 1 5 Vital Signs Has the patient been seen at the hospital within the last three years: Yes Total Score: 40 Level Of Care: New/Established - Level 2 Electronic Signature(s) Unsigned Entered By: Redmond Pulling on 01/13/2023 10:28:45 -------------------------------------------------------------------------------- Encounter Discharge Information Details Patient Name: Date of Service: [name], [name] 01/13/2023 9:45 A M Medical Record Number: [id number] Patient Account Number: [id number] Date of Birth/Sex: Treating RN: 1940-11-18 (82 y.o. [name] Primary Care Gabrille Kilbride: Charissa Bash Other  Clinician: Referring Mouna Yager: Treating Doloras Tellado/Extender: Morene Antu in Treatment: 9 Encounter Discharge Information Items Discharge Condition: Stable Ambulatory Status: Ambulatory Discharge Destination: Home Transportation: Private Auto Accompanied By: self Schedule Follow-up Appointment: Yes Clinical Summary of Care: Patient Declined Electronic Signature(s) Unsigned Entered By: Redmond Pulling on 01/13/2023 10:29:22 -------------------------------------------------------------------------------- Lower Extremity Assessment Details Patient Name: Date of Service: [name], [name] 01/13/2023 9:45 A M Medical Record Number: [id number] Patient Account Number: [id number] Date of Birth/Sex: Treating RN: 21-Jul-1940 (82 y.o. M) Primary Care Marelly Wehrman: Charissa Bash Other Clinician: Referring Kiriana Worthington: Treating Kris No/Extender: Morene Antu in Treatment: 9 Electronic Signature(s) Unsigned Entered By: Thayer Dallas on 01/13/2023 10:17:10 Signature(s): LEVONTAE, REPASS ([id number]) [id number] Date(s): [phone] Page 4 of 5 -------------------------------------------------------------------------------- Multi-Disciplinary Care Plan Details Patient Name: Date of Service: [name], [name] 01/13/2023 9:45 A M Medical Record Number: [id number] Patient Account Number: [id number] Date of Birth/Sex: Treating RN: 05-04-1940 (82 y.o. [name] Primary Care Claborn Janusz: Charissa Bash Other Clinician: Referring Maaz Spiering: Treating Celena Lanius/Extender: Morene Antu in Treatment: 9 Active Inactive Electronic Signature(s) Unsigned Entered By: Redmond Pulling on 01/13/2023 10:26:13 -------------------------------------------------------------------------------- Pain Assessment Details Patient Name: Date of Service: [name], [name] 01/13/2023 9:45 A M Medical Record Number:  [id number] Patient Account Number: [id number] Date of Birth/Sex: Treating RN: July 03, 1940 (82 y.o. M) Primary Care Corin Tilly: Charissa Bash Other Clinician: Referring Latiana Tomei: Treating Jeneane Pieczynski/Extender: Morene Antu in Treatment: 9 Active Problems Location of Pain Severity and Description of Pain Patient Has Paino No Site Locations Pain Management and Medication Current Pain Management: Electronic Signature(s) Unsigned Entered By: Thayer Dallas on 01/13/2023 10:16:59 Signature(s): LEEROY, RUEDAS ([id number]) Date(s): [id number] Page 5 of 5 -------------------------------------------------------------------------------- Patient/Caregiver Education Details Patient Name: Date of Service: [name], [name] 12/16/2024andnbsp9:45 A M Medical Record Number: [id number] Patient Account Number: [id number] Date of Birth/Gender: Treating RN: November 23, 1940 (82 y.o. [name] Primary  Care Physician: Charissa Bash Other Clinician: Referring Physician: Treating Physician/Extender: Morene Antu in Treatment: 9 Education Assessment Education Provided To: Patient Education Topics Provided Hyperbaric Oxygenation: Methods: Explain/Verbal Responses: State content correctly Electronic Signature(s) Unsigned Entered By: Redmond Pulling on 01/13/2023 10:26:55 -------------------------------------------------------------------------------- Vitals Details Patient Name: Date of Service: [name], [name] 01/13/2023 9:45 A M Medical Record Number: [id number] Patient Account Number: [id number] Date of Birth/Sex: Treating RN: [birth year] (82 y.o. M) Primary Care Rion Schnitzer: Charissa Bash Other Clinician: Referring Teresa Nicodemus: Treating Evalee Gerard/Extender: Morene Antu in Treatment: 9 Vital Signs Time Taken: 09:56 Temperature (F): 97.3 Height (in): 71 Pulse (bpm): 48 Weight (lbs):  180 Respiratory Rate (breaths/min): 18 Body Mass Index (BMI): 25.1 Blood Pressure (mmHg): 139/70 Reference Range: 80 - 120 mg / dl Electronic Signature(s) Signed: 01/13/2023 9:59:13 AM By: Haywood Pao CHT EMT BS , , Entered By: Haywood Pao on 01/13/2023 09:59:13

## 2023-01-13 NOTE — Progress Notes (Addendum)
JERIME, GONYA ([id number]) [id number] Page 1 of 2 Visit Report for 01/13/2023 HBO Details Patient Name: Date of Service: [name], [name] 01/13/2023 7:20 A M Medical Record Number: [id number] Patient Account Number: [id number] Date of Birth/Sex: Treating RN: May 26, 1940 (82 y.o. [name] Primary Care Haizlee Henton: Charissa Bash Other Clinician: Haywood Pao Referring Kennetta Pavlovic: Treating Mozel Burdett/Extender: Claris Gower in Treatment: 9 HBO Treatment Course Details Treatment Course Number: 1 Ordering Caroljean Monsivais: Lenda Kelp T Treatments Ordered: otal 40 HBO Treatment 11/11/2022 Start Date: HBO Indication: Late Effect of Radiation HBO Treatment 01/13/2023 End Date: HBO Discharge Treatment Series Complete; Non-Wound Protocol Outcome: Completed with Symptom Relief HBO Treatment Details Treatment Number: 40 Patient Type: Outpatient Chamber Type: Monoplace Chamber Serial #: [id number] Treatment Protocol: 2.0 ATA with 90 minutes oxygen, and no air breaks Treatment Details Compression Rate Down: 1.5 psi / minute De-Compression Rate Up: 2.0 psi / minute Air breaks and breathing Decompress Decompress Compress Tx Pressure Begins Reached periods Begins Ends (leave unused spaces blank) Chamber Pressure (ATA 1 2 ------2 1 ) Clock Time (24 hr) 07:58 08:12 - - - - - - 09:42 09:53 Treatment Length: 115 (minutes) Treatment Segments: 4 Vital Signs Capillary Blood Glucose Reference Range: 80 - 120 mg / dl HBO Diabetic Blood Glucose Intervention Range: <131 mg/dl or >962 mg/dl Type: Time Vitals Blood Pulse: Respiratory Temperature: Capillary Blood Glucose Pulse Action Taken: Pressure: Rate: Glucose (mg/dl): Meter #: Oximetry (%) Taken: Pre 07:37 129/69 54 18 97.3 asymptomatic for bradycardia Post 09:56 139/70 48 18 97.2 asymptomatic for bradycardia Treatment Response Treatment Toleration: Well Treatment Completion  Status: Treatment Completed without Adverse Event Treatment Notes Mr. Toves arrived with normal vital signs except pulse of 54 bpm. Patient denied any symptoms related to bradycardia. Patient prepared for treatment. After performing a safety check, patient was placed in the chamber which was compressed with 100% oxygen at a rate of 2 psi/min after confirming normal ear equalization. He tolerated the treatment and subsequent decompression of the chamber at the rate of 2 psi/min. Patient denied symptoms related to bradycardia and/or hypertension. Post treatment vital signs were normal except heart rate. Manual count of heart rate was 48 bpm. He denied any symptoms related to bradycardia. He prepared for departure and was stable upon discharge for wound care visit. Physician HBO Attestation: I certify that I supervised this HBO treatment in accordance with Medicare guidelines. A trained emergency response team is readily available per Yes hospital policies and procedures. Continue HBOT as ordered. [address] U[place]H, WELDEN ([id number]) [id number] Page 2 of 2 Signed: 01/13/2023 10:53:22 AM By: Duanne Guess MD FACS Previous Signature: 01/13/2023 10:12:29 AM Version By: Haywood Pao CHT EMT BS , , Previous Signature: 01/13/2023 10:10:18 AM Version By: Haywood Pao CHT EMT BS , , Entered By: Duanne Guess on 01/13/2023 10:53:09 -------------------------------------------------------------------------------- HBO Safety Checklist Details Patient Name: Date of Service: [name] 01/13/2023 7:20 A M Medical Record Number: [id number] Patient Account Number: [id number] Date of Birth/Sex: Treating RN: 06-28-1940 (82 y.o. [name] Primary Care Henrine Hayter: Charissa Bash Other Clinician: Haywood Pao Referring Mehul Rudin: Treating Uniqua Kihn/Extender: Claris Gower in Treatment: 9 HBO Safety  Checklist Items Safety Checklist Consent Form Signed Patient voided / foley secured and emptied When did you last eato Breakfast Last dose of injectable or oral agent n/a Ostomy pouch emptied and vented if applicable NA All implantable devices assessed, documented and approved NA Intravenous access site secured and place NA Valuables  secured Linens and cotton and cotton/polyester blend (less than 51% polyester) Personal oil-based products / skin lotions / body lotions removed Wigs or hairpieces removed NA Smoking or tobacco materials removed NA Books / newspapers / magazines / loose paper removed Cologne, aftershave, perfume and deodorant removed Jewelry removed (may wrap wedding band) Make-up removed Hair care products removed Battery operated devices (external) removed Heating patches and chemical warmers removed Titanium eyewear removed Nail polish cured greater than 10 hours NA Casting material cured greater than 10 hours NA Hearing aids removed at home Loose dentures or partials removed dentures removed Prosthetics have been removed NA Patient demonstrates correct use of air break device (if applicable) Patient concerns have been addressed Patient grounding bracelet on and cord attached to chamber Specifics for Inpatients (complete in addition to above) Medication sheet sent with patient NA Intravenous medications needed or due during therapy sent with patient NA Drainage tubes (e.g. nasogastric tube or chest tube secured and vented) NA Endotracheal or Tracheotomy tube secured NA Cuff deflated of air and inflated with saline NA Airway suctioned NA Notes Paper version used prior to treatment start. Electronic Signature(s) Signed: 01/13/2023 10:12:16 AM By: Haywood Pao CHT EMT BS , , Previous Signature: 01/13/2023 10:04:32 AM Version By: Haywood Pao CHT EMT BS , , Entered By: Haywood Pao on 01/13/2023 10:12:16

## 2023-01-13 NOTE — Progress Notes (Signed)
KAHLER, WEHBE ([id number]) [id number] Page 1 of 4 Visit Report for 01/13/2023 HPI Details Patient Name: Date of Service: [name], [name] 01/13/2023 9:45 A M Medical Record Number: [id number] Patient Account Number: [id number] Date of Birth/Sex: Treating RN: [birth year] (82 y.o. M) Primary Care Provider: Charissa Bash Other Clinician: Referring Provider: Treating Provider/Extender: Morene Antu in Treatment: 9 History of Present Illness HPI Description: 11-06-2022 upon evaluation today patient presents for initial inspection here in our clinic concerning issues that he has been having with bleeding, nocturia, and urinary frequency following having undergone radiation therapy for prostate cancer. The patient had radiation from 09-05-2021 through 8- 14-2023. There was a total of 75 Gy during the course of treatment. At the completion of treatment it was noted that the patient had no issues warranting further action that were identified which is excellent news. With that being said the patient has had multiple visits with his urologist. The most recent was apparently on October 24, 2022. At that visit it was noted that the patient had a previous infection with Enterococcus which had been successfully treated. And that was on 09-09-2022. On 10-17-2022 he was in the emergency department for a couple of days with worsening symptoms with regard to urinary issues. The culture at that point showed only 10,000 colonies of Enterococcus he was treated with Keflex for 4 times a day and that he was seen due to gross hematuria at the urology office. CT study in July showed that there were no stones or suspicious mass or lesions in the kidneys no obstructive signs there was bladder wall thickening and surrounding inflammation suggestive of cystitis. 4 days prior to the 19th this would have been around the 15th the patient did have issues with gross  hematuria with increases in frequency and urgency and dysuria. He has been trying to drink plenty of water but nonetheless this continues to be a back-and-forth issue that was not clearing. Subsequently the patient had a cystoscopy which was ordered for 10-24-2022 and this was positive for some moderate hyperplasia with regard to the prostate with hypervascularity although there was definitive findings of flat patches of radiation cystitis but no areas of urethral tumor which was good news. At that point the urinalysis showed blood and protein but no bacteria noted and there did not appear to be any white blood cells indicative of an infection. Subsequently it was recommended that hyperbaric oxygen therapy would be the ideal thing due to radiation cystitis noted on the cystoscopy. Since that time the patient tells me symptoms got a little bit better and then subsequently worse 2 days ago he was having quite a bit of blood and frequency along with nocturia. Last night he got up 3 times again this tends to still be an ongoing issue where he is seeing blood in his urine at least every 1 to 2 days sometimes worse than others. Patient does have a history of hypertension but at the same time has really no other major medical problems that would affect things here he is not on any blood thinners which is also good news. 12/04/2022; patient is seen for 30-day evaluation. He is tolerating hyperbarics well. His symptoms were bleeding, urinary frequency and dysuria 12/16; patient was seen today for 30-day evaluation. He is completing HBO and his last dive is today. This was for radiation cystitis. He reports that his hematuria has resolved. He is also had improvements in his urinary frequency and nocturia. He has had no  dysuria although that was not part of his initial symptom complex. Electronic Signature(s) Signed: 01/13/2023 4:04:15 PM By: Baltazar Najjar MD Entered By: Baltazar Najjar on 01/13/2023  10:39:44 -------------------------------------------------------------------------------- Physician Orders Details Patient Name: Date of Service: [name] 01/13/2023 9:45 A M Medical Record Number: [id number] Patient Account Number: [id number] Date of Birth/Sex: Treating RN: 07/16/1940 (82 y.o. [name] Primary Care Provider: Charissa Bash Other Clinician: Referring Provider: Treating Provider/Extender: Morene Antu in Treatment: 9 Verbal / Phone Orders: No Diagnosis Coding Discharge From [facility]charge from [facility] - Congratulations on completing HBO treatments! Hyperbaric Oxygen Therapy Discontinue HBO Therapy [name], [name] ([id number]) [id number] Page 2 of 4 Electronic Signature(s) Signed: 01/13/2023 4:04:15 PM By: Baltazar Najjar MD Signed: 01/13/2023 4:36:59 PM By: Redmond Pulling RN, BSN Entered By: Redmond Pulling on 01/13/2023 10:25:46 -------------------------------------------------------------------------------- Problem List Details Patient Name: Date of Service: [name]. 01/13/2023 9:45 A M Medical Record Number: [id number] Patient Account Number: [id number] Date of Birth/Sex: Treating RN: 1940-10-29 (82 y.o. M) Primary Care Provider: Charissa Bash Other Clinician: Referring Provider: Treating Provider/Extender: Morene Antu in Treatment: 9 Active Problems ICD-10 Encounter Code Description Active Date MDM Diagnosis N30.41 Irradiation cystitis with hematuria 11/06/2022 No Yes R35.1 Nocturia 11/06/2022 No Yes I10 Essential (primary) hypertension 11/06/2022 No Yes Inactive Problems Resolved Problems Electronic Signature(s) Signed: 01/13/2023 4:04:15 PM By: Baltazar Najjar MD Entered By: Baltazar Najjar on 01/13/2023 10:36:10 -------------------------------------------------------------------------------- Progress Note Details Patient  Name: Date of Service: [name]. 01/13/2023 9:45 A M Medical Record Number: [id number] Patient Account Number: [id number] Date of Birth/Sex: Treating RN: August 11, 1940 (82 y.o. M) Primary Care Provider: Charissa Bash Other Clinician: Referring Provider: Treating Provider/Extender: Morene Antu in Treatment: 9 Subjective History of Present Illness (HPI) 11-06-2022 upon evaluation today patient presents for initial inspection here in our clinic concerning issues that he has been having with bleeding, nocturia, and urinary frequency following having undergone radiation therapy for prostate cancer. The patient had radiation from 09-05-2021 through 09-10-2021. There was a total of 75 Gy during the course of treatment. At the completion of treatment it was noted that the patient had no issues warranting further action that were identified which is excellent news. With that being said the patient has had multiple visits with his urologist. The most recent was apparently on October 24, 2022. At that visit it was noted that the patient had a previous infection with Enterococcus which had been successfully treated. And that was on 09-09-2022. On 10-17-2022 he was in the emergency department for a couple of days with worsening symptoms with regard to urinary issues. The culture at that point showed [name], [name] ([id number]) [id number] Page 3 of 4 only 10,000 colonies of Enterococcus he was treated with Keflex for 4 times a day and that he was seen due to gross hematuria at the urology office. CT study in July showed that there were no stones or suspicious mass or lesions in the kidneys no obstructive signs there was bladder wall thickening and surrounding inflammation suggestive of cystitis. 4 days prior to the 19th this would have been around the 15th the patient did have issues with gross hematuria with increases in frequency and urgency and  dysuria. He has been trying to drink plenty of water but nonetheless this continues to be a back-and-forth issue that was not clearing. Subsequently the patient had a cystoscopy which was ordered for 10-24-2022 and this was positive for  some moderate hyperplasia with regard to the prostate with hypervascularity although there was definitive findings of flat patches of radiation cystitis but no areas of urethral tumor which was good news. At that point the urinalysis showed blood and protein but no bacteria noted and there did not appear to be any white blood cells indicative of an infection. Subsequently it was recommended that hyperbaric oxygen therapy would be the ideal thing due to radiation cystitis noted on the cystoscopy. Since that time the patient tells me symptoms got a little bit better and then subsequently worse 2 days ago he was having quite a bit of blood and frequency along with nocturia. Last night he got up 3 times again this tends to still be an ongoing issue where he is seeing blood in his urine at least every 1 to 2 days sometimes worse than others. Patient does have a history of hypertension but at the same time has really no other major medical problems that would affect things here he is not on any blood thinners which is also good news. 12/04/2022; patient is seen for 30-day evaluation. He is tolerating hyperbarics well. His symptoms were bleeding, urinary frequency and dysuria 12/16; patient was seen today for 30-day evaluation. He is completing HBO and his last dive is today. This was for radiation cystitis. He reports that his hematuria has resolved. He is also had improvements in his urinary frequency and nocturia. He has had no dysuria although that was not part of his initial symptom complex. Objective Constitutional Vitals Time Taken: 9:56 AM, Height: 71 in, Weight: 180 lbs, BMI: 25.1, Temperature: 97.3 F, Pulse: 48 bpm, Respiratory Rate: 18 breaths/min, Blood  Pressure: 139/70 mmHg. Assessment Active Problems ICD-10 Irradiation cystitis with hematuria Nocturia Essential (primary) hypertension Plan Discharge From [facility]: Discharge from [facility] - Congratulations on completing HBO treatments! Hyperbaric Oxygen Therapy: Discontinue HBO Therapy 1. The patient is tolerating HBO well and is he is completing his treatment today. 2. The patient can be discharged from HBO and from our center today 3. He had improvement in his symptom complex particularly his hematuria also lesser degrees of improvement in urinary frequency and nocturia. Electronic Signature(s) Signed: 01/13/2023 4:04:15 PM By: Baltazar Najjar MD Entered By: Baltazar Najjar on 01/13/2023 10:40:34 -------------------------------------------------------------------------------- SuperBill Details Patient Name: Date of Service: [name] 01/13/2023 Medical Record Number: [id number] Patient Account Number: [id number] [name], [name] ([id number]) [id number] Page 4 of 4 Date of Birth/Sex: Treating RN: [birth year] (82 y.o. [name] Primary Care Provider: Charissa Bash Other Clinician: Referring Provider: Treating Provider/Extender: Morene Antu in Treatment: 9 Diagnosis Coding ICD-10 Codes Code Description N30.41 Irradiation cystitis with hematuria R35.1 Nocturia I10 Essential (primary) hypertension Facility Procedures : CPT4 Code: [id number] Description: [phone] - WOUND CARE VISIT-LEV 2 EST PT Modifier: Quantity: 1 Physician Procedures : CPT4 Code Description Modifier [id number] - WC PHYS LEVEL 2 - EST PT ICD-10 Diagnosis Description N30.41 Irradiation cystitis with hematuria R35.1 Nocturia Quantity: 1 Electronic Signature(s) Signed: 01/13/2023 4:04:15 PM By: Baltazar Najjar MD Entered By: Baltazar Najjar on 01/13/2023 10:40:53

## 2023-01-27 NOTE — Progress Notes (Signed)
IRIE, MERKER ([id number]) [id number] Page 1 of 1 Visit Report for 01/02/2023 SuperBill Details Patient Name: Date of Service: [name], [name] 01/02/2023 Medical Record Number: [id number] Patient Account Number: [id number] Date of Birth/Sex: Treating RN: 04/27/1940 (83 y.o. [name] Primary Care Provider: Charissa Bash Other Clinician: Daryll Brod Referring Provider: Treating Provider/Extender: Morene Antu in Treatment: 8 Diagnosis Coding ICD-10 Codes Code Description N30.41 Irradiation cystitis with hematuria R35.1 Nocturia I10 Essential (primary) hypertension Facility Procedures CPT4 Code Description Modifier Quantity [id number]-(Facility Use Only) HBOT full body chamber, , 4 Physician Procedures Quantity CPT4 Code Description Modifier [id number] - WC PHYS HYPERBARIC OXYGEN THERAPY 1 ICD-10 Diagnosis Description N30.41 Irradiation cystitis with hematuria Electronic Signature(s) Signed: 01/02/2023 5:41:20 PM By: Baltazar Najjar MD Signed: 01/27/2023 3:37:49 PM By: Demetria Pore Entered By: Demetria Pore on 01/02/2023 07:20:29

## 2023-01-27 NOTE — Progress Notes (Signed)
STIRLING, DEHARO ([id number]) [id number] Page 1 of 3 Visit Report for 12/30/2022 HBO Details Patient Name: Date of Service: [name], [name] 12/30/2022 7:20 A M Medical Record Number: [id number] Patient Account Number: [id number] Date of Birth/Sex: Treating RN: 1940/02/01 (82 y.o. M) Primary Care [name]: [name] Other Clinician: Referring [name]: Treating [name]/Extender: [name] in Treatment: 7 HBO Treatment Course Details Treatment Course Number: 1 Ordering [name]: [name] T Treatments Ordered: otal 40 HBO Treatment Start Date: 11/11/2022 HBO Indication: Late Effect of Radiation HBO Treatment Details Treatment Number: 32 Patient Type: Outpatient Chamber Type: Monoplace Chamber Serial #: [id number] Treatment Protocol: 2.0 ATA with 90 minutes oxygen, and no air breaks Treatment Details Compression Rate Down: 2.0 psi / minute De-Compression Rate Up: 2.0 psi / minute Air breaks and breathing Decompress Decompress Compress Tx Pressure Begins Reached periods Begins Ends (leave unused spaces blank) Chamber Pressure (ATA 1 2 ------2 1 ) Clock Time (24 hr) 8:41 8:53 - - - - - - 10:23 10:30 Treatment Length: 109 (minutes) Treatment Segments: 4 Vital Signs Capillary Blood Glucose Reference Range: 80 - 120 mg / dl HBO Diabetic Blood Glucose Intervention Range: <131 mg/dl or >401 mg/dl Type: Time Vitals Blood Pulse: Respiratory Temperature: Capillary Blood Glucose Pulse Action Taken: Pressure: Rate: Glucose (mg/dl): Meter #: Oximetry (%) Taken: Pre 07:39 100/60 64 18 97.1 none per protocol Post 10:31 133/64 52 18 97.3 none per protocol Pre 08:16 108/62 52 97.5 informed physician of patient's symptoms Treatment Response Treatment Toleration: Well Treatment Completion Status: Treatment Completed without Adverse Event Treatment Notes Before we started patient was sick to stomach and waited in restroom  before we started. [name] aware I certify that I directed and performed operation of said chamber for this treatment. [name] [name] arrived with normal vital signs. After changing/preparing for treatment, patient had a bout of nausea with sweating. Vital signs taken while patient was recovering were within normal range except heart rate of 52 bpm. Patient denied any of the symptoms of bradycardia. After a short time patient stated that he thought he could go ahead and do the treatment. [name] informed of patient's symptoms and vital signs and approval was granted for patient to undergo treatment. After performing a safety check, patient was placed in the chamber which was compressed with 100% oxygen at a rate of 2 psi/min after confirming normal ear equalization. He tolerated the treatment and subsequent decompression at the rate of 2 psi/min. Is post treatment vital signs were within normal range with the same heart rate of 52 bpm denying any symptoms and appearing asymptomatic for symptoms of poor circulation due to bradycardia. He was stable upon discharge. [name] Notes Some nausea issues prior to treatment. I did not get to see the patient. As far as my mom I am aware he tolerated hyperbarics well Physician HBO Attestation: I certify that I supervised this HBO treatment in accordance with Medicare guidelines. A trained emergency response team is readily available per Yes [name], [name] ([id number]) [id number] Page 2 of 3 hospital policies and procedures. Continue HBOT as ordered. Yes Electronic Signature(s) Signed: 12/30/2022 5:24:09 PM By: [name] Najjar MD Previous Signature: 12/30/2022 4:56:04 PM Version By: [name] CHT EMT BS , , Entered By: [name] on 12/30/2022 14:14:39 -------------------------------------------------------------------------------- HBO Safety Checklist Details Patient Name: Date of Service: [name]. 12/30/2022 7:20 A M Medical Record Number: [id number] Patient Account Number: [id number] Date of Birth/Sex: Treating RN: 1940-09-23 (82 y.o.  M) Primary Care [name]: [name] Other Clinician: Referring [name]: Treating [name]/Extender: [name] in Treatment: 7 HBO Safety Checklist Items Safety Checklist Consent Form Signed Patient voided / foley secured and emptied When did you last eato 6:30 sausage egg water Last dose of injectable or oral agent Ostomy pouch emptied and vented if applicable NA All implantable devices assessed, documented and approved NA Intravenous access site secured and place NA Valuables secured Linens and cotton and cotton/polyester blend (less than 51% polyester) Personal oil-based products / skin lotions / body lotions removed Wigs or hairpieces removed NA Smoking or tobacco materials removed NA Books / newspapers / magazines / loose paper removed Cologne, aftershave, perfume and deodorant removed Jewelry removed (may wrap wedding band) Make-up removed NA Hair care products removed NA Battery operated devices (external) removed Heating patches and chemical warmers removed NA Titanium eyewear removed NA Nail polish cured greater than 10 hours NA Casting material cured greater than 10 hours NA Hearing aids removed NA Loose dentures or partials removed NA Prosthetics have been removed NA Patient demonstrates correct use of air break device (if applicable) Patient concerns have been addressed Patient grounding bracelet on and cord attached to chamber Specifics for Inpatients (complete in addition to above) Medication sheet sent with patient NA Intravenous medications needed or due during therapy sent with patient NA Drainage tubes (e.g. nasogastric tube or chest tube secured and vented) NA Endotracheal or Tracheotomy tube secured NA Cuff deflated of air and inflated with saline NA Airway  suctioned NA Notes Paper version used prior to treatment start. Electronic Signature(s) Signed: 01/27/2023 3:37:49 PM By: [name], [name] ([id number]) [id number] Page 3 of 3 Entered By: [name] on 12/30/2022 08:12:04

## 2023-01-27 NOTE — Progress Notes (Signed)
[name], [name] ([id number]) [id number] Page 1 of 2 Visit Report for 01/02/2023 HBO Details Patient Name: Date of Service: [name], [name] 01/02/2023 7:20 A M Medical Record Number: [id number] Patient Account Number: [id number] Date of Birth/Sex: Treating RN: July 27, 1940 (82 y.o. M) Primary Care Traxton Kolenda: Charissa Bash Other Clinician: Referring Erryn Dickison: Treating Lauriana Denes/Extender: Morene Antu in Treatment: 8 HBO Treatment Course Details Treatment Course Number: 1 Ordering Cherith Tewell: Lenda Kelp T Treatments Ordered: otal 40 HBO Treatment Start Date: 11/11/2022 HBO Indication: Late Effect of Radiation HBO Treatment Details Treatment Number: 35 Patient Type: Outpatient Chamber Type: Monoplace Chamber Serial #: [id number] Treatment Protocol: 2.0 ATA with 90 minutes oxygen, and no air breaks Treatment Details Compression Rate Down: 2.0 psi / minute De-Compression Rate Up: 2.0 psi / minute Air breaks and breathing Decompress Decompress Compress Tx Pressure Begins Reached periods Begins Ends (leave unused spaces blank) Chamber Pressure (ATA 1 2 ------2 1 ) Clock Time (24 hr) 7:57 8:09 - - - - - - 9:39 9:48 Treatment Length: 111 (minutes) Treatment Segments: 4 Vital Signs Capillary Blood Glucose Reference Range: 80 - 120 mg / dl HBO Diabetic Blood Glucose Intervention Range: <131 mg/dl or >086 mg/dl Time Vitals Blood Respiratory Capillary Blood Glucose Pulse Action Type: Pulse: Temperature: Taken: Pressure: Rate: Glucose (mg/dl): Meter #: Oximetry (%) Taken: Pre 07:45 88/72 60 18 97.6 Post 09:50 147/57 48 18 97.3 Treatment Response Treatment Toleration: Well Treatment Completion Status: Treatment Completed without Adverse Event Jarry Manon Notes No concerns with treatment given. Physician HBO Attestation: I certify that I supervised this HBO treatment in accordance with Medicare guidelines. A trained emergency  response team is readily available per Yes hospital policies and procedures. Continue HBOT as ordered. Yes Electronic Signature(s) Signed: 01/02/2023 5:41:20 PM By: Baltazar Najjar MD Entered By: Baltazar Najjar on 01/02/2023 14:34:57 [name] ([id number]) [id number] Page 2 of 2 -------------------------------------------------------------------------------- HBO Safety Checklist Details Patient Name: Date of Service: [name], [name] 01/02/2023 7:20 A M Medical Record Number: [id number] Patient Account Number: [id number] Date of Birth/Sex: Treating RN: [birth year] (82 y.o. M) Primary Care Oaklee Sunga: Charissa Bash Other Clinician: Referring Namine Beahm: Treating Anari Evitt/Extender: Morene Antu in Treatment: 8 HBO Safety Checklist Items Safety Checklist Consent Form Signed Patient voided / foley secured and emptied When did you last eato 6:50 breakfast Last dose of injectable or oral agent Ostomy pouch emptied and vented if applicable NA All implantable devices assessed, documented and approved NA Intravenous access site secured and place NA Valuables secured Linens and cotton and cotton/polyester blend (less than 51% polyester) Personal oil-based products / skin lotions / body lotions removed Wigs or hairpieces removed NA Smoking or tobacco materials removed NA Books / newspapers / magazines / loose paper removed Cologne, aftershave, perfume and deodorant removed Jewelry removed (may wrap wedding band) Make-up removed NA Hair care products removed Battery operated devices (external) removed NA Heating patches and chemical warmers removed NA Titanium eyewear removed NA Nail polish cured greater than 10 hours NA Casting material cured greater than 10 hours NA Hearing aids removed Loose dentures or partials removed Prosthetics have been removed NA Patient demonstrates correct use of air break device (if  applicable) Patient concerns have been addressed Patient grounding bracelet on and cord attached to chamber Specifics for Inpatients (complete in addition to above) Medication sheet sent with patient NA Intravenous medications needed or due during therapy sent with patient NA Drainage tubes (e.g. nasogastric tube or chest tube secured and vented) NA  Endotracheal or Tracheotomy tube secured NA Cuff deflated of air and inflated with saline NA Airway suctioned NA Notes Paper version used prior to treatment start. Electronic Signature(s) Signed: 01/27/2023 3:37:49 PM By: Demetria Pore Entered By: Demetria Pore on 01/02/2023 07:19:16

## 2023-01-27 NOTE — Progress Notes (Signed)
SENG, POLISHCHUK ([id number]) [id number] Page 1 of 2 Visit Report for 12/30/2022 Arrival Information Details Patient Name: Date of Service: [name], [name] 12/30/2022 7:20 A M Medical Record Number: [id number] Patient Account Number: [id number] Date of Birth/Sex: Treating RN: Apr 17, 1940 (82 y.o. M) Primary Care Azeneth Carbonell: Charissa Bash Other Clinician: Referring Tammie Yanda: Treating Salwa Bai/Extender: Morene Antu in Treatment: 7 Visit Information History Since Last Visit Added or deleted any medications: No Patient Arrived: Ambulatory Any new allergies or adverse reactions: No Arrival Time: 07:39 Had a fall or experienced change in No Accompanied By: self activities of daily living that may affect Transfer Assistance: None risk of falls: Patient Requires Transmission-Based Precautions: No Signs or symptoms of abuse/neglect since last visito No Patient Has Alerts: No Hospitalized since last visit: No Implantable device outside of the clinic excluding No cellular tissue based products placed in the center since last visit: Pain Present Now: No Electronic Signature(s) Signed: 01/27/2023 3:37:49 PM By: Demetria Pore Entered By: Demetria Pore on 12/30/2022 08:11:55 -------------------------------------------------------------------------------- Encounter Discharge Information Details Patient Name: Date of Service: [name]. 12/30/2022 7:20 A M Medical Record Number: [id number] Patient Account Number: [id number] Date of Birth/Sex: Treating RN: 1940-02-20 (82 y.o. [name] Primary Care Nollie Terlizzi: Charissa Bash Other Clinician: Daryll Brod Referring Ulanda Tackett: Treating Seraphim Trow/Extender: Morene Antu in Treatment: 7 Encounter Discharge Information Items Discharge Condition: Stable Ambulatory Status: Ambulatory Discharge Destination: Home Transportation: Private Auto Accompanied By:  self Schedule Follow-up Appointment: Yes Clinical Summary of Care: Electronic Signature(s) Signed: 12/30/2022 5:01:12 PM By: Haywood Pao CHT EMT BS , , Entered By: Haywood Pao on 12/30/2022 14:01:12 [name] ([id number]) [id number] Page 2 of 2 -------------------------------------------------------------------------------- Vitals Details Patient Name: Date of Service: [name], [name] 12/30/2022 7:20 A M Medical Record Number: [id number] Patient Account Number: [id number] Date of Birth/Sex: Treating RN: 10-Aug-1940 (82 y.o. M) Primary Care Bexleigh Theriault: Charissa Bash Other Clinician: Referring Tieler Cournoyer: Treating Baudelio Karnes/Extender: Morene Antu in Treatment: 7 Vital Signs Time Taken: 07:39 Temperature (F): 97.1 Height (in): 71 Pulse (bpm): 64 Weight (lbs): 180 Respiratory Rate (breaths/min): 18 Body Mass Index (BMI): 25.1 Blood Pressure (mmHg): 100/60 Reference Range: 80 - 120 mg / dl Electronic Signature(s) Signed: 01/27/2023 3:37:49 PM By: Demetria Pore Entered By: Demetria Pore on 12/30/2022 08:11:58

## 2023-01-27 NOTE — Progress Notes (Signed)
[name], [name] ([id number]) [id number] Page 1 of 2 Visit Report for 01/02/2023 Arrival Information Details Patient Name: Date of Service: [name], [name] 01/02/2023 7:20 A M Medical Record Number: [id number] Patient Account Number: [id number] Date of Birth/Sex: Treating RN: 01-Nov-1940 (82 y.o. M) Primary Care Renezmae Canlas: Charissa Bash Other Clinician: Referring Issaiah Seabrooks: Treating Jaylnn Ullery/Extender: Morene Antu in Treatment: 8 Visit Information History Since Last Visit Added or deleted any medications: No Patient Arrived: Ambulatory Any new allergies or adverse reactions: No Arrival Time: 07:30 Had a fall or experienced change in No Accompanied By: self activities of daily living that may affect Transfer Assistance: None risk of falls: Patient Identification Verified: Yes Signs or symptoms of abuse/neglect since last visito No Secondary Verification Process Completed: Yes Hospitalized since last visit: No Patient Requires Transmission-Based Precautions: No Implantable device outside of the clinic excluding No Patient Has Alerts: No cellular tissue based products placed in the center since last visit: Pain Present Now: No Electronic Signature(s) Signed: 01/27/2023 3:37:49 PM By: Demetria Pore Entered By: Demetria Pore on 01/02/2023 07:18:34 -------------------------------------------------------------------------------- Encounter Discharge Information Details Patient Name: Date of Service: [name]. 01/02/2023 7:20 A M Medical Record Number: [id number] Patient Account Number: [id number] Date of Birth/Sex: Treating RN: May 04, 1940 (82 y.o. [name] Primary Care Selmer Adduci: Charissa Bash Other Clinician: Daryll Brod Referring Prima Rayner: Treating Aideen Fenster/Extender: Morene Antu in Treatment: 8 Encounter Discharge Information Items Discharge Condition: Stable Ambulatory Status:  Ambulatory Discharge Destination: Home Transportation: Private Auto Accompanied By: self Schedule Follow-up Appointment: No Clinical Summary of Care: Electronic Signature(s) Signed: 01/27/2023 3:37:49 PM By: Demetria Pore Entered By: Demetria Pore on 01/02/2023 07:20:59 [name] ([id number]) [id number] Page 2 of 2 -------------------------------------------------------------------------------- Vitals Details Patient Name: Date of Service: [name], [name] 01/02/2023 7:20 A M Medical Record Number: [id number] Patient Account Number: [id number] Date of Birth/Sex: Treating RN: 06-07-1940 (82 y.o. M) Primary Care Chari Parmenter: Charissa Bash Other Clinician: Referring Heydi Swango: Treating Oluwadara Gorman/Extender: Morene Antu in Treatment: 8 Vital Signs Time Taken: 07:45 Temperature (F): 97.6 Height (in): 71 Pulse (bpm): 60 Weight (lbs): 180 Respiratory Rate (breaths/min): 18 Body Mass Index (BMI): 25.1 Blood Pressure (mmHg): 88/72 Reference Range: 80 - 120 mg / dl Electronic Signature(s) Signed: 01/27/2023 3:37:49 PM By: Demetria Pore Entered By: Demetria Pore on 01/02/2023 07:19:10

## 2023-02-05 ENCOUNTER — Ambulatory Visit: Payer: Medicare HMO

## 2023-02-05 DIAGNOSIS — Z0000 Encounter for general adult medical examination without abnormal findings: Secondary | ICD-10-CM

## 2023-02-05 NOTE — Patient Instructions (Signed)
 [name] , Thank you for taking time to come for your Medicare Wellness Visit. I appreciate your ongoing commitment to your health goals. Please review the following plan we discussed and let me know if I can assist you in the future.   Referrals/Orders/Follow-Ups/Clinician Recommendations: none  This is a list of the screening recommended for you and due dates:  Health Maintenance  Topic Date Due   Zoster (Shingles) Vaccine (1 of 2) 06/08/1960   Flu Shot  08/29/2022   COVID-19 Vaccine (4 - 2024-25 season) 09/29/2022   Medicare Annual Wellness Visit  02/05/2024   DTaP/Tdap/Td vaccine (3 - Td or Tdap) 06/02/2030   Pneumonia Vaccine  Completed   HPV Vaccine  Aged Out   Hepatitis C Screening  Discontinued    Advanced directives: (ACP Link)Information on Advanced Care Planning can be found at [place]  Secretary of [facility] Advance Health Care Directives Advance Health Care Directives ([email or web address]   Next Medicare Annual Wellness Visit scheduled for next year: Yes  insert Preventive Care attachment Insert FALL PREVENTION attachment if needed
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
## 2023-02-22 DIAGNOSIS — H5203 Hypermetropia, bilateral: Secondary | ICD-10-CM | POA: Diagnosis not present

## 2023-02-22 DIAGNOSIS — H524 Presbyopia: Secondary | ICD-10-CM | POA: Diagnosis not present

## 2023-03-07 ENCOUNTER — Encounter (HOSPITAL_COMMUNITY): Payer: Self-pay

## 2023-03-07 ENCOUNTER — Observation Stay (HOSPITAL_COMMUNITY)
Admission: EM | Admit: 2023-03-07 | Discharge: 2023-03-09 | Disposition: A | Discharge status: Home / Self Care | Payer: Medicare HMO | Attending: Internal Medicine | Admitting: Internal Medicine

## 2023-03-07 ENCOUNTER — Other Ambulatory Visit: Payer: Self-pay

## 2023-03-07 ENCOUNTER — Emergency Department (HOSPITAL_COMMUNITY): Payer: Medicare HMO

## 2023-03-07 DIAGNOSIS — R1084 Generalized abdominal pain: Secondary | ICD-10-CM | POA: Diagnosis not present

## 2023-03-07 DIAGNOSIS — Z86718 Personal history of other venous thrombosis and embolism: Secondary | ICD-10-CM | POA: Diagnosis not present

## 2023-03-07 DIAGNOSIS — R42 Dizziness and giddiness: Secondary | ICD-10-CM | POA: Diagnosis not present

## 2023-03-07 DIAGNOSIS — K458 Other specified abdominal hernia without obstruction or gangrene: Secondary | ICD-10-CM | POA: Diagnosis not present

## 2023-03-07 DIAGNOSIS — E876 Hypokalemia: Secondary | ICD-10-CM | POA: Diagnosis not present

## 2023-03-07 DIAGNOSIS — R1033 Periumbilical pain: Principal | ICD-10-CM | POA: Insufficient documentation

## 2023-03-07 DIAGNOSIS — R001 Bradycardia, unspecified: Secondary | ICD-10-CM | POA: Diagnosis not present

## 2023-03-07 DIAGNOSIS — R109 Unspecified abdominal pain: Secondary | ICD-10-CM | POA: Diagnosis present

## 2023-03-07 DIAGNOSIS — R112 Nausea with vomiting, unspecified: Secondary | ICD-10-CM | POA: Diagnosis not present

## 2023-03-07 DIAGNOSIS — K828 Other specified diseases of gallbladder: Secondary | ICD-10-CM | POA: Diagnosis not present

## 2023-03-07 DIAGNOSIS — N281 Cyst of kidney, acquired: Secondary | ICD-10-CM | POA: Diagnosis not present

## 2023-03-07 DIAGNOSIS — K5730 Diverticulosis of large intestine without perforation or abscess without bleeding: Secondary | ICD-10-CM | POA: Diagnosis not present

## 2023-03-07 DIAGNOSIS — K56609 Unspecified intestinal obstruction, unspecified as to partial versus complete obstruction: Secondary | ICD-10-CM | POA: Diagnosis not present

## 2023-03-07 DIAGNOSIS — K627 Radiation proctitis: Secondary | ICD-10-CM | POA: Diagnosis not present

## 2023-03-07 DIAGNOSIS — Z8546 Personal history of malignant neoplasm of prostate: Secondary | ICD-10-CM | POA: Insufficient documentation

## 2023-03-07 DIAGNOSIS — N400 Enlarged prostate without lower urinary tract symptoms: Secondary | ICD-10-CM | POA: Diagnosis not present

## 2023-03-07 DIAGNOSIS — J45909 Unspecified asthma, uncomplicated: Secondary | ICD-10-CM | POA: Diagnosis not present

## 2023-03-07 LAB — URINALYSIS, ROUTINE W REFLEX MICROSCOPIC
Bilirubin Urine: NEGATIVE
Glucose, UA: NEGATIVE mg/dL
Ketones, ur: NEGATIVE mg/dL
Leukocytes,Ua: NEGATIVE
Nitrite: NEGATIVE
Protein, ur: 30 mg/dL — AB
Specific Gravity, Urine: 1.016 (ref 1.005–1.030)
pH: 6 (ref 5.0–8.0)

## 2023-03-07 LAB — COMPREHENSIVE METABOLIC PANEL
ALT: 12 U/L (ref 0–44)
AST: 23 U/L (ref 15–41)
Albumin: 4 g/dL (ref 3.5–5.0)
Alkaline Phosphatase: 52 U/L (ref 38–126)
Anion gap: 12 (ref 5–15)
BUN: 14 mg/dL (ref 8–23)
CO2: 24 mmol/L (ref 22–32)
Calcium: 9.5 mg/dL (ref 8.9–10.3)
Chloride: 102 mmol/L (ref 98–111)
Creatinine, Ser: 0.89 mg/dL (ref 0.61–1.24)
GFR, Estimated: >60 mL/min (ref >60)
Glucose, Bld: 128 mg/dL — ABNORMAL HIGH (ref 70–99)
Potassium: 3.3 mmol/L — ABNORMAL LOW (ref 3.5–5.1)
Sodium: 138 mmol/L (ref 135–145)
Total Bilirubin: 0.4 mg/dL (ref 0.0–1.2)
Total Protein: 7.5 g/dL (ref 6.5–8.1)

## 2023-03-07 LAB — MAGNESIUM: Magnesium: 1.8 mg/dL (ref 1.7–2.4)

## 2023-03-07 LAB — PHOSPHORUS: Phosphorus: 3.4 mg/dL (ref 2.5–4.6)

## 2023-03-07 LAB — CBC
HCT: 35.2 % — ABNORMAL LOW (ref 39.0–52.0)
Hemoglobin: 11.8 g/dL — ABNORMAL LOW (ref 13.0–17.0)
MCH: 28.3 pg (ref 26.0–34.0)
MCHC: 33.5 g/dL (ref 30.0–36.0)
MCV: 84.4 fL (ref 80.0–100.0)
Platelets: 250 10*3/uL (ref 150–400)
RBC: 4.17 MIL/uL — ABNORMAL LOW (ref 4.22–5.81)
RDW: 13.3 % (ref 11.5–15.5)
WBC: 6.5 10*3/uL (ref 4.0–10.5)
nRBC: 0 % (ref 0.0–0.2)

## 2023-03-07 LAB — LACTIC ACID, PLASMA: Lactic Acid, Venous: 1.7 mmol/L (ref 0.5–1.9)

## 2023-03-07 LAB — LIPASE, BLOOD: Lipase: 29 U/L (ref 11–51)

## 2023-03-07 MED ORDER — ONDANSETRON HCL 4 MG PO TABS: 4.0000 mg | ORAL_TABLET | Freq: Four times a day (QID) | ORAL | Status: DC | PRN

## 2023-03-07 MED ORDER — ONDANSETRON HCL 4 MG/2ML IJ SOLN
4.0000 mg | Freq: Once | INTRAMUSCULAR | Status: AC
  Administered 2023-03-07: 4 mg via INTRAVENOUS
  Filled 2023-03-07: qty 2

## 2023-03-07 MED ORDER — SODIUM CHLORIDE 0.9 % IV BOLUS
1000.0000 mL | Freq: Once | INTRAVENOUS | Status: AC
  Administered 2023-03-07: 1000 mL via INTRAVENOUS

## 2023-03-07 MED ORDER — ONDANSETRON HCL 4 MG/2ML IJ SOLN: 4.0000 mg | Freq: Four times a day (QID) | INTRAMUSCULAR | Status: DC | PRN

## 2023-03-07 MED ORDER — ACETAMINOPHEN 325 MG PO TABS
650.0000 mg | ORAL_TABLET | Freq: Four times a day (QID) | ORAL | Status: DC | PRN
  Administered 2023-03-07: 650 mg via ORAL
  Filled 2023-03-07: qty 2

## 2023-03-07 MED ORDER — POTASSIUM CHLORIDE 10 MEQ/100ML IV SOLN
10.0000 meq | INTRAVENOUS | Status: AC
  Administered 2023-03-07 (×3): 10 meq via INTRAVENOUS
  Filled 2023-03-07 (×3): qty 100

## 2023-03-07 MED ORDER — HYDROMORPHONE HCL 1 MG/ML IJ SOLN
0.5000 mg | Freq: Four times a day (QID) | INTRAMUSCULAR | Status: DC | PRN
  Administered 2023-03-07: 0.5 mg via INTRAVENOUS
  Filled 2023-03-07: qty 1

## 2023-03-07 MED ORDER — MORPHINE SULFATE (PF) 4 MG/ML IV SOLN
4.0000 mg | Freq: Once | INTRAVENOUS | Status: AC
  Administered 2023-03-07: 4 mg via INTRAVENOUS
  Filled 2023-03-07: qty 1

## 2023-03-07 MED ORDER — SENNOSIDES-DOCUSATE SODIUM 8.6-50 MG PO TABS: 1.0000 | ORAL_TABLET | Freq: Every evening | ORAL | Status: DC | PRN

## 2023-03-07 MED ORDER — FENTANYL CITRATE PF 50 MCG/ML IJ SOSY
50.0000 ug | PREFILLED_SYRINGE | Freq: Once | INTRAMUSCULAR | Status: AC
  Administered 2023-03-07: 50 ug via INTRAVENOUS
  Filled 2023-03-07: qty 1

## 2023-03-07 MED ORDER — IOHEXOL 300 MG/ML  SOLN
100.0000 mL | Freq: Once | INTRAMUSCULAR | Status: AC | PRN
  Administered 2023-03-07: 100 mL via INTRAVENOUS

## 2023-03-07 MED ORDER — ACETAMINOPHEN 650 MG RE SUPP: 650.0000 mg | Freq: Four times a day (QID) | RECTAL | Status: DC | PRN

## 2023-03-07 NOTE — Consult Note (Signed)
 Consult Note  [name] 08-28-1940  [id number].    Requesting MD: Dr. Nicholaus Chief Complaint/Reason for Consult: abdominal pain  HPI:  83 y.o. male with medical history significant for HTN, HLD, OA, gout, diverticulitis, BPH, prostate cancer, radiation hematuria/cystitis who presented to [facility] ED with abdominal pain. Pain began yesterday after eating cole slaw and a fish sandwich and was located periumbilical. He has had similar pain after eating over the past year but usually it self resolved in 10 minutes. This episode was more severe and persistent prompting his presentation to the ED. He has had associated nausea, vomiting, and diarrhea. Last emesis was prior to arrival to ED. Since arrival pain is almost resolved. He is no longer nauseas. He states he has not been taking any chronic medications in the recent past.   Work up in ED significant for CT scan showing suggestion of mesenteric/small-bowel swirling in the right abdomen. A similar pattern was seen on previous ct scans from 2024 and 2023.  Substance use: denies Allergies: nkda Blood thinners: none (prev on anticoag for h/o dvt/pe but has been off of it for several years) Past Surgeries: denies prior abdominal surgeries  ROS: Reviewed and as above  Family History  Problem Relation Age of Onset   Heart disease Father    Stroke Father    Heart disease Brother 53    Past Medical History:  Diagnosis Date   Benign localized prostatic hyperplasia with lower urinary tract symptoms (LUTS)    Chronic gout    followed by pcp   Full dentures    History of colitis 2005   per colonoscopy chronic inflammation colits secondary to ascending colon ulceration   History of diverticulitis of colon    History of DVT of lower extremity 09/13/2006   LLE   History of pulmonary embolism 09/13/2006   bilateral PE secondary to DVT of LLE in 2008 On Warfarin until 01/2010 when he was admitted for syncope and noticed that he had  completed course of warfarin and it was stopped.   HTN (hypertension)    followed by pcp   (05-12-2023no medication since 01/ 2023 by pt's pcp)   Hx of acute blood loss anemia due to radiation proctitis, hg normalized after transfusion 07/16/2021   Hx of aortic root dilation (HCC), resolved 01/10/2012   2012: Left ventricle: no LVH.  EF 60% to 65%.  Aorta: Mildly dilated aortic root. Aortic root dimension: 39mm  2015 : Aortic root: The aortic root was top normal in size.  2015 : Stable diffuse thoracic aortic ectasia with maximum diameter of the ascending aorta to 4.6 cm.  2017 ECHO ascending aorta nl in size     Hyperlipidemia    Malignant neoplasm prostate (HCC) 01/2021   urologist-- dr bell;  dx 01/ 2023, Gleason 4+4   Mild intermittent asthma    PFT's 05/2012 : FEV1 / FVC 75 and 78 pre and post BD. FEV1 74 and 75 pre and post BD. TLC 72. RV 93. DLCO 79. Read as restrictive lung disease, possible with obstructive lung disease. Prior smoker.     Mild intermittent asthma, uncomplicated 07/13/2013   PFT's 05/2012 : FEV1 / FVC 75 and 78 pre and post BD. FEV1 74 and 75 pre and post BD. TLC 72. RV 93. DLCO 79. Read as restrictive lung disease, possible with obstructive lung disease.  Prior smoker.    OA (osteoarthritis) of knee 11/05/2013   Radiation proctitis 07/28/2021   Marked  showed marked rectal thickening and perirectal stranding, findings compatible with radiation proctitis. His blood and urine culture showed no growth. He received Vanco, cefepime   and Flagyl  during hospitalization which was changed to Rocephin , and Flagyl . He was discharged on Omnicef  and Flagyl  to cover proctitis for 10 days for a total 14 days.    Recurrent gross hematuria due to radiation cystitis 10/03/2022   Syncope 07/16/2021    Past Surgical History:  Procedure Laterality Date   GOLD SEED IMPLANT N/A 06/13/2021   Procedure: GOLD SEED IMPLANT;  Surgeon: Carolee Sherwood JONETTA DOUGLAS, MD;  Location: [facility];   Service: Urology;  Laterality: N/A;   I & D EXTREMITY Right 03/04/2014   Procedure: IRRIGATION AND DEBRIDEMENT FINGER / HAND;  Surgeon: Balinda Rogue, MD;  Location: [facility] OR;  Service: Plastics;  Laterality: Right;  I&D Right Small Finger and Right Wrist   KNEE ARTHROSCOPY Left 2000   SPACE OAR INSTILLATION N/A 06/13/2021   Procedure: SPACE OAR INSTILLATION;  Surgeon: Carolee Sherwood JONETTA DOUGLAS, MD;  Location: [facility];  Service: Urology;  Laterality: N/A;   TRANSURETHRAL RESECTION OF PROSTATE  04/01/2003   @[facility]   by dr andra    Social History:  reports that he has never smoked. He has never used smokeless tobacco. He reports that he does not drink alcohol and does not use drugs.  Allergies: No Known Allergies  (Not in a hospital admission)   Blood pressure 133/67, pulse (!) 55, temperature (!) 97.5 F (36.4 C), temperature source Oral, resp. rate 12, height 5' 11 (1.803 m), weight 81.6 kg, SpO2 97%. Physical Exam: General: pleasant, WD, male who is laying in bed in NAD HEENT: head is normocephalic, atraumatic.  Sclera are noninjected.  Pupils equal and round. EOMs intact.  Ears and nose without any masses or lesions.  Mouth is pink and moist Heart:  Palpable radial and pedal pulses bilaterally Lungs: Respiratory effort nonlabored Abd: soft, NT, ND, no masses, hernias, or organomegaly MSK: all 4 extremities are symmetrical with no cyanosis, clubbing, or edema. Skin: warm and dry with no masses, lesions, or rashes Neuro: Cranial nerves 2-12 grossly intact, sensation is normal throughout Psych: A&Ox3 with an appropriate affect.    Results for orders placed or performed during the hospital encounter of 03/07/23 (from the past 48 hours)  Urinalysis, Routine w reflex microscopic -Urine, Clean Catch     Status: Abnormal   Collection Time: 03/07/23  5:15 AM  Result Value Ref Range   Color, Urine YELLOW YELLOW   APPearance CLEAR CLEAR   Specific Gravity, Urine 1.016 1.005 -  1.030   pH 6.0 5.0 - 8.0   Glucose, UA NEGATIVE NEGATIVE mg/dL   Hgb urine dipstick SMALL (A) NEGATIVE   Bilirubin Urine NEGATIVE NEGATIVE   Ketones, ur NEGATIVE NEGATIVE mg/dL   Protein, ur 30 (A) NEGATIVE mg/dL   Nitrite NEGATIVE NEGATIVE   Leukocytes,Ua NEGATIVE NEGATIVE   RBC / HPF 6-10 0 - 5 RBC/hpf   WBC, UA 0-5 0 - 5 WBC/hpf   Bacteria, UA RARE (A) NONE SEEN   Squamous Epithelial / HPF 0-5 0 - 5 /HPF   Mucus PRESENT     Comment: Performed at [facility], [address], [place], [place] [address]  Lipase, blood     Status: None   Collection Time: 03/07/23  5:18 AM  Result Value Ref Range   Lipase 29 11 - 51 U/L    Comment: Performed at [employer] [facility], [address] [place], [place] [address]  Comprehensive metabolic panel     Status: Abnormal   Collection Time: 03/07/23  5:18 AM  Result Value Ref Range   Sodium 138 135 - 145 mmol/L   Potassium 3.3 (L) 3.5 - 5.1 mmol/L   Chloride 102 98 - 111 mmol/L   CO2 24 22 - 32 mmol/L   Glucose, Bld 128 (H) 70 - 99 mg/dL    Comment: Glucose reference range applies only to samples taken after fasting for at least 8 hours.   BUN 14 8 - 23 mg/dL   Creatinine, Ser 9.10 0.61 - 1.24 mg/dL   Calcium 9.5 8.9 - 89.6 mg/dL   Total Protein 7.5 6.5 - 8.1 g/dL   Albumin 4.0 3.5 - 5.0 g/dL   AST 23 15 - 41 U/L   ALT 12 0 - 44 U/L   Alkaline Phosphatase 52 38 - 126 U/L   Total Bilirubin 0.4 0.0 - 1.2 mg/dL   GFR, Estimated >39 >39 mL/min    Comment: (NOTE) Calculated using the CKD-EPI Creatinine Equation (2021)    Anion gap 12 5 - 15    Comment: Performed at [facility], [address] [place], [place] [address]  CBC     Status: Abnormal   Collection Time: 03/07/23  5:18 AM  Result Value Ref Range   WBC 6.5 4.0 - 10.5 K/uL   RBC 4.17 (L) 4.22 - 5.81 MIL/uL   Hemoglobin 11.8 (L) 13.0 - 17.0 g/dL   HCT 64.7 (L) 60.9 - 47.9 %   MCV 84.4 80.0 - 100.0 fL   MCH 28.3 26.0 - 34.0 pg    MCHC 33.5 30.0 - 36.0 g/dL   RDW 86.6 88.4 - 84.4 %   Platelets 250 150 - 400 K/uL   nRBC 0.0 0.0 - 0.2 %    Comment: Performed at [facility], [address] [place], [place] [address]  Lactic acid, plasma     Status: None   Collection Time: 03/07/23  8:53 AM  Result Value Ref Range   Lactic Acid, Venous 1.7 0.5 - 1.9 mmol/L    Comment: Performed at [facility], [address] [place], [place] [address]   CT ABDOMEN PELVIS W CONTRAST Result Date: 03/07/2023 CLINICAL DATA:  Abdominal pain.  Nausea vomiting. EXAM: CT ABDOMEN AND PELVIS WITH CONTRAST TECHNIQUE: Multidetector CT imaging of the abdomen and pelvis was performed using the standard protocol following bolus administration of intravenous contrast. RADIATION DOSE REDUCTION: This exam was performed according to the departmental dose-optimization program which includes automated exposure control, adjustment of the mA and/or kV according to patient size and/or use of iterative reconstruction technique. CONTRAST:  OMNIPAQUE  IOHEXOL  300 MG/ML  SOLN COMPARISON:  08/20/2022 FINDINGS: Lower chest: No acute findings. Hepatobiliary: No suspicious focal abnormality within the liver parenchyma. Gallbladder is distended without evidence for gallstones, gallbladder wall thickening, or pericholecystic fluid. No intrahepatic or extrahepatic biliary dilation. Pancreas: No focal mass lesion. No dilatation of the main duct. No intraparenchymal cyst. No peripancreatic edema. Spleen: No splenomegaly. No suspicious focal mass lesion. Adrenals/Urinary Tract: No adrenal nodule or mass. 3.8 cm cyst in the lower pole right kidney is similar to prior study and comparing back to 07/16/2021. 5.2 cm cyst in the interpolar left kidney has increased from 4.4 cm on the 2023 exam. Lateral to this cyst is a second cyst with higher density measuring 2.4 cm, slightly decreased from 2.7 cm  on the 2023 exam, suggestive of benign  etiology such as a benign cyst complicated by proteinaceous debris or hemorrhage. Additional tiny bilateral low-density lesions in both kidneys are too small to characterize but statistically most likely benign. No follow-up imaging of these renal lesions specifically recommended. No evidence for hydroureter. Bladder is nondistended which likely accentuates wall thickness although ill definition of the bladder wall with this apparent thickening raises the question of cystitis. Stomach/Bowel: Stomach is unremarkable. No gastric wall thickening. No evidence of outlet obstruction. Duodenum is normally positioned as is the ligament of Treitz. No small bowel wall thickening. No small bowel dilatation. There is a suggestion of mesenteric/small-bowel swirling in the right abdomen, more prominent than on the most recent comparison but evident on the prior CT from 07/16/2021. There may be some trace edema in this mesentery of the right abdomen and upper normal lymph nodes in this region are more pronounced than on the prior 2 studies. There is no overt small bowel obstruction but fluid-filled short segment small bowel in the central pelvis measures up to 2.8 cm diameter. No small bowel wall thickening or small bowel pneumatosis. Terminal ileum is decompressed. The appendix is normal. No gross colonic mass. No colonic wall thickening. Diverticular changes are noted in the left colon without evidence of diverticulitis. Vascular/Lymphatic: There is mild atherosclerotic calcification of the abdominal aorta without aneurysm. No retroperitoneal lymphadenopathy. No pelvic sidewall lymphadenopathy. Reproductive: Prostate gland is enlarged.  Fiducial markers evident. Other: No intraperitoneal free fluid. Musculoskeletal: Bone mineralization appears heterogeneous in the pelvis and lower lumbar spine, similar to prior. IMPRESSION: 1. There is a suggestion of mesenteric/small-bowel swirling in the right abdomen, more prominent than on  the most recent comparison but evident although less pronounced on the prior CT from 07/16/2021. There may be some trace edema in this mesentery of the right abdomen and upper normal lymph nodes in this region are more pronounced than on the prior 2 studies. There is no overt small bowel obstruction but fluid-filled short segment small bowel in the central pelvis measures up to 2.8 cm diameter. Imaging features are nonspecific but can be seen in the setting of internal hernia or small bowel torsion. While no overtly complicating features at this time, close follow-up is warranted. Consider surgical consultation. 2. Bladder is nondistended which likely accentuates wall thickness although ill definition of the bladder wall with this apparent thickening suggests cystitis. 3. Prostatomegaly. 4. Left colonic diverticulosis without diverticulitis. 5. Additional tiny bilateral low-density lesions in both kidneys are too small to characterize but statistically most likely benign. 6.  Aortic Atherosclerosis (ICD10-I70.0). Electronically Signed   By: Camellia Candle M.D.   On: 03/07/2023 06:39      Assessment/Plan Abdominal pain, n/v/diarrhea  Patient seen and examined and relevant labs and imaging personally reviewed. I have compared CT today to CT from 2024 and 2023. He has had some level of focal mesenteric/small bowel swirling since 2023. WBC is normal, lactic acid is normal, no peritonitis on exam. Work up and exam is not concerning for bowel compromise or ischemia at this time. Abdominal pain almost resolved. Urgent/emergent surgical intervention is not currently indicated. Okay for Po challenge and monitor symptoms  Addendum: updated by EDP patient nauseas with PO intake. Admitting to medicine. Will follow  I reviewed last 24 h vitals and pain scores, last 48 h intake and output, last 24 h labs and trends, and last 24 h imaging results.   [place] VEAR Mais, [facility] Surgery 03/07/2023,  10:36  AM Please see Amion for pager number during day hours 7:00am-4:30pm

## 2023-03-07 NOTE — ED Notes (Signed)
 Patient transported to CT

## 2023-03-07 NOTE — H&P (Signed)
 History and Physical  [name] FMW:[id number] DOB: 1940-03-14 DOA: 03/07/2023  PCP: [name], MD   Chief Complaint: Abdominal pain  HPI: [name] is a 83 y.o. male with medical history significant for BPH, HLD, HTN, osteoarthritis, gout, diverticulitis, prostate cancer, radiation prostatitis and cystitis and asthma who presented to the ED for evaluation of abdominal pain. Patient reports he has had intermittent periumbilical pain over the last year but usually the pain usually resolves 5 to 10 minutes. Yesterday, he ate some coleslaw for a sandwich and overnight, he started having similar pain but the pain did not resolve by usual prompting him to present to the ED. He reports associated nausea and vomiting with nonbilious nonbloody emesis 2 hours prior to arrival.  Last bowel movement was prior to EMS arrival last night. The pain has improved to's 7/10 from a 10/10 at worst but he continues to feel slightly nauseous. A PO challenge was attempted in the ED but patient reports he was able to keep some of the food down but became sick on his stomach. He denies any constipation, diarrhea, fevers, chills, dizziness, headache, dysuria, bloody stools or hematuria.  Patient started having some cramping during my evaluation this resolved in less than 1 minute.  ED Course: Initial vitals showed temp 97.5, RR 17, HR 48, BP 158/71, SpO2 100% on room air.  Labs significant for K+ of 3.3, normal lipase, white count, kidney function, UA and lactic acid.  CT A/P shows mesenteric/small bowel swirling in the right abdomen concerning for internal hernia or small bowel torsion but no overt SBO. Patient received IV Zofran  4 mg x2, IV morphine  4 mg x1 and IV fentanyl  50 mcg x1. General surgery consulted for evaluation. TRH consulted for admission.  Review of Systems: Please see HPI for pertinent positives and negatives. A complete 10 system review of systems are otherwise negative.  Past Medical  History:  Diagnosis Date   Benign localized prostatic hyperplasia with lower urinary tract symptoms (LUTS)    Chronic gout    followed by pcp   Full dentures    History of colitis 2005   per colonoscopy chronic inflammation colits secondary to ascending colon ulceration   History of diverticulitis of colon    History of DVT of lower extremity 09/13/2006   LLE   History of pulmonary embolism 09/13/2006   bilateral PE secondary to DVT of LLE in 2008 On Warfarin until 01/2010 when he was admitted for syncope and noticed that he had completed course of warfarin and it was stopped.   HTN (hypertension)    followed by pcp   (05-12-2023no medication since 01/ 2023 by pt's pcp)   Hx of acute blood loss anemia due to radiation proctitis, hg normalized after transfusion 07/16/2021   Hx of aortic root dilation (HCC), resolved 01/10/2012   2012: Left ventricle: no LVH.  EF 60% to 65%.  Aorta: Mildly dilated aortic root. Aortic root dimension: 39mm  2015 : Aortic root: The aortic root was top normal in size.  2015 : Stable diffuse thoracic aortic ectasia with maximum diameter of the ascending aorta to 4.6 cm.  2017 ECHO ascending aorta nl in size     Hyperlipidemia    Malignant neoplasm prostate (HCC) 01/2021   urologist-- dr bell;  dx 01/ 2023, Gleason 4+4   Mild intermittent asthma    PFT's 05/2012 : FEV1 / FVC 75 and 78 pre and post BD. FEV1 74 and 75 pre and post BD.  TLC 72. RV 93. DLCO 79. Read as restrictive lung disease, possible with obstructive lung disease. Prior smoker.     Mild intermittent asthma, uncomplicated 07/13/2013   PFT's 05/2012 : FEV1 / FVC 75 and 78 pre and post BD. FEV1 74 and 75 pre and post BD. TLC 72. RV 93. DLCO 79. Read as restrictive lung disease, possible with obstructive lung disease.  Prior smoker.    OA (osteoarthritis) of knee 11/05/2013   Radiation proctitis 07/28/2021   Marked showed marked rectal thickening and perirectal stranding, findings compatible with radiation  proctitis. His blood and urine culture showed no growth. He received Vanco, cefepime   and Flagyl  during hospitalization which was changed to Rocephin , and Flagyl . He was discharged on Omnicef  and Flagyl  to cover proctitis for 10 days for a total 14 days.    Recurrent gross hematuria due to radiation cystitis 10/03/2022   Syncope 07/16/2021   Past Surgical History:  Procedure Laterality Date   GOLD SEED IMPLANT N/A 06/13/2021   Procedure: GOLD SEED IMPLANT;  Surgeon: Carolee Sherwood JONETTA DOUGLAS, MD;  Location: [facility];  Service: Urology;  Laterality: N/A;   I & D EXTREMITY Right 03/04/2014   Procedure: IRRIGATION AND DEBRIDEMENT FINGER / HAND;  Surgeon: Balinda Rogue, MD;  Location: [facility] OR;  Service: Plastics;  Laterality: Right;  I&D Right Small Finger and Right Wrist   KNEE ARTHROSCOPY Left 2000   SPACE OAR INSTILLATION N/A 06/13/2021   Procedure: SPACE OAR INSTILLATION;  Surgeon: Carolee Sherwood JONETTA DOUGLAS, MD;  Location: [facility];  Service: Urology;  Laterality: N/A;   TRANSURETHRAL RESECTION OF PROSTATE  04/01/2003   @[facility]   by dr andra   Social History:  reports that he has never smoked. He has never used smokeless tobacco. He reports that he does not drink alcohol and does not use drugs.  No Known Allergies  Family History  Problem Relation Age of Onset   Heart disease Father    Stroke Father    Heart disease Brother 23     Prior to Admission medications   Not on File    Physical Exam: BP (!) 157/78   Pulse (!) 56   Temp 98.2 F (36.8 C) (Oral)   Resp 16   Ht 5' 11 (1.803 m)   Wt 81.6 kg   SpO2 100%   BMI 25.10 kg/m  General: Pleasant, well-appearing, well-nourished elderly man laying in bed. No acute distress. HEENT: [place]/AT. Anicteric sclera. Moist mucous membrane. CV: Bradycardic. Regular rhythm. No murmurs, rubs, or gallops. No LE edema Pulmonary: Lungs CTAB. Normal effort. No wheezing or rales. Abdominal: Soft, ND/NT. No guarding or rebound  tenderness. Normal bowel sounds. Extremities: Palpable radial and DP pulses. Normal ROM. Bunions on both feet.  Skin: Warm and dry. No obvious rash or lesions. Neuro: A&Ox3. Moves all extremities. Normal sensation to light touch. No focal deficit. Psych: Normal mood and affect          Labs on Admission:  Basic Metabolic Panel: Recent Labs  Lab 03/07/23 0518  NA 138  K 3.3*  CL 102  CO2 24  GLUCOSE 128*  BUN 14  CREATININE 0.89  CALCIUM 9.5   Liver Function Tests: Recent Labs  Lab 03/07/23 0518  AST 23  ALT 12  ALKPHOS 52  BILITOT 0.4  PROT 7.5  ALBUMIN 4.0   Recent Labs  Lab 03/07/23 0518  LIPASE 29   No results for input(s): AMMONIA in the last 168 hours. CBC:  Recent Labs  Lab 03/07/23 0518  WBC 6.5  HGB 11.8*  HCT 35.2*  MCV 84.4  PLT 250   Cardiac Enzymes: No results for input(s): CKTOTAL, CKMB, CKMBINDEX, TROPONINI in the last 168 hours. BNP (last 3 results) No results for input(s): BNP in the last 8760 hours.  ProBNP (last 3 results) No results for input(s): PROBNP in the last 8760 hours.  CBG: No results for input(s): GLUCAP in the last 168 hours.  Radiological Exams on Admission: CT ABDOMEN PELVIS W CONTRAST Result Date: 03/07/2023 CLINICAL DATA:  Abdominal pain.  Nausea vomiting. EXAM: CT ABDOMEN AND PELVIS WITH CONTRAST TECHNIQUE: Multidetector CT imaging of the abdomen and pelvis was performed using the standard protocol following bolus administration of intravenous contrast. RADIATION DOSE REDUCTION: This exam was performed according to the departmental dose-optimization program which includes automated exposure control, adjustment of the mA and/or kV according to patient size and/or use of iterative reconstruction technique. CONTRAST:  OMNIPAQUE  IOHEXOL  300 MG/ML  SOLN COMPARISON:  08/20/2022 FINDINGS: Lower chest: No acute findings. Hepatobiliary: No suspicious focal abnormality within the liver parenchyma. Gallbladder  is distended without evidence for gallstones, gallbladder wall thickening, or pericholecystic fluid. No intrahepatic or extrahepatic biliary dilation. Pancreas: No focal mass lesion. No dilatation of the main duct. No intraparenchymal cyst. No peripancreatic edema. Spleen: No splenomegaly. No suspicious focal mass lesion. Adrenals/Urinary Tract: No adrenal nodule or mass. 3.8 cm cyst in the lower pole right kidney is similar to prior study and comparing back to 07/16/2021. 5.2 cm cyst in the interpolar left kidney has increased from 4.4 cm on the 2023 exam. Lateral to this cyst is a second cyst with higher density measuring 2.4 cm, slightly decreased from 2.7 cm on the 2023 exam, suggestive of benign etiology such as a benign cyst complicated by proteinaceous debris or hemorrhage. Additional tiny bilateral low-density lesions in both kidneys are too small to characterize but statistically most likely benign. No follow-up imaging of these renal lesions specifically recommended. No evidence for hydroureter. Bladder is nondistended which likely accentuates wall thickness although ill definition of the bladder wall with this apparent thickening raises the question of cystitis. Stomach/Bowel: Stomach is unremarkable. No gastric wall thickening. No evidence of outlet obstruction. Duodenum is normally positioned as is the ligament of Treitz. No small bowel wall thickening. No small bowel dilatation. There is a suggestion of mesenteric/small-bowel swirling in the right abdomen, more prominent than on the most recent comparison but evident on the prior CT from 07/16/2021. There may be some trace edema in this mesentery of the right abdomen and upper normal lymph nodes in this region are more pronounced than on the prior 2 studies. There is no overt small bowel obstruction but fluid-filled short segment small bowel in the central pelvis measures up to 2.8 cm diameter. No small bowel wall thickening or small bowel pneumatosis.  Terminal ileum is decompressed. The appendix is normal. No gross colonic mass. No colonic wall thickening. Diverticular changes are noted in the left colon without evidence of diverticulitis. Vascular/Lymphatic: There is mild atherosclerotic calcification of the abdominal aorta without aneurysm. No retroperitoneal lymphadenopathy. No pelvic sidewall lymphadenopathy. Reproductive: Prostate gland is enlarged.  Fiducial markers evident. Other: No intraperitoneal free fluid. Musculoskeletal: Bone mineralization appears heterogeneous in the pelvis and lower lumbar spine, similar to prior. IMPRESSION: 1. There is a suggestion of mesenteric/small-bowel swirling in the right abdomen, more prominent than on the most recent comparison but evident although less pronounced on the prior CT from 07/16/2021.  There may be some trace edema in this mesentery of the right abdomen and upper normal lymph nodes in this region are more pronounced than on the prior 2 studies. There is no overt small bowel obstruction but fluid-filled short segment small bowel in the central pelvis measures up to 2.8 cm diameter. Imaging features are nonspecific but can be seen in the setting of internal hernia or small bowel torsion. While no overtly complicating features at this time, close follow-up is warranted. Consider surgical consultation. 2. Bladder is nondistended which likely accentuates wall thickness although ill definition of the bladder wall with this apparent thickening suggests cystitis. 3. Prostatomegaly. 4. Left colonic diverticulosis without diverticulitis. 5. Additional tiny bilateral low-density lesions in both kidneys are too small to characterize but statistically most likely benign. 6.  Aortic Atherosclerosis (ICD10-I70.0). Electronically Signed   By: Camellia Candle M.D.   On: 03/07/2023 06:39   EKG shows sinus bradycardia with LVH.  Assessment/Plan [name] is a 83 y.o. male with medical history significant for BPH, HLD,  HTN, osteoarthritis, gout, diverticulitis, prostate cancer, radiation prostatitis and cystitis and asthma who presented to the ED for evaluation of abdominal pain   # Periumbilical pain Patient with history of intermittent periumbilical pain secondary to radiation-related damage to his abdomen and pelvis now presenting with persistent periumbilical pain with associated nausea and vomiting.  CT A/P on admission shows mesenteric/small bowel swirling in the right abdomen more prominent compared to previous CT in 2023. There is no overt SBO however changes are concerning for possible internal hernia or small bowel torsion.  Pain has improved however patient with persistent nausea after p.o. challenge in the ED.  He is hemodynamically stable without any signs of peritonitis on exam.  Our surgery colleagues are following but no urgent surgical intervention needed at this point. -Admit to MedSurg for observation -General Surgery following, appreciate recs -Give IVNS 1 L bolus -IV Dilaudid  as needed for pain -IV/PO Zofran  as needed for nausea and vomiting  # Hypokalemia K+ slightly low at 3.3. Patient with a brief episode of leg cramps during my evaluation. -Replete with IV KCl 10 mEq x 3 hours -Check magnesium, Phos  # HTN BP initially elevated with SBP in the 150s to 180s likely in the setting of abdominal pain. SBP improved to the 120s to 150s.  Reports he has not on any BP meds at home. -CTM  DVT prophylaxis: SCDs    Code Status: Full Code  Consults called: General Surgery  Family Communication: Discussed admission with son on the phone  Severity of Illness: The appropriate patient status for this patient is OBSERVATION. Observation status is judged to be reasonable and necessary in order to provide the required intensity of service to ensure the patient's safety. The patient's presenting symptoms, physical exam findings, and initial radiographic and laboratory data in the context of their  medical condition is felt to place them at decreased risk for further clinical deterioration. Furthermore, it is anticipated that the patient will be medically stable for discharge from the hospital within 2 midnights of admission.   Level of care:  MedSurg  Lou Claretta HERO, MD 03/07/2023, 3:24 PM [facility] Pager: [phone] Isaiah [phone]   If 7PM-7AM, please contact night-coverage [email or web address] Password [email or web address]

## 2023-03-07 NOTE — ED Notes (Signed)
 Notified pharmacy, potassium IVPB out of stock in pyxis

## 2023-03-07 NOTE — ED Triage Notes (Signed)
 Pt BIBA from home w/ umbilical abdominal pain since 2030 last night. Pt endorses n/v, denies chills or fevers.  110/[id number]@RA   BGL:143

## 2023-03-07 NOTE — ED Notes (Signed)
 Gave pt a graham cracker and he was unable to tolerate it.  Stated he  "started to break out in sweats".

## 2023-03-07 NOTE — ED Provider Notes (Signed)
 [place] EMERGENCY DEPARTMENT AT [facility] Provider Note   CSN: [id number] Arrival date & time: 03/07/23  0321     History  Chief Complaint  Patient presents with   Abdominal Pain    [name] is a 83 y.o. male with PMHx OA, asthma, gout, HTN, HLD, BPH who presents to ED concerned for umbilical abdominal pain that has been constant since around 8PM last night. Patient stating that he has had this pain intermittently over the past year but pain episodes usually only last around . Also with nausea, vomiting, diarrhea - last episode of vomiting around 5 hours ago.  Denies chest pain, dyspnea, cough, diarrhea, dysuria, hematuria, hematochezia.    Abdominal Pain      Home Medications Prior to Admission medications   Not on File      Allergies    Patient has no known allergies.    Review of Systems   Review of Systems  Gastrointestinal:  Positive for abdominal pain.    Physical Exam Updated Vital Signs BP (!) 157/78   Pulse (!) 56   Temp 98.2 F (36.8 C) (Oral)   Resp 16   Ht 5' 11 (1.803 m)   Wt 81.6 kg   SpO2 100%   BMI 25.10 kg/m  Physical Exam Vitals and nursing note reviewed.  Constitutional:      General: He is not in acute distress.    Appearance: He is not ill-appearing or toxic-appearing.  HENT:     Head: Normocephalic and atraumatic.     Mouth/Throat:     Mouth: Mucous membranes are moist.     Pharynx: No posterior oropharyngeal erythema.  Eyes:     General: No scleral icterus.       Right eye: No discharge.        Left eye: No discharge.     Conjunctiva/sclera: Conjunctivae normal.  Cardiovascular:     Rate and Rhythm: Normal rate and regular rhythm.     Pulses: Normal pulses.     Heart sounds: Normal heart sounds. No murmur heard. Pulmonary:     Effort: Pulmonary effort is normal. No respiratory distress.     Breath sounds: Normal breath sounds. No wheezing, rhonchi or rales.  Abdominal:     General: Abdomen is  flat. Bowel sounds are normal. There is no distension.     Palpations: Abdomen is soft. There is no mass.     Tenderness: There is abdominal tenderness in the periumbilical area.  Musculoskeletal:     Right lower leg: No edema.     Left lower leg: No edema.  Skin:    General: Skin is warm and dry.     Findings: No rash.  Neurological:     General: No focal deficit present.     Mental Status: He is alert and oriented to person, place, and time. Mental status is at baseline.  Psychiatric:        Mood and Affect: Mood normal.     ED Results / Procedures / Treatments   Labs (all labs ordered are listed, but only abnormal results are displayed) Labs Reviewed  COMPREHENSIVE METABOLIC PANEL - Abnormal; Notable for the following components:      Result Value   Potassium 3.3 (*)    Glucose, Bld 128 (*)    All other components within normal limits  CBC - Abnormal; Notable for the following components:   RBC 4.17 (*)    Hemoglobin 11.8 (*)  HCT 35.2 (*)    All other components within normal limits  URINALYSIS, ROUTINE W REFLEX MICROSCOPIC - Abnormal; Notable for the following components:   Hgb urine dipstick SMALL (*)    Protein, ur 30 (*)    Bacteria, UA RARE (*)    All other components within normal limits  LIPASE, BLOOD  LACTIC ACID, PLASMA    EKG EKG Interpretation Date/Time:  Friday March 07 2023 05:24:07 EST Ventricular Rate:  49 PR Interval:  204 QRS Duration:  110 QT Interval:  503 QTC Calculation: 455 R Axis:   10  Text Interpretation: Sinus bradycardia Abnormal R-wave progression, early transition Left ventricular hypertrophy Borderline T abnormalities, anterior leads When compared with ECG of 10/11/2021, HEART RATE has decreased ST abnormality has improved Confirmed by Raford Lenis ([id number]) on 03/07/2023 7:00:54 AM  Radiology CT ABDOMEN PELVIS W CONTRAST Result Date: 03/07/2023 CLINICAL DATA:  Abdominal pain.  Nausea vomiting. EXAM: CT ABDOMEN AND PELVIS WITH  CONTRAST TECHNIQUE: Multidetector CT imaging of the abdomen and pelvis was performed using the standard protocol following bolus administration of intravenous contrast. RADIATION DOSE REDUCTION: This exam was performed according to the departmental dose-optimization program which includes automated exposure control, adjustment of the mA and/or kV according to patient size and/or use of iterative reconstruction technique. CONTRAST:  OMNIPAQUE  IOHEXOL  300 MG/ML  SOLN COMPARISON:  08/20/2022 FINDINGS: Lower chest: No acute findings. Hepatobiliary: No suspicious focal abnormality within the liver parenchyma. Gallbladder is distended without evidence for gallstones, gallbladder wall thickening, or pericholecystic fluid. No intrahepatic or extrahepatic biliary dilation. Pancreas: No focal mass lesion. No dilatation of the main duct. No intraparenchymal cyst. No peripancreatic edema. Spleen: No splenomegaly. No suspicious focal mass lesion. Adrenals/Urinary Tract: No adrenal nodule or mass. 3.8 cm cyst in the lower pole right kidney is similar to prior study and comparing back to 07/16/2021. 5.2 cm cyst in the interpolar left kidney has increased from 4.4 cm on the 2023 exam. Lateral to this cyst is a second cyst with higher density measuring 2.4 cm, slightly decreased from 2.7 cm on the 2023 exam, suggestive of benign etiology such as a benign cyst complicated by proteinaceous debris or hemorrhage. Additional tiny bilateral low-density lesions in both kidneys are too small to characterize but statistically most likely benign. No follow-up imaging of these renal lesions specifically recommended. No evidence for hydroureter. Bladder is nondistended which likely accentuates wall thickness although ill definition of the bladder wall with this apparent thickening raises the question of cystitis. Stomach/Bowel: Stomach is unremarkable. No gastric wall thickening. No evidence of outlet obstruction. Duodenum is normally  positioned as is the ligament of Treitz. No small bowel wall thickening. No small bowel dilatation. There is a suggestion of mesenteric/small-bowel swirling in the right abdomen, more prominent than on the most recent comparison but evident on the prior CT from 07/16/2021. There may be some trace edema in this mesentery of the right abdomen and upper normal lymph nodes in this region are more pronounced than on the prior 2 studies. There is no overt small bowel obstruction but fluid-filled short segment small bowel in the central pelvis measures up to 2.8 cm diameter. No small bowel wall thickening or small bowel pneumatosis. Terminal ileum is decompressed. The appendix is normal. No gross colonic mass. No colonic wall thickening. Diverticular changes are noted in the left colon without evidence of diverticulitis. Vascular/Lymphatic: There is mild atherosclerotic calcification of the abdominal aorta without aneurysm. No retroperitoneal lymphadenopathy. No pelvic sidewall lymphadenopathy. Reproductive: Prostate  gland is enlarged.  Fiducial markers evident. Other: No intraperitoneal free fluid. Musculoskeletal: Bone mineralization appears heterogeneous in the pelvis and lower lumbar spine, similar to prior. IMPRESSION: 1. There is a suggestion of mesenteric/small-bowel swirling in the right abdomen, more prominent than on the most recent comparison but evident although less pronounced on the prior CT from 07/16/2021. There may be some trace edema in this mesentery of the right abdomen and upper normal lymph nodes in this region are more pronounced than on the prior 2 studies. There is no overt small bowel obstruction but fluid-filled short segment small bowel in the central pelvis measures up to 2.8 cm diameter. Imaging features are nonspecific but can be seen in the setting of internal hernia or small bowel torsion. While no overtly complicating features at this time, close follow-up is warranted. Consider surgical  consultation. 2. Bladder is nondistended which likely accentuates wall thickness although ill definition of the bladder wall with this apparent thickening suggests cystitis. 3. Prostatomegaly. 4. Left colonic diverticulosis without diverticulitis. 5. Additional tiny bilateral low-density lesions in both kidneys are too small to characterize but statistically most likely benign. 6.  Aortic Atherosclerosis (ICD10-I70.0). Electronically Signed   By: Camellia Candle M.D.   On: 03/07/2023 06:39    Procedures Procedures    Medications Ordered in ED Medications  fentaNYL  (SUBLIMAZE ) injection 50 mcg (50 mcg Intravenous Given 03/07/23 0542)  ondansetron  (ZOFRAN ) injection 4 mg (4 mg Intravenous Given 03/07/23 0540)  iohexol  (OMNIPAQUE ) 300 MG/ML solution 100 mL (100 mLs Intravenous Contrast Given 03/07/23 0610)  morphine  (PF) 4 MG/ML injection 4 mg (4 mg Intravenous Given 03/07/23 0902)  ondansetron  (ZOFRAN ) injection 4 mg (4 mg Intravenous Given 03/07/23 1459)    ED Course/ Medical Decision Making/ A&P Clinical Course as of 03/07/23 1526  Fri Mar 07, 2023  1519 Here for abdominal pain. CT showing internal hernia or small bowel torsion. Surgery involved. PO challenged and was really nauseas. Admitting for potential bowel obstruction. Surgery involved.  [JR]    Clinical Course User Index [JR] Lang Norleen POUR, PA-C                                 Medical Decision Making Amount and/or Complexity of Data Reviewed Labs: ordered.  Risk Prescription drug management.    This patient presents to the ED for concern of abdominal pain, this involves an extensive number of treatment options, and is a complaint that carries with it a high risk of complications and morbidity.  The differential diagnosis includes gastroenteritis, colitis, small bowel obstruction, appendicitis, cholecystitis, pancreatitis, nephrolithiasis, UTI, pyleonephritis, testicular torsion.   Co morbidities that complicate the patient  evaluation  OA, asthma, gout, HTN, HLD, BPH   Additional history obtained:  Dr. Trudy PCP   Problem List / ED Course / Critical interventions / Medication management  Patient presented for constant umbilical abdominal pain since 8PM last night. Also with nausea, vomiting, diarrhea. No other infectious symptoms. Physical exam with some tenderness with palpating this area. Patient afebrile with stable vitals.  I Ordered, and personally interpreted labs.  CBC with mild anemia and no leukocytosis.  Lipase within normal limits.  UA with rare bacteria and small Hgb.  CMP with mild hypokalemia 3.3 but is otherwise reassuring. Imaging ordered by triaging provider including CT Abd/Pelvis with contrast: evaluate for structural/surgical etiology of patients' severe abdominal pain.  I independently visualized and interpreted imaging and I agree with  the radiologist interpretation of internal hernia or small bowel torsion. I personally viewed and interpreted the EKG/cardiac monitored which showed an underlying rhythm of: sinus rhythm Consulted with General Surgeon on-call PA Glendale Mais who recommended PO challenge - no current concern for needing emergent surgery. Patient did not tolerate PO challenge well - started having severe nausea without vomiting. 3:25PM consulted with Dr. Lou who agreed to admit patient. I have reviewed the patients home medicines and have made adjustments as needed   Social Determinants of Health:  none          Final Clinical Impression(s) / ED Diagnoses Final diagnoses:  SBO (small bowel obstruction) [facility])    Rx / DC Orders ED Discharge Orders     None         Hoy Nidia JULIANNA DEVONNA 03/07/23 1527    Nicholaus Cassondra DEL, MD 03/07/23 1538

## 2023-03-07 NOTE — ED Provider Triage Note (Signed)
 Emergency Medicine Provider Triage Evaluation Note  [name] , a 83 y.o. male  was evaluated in triage.  Pt complains of he presents emergency department with abdominal pain nausea vomiting x 8 hours.  History of intermittent periumbilical pain now constant.  .  Review of Systems  Positive: Periumbilical abdominal pain Negative: Fever  Physical Exam  BP (!) 188/86   Pulse (!) 53   Temp (!) 97.4 F (36.3 C) (Oral)   Resp 19   Ht 5' 11 (1.803 m)   Wt 81.6 kg   SpO2 100%   BMI 25.10 kg/m  Gen:   Awake, no distress   Resp:  Normal effort  MSK:   Moves extremities without difficulty  Other:  No palpable hernia.  Medical Decision Making  Medically screening exam initiated at 5:33 AM.  Appropriate orders placed.  [name] was informed that the remainder of the evaluation will be completed by another provider, this initial triage assessment does not replace that evaluation, and the importance of remaining in the ED until their evaluation is complete.  Labs and imaging ordered.   Arloa Chroman, PA-C 03/10/23 [phone]

## 2023-03-08 DIAGNOSIS — R1033 Periumbilical pain: Secondary | ICD-10-CM | POA: Diagnosis not present

## 2023-03-08 DIAGNOSIS — R112 Nausea with vomiting, unspecified: Secondary | ICD-10-CM | POA: Diagnosis not present

## 2023-03-08 DIAGNOSIS — R1084 Generalized abdominal pain: Secondary | ICD-10-CM | POA: Diagnosis not present

## 2023-03-08 LAB — POTASSIUM: Potassium: 4 mmol/L (ref 3.5–5.1)

## 2023-03-08 MED ORDER — LACTATED RINGERS IV SOLN
INTRAVENOUS | Status: DC
Start: 2023-03-08 — End: 2023-03-09

## 2023-03-08 NOTE — Progress Notes (Addendum)
 PROGRESS NOTE    [name]  FMW:[id number]  DOB: [birth year]  DOA: 03/07/2023 PCP: [name], MD Outpatient Specialists:   Hospital course:  83 year old man with HTN, diverticulitis, radiation proctitis and cystitis can [place] to XRT for prostate cancer was admitted yesterday for persistent abdominal pain which had previously been intermittent.  CTA showed mesenteric/small bowel swirling and possible edema.  No overt small bowel obstruction but did have a short segment of fluid-filled small bowel.  Was seen by general surgery who recommended conservative management.   Subjective:  Patient states he thinks he is a little better.  Notes that yesterday the pain so hard I could not walk could not rest but now it has resolved and is almost back to normal.  Notes I think I can walk a little bit now.  Patient also notes that previous nausea has resolved.  Was passing gas yesterday but has not passed any gas since admission.  Objective: Vitals:   03/07/23 2030 03/07/23 2146 03/08/23 0045 03/08/23 0523  BP: 119/82 (!) 160/79 121/67 127/68  Pulse: 64 65 63 (!) 53  Resp: 13 19 18 17   Temp:  98.7 F (37.1 C) 98 F (36.7 C) 98.1 F (36.7 C)  TempSrc:  Oral Oral Oral  SpO2: 98% 98% 95% 100%  Weight:  82.3 kg    Height:  5' 11 (1.803 m)      Intake/Output Summary (Last 24 hours) at 03/08/2023 1004 Last data filed at 03/08/2023 9175 Gross per 24 hour  Intake 1240 ml  Output 1250 ml  Net -10 ml   Filed Weights   03/07/23 0335 03/07/23 2146  Weight: 81.6 kg 82.3 kg     Exam:  General: Pleasant well-appearing man lying in bed in NAD Eyes: sclera anicteric, conjuctiva mild injection bilaterally CVS: S1-S2, regular  Respiratory:  decreased air entry bilaterally secondary to decreased inspiratory effort, rales at bases  GI: Decreased bowel sounds but present, abdomen is firm but not hard, no tenderness to light or deep palpation throughout abdomen.  No rebound  tenderness LE: Warm and well-perfused Neuro: A/O x 3,  grossly nonfocal.  Psych: patient is logical and coherent, judgement and insight appear normal, mood and affect appropriate to situation.  Data Reviewed:  Basic Metabolic Panel: Recent Labs  Lab 03/07/23 0518 03/07/23 1659  NA 138  --   K 3.3*  --   CL 102  --   CO2 24  --   GLUCOSE 128*  --   BUN 14  --   CREATININE 0.89  --   CALCIUM 9.5  --   MG  --  1.8  PHOS  --  3.4    CBC: Recent Labs  Lab 03/07/23 0518  WBC 6.5  HGB 11.8*  HCT 35.2*  MCV 84.4  PLT 250     Scheduled Meds: Continuous Infusions:   Assessment & Plan:   Abdominal pain with swirling on CT Unclear cause of abdominal pain, partial SBO or internal mesenteric hernia are on the differential Pain is now improved with conservative management, much less pain, no nausea Patient however still has not passed any gas He is on clear liquid diet to be advanced as tolerated  Start very low-dose IV fluids as patient does not appear to be drinking much  Hypokalemia Will recheck  Blood pressure Patient with initially elevated BP on admission Patient not on BP meds at home Presently normotensive    DVT prophylaxis: SCD Code Status: Full Family  Communication: None today     Studies: CT ABDOMEN PELVIS W CONTRAST Result Date: 03/07/2023 CLINICAL DATA:  Abdominal pain.  Nausea vomiting. EXAM: CT ABDOMEN AND PELVIS WITH CONTRAST TECHNIQUE: Multidetector CT imaging of the abdomen and pelvis was performed using the standard protocol following bolus administration of intravenous contrast. RADIATION DOSE REDUCTION: This exam was performed according to the departmental dose-optimization program which includes automated exposure control, adjustment of the mA and/or kV according to patient size and/or use of iterative reconstruction technique. CONTRAST:  OMNIPAQUE  IOHEXOL  300 MG/ML  SOLN COMPARISON:  08/20/2022 FINDINGS: Lower chest: No acute  findings. Hepatobiliary: No suspicious focal abnormality within the liver parenchyma. Gallbladder is distended without evidence for gallstones, gallbladder wall thickening, or pericholecystic fluid. No intrahepatic or extrahepatic biliary dilation. Pancreas: No focal mass lesion. No dilatation of the main duct. No intraparenchymal cyst. No peripancreatic edema. Spleen: No splenomegaly. No suspicious focal mass lesion. Adrenals/Urinary Tract: No adrenal nodule or mass. 3.8 cm cyst in the lower pole right kidney is similar to prior study and comparing back to 07/16/2021. 5.2 cm cyst in the interpolar left kidney has increased from 4.4 cm on the 2023 exam. Lateral to this cyst is a second cyst with higher density measuring 2.4 cm, slightly decreased from 2.7 cm on the 2023 exam, suggestive of benign etiology such as a benign cyst complicated by proteinaceous debris or hemorrhage. Additional tiny bilateral low-density lesions in both kidneys are too small to characterize but statistically most likely benign. No follow-up imaging of these renal lesions specifically recommended. No evidence for hydroureter. Bladder is nondistended which likely accentuates wall thickness although ill definition of the bladder wall with this apparent thickening raises the question of cystitis. Stomach/Bowel: Stomach is unremarkable. No gastric wall thickening. No evidence of outlet obstruction. Duodenum is normally positioned as is the ligament of Treitz. No small bowel wall thickening. No small bowel dilatation. There is a suggestion of mesenteric/small-bowel swirling in the right abdomen, more prominent than on the most recent comparison but evident on the prior CT from 07/16/2021. There may be some trace edema in this mesentery of the right abdomen and upper normal lymph nodes in this region are more pronounced than on the prior 2 studies. There is no overt small bowel obstruction but fluid-filled short segment small bowel in the central  pelvis measures up to 2.8 cm diameter. No small bowel wall thickening or small bowel pneumatosis. Terminal ileum is decompressed. The appendix is normal. No gross colonic mass. No colonic wall thickening. Diverticular changes are noted in the left colon without evidence of diverticulitis. Vascular/Lymphatic: There is mild atherosclerotic calcification of the abdominal aorta without aneurysm. No retroperitoneal lymphadenopathy. No pelvic sidewall lymphadenopathy. Reproductive: Prostate gland is enlarged.  Fiducial markers evident. Other: No intraperitoneal free fluid. Musculoskeletal: Bone mineralization appears heterogeneous in the pelvis and lower lumbar spine, similar to prior. IMPRESSION: 1. There is a suggestion of mesenteric/small-bowel swirling in the right abdomen, more prominent than on the most recent comparison but evident although less pronounced on the prior CT from 07/16/2021. There may be some trace edema in this mesentery of the right abdomen and upper normal lymph nodes in this region are more pronounced than on the prior 2 studies. There is no overt small bowel obstruction but fluid-filled short segment small bowel in the central pelvis measures up to 2.8 cm diameter. Imaging features are nonspecific but can be seen in the setting of internal hernia or small bowel torsion. While no overtly complicating features  at this time, close follow-up is warranted. Consider surgical consultation. 2. Bladder is nondistended which likely accentuates wall thickness although ill definition of the bladder wall with this apparent thickening suggests cystitis. 3. Prostatomegaly. 4. Left colonic diverticulosis without diverticulitis. 5. Additional tiny bilateral low-density lesions in both kidneys are too small to characterize but statistically most likely benign. 6.  Aortic Atherosclerosis (ICD10-I70.0). Electronically Signed   By: Camellia Candle M.D.   On: 03/07/2023 06:39    Principal Problem:   Abdominal  pain Active Problems:   Mesenteric hernia     Ivan Vangie Pike, [facility]  If 7PM-7AM, please contact night-coverage [email or web address]   LOS: 0 days

## 2023-03-08 NOTE — Care Management Obs Status (Signed)
 MEDICARE OBSERVATION STATUS NOTIFICATION   Patient Details  Name: [name] MRN: [id number] Date of Birth: 11/06/1940   Medicare Observation Status Notification Given:  Yes    Levie Ream, RN 03/08/2023, 2:09 PM

## 2023-03-08 NOTE — Progress Notes (Signed)
 Subjective/Chief Complaint: No complaints. Denies abd pain. No vomiting   Objective: Vital signs in last 24 hours: Temp:  [98 F (36.7 C)-98.7 F (37.1 C)] 98.1 F (36.7 C) (02/08 0523) Pulse Rate:  [53-65] 53 (02/08 0523) Resp:  [11-19] 17 (02/08 0523) BP: (119-160)/(67-82) 127/68 (02/08 0523) SpO2:  [94 %-100 %] 100 % (02/08 0523) Weight:  [82.3 kg] 82.3 kg (02/07 2146)    Intake/Output from previous day: 02/07 0701 - 02/08 0700 In: 1000 [IV Piggyback:1000] Out: 900 [Urine:900] Intake/Output this shift: Total I/O In: 240 [P.O.:240] Out: 350 [Urine:350]  General appearance: alert and cooperative Resp: clear to auscultation bilaterally Cardio: regular rate and rhythm GI: soft, nontender. Not distended  Lab Results:  Recent Labs    03/07/23 0518  WBC 6.5  HGB 11.8*  HCT 35.2*  PLT 250   BMET Recent Labs    03/07/23 0518  NA 138  K 3.3*  CL 102  CO2 24  GLUCOSE 128*  BUN 14  CREATININE 0.89  CALCIUM 9.5   PT/INR No results for input(s): LABPROT, INR in the last 72 hours. ABG No results for input(s): PHART, HCO3 in the last 72 hours.  Invalid input(s): PCO2, PO2  Studies/Results: CT ABDOMEN PELVIS W CONTRAST Result Date: 03/07/2023 CLINICAL DATA:  Abdominal pain.  Nausea vomiting. EXAM: CT ABDOMEN AND PELVIS WITH CONTRAST TECHNIQUE: Multidetector CT imaging of the abdomen and pelvis was performed using the standard protocol following bolus administration of intravenous contrast. RADIATION DOSE REDUCTION: This exam was performed according to the departmental dose-optimization program which includes automated exposure control, adjustment of the mA and/or kV according to patient size and/or use of iterative reconstruction technique. CONTRAST:  OMNIPAQUE  IOHEXOL  300 MG/ML  SOLN COMPARISON:  08/20/2022 FINDINGS: Lower chest: No acute findings. Hepatobiliary: No suspicious focal abnormality within the liver parenchyma. Gallbladder is  distended without evidence for gallstones, gallbladder wall thickening, or pericholecystic fluid. No intrahepatic or extrahepatic biliary dilation. Pancreas: No focal mass lesion. No dilatation of the main duct. No intraparenchymal cyst. No peripancreatic edema. Spleen: No splenomegaly. No suspicious focal mass lesion. Adrenals/Urinary Tract: No adrenal nodule or mass. 3.8 cm cyst in the lower pole right kidney is similar to prior study and comparing back to 07/16/2021. 5.2 cm cyst in the interpolar left kidney has increased from 4.4 cm on the 2023 exam. Lateral to this cyst is a second cyst with higher density measuring 2.4 cm, slightly decreased from 2.7 cm on the 2023 exam, suggestive of benign etiology such as a benign cyst complicated by proteinaceous debris or hemorrhage. Additional tiny bilateral low-density lesions in both kidneys are too small to characterize but statistically most likely benign. No follow-up imaging of these renal lesions specifically recommended. No evidence for hydroureter. Bladder is nondistended which likely accentuates wall thickness although ill definition of the bladder wall with this apparent thickening raises the question of cystitis. Stomach/Bowel: Stomach is unremarkable. No gastric wall thickening. No evidence of outlet obstruction. Duodenum is normally positioned as is the ligament of Treitz. No small bowel wall thickening. No small bowel dilatation. There is a suggestion of mesenteric/small-bowel swirling in the right abdomen, more prominent than on the most recent comparison but evident on the prior CT from 07/16/2021. There may be some trace edema in this mesentery of the right abdomen and upper normal lymph nodes in this region are more pronounced than on the prior 2 studies. There is no overt small bowel obstruction but fluid-filled short segment small bowel in the  central pelvis measures up to 2.8 cm diameter. No small bowel wall thickening or small bowel pneumatosis.  Terminal ileum is decompressed. The appendix is normal. No gross colonic mass. No colonic wall thickening. Diverticular changes are noted in the left colon without evidence of diverticulitis. Vascular/Lymphatic: There is mild atherosclerotic calcification of the abdominal aorta without aneurysm. No retroperitoneal lymphadenopathy. No pelvic sidewall lymphadenopathy. Reproductive: Prostate gland is enlarged.  Fiducial markers evident. Other: No intraperitoneal free fluid. Musculoskeletal: Bone mineralization appears heterogeneous in the pelvis and lower lumbar spine, similar to prior. IMPRESSION: 1. There is a suggestion of mesenteric/small-bowel swirling in the right abdomen, more prominent than on the most recent comparison but evident although less pronounced on the prior CT from 07/16/2021. There may be some trace edema in this mesentery of the right abdomen and upper normal lymph nodes in this region are more pronounced than on the prior 2 studies. There is no overt small bowel obstruction but fluid-filled short segment small bowel in the central pelvis measures up to 2.8 cm diameter. Imaging features are nonspecific but can be seen in the setting of internal hernia or small bowel torsion. While no overtly complicating features at this time, close follow-up is warranted. Consider surgical consultation. 2. Bladder is nondistended which likely accentuates wall thickness although ill definition of the bladder wall with this apparent thickening suggests cystitis. 3. Prostatomegaly. 4. Left colonic diverticulosis without diverticulitis. 5. Additional tiny bilateral low-density lesions in both kidneys are too small to characterize but statistically most likely benign. 6.  Aortic Atherosclerosis (ICD10-I70.0). Electronically Signed   By: Camellia Candle M.D.   On: 03/07/2023 06:39    Anti-infectives: Anti-infectives (From admission, onward)    None       Assessment/Plan: s/p * No surgery found * Advance  diet No sign of obstruction No indication for urgent surgery Will follow  LOS: 0 days    [name] 03/08/2023

## 2023-03-09 DIAGNOSIS — R1033 Periumbilical pain: Secondary | ICD-10-CM | POA: Diagnosis not present

## 2023-03-09 DIAGNOSIS — R112 Nausea with vomiting, unspecified: Secondary | ICD-10-CM | POA: Diagnosis not present

## 2023-03-09 DIAGNOSIS — R1084 Generalized abdominal pain: Secondary | ICD-10-CM | POA: Diagnosis not present

## 2023-03-09 LAB — BASIC METABOLIC PANEL
Anion gap: 8 (ref 5–15)
BUN: 11 mg/dL (ref 8–23)
CO2: 25 mmol/L (ref 22–32)
Calcium: 9.3 mg/dL (ref 8.9–10.3)
Chloride: 105 mmol/L (ref 98–111)
Creatinine, Ser: 1.08 mg/dL (ref 0.61–1.24)
GFR, Estimated: >60 mL/min (ref >60)
Glucose, Bld: 88 mg/dL (ref 70–99)
Potassium: 3.8 mmol/L (ref 3.5–5.1)
Sodium: 138 mmol/L (ref 135–145)

## 2023-03-09 LAB — CBC
HCT: 30.9 % — ABNORMAL LOW (ref 39.0–52.0)
Hemoglobin: 10.3 g/dL — ABNORMAL LOW (ref 13.0–17.0)
MCH: 28.2 pg (ref 26.0–34.0)
MCHC: 33.3 g/dL (ref 30.0–36.0)
MCV: 84.7 fL (ref 80.0–100.0)
Platelets: 228 10*3/uL (ref 150–400)
RBC: 3.65 MIL/uL — ABNORMAL LOW (ref 4.22–5.81)
RDW: 13.5 % (ref 11.5–15.5)
WBC: 3.5 10*3/uL — ABNORMAL LOW (ref 4.0–10.5)
nRBC: 0 % (ref 0.0–0.2)

## 2023-03-09 NOTE — Progress Notes (Signed)
   Subjective/Chief Complaint: No complaints. Tolerating regular diet   Objective: Vital signs in last 24 hours: Temp:  [97.7 F (36.5 C)-98.8 F (37.1 C)] 97.7 F (36.5 C) (02/09 0420) Pulse Rate:  [52-68] 65 (02/09 0420) Resp:  [16-20] 20 (02/09 0420) BP: (130-152)/(55-69) 152/69 (02/09 0420) SpO2:  [100 %] 100 % (02/09 0420)    Intake/Output from previous day: 02/08 0701 - 02/09 0700 In: 819.5 [P.O.:600; I.V.:219.5] Out: 1300 [Urine:1300] Intake/Output this shift: Total I/O In: -  Out: 375 [Urine:375]  General appearance: alert and cooperative Resp: clear to auscultation bilaterally Cardio: regular rate and rhythm GI: soft, nontender  Lab Results:  Recent Labs    03/07/23 0518 03/09/23 0417  WBC 6.5 3.5*  HGB 11.8* 10.3*  HCT 35.2* 30.9*  PLT 250 228   BMET Recent Labs    03/07/23 0518 03/08/23 1135 03/09/23 0417  NA 138  --  138  K 3.3* 4.0 3.8  CL 102  --  105  CO2 24  --  25  GLUCOSE 128*  --  88  BUN 14  --  11  CREATININE 0.89  --  1.08  CALCIUM 9.5  --  9.3   PT/INR No results for input(s): LABPROT, INR in the last 72 hours. ABG No results for input(s): PHART, HCO3 in the last 72 hours.  Invalid input(s): PCO2, PO2  Studies/Results: No results found.  Anti-infectives: Anti-infectives (From admission, onward)    None       Assessment/Plan: s/p * No surgery found * Advance diet No sign of obstruction No indication for urgent surgery Will sign off  LOS: 0 days    Deward Null III 03/09/2023

## 2023-03-09 NOTE — Discharge Summary (Signed)
 [name] FMW:[id number] DOB: 11-Jul-1940 DOA: 03/07/2023  PCP: [name] Mliss Dragon, MD  Admit date: 03/07/2023  Discharge date: 03/09/2023  Admitted From: Home   disposition: Home   Recommendations for Outpatient Follow-up:   Follow up with PCP in 1-2 weeks   Home Health: N/A Equipment/Devices: N/A Consultations: General surgery Discharge Condition: Improved CODE STATUS: Full Diet Recommendation: Heart Healthy   Diet Order             Diet regular Room service appropriate? Yes; Fluid consistency: Thin  Diet effective now           Diet - low sodium heart healthy                    Chief Complaint  Patient presents with   Abdominal Pain     Brief history of present illness from the day of admission and additional interim summary      83 y.o. male with medical history significant for BPH, HLD, HTN, osteoarthritis, gout, diverticulitis, prostate cancer, radiation prostatitis and cystitis and asthma who presented to the ED for evaluation of abdominal pain. Patient reports he has had intermittent periumbilical pain over the last year but usually the pain usually resolves 5 to 10 minutes. Yesterday, he ate some coleslaw for a sandwich and overnight, he started having similar pain but the pain did not resolve by usual prompting him to present to the ED. He reports associated nausea and vomiting with nonbilious nonbloody emesis 2 hours prior to arrival.  Last bowel movement was prior to EMS arrival last night. The pain has improved to's 7/10 from a 10/10 at worst but he continues to feel slightly nauseous. A PO challenge was attempted in the ED but patient reports he was able to keep some of the food down but became sick on his stomach. He denies any constipation, diarrhea, fevers, chills, dizziness, headache,  dysuria, bloody stools or hematuria.  Patient started having some cramping during my evaluation this resolved in less than 1 minute.   ED Course: Initial vitals showed temp 97.5, RR 17, HR 48, BP 158/71, SpO2 100% on room air.  Labs significant for K+ of 3.3, normal lipase, white count, kidney function, UA and lactic acid.  CT A/P shows mesenteric/small bowel swirling in the right abdomen concerning for internal hernia or small bowel torsion but no overt SBO. Patient received IV Zofran  4 mg x2, IV morphine  4 mg x1 and IV fentanyl  50 mcg x1. General surgery consulted for evaluation.                                                                  Hospital Course    Patient was admitted for nonspecific abdominal pain of unclear etiology.  He was seen by general surgery who  noted he may have either partial SBO or mesenteric internal hernia.  Patient was treated with conservative management, bowel rest and IV fluid support.  Improved slowly with decreasing abdominal pain, resumption of flatus and decreased nausea.  Patient initially n.p.o. but diet was advanced and on day of discharge patient had bacon and eggs without any difficulty, no nausea, vomiting or increased abdominal pain.  Patient is discharged home on no new medications to follow-up with his PCP and referral to general surgery as an outpatient if warranted.   Discharge diagnosis     Principal Problem:   Abdominal pain Active Problems:   Mesenteric hernia    Discharge instructions    Discharge Instructions     Diet - low sodium heart healthy   Complete by: As directed    Discharge instructions   Complete by: As directed    Eat soft foods that are high in fiber for the next 1-2 weeks.  If the belly pain comes back, come back to be re-evaluated   Increase activity slowly   Complete by: As directed        Discharge Medications   Allergies as of 03/09/2023   No Known Allergies      Medication List    You have not been  prescribed any medications.      Follow-up Information     [name] Mliss Dragon, MD Follow up.   Specialty: Internal Medicine Contact information: [address] [place] [address] [phone]                 Major procedures and Radiology Reports - PLEASE review detailed and final reports thoroughly  -        CT ABDOMEN PELVIS W CONTRAST Result Date: 03/07/2023 CLINICAL DATA:  Abdominal pain.  Nausea vomiting. EXAM: CT ABDOMEN AND PELVIS WITH CONTRAST TECHNIQUE: Multidetector CT imaging of the abdomen and pelvis was performed using the standard protocol following bolus administration of intravenous contrast. RADIATION DOSE REDUCTION: This exam was performed according to the departmental dose-optimization program which includes automated exposure control, adjustment of the mA and/or kV according to patient size and/or use of iterative reconstruction technique. CONTRAST:  OMNIPAQUE  IOHEXOL  300 MG/ML  SOLN COMPARISON:  08/20/2022 FINDINGS: Lower chest: No acute findings. Hepatobiliary: No suspicious focal abnormality within the liver parenchyma. Gallbladder is distended without evidence for gallstones, gallbladder wall thickening, or pericholecystic fluid. No intrahepatic or extrahepatic biliary dilation. Pancreas: No focal mass lesion. No dilatation of the main duct. No intraparenchymal cyst. No peripancreatic edema. Spleen: No splenomegaly. No suspicious focal mass lesion. Adrenals/Urinary Tract: No adrenal nodule or mass. 3.8 cm cyst in the lower pole right kidney is similar to prior study and comparing back to 07/16/2021. 5.2 cm cyst in the interpolar left kidney has increased from 4.4 cm on the 2023 exam. Lateral to this cyst is a second cyst with higher density measuring 2.4 cm, slightly decreased from 2.7 cm on the 2023 exam, suggestive of benign etiology such as a benign cyst complicated by proteinaceous debris or hemorrhage. Additional tiny bilateral low-density lesions  in both kidneys are too small to characterize but statistically most likely benign. No follow-up imaging of these renal lesions specifically recommended. No evidence for hydroureter. Bladder is nondistended which likely accentuates wall thickness although ill definition of the bladder wall with this apparent thickening raises the question of cystitis. Stomach/Bowel: Stomach is unremarkable. No gastric wall thickening. No evidence of outlet obstruction. Duodenum is normally positioned as is the ligament  of Treitz. No small bowel wall thickening. No small bowel dilatation. There is a suggestion of mesenteric/small-bowel swirling in the right abdomen, more prominent than on the most recent comparison but evident on the prior CT from 07/16/2021. There may be some trace edema in this mesentery of the right abdomen and upper normal lymph nodes in this region are more pronounced than on the prior 2 studies. There is no overt small bowel obstruction but fluid-filled short segment small bowel in the central pelvis measures up to 2.8 cm diameter. No small bowel wall thickening or small bowel pneumatosis. Terminal ileum is decompressed. The appendix is normal. No gross colonic mass. No colonic wall thickening. Diverticular changes are noted in the left colon without evidence of diverticulitis. Vascular/Lymphatic: There is mild atherosclerotic calcification of the abdominal aorta without aneurysm. No retroperitoneal lymphadenopathy. No pelvic sidewall lymphadenopathy. Reproductive: Prostate gland is enlarged.  Fiducial markers evident. Other: No intraperitoneal free fluid. Musculoskeletal: Bone mineralization appears heterogeneous in the pelvis and lower lumbar spine, similar to prior. IMPRESSION: 1. There is a suggestion of mesenteric/small-bowel swirling in the right abdomen, more prominent than on the most recent comparison but evident although less pronounced on the prior CT from 07/16/2021. There may be some trace edema in  this mesentery of the right abdomen and upper normal lymph nodes in this region are more pronounced than on the prior 2 studies. There is no overt small bowel obstruction but fluid-filled short segment small bowel in the central pelvis measures up to 2.8 cm diameter. Imaging features are nonspecific but can be seen in the setting of internal hernia or small bowel torsion. While no overtly complicating features at this time, close follow-up is warranted. Consider surgical consultation. 2. Bladder is nondistended which likely accentuates wall thickness although ill definition of the bladder wall with this apparent thickening suggests cystitis. 3. Prostatomegaly. 4. Left colonic diverticulosis without diverticulitis. 5. Additional tiny bilateral low-density lesions in both kidneys are too small to characterize but statistically most likely benign. 6.  Aortic Atherosclerosis (ICD10-I70.0). Electronically Signed   By: Camellia Candle M.D.   On: 03/07/2023 06:39    Micro Results    No results found for this or any previous visit (from the past 240 hours).  Today   Subjective    [name] feels much improved since admission.  Feels ready to go home.  Denies chest pain, shortness of breath or abdominal pain.  Feels they can take care of themselves with the resources they have at home.  Objective   Blood pressure (!) 152/69, pulse 65, temperature 97.7 F (36.5 C), temperature source Oral, resp. rate 20, height 5' 11 (1.803 m), weight 82.3 kg, SpO2 100%.   Intake/Output Summary (Last 24 hours) at 03/09/2023 1122 Last data filed at 03/09/2023 1005 Gross per 24 hour  Intake 819.47 ml  Output 1525 ml  Net -705.53 ml    Exam General: Patient appears well and in good spirits sitting up in bed in no acute distress.  Eyes: sclera anicteric, conjuctiva mild injection bilaterally CVS: S1-S2, regular  Respiratory:  decreased air entry bilaterally secondary to decreased inspiratory effort, rales at bases   GI: NABS, soft, NT  LE: No edema.  Neuro: A/O x 3, Moving all extremities equally with normal strength, CN 3-12 intact, grossly nonfocal.  Psych: patient is logical and coherent, judgement and insight appear normal, mood and affect appropriate to situation.    Data Review   CBC w Diff:  Lab Results  Component  Value Date   WBC 3.5 (L) 03/09/2023   HGB 10.3 (L) 03/09/2023   HGB 13.9 07/26/2021   HCT 30.9 (L) 03/09/2023   HCT 42.1 07/26/2021   PLT 228 03/09/2023   PLT 360 07/26/2021   LYMPHOPCT 11 07/17/2021   MONOPCT 10 07/17/2021   EOSPCT 9 07/17/2021   BASOPCT 1 07/17/2021    CMP:  Lab Results  Component Value Date   NA 138 03/09/2023   NA 136 07/26/2021   K 3.8 03/09/2023   CL 105 03/09/2023   CO2 25 03/09/2023   BUN 11 03/09/2023   BUN 15 07/26/2021   CREATININE 1.08 03/09/2023   CREATININE 1.05 03/15/2014   PROT 7.5 03/07/2023   PROT 7.0 07/26/2021   ALBUMIN 4.0 03/07/2023   ALBUMIN 4.0 07/26/2021   BILITOT 0.4 03/07/2023   BILITOT 0.2 07/26/2021   ALKPHOS 52 03/07/2023   AST 23 03/07/2023   ALT 12 03/07/2023  .   Total Time in preparing paper work, data evaluation and todays exam - 35 minutes  Whitleigh Garramone Tublu Odelia Graciano M.D on 03/09/2023 at 11:22 AM  [facility]

## 2023-03-10 ENCOUNTER — Encounter (HOSPITAL_COMMUNITY): Payer: Self-pay

## 2023-03-10 ENCOUNTER — Other Ambulatory Visit: Payer: Self-pay

## 2023-03-10 ENCOUNTER — Emergency Department (HOSPITAL_COMMUNITY): Payer: Medicare HMO

## 2023-03-10 ENCOUNTER — Emergency Department (HOSPITAL_COMMUNITY)
Admission: EM | Admit: 2023-03-10 | Discharge: 2023-03-11 | Disposition: A | Discharge status: Home / Self Care | Payer: Medicare HMO | Attending: Emergency Medicine | Admitting: Emergency Medicine

## 2023-03-10 DIAGNOSIS — R109 Unspecified abdominal pain: Secondary | ICD-10-CM | POA: Diagnosis present

## 2023-03-10 DIAGNOSIS — R1084 Generalized abdominal pain: Secondary | ICD-10-CM | POA: Insufficient documentation

## 2023-03-10 DIAGNOSIS — K627 Radiation proctitis: Secondary | ICD-10-CM | POA: Diagnosis not present

## 2023-03-10 DIAGNOSIS — K5730 Diverticulosis of large intestine without perforation or abscess without bleeding: Secondary | ICD-10-CM | POA: Diagnosis not present

## 2023-03-10 DIAGNOSIS — K828 Other specified diseases of gallbladder: Secondary | ICD-10-CM | POA: Diagnosis not present

## 2023-03-10 DIAGNOSIS — N281 Cyst of kidney, acquired: Secondary | ICD-10-CM | POA: Diagnosis not present

## 2023-03-10 MED ORDER — IOHEXOL 300 MG/ML  SOLN
100.0000 mL | Freq: Once | INTRAMUSCULAR | Status: AC | PRN
  Administered 2023-03-10: 100 mL via INTRAVENOUS

## 2023-03-10 NOTE — ED Triage Notes (Signed)
 PT states recently admitted for "bowel twisting" was told to come back if pain returned. Pain returned today. Pt has had bowel movement today and was passing gas. N/V.

## 2023-03-10 NOTE — ED Provider Triage Note (Signed)
 Emergency Medicine Provider Triage Evaluation Note  [name] , a 83 y.o. male  was evaluated in triage.  Pt complains of central abdominal pain starting this evening. Discharged on 03/09/2023 after being treated for SBO. Hx of diverticulitis, prostate cancer, cystitis. Has ambulated today. Is not taking any medications.   Reports started vomiting and diarrhea today. Was told to come back if experiencing these symptoms.   Denies fevers, chest pain, shortness of breath, melena, hematochezia, dysuria, hematuria, LE swelling   Review of Systems  Positive: N/a Negative: N/a  Physical Exam  BP (!) 141/97 (BP Location: Right Arm)   Pulse (!) 58   Resp 16   Ht 5\' 11"  (1.803 m)   Wt 82.3 kg   SpO2 98%   BMI 25.31 kg/m  Gen:   Awake, no distress   Resp:  Normal effort  MSK:   Moves extremities without difficulty  Other:    Medical Decision Making  Medically screening exam initiated at 9:16 PM.  Appropriate orders placed.  Dakari S Goshorn was informed that the remainder of the evaluation will be completed by another provider, this initial triage assessment does not replace that evaluation, and the importance of remaining in the ED until their evaluation is complete.     Hayes Lipps, [place] 03/10/23 2123

## 2023-03-11 DIAGNOSIS — R1084 Generalized abdominal pain: Secondary | ICD-10-CM | POA: Diagnosis not present

## 2023-03-11 LAB — URINALYSIS, ROUTINE W REFLEX MICROSCOPIC
Bacteria, UA: NONE SEEN
Bilirubin Urine: NEGATIVE
Glucose, UA: NEGATIVE mg/dL
Ketones, ur: NEGATIVE mg/dL
Leukocytes,Ua: NEGATIVE
Nitrite: NEGATIVE
Protein, ur: NEGATIVE mg/dL
Specific Gravity, Urine: 1.034 — ABNORMAL HIGH (ref 1.005–1.030)
pH: 5 (ref 5.0–8.0)

## 2023-03-11 LAB — COMPREHENSIVE METABOLIC PANEL
ALT: 12 U/L (ref 0–44)
AST: 26 U/L (ref 15–41)
Albumin: 3.7 g/dL (ref 3.5–5.0)
Alkaline Phosphatase: 49 U/L (ref 38–126)
Anion gap: 9 (ref 5–15)
BUN: 14 mg/dL (ref 8–23)
CO2: 23 mmol/L (ref 22–32)
Calcium: 8.8 mg/dL — ABNORMAL LOW (ref 8.9–10.3)
Chloride: 104 mmol/L (ref 98–111)
Creatinine, Ser: 0.96 mg/dL (ref 0.61–1.24)
GFR, Estimated: >60 mL/min (ref >60)
Glucose, Bld: 86 mg/dL (ref 70–99)
Potassium: 3.5 mmol/L (ref 3.5–5.1)
Sodium: 136 mmol/L (ref 135–145)
Total Bilirubin: 0.6 mg/dL (ref 0.0–1.2)
Total Protein: 6.8 g/dL (ref 6.5–8.1)

## 2023-03-11 LAB — CBC WITH DIFFERENTIAL/PLATELET
Abs Immature Granulocytes: 0.02 10*3/uL (ref 0.00–0.07)
Basophils Absolute: 0 10*3/uL (ref 0.0–0.1)
Basophils Relative: 1 %
Eosinophils Absolute: 0.2 10*3/uL (ref 0.0–0.5)
Eosinophils Relative: 4 %
HCT: 34.6 % — ABNORMAL LOW (ref 39.0–52.0)
Hemoglobin: 11.6 g/dL — ABNORMAL LOW (ref 13.0–17.0)
Immature Granulocytes: 0 %
Lymphocytes Relative: 32 %
Lymphs Abs: 1.7 10*3/uL (ref 0.7–4.0)
MCH: 27.9 pg (ref 26.0–34.0)
MCHC: 33.5 g/dL (ref 30.0–36.0)
MCV: 83.2 fL (ref 80.0–100.0)
Monocytes Absolute: 0.4 10*3/uL (ref 0.1–1.0)
Monocytes Relative: 8 %
Neutro Abs: 2.9 10*3/uL (ref 1.7–7.7)
Neutrophils Relative %: 55 %
Platelets: 241 10*3/uL (ref 150–400)
RBC: 4.16 MIL/uL — ABNORMAL LOW (ref 4.22–5.81)
RDW: 13.3 % (ref 11.5–15.5)
WBC: 5.3 10*3/uL (ref 4.0–10.5)
nRBC: 0 % (ref 0.0–0.2)

## 2023-03-11 LAB — I-STAT CG4 LACTIC ACID, ED: Lactic Acid, Venous: 0.9 mmol/L (ref 0.5–1.9)

## 2023-03-11 LAB — LIPASE, BLOOD: Lipase: 38 U/L (ref 11–51)

## 2023-03-11 MED ORDER — ONDANSETRON HCL 4 MG/2ML IJ SOLN
4.0000 mg | Freq: Once | INTRAMUSCULAR | Status: AC
  Administered 2023-03-11: 4 mg via INTRAVENOUS
  Filled 2023-03-11: qty 2

## 2023-03-11 MED ORDER — FENTANYL CITRATE PF 50 MCG/ML IJ SOSY
50.0000 ug | PREFILLED_SYRINGE | Freq: Once | INTRAMUSCULAR | Status: AC
  Administered 2023-03-11: 50 ug via INTRAVENOUS
  Filled 2023-03-11: qty 1

## 2023-03-11 MED ORDER — SODIUM CHLORIDE 0.9 % IV BOLUS
500.0000 mL | Freq: Once | INTRAVENOUS | Status: AC
  Administered 2023-03-11: 500 mL via INTRAVENOUS

## 2023-03-11 NOTE — ED Provider Notes (Signed)
  Physical Exam  BP 138/73   Pulse (!) 55   Temp 98.1 F (36.7 C) (Oral)   Resp 17   Ht 5\' 11"  (1.803 m)   Wt 82.3 kg   SpO2 100%   BMI 25.31 kg/m   Physical Exam Vitals and nursing note reviewed.  HENT:     Head: Normocephalic and atraumatic.  Eyes:     Pupils: Pupils are equal, round, and reactive to light.  Cardiovascular:     Rate and Rhythm: Normal rate and regular rhythm.  Pulmonary:     Effort: Pulmonary effort is normal.     Breath sounds: Normal breath sounds.  Abdominal:     Palpations: Abdomen is soft.     Tenderness: There is no abdominal tenderness.  Skin:    General: Skin is warm and dry.  Neurological:     Mental Status: He is alert.  Psychiatric:        Mood and Affect: Mood normal.     Procedures  Procedures  ED Course / MDM   Clinical Course as of 03/11/23 1039  Tue Mar 11, 2023  1037 General surgery PA on-call has evaluated patient in the ED.  He is well-appearing and is appropriate for discharge from a surgical perspective.  He has been able to eat breakfast here and feels better since coming in.  Will follow-up with his primary care team and knows what to come back for [MP]    Clinical Course User Index [MP] Royanne Foots, DO   Medical Decision Making I, Estelle June DO, have assumed care of this patient from the previous provider pending surgery team evaluation, p.o. challenge and disposition  Risk Prescription drug management.          Royanne Foots, DO 03/11/23 1039

## 2023-03-11 NOTE — Progress Notes (Signed)
[facility] Progress Note     Subjective: CC:  Know to our service after recent admission for observation to rule out SBO after CT scan of the abdomen showed possible mesenteric swirling.  He re-presented today due to recurrent periumbilical abdominal pain associated with one episode of vomiting. Pain started yesterday around 6 pm after eating a baked potato. He tolerated breakfast of egg, bacon, grits earlier in the day without sxs. During my exam his pain has improved. He is having flatus and reports a non-bloody, semi-solid stool this morning. He denies fever, chills, urinary sxs, or blood in his vomit or stool.   Objective: Vital signs in last 24 hours: Temp:  [97.9 F (36.6 C)-98.1 F (36.7 C)] 98.1 F (36.7 C) (02/11 0733) Pulse Rate:  [53-60] 53 (02/11 0730) Resp:  [16-20] 17 (02/11 0730) BP: (141-185)/(67-106) 153/97 (02/11 0730) SpO2:  [97 %-100 %] 100 % (02/11 0730) Weight:  [82.3 kg] 82.3 kg (02/10 2113)    Intake/Output from previous day: No intake/output data recorded. Intake/Output this shift: No intake/output data recorded.  PE: Gen:  Alert, NAD, pleasant Card:  Regular rate and rhythm Pulm:  Normal effort ORA Abd: Soft, non-tender, non-distended, no scars, no organomegaly  Skin: warm and dry, no rashes  Psych: A&Ox3   Lab Results:  Recent Labs    03/09/23 0417 03/11/23 0331  WBC 3.5* 5.3  HGB 10.3* 11.6*  HCT 30.9* 34.6*  PLT 228 241   BMET Recent Labs    03/09/23 0417 03/11/23 0331  NA 138 136  K 3.8 3.5  CL 105 104  CO2 25 23  GLUCOSE 88 86  BUN 11 14  CREATININE 1.08 0.96  CALCIUM 9.3 8.8*   PT/INR No results for input(s): "LABPROT", "INR" in the last 72 hours. CMP     Component Value Date/Time   NA 136 03/11/2023 0331   NA 136 07/26/2021 1459   K 3.5 03/11/2023 0331   CL 104 03/11/2023 0331   CO2 23 03/11/2023 0331   GLUCOSE 86 03/11/2023 0331   BUN 14 03/11/2023 0331   BUN 15 07/26/2021 1459   CREATININE 0.96  03/11/2023 0331   CREATININE 1.05 03/15/2014 1546   CALCIUM 8.8 (L) 03/11/2023 0331   PROT 6.8 03/11/2023 0331   PROT 7.0 07/26/2021 1459   ALBUMIN 3.7 03/11/2023 0331   ALBUMIN 4.0 07/26/2021 1459   AST 26 03/11/2023 0331   ALT 12 03/11/2023 0331   ALKPHOS 49 03/11/2023 0331   BILITOT 0.6 03/11/2023 0331   BILITOT 0.2 07/26/2021 1459   GFRNONAA >60 03/11/2023 0331   GFRNONAA 52 (L) 02/03/2014 1111   GFRAA 64 04/15/2019 1002   GFRAA 60 02/03/2014 1111   Lipase     Component Value Date/Time   LIPASE 38 03/11/2023 0331       Studies/Results: CT ABDOMEN PELVIS W CONTRAST Result Date: 03/11/2023 CLINICAL DATA:  Central abdomen pain history of SBO EXAM: CT ABDOMEN AND PELVIS WITH CONTRAST TECHNIQUE: Multidetector CT imaging of the abdomen and pelvis was performed using the standard protocol following bolus administration of intravenous contrast. RADIATION DOSE REDUCTION: This exam was performed according to the departmental dose-optimization program which includes automated exposure control, adjustment of the mA and/or kV according to patient size and/or use of iterative reconstruction technique. CONTRAST:  OMNIPAQUE IOHEXOL 300 MG/ML  SOLN COMPARISON:  CT 03/07/2023, 08/20/2022 FINDINGS: Lower chest: Lung bases demonstrate no acute airspace disease. Hepatobiliary: Distended gallbladder without calcified stone. No biliary dilatation Pancreas: Unremarkable.  No pancreatic ductal dilatation or surrounding inflammatory changes. Spleen: Normal in size without focal abnormality. Adrenals/Urinary Tract: Adrenal glands are normal. Kidneys show no hydronephrosis. Bilateral renal cysts for which no imaging follow-up is recommended. Thick walled urinary bladder with perivesical stranding Stomach/Bowel: Stomach nonenlarged. No dilated small bowel. No acute bowel wall thickening. Slight curvilinear appearance of vasculature and mesentery in the right lower quadrant suggesting possible internal  hernia, but there does not appear to be obstruction related to this finding at this time. Some residual mesenteric edema but not worsened compared with CT from several days prior. Diverticular disease of the left colon without acute wall thickening. Negative appendix Vascular/Lymphatic: Aortic atherosclerosis. No enlarged abdominal or pelvic lymph nodes. Reproductive: Slightly enlarged prostate with fiducial markers. Other: Negative for pelvic effusion or free air. Fat containing inguinal hernias Musculoskeletal: No acute osseous abnormality. IMPRESSION: 1. Negative for bowel obstruction or acute bowel wall thickening. Some residual mesenteric edema in the right lower quadrant, associated with curvilinear orientation of mesenteric vessels as seen on the previous exams, potentially related to internal hernia but without convincing obstruction on this exam related to this finding. Degree of mild mesenteric edema in the right lower quadrant does not appear worsened as compared with 03/07/2023. 2. Thick walled urinary bladder with perivesical stranding, correlate with urinalysis to exclude cystitis. 3. Diverticular disease of the left colon without acute wall thickening. 4. Aortic atherosclerosis. Aortic Atherosclerosis (ICD10-I70.0). Electronically Signed   By: Jasmine Pang M.D.   On: 03/11/2023 00:03    Anti-infectives: Anti-infectives (From admission, onward)    None      Assessment/Plan  83 y/o M who presents with recurrent periumbilical abdominal pain and one episode of emesis. He was recently admitted to the hospital 2/7-2/8 with similar symptoms. CT scan today shows small bowel "siwrling" in the right abdomen, similar to CT scans on 03/07/23 and 07/16/21. No evidence of bowel obstruction on CT. No clinical evidence of bowel obstruction. HIs pain is improved in the ED. He is afebrile, CBC and CMP are unremarkable. Lactic acid is normal. On exam he has no abdominal pain or rebound tenderness.   No  emergent surgical needs. No evidence of SBO or bowel ischemia. Recommend PO trial and discharge home with return precautions if he tolerates PO without recurrent sxs.     LOS: 0 days   I reviewed nursing notes, ED provider notes, last 24 h vitals and pain scores, last 48 h intake and output, last 24 h labs and trends, and last 24 h imaging results.  This care required moderate level of medical decision making.   Hosie Spangle, PA-C [facility] Please see Amion for pager number during day hours 7:00am-4:30pm  g

## 2023-03-11 NOTE — ED Notes (Signed)
Pt was able to drink and eat well

## 2023-03-11 NOTE — Discharge Instructions (Addendum)
You were seen in the emerged department for abdominal pain Your blood work looked okay and there was no evidence of UTI The CAT scan showed some changes which are more chronic for you The surgery team evaluated you and there is no need to keep you in the hospital at this time You were able to eat and drink and felt better It is important that you follow-up with your primary care doctor within the next week for reevaluation Return to the emergency department for severe abdominal pain, nausea, vomiting if you are unable to eat or drink or have any other concerns

## 2023-03-11 NOTE — ED Provider Notes (Signed)
[place] EMERGENCY DEPARTMENT AT [facility] Provider Note   CSN: [id number] Arrival date & time: 03/10/23  2026     History  Chief Complaint  Patient presents with   Abdominal Pain    [name] is a 83 y.o. male.  The history is provided by the patient.  Abdominal Pain [name] is a 83 y.o. male who presents to the Emergency Department complaining of abdominal pain.  He presents to the emergency department for evaluation of central abdominal pain that started at 6 PM on Monday.  Pain radiates to his back.  He had associated sweating when the pain began.  He developed vomiting at 9 PM.  He also had a runny bowel movements around the same time.  No dysuria.  He was just discharged from the hospital February 9 following admission for partial bowel obstruction.  He presents for evaluation due to recurrent pain.  His pain was completely resolved at time of hospital discharge.  No fevers.  Rates his pain as 8 out of 10. Lives with his son.      Home Medications Prior to Admission medications   Not on File      Allergies    Patient has no known allergies.    Review of Systems   Review of Systems  Gastrointestinal:  Positive for abdominal pain.  All other systems reviewed and are negative.   Physical Exam Updated Vital Signs BP (!) 153/97   Pulse (!) 53   Temp 98.1 F (36.7 C) (Oral)   Resp 17   Ht 5\' 11"  (1.803 m)   Wt 82.3 kg   SpO2 100%   BMI 25.31 kg/m  Physical Exam Vitals and nursing note reviewed.  Constitutional:      Appearance: He is well-developed.  HENT:     Head: Normocephalic and atraumatic.  Cardiovascular:     Rate and Rhythm: Normal rate and regular rhythm.  Pulmonary:     Effort: Pulmonary effort is normal. No respiratory distress.  Abdominal:     Palpations: Abdomen is soft.     Tenderness: There is no abdominal tenderness. There is no guarding or rebound.  Musculoskeletal:        General: No swelling or tenderness.   Skin:    General: Skin is warm and dry.  Neurological:     Mental Status: He is alert and oriented to person, place, and time.  Psychiatric:        Behavior: Behavior normal.     ED Results / Procedures / Treatments   Labs (all labs ordered are listed, but only abnormal results are displayed) Labs Reviewed  CBC WITH DIFFERENTIAL/PLATELET - Abnormal; Notable for the following components:      Result Value   RBC 4.16 (*)    Hemoglobin 11.6 (*)    HCT 34.6 (*)    All other components within normal limits  COMPREHENSIVE METABOLIC PANEL - Abnormal; Notable for the following components:   Calcium 8.8 (*)    All other components within normal limits  URINALYSIS, ROUTINE W REFLEX MICROSCOPIC - Abnormal; Notable for the following components:   Color, Urine STRAW (*)    Specific Gravity, Urine 1.034 (*)    Hgb urine dipstick MODERATE (*)    All other components within normal limits  LIPASE, BLOOD  I-STAT CG4 LACTIC ACID, ED    EKG None  Radiology CT ABDOMEN PELVIS W CONTRAST Result Date: 03/11/2023 CLINICAL DATA:  Central abdomen pain history of  SBO EXAM: CT ABDOMEN AND PELVIS WITH CONTRAST TECHNIQUE: Multidetector CT imaging of the abdomen and pelvis was performed using the standard protocol following bolus administration of intravenous contrast. RADIATION DOSE REDUCTION: This exam was performed according to the departmental dose-optimization program which includes automated exposure control, adjustment of the mA and/or kV according to patient size and/or use of iterative reconstruction technique. CONTRAST:  OMNIPAQUE IOHEXOL 300 MG/ML  SOLN COMPARISON:  CT 03/07/2023, 08/20/2022 FINDINGS: Lower chest: Lung bases demonstrate no acute airspace disease. Hepatobiliary: Distended gallbladder without calcified stone. No biliary dilatation Pancreas: Unremarkable. No pancreatic ductal dilatation or surrounding inflammatory changes. Spleen: Normal in size without focal abnormality.  Adrenals/Urinary Tract: Adrenal glands are normal. Kidneys show no hydronephrosis. Bilateral renal cysts for which no imaging follow-up is recommended. Thick walled urinary bladder with perivesical stranding Stomach/Bowel: Stomach nonenlarged. No dilated small bowel. No acute bowel wall thickening. Slight curvilinear appearance of vasculature and mesentery in the right lower quadrant suggesting possible internal hernia, but there does not appear to be obstruction related to this finding at this time. Some residual mesenteric edema but not worsened compared with CT from several days prior. Diverticular disease of the left colon without acute wall thickening. Negative appendix Vascular/Lymphatic: Aortic atherosclerosis. No enlarged abdominal or pelvic lymph nodes. Reproductive: Slightly enlarged prostate with fiducial markers. Other: Negative for pelvic effusion or free air. Fat containing inguinal hernias Musculoskeletal: No acute osseous abnormality. IMPRESSION: 1. Negative for bowel obstruction or acute bowel wall thickening. Some residual mesenteric edema in the right lower quadrant, associated with curvilinear orientation of mesenteric vessels as seen on the previous exams, potentially related to internal hernia but without convincing obstruction on this exam related to this finding. Degree of mild mesenteric edema in the right lower quadrant does not appear worsened as compared with 03/07/2023. 2. Thick walled urinary bladder with perivesical stranding, correlate with urinalysis to exclude cystitis. 3. Diverticular disease of the left colon without acute wall thickening. 4. Aortic atherosclerosis. Aortic Atherosclerosis (ICD10-I70.0). Electronically Signed   By: Jasmine Pang M.D.   On: 03/11/2023 00:03    Procedures Procedures    Medications Ordered in ED Medications  iohexol (OMNIPAQUE) 300 MG/ML solution 100 mL (100 mLs Intravenous Contrast Given 03/10/23 2328)  sodium chloride 0.9 % bolus 500 mL (0  mLs Intravenous Stopped 03/11/23 0550)  fentaNYL (SUBLIMAZE) injection 50 mcg (50 mcg Intravenous Given 03/11/23 0353)  ondansetron (ZOFRAN) injection 4 mg (4 mg Intravenous Given 03/11/23 0352)    ED Course/ Medical Decision Making/ A&P                                 Medical Decision Making Risk Prescription drug management.   Patient with history of prostate cancer status post radiation therapy here for evaluation of recurrent abdominal pain after recent hospitalization for abdominal pain with partial SBO.  He reports significant pain on examination but has minimal tenderness on palpation.  Lactic acid is within normal limits.  CT abdomen pelvis demonstrates persistent mesenteric edema with possible internal hernia but no definite obstruction.  He also has bladder wall thickening.  UA is not consistent with UTI, suspect this is a chronic finding secondary to his prior radiation.  Given CT scan abnormalities and patient's report of persistent abdominal pain general surgery consulted for recommendations.  Patient care transferred pending surgery recommendations.        Final Clinical Impression(s) / ED Diagnoses Final diagnoses:  Generalized abdominal pain    Rx / DC Orders ED Discharge Orders     None         Tilden Fossa, MD 03/11/23 [phone]

## 2023-03-24 ENCOUNTER — Ambulatory Visit (INDEPENDENT_AMBULATORY_CARE_PROVIDER_SITE_OTHER): Payer: Medicare HMO | Admitting: Student

## 2023-03-24 VITALS — BP 130/63 | HR 63 | Temp 97.7°F | Ht 71.0 in | Wt 179.5 lb

## 2023-03-24 DIAGNOSIS — R1033 Periumbilical pain: Secondary | ICD-10-CM

## 2023-03-24 DIAGNOSIS — G8929 Other chronic pain: Secondary | ICD-10-CM | POA: Insufficient documentation

## 2023-03-24 DIAGNOSIS — M25562 Pain in left knee: Secondary | ICD-10-CM

## 2023-03-24 DIAGNOSIS — M25561 Pain in right knee: Secondary | ICD-10-CM | POA: Diagnosis not present

## 2023-03-24 NOTE — Progress Notes (Signed)
   CC: Hospital Follow Up after admission from 03/07/2023 to 03/09/2023 for possible partial SBO treated conservatively as well as an ED visit on 03/10/2023 for recurrent abdominal pain  HPI:  [name] is a 83 y.o. male with pertinent PMH of HTN, prostate cancer s/p seed implant Tatian, gout, and glaucoma who presents as above. Please see assessment and plan below for further details.  Review of Systems:   Pertinent items noted in HPI and/or A&P.  Physical Exam:  Vitals:   03/24/23 0930  BP: 130/63  Pulse: 63  Temp: 97.7 F (36.5 C)  TempSrc: Oral  SpO2: 99%  Weight: 179 lb 8 oz (81.4 kg)  Height: 5\' 11"  (1.803 m)    Constitutional: Well-appearing elderly male. In no acute distress. HEENT: Normocephalic, atraumatic, Sclera non-icteric, PERRL, EOM intact Pulm: Normal work of breathing on room air. Abdomen: Soft, non-tender, non-distended, positive bowel sounds. WUJ:[id number] for extremity edema.  Mild generalized tenderness to palpation over the anterior knee bilaterally without effusion. Skin:Warm and dry. Neuro:Alert and oriented x3. No focal deficit noted. Psych:Pleasant mood and affect.   Assessment & Plan:   Abdominal pain Patient presents for hospital follow-up after being admitted from 03/07/2023 - 03/09/2023 for possible partial SBO that was treated conservatively and then presented to the ED on 03/10/2023 for the same abdominal pain.  Pain is primarily in the suprapubic/periumbilical region and is mostly triggered by certain foods and drinks such as oatmeal and orange soda.  Having a bowel movement will sometimes make it better but mostly he will lay down and wait for it to resolve.  He denies any constipation, bloody or tarry stools, nausea, vomiting, dysuria, flank pain, fevers, weight loss.  He also has recurrent gross hematuria due to radioactive seed implantation for prostate cancer and had a cystoscopy on 10/24/2022 which the patient reported showed that there was no  evidence of bladder cancer however we do not have the records of this from urology.  His last colonoscopy on record was in 2011 and showed small ulcer in the transverse colon and diverticula.  On exam his abdomen is soft, nontender and has positive bowel sounds.  Overall the location of his pain and history of persistent bladder wall thickening is most concerning for a urologic cause of his pain but at his age and having worsening with some foods there is still concern for a GI component or even vascular component.  However his recent lipid panel was within normal limits and he does not have a significant cardiovascular history or symptoms to support angiography at this time.  We will also hold off on GI referral until we try to avoiding triggers and as needed pain management with over-the-counter therapies. We will have him follow-up with urology for reevaluation.  Return precautions discussed.  - Avoid triggering foods and drinks and use Tylenol/ibuprofen as needed for pain - Follow-up with urology  Chronic pain of both knees Patient has chronic bilateral knee pain most consistent with osteoarthritis and has not tried any therapies as of yet. - Weightbearing exercise as tolerated, Voltaren gel 4 times daily, Tylenol 1000 mg every 8 hours as needed, and ibuprofen 400 mg every 6 hours as needed with PPI if using more than 4-5 times a week    Patient discussed with Dr. Virl Cagey, DO [facility] Internal [facility] Resident PGY-2 Clinic Phone: [phone] Pager: [phone]

## 2023-03-24 NOTE — Assessment & Plan Note (Signed)
 Patient has chronic bilateral knee pain most consistent with osteoarthritis and has not tried any therapies as of yet. - Weightbearing exercise as tolerated, Voltaren gel 4 times daily, Tylenol 1000 mg every 8 hours as needed, and ibuprofen 400 mg every 6 hours as needed with PPI if using more than 4-5 times a week

## 2023-03-24 NOTE — Assessment & Plan Note (Addendum)
 Patient presents for hospital follow-up after being admitted from 03/07/2023 - 03/09/2023 for possible partial SBO that was treated conservatively and then presented to the ED on 03/10/2023 for the same abdominal pain.  Pain is primarily in the suprapubic/periumbilical region and is mostly triggered by certain foods and drinks such as oatmeal and orange soda.  Having a bowel movement will sometimes make it better but mostly he will lay down and wait for it to resolve.  He denies any constipation, bloody or tarry stools, nausea, vomiting, dysuria, flank pain, fevers, weight loss.  He also has recurrent gross hematuria due to radioactive seed implantation for prostate cancer and had a cystoscopy on 10/24/2022 which the patient reported showed that there was no evidence of bladder cancer however we do not have the records of this from urology.  His last colonoscopy on record was in 2011 and showed small ulcer in the transverse colon and diverticula.  On exam his abdomen is soft, nontender and has positive bowel sounds.  Overall the location of his pain and history of persistent bladder wall thickening is most concerning for a urologic cause of his pain but at his age and having worsening with some foods there is still concern for a GI component or even vascular component.  However his recent lipid panel was within normal limits and he does not have a significant cardiovascular history or symptoms to support angiography at this time.  We will also hold off on GI referral until we try to avoiding triggers and as needed pain management with over-the-counter therapies. We will have him follow-up with urology for reevaluation.  Return precautions discussed.  - Avoid triggering foods and drinks and use Tylenol/ibuprofen as needed for pain - Follow-up with urology

## 2023-03-24 NOTE — Progress Notes (Signed)
 [facility] Attending  Case discussed with the resident at the time of the visit.  We reviewed the resident's history and exam and pertinent patient test results.  I agree with the assessment, diagnosis, and plan of care documented in the resident's note.

## 2023-03-24 NOTE — Patient Instructions (Signed)
  Thank you, [name], for allowing [place] to provide your care today. Today we discussed . . .  > Abdominal pain       -For your abdominal pain I would first like to try some symptomatic treatment before sending you for a procedure like a colonoscopy.  If you do notice anything other than orange soda that triggers her abdominal pain I would recommend that you try to avoid these foods.  Please also try either Tylenol 1000 mg every 8 hours as needed or ibuprofen 400 mg every 6 hours as needed for your abdominal pain.  If you are taking ibuprofen every day for pain please let us know and we will add a second medication that can help protect you from ulcers in your stomach.  I would also like you to follow-up with your urologist to see if they would like to look in your bladder again due to the recurrent pain you are having.  If this pain continues to be bothersome and to the urologist does not have any explanation for it we will talk about sending you to the gastroenterologist for a colonoscopy.  If you have any new symptoms with this abdominal pain like bloody or really dark tarry stools, inability to have a bowel movement, fevers, or weight loss please let us know. > Knee pain       -Your knee pain is most likely due to osteoarthritis and the most important thing to prevent this from getting worse as regular weightbearing exercise such as walking.  You can also use ice or heat over your knees as needed, Voltaren (diclofenac) gel 4 times daily in your knees, and as above you can take Tylenol 1000 mg every 8 hours or ibuprofen 400 mg every 6 hours for pain.  Again if you are taking ibuprofen more than 4 times a week please let us know and we can prescribe a medicine that will reduce the risk of stomach ulcers.   Follow up: 4-6 months    Remember:     Should you have any questions or concerns please call the internal medicine clinic at [phone].     Rocky Morel, DO [facility]

## 2023-09-18 ENCOUNTER — Emergency Department (HOSPITAL_COMMUNITY)

## 2023-09-18 ENCOUNTER — Other Ambulatory Visit: Payer: Self-pay

## 2023-09-18 ENCOUNTER — Inpatient Hospital Stay (HOSPITAL_COMMUNITY)
Admission: EM | Admit: 2023-09-18 | Discharge: 2023-09-20 | DRG: 389 | Disposition: A | Discharge status: Home / Self Care | Attending: Internal Medicine | Admitting: Internal Medicine

## 2023-09-18 ENCOUNTER — Observation Stay (HOSPITAL_COMMUNITY)

## 2023-09-18 ENCOUNTER — Encounter (HOSPITAL_COMMUNITY): Payer: Self-pay

## 2023-09-18 DIAGNOSIS — Z9079 Acquired absence of other genital organ(s): Secondary | ICD-10-CM

## 2023-09-18 DIAGNOSIS — I10 Essential (primary) hypertension: Secondary | ICD-10-CM | POA: Diagnosis present

## 2023-09-18 DIAGNOSIS — K56600 Partial intestinal obstruction, unspecified as to cause: Secondary | ICD-10-CM

## 2023-09-18 DIAGNOSIS — M436 Torticollis: Secondary | ICD-10-CM | POA: Diagnosis present

## 2023-09-18 DIAGNOSIS — Z8719 Personal history of other diseases of the digestive system: Secondary | ICD-10-CM

## 2023-09-18 DIAGNOSIS — Z86711 Personal history of pulmonary embolism: Secondary | ICD-10-CM

## 2023-09-18 DIAGNOSIS — Z923 Personal history of irradiation: Secondary | ICD-10-CM

## 2023-09-18 DIAGNOSIS — K5730 Diverticulosis of large intestine without perforation or abscess without bleeding: Secondary | ICD-10-CM | POA: Diagnosis present

## 2023-09-18 DIAGNOSIS — K4020 Bilateral inguinal hernia, without obstruction or gangrene, not specified as recurrent: Secondary | ICD-10-CM | POA: Diagnosis present

## 2023-09-18 DIAGNOSIS — J4520 Mild intermittent asthma, uncomplicated: Secondary | ICD-10-CM | POA: Diagnosis present

## 2023-09-18 DIAGNOSIS — K56609 Unspecified intestinal obstruction, unspecified as to partial versus complete obstruction: Secondary | ICD-10-CM | POA: Diagnosis not present

## 2023-09-18 DIAGNOSIS — G8929 Other chronic pain: Secondary | ICD-10-CM | POA: Diagnosis present

## 2023-09-18 DIAGNOSIS — Z87891 Personal history of nicotine dependence: Secondary | ICD-10-CM

## 2023-09-18 DIAGNOSIS — E785 Hyperlipidemia, unspecified: Secondary | ICD-10-CM | POA: Diagnosis present

## 2023-09-18 DIAGNOSIS — N400 Enlarged prostate without lower urinary tract symptoms: Secondary | ICD-10-CM | POA: Diagnosis present

## 2023-09-18 DIAGNOSIS — Z86718 Personal history of other venous thrombosis and embolism: Secondary | ICD-10-CM

## 2023-09-18 DIAGNOSIS — Z8249 Family history of ischemic heart disease and other diseases of the circulatory system: Secondary | ICD-10-CM

## 2023-09-18 DIAGNOSIS — Z8546 Personal history of malignant neoplasm of prostate: Secondary | ICD-10-CM

## 2023-09-18 DIAGNOSIS — N179 Acute kidney failure, unspecified: Principal | ICD-10-CM

## 2023-09-18 DIAGNOSIS — M1A9XX0 Chronic gout, unspecified, without tophus (tophi): Secondary | ICD-10-CM | POA: Diagnosis present

## 2023-09-18 HISTORY — DX: Partial intestinal obstruction, unspecified as to cause: K56.600

## 2023-09-18 LAB — URINALYSIS, ROUTINE W REFLEX MICROSCOPIC
Bacteria, UA: NONE SEEN
Bilirubin Urine: NEGATIVE
Glucose, UA: NEGATIVE mg/dL
Ketones, ur: 5 mg/dL — AB
Leukocytes,Ua: NEGATIVE
Nitrite: NEGATIVE
Protein, ur: NEGATIVE mg/dL
Specific Gravity, Urine: 1.044 — ABNORMAL HIGH (ref 1.005–1.030)
pH: 5 (ref 5.0–8.0)

## 2023-09-18 LAB — CBC WITH DIFFERENTIAL/PLATELET
Abs Immature Granulocytes: 0.02 K/uL (ref 0.00–0.07)
Basophils Absolute: 0 K/uL (ref 0.0–0.1)
Basophils Relative: 1 %
Eosinophils Absolute: 0 K/uL (ref 0.0–0.5)
Eosinophils Relative: 1 %
HCT: 36.5 % — ABNORMAL LOW (ref 39.0–52.0)
Hemoglobin: 12.1 g/dL — ABNORMAL LOW (ref 13.0–17.0)
Immature Granulocytes: 0 %
Lymphocytes Relative: 24 %
Lymphs Abs: 1.4 K/uL (ref 0.7–4.0)
MCH: 26.9 pg (ref 26.0–34.0)
MCHC: 33.2 g/dL (ref 30.0–36.0)
MCV: 81.1 fL (ref 80.0–100.0)
Monocytes Absolute: 0.7 K/uL (ref 0.1–1.0)
Monocytes Relative: 12 %
Neutro Abs: 3.6 K/uL (ref 1.7–7.7)
Neutrophils Relative %: 62 %
Platelets: 183 K/uL (ref 150–400)
RBC: 4.5 MIL/uL (ref 4.22–5.81)
RDW: 15.1 % (ref 11.5–15.5)
WBC: 5.7 K/uL (ref 4.0–10.5)
nRBC: 0 % (ref 0.0–0.2)

## 2023-09-18 LAB — COMPREHENSIVE METABOLIC PANEL WITH GFR
ALT: 19 U/L (ref 0–44)
AST: 39 U/L (ref 15–41)
Albumin: 3.6 g/dL (ref 3.5–5.0)
Alkaline Phosphatase: 50 U/L (ref 38–126)
Anion gap: 14 (ref 5–15)
BUN: 15 mg/dL (ref 8–23)
CO2: 20 mmol/L — ABNORMAL LOW (ref 22–32)
Calcium: 9.1 mg/dL (ref 8.9–10.3)
Chloride: 100 mmol/L (ref 98–111)
Creatinine, Ser: 1.44 mg/dL — ABNORMAL HIGH (ref 0.61–1.24)
GFR, Estimated: 49 mL/min — ABNORMAL LOW (ref >60)
Glucose, Bld: 97 mg/dL (ref 70–99)
Potassium: 3.9 mmol/L (ref 3.5–5.1)
Sodium: 134 mmol/L — ABNORMAL LOW (ref 135–145)
Total Bilirubin: 1.2 mg/dL (ref 0.0–1.2)
Total Protein: 7 g/dL (ref 6.5–8.1)

## 2023-09-18 LAB — LIPASE, BLOOD: Lipase: 35 U/L (ref 11–51)

## 2023-09-18 LAB — TROPONIN I (HIGH SENSITIVITY): Troponin I (High Sensitivity): 14 ng/L (ref <18)

## 2023-09-18 MED ORDER — LIDOCAINE 5 % EX PTCH
1.0000 | MEDICATED_PATCH | Freq: Every day | CUTANEOUS | Status: DC | PRN
  Administered 2023-09-18: 1 via TRANSDERMAL
  Filled 2023-09-18: qty 1

## 2023-09-18 MED ORDER — LACTATED RINGERS IV SOLN: INTRAVENOUS | Status: AC

## 2023-09-18 MED ORDER — ACETAMINOPHEN 10 MG/ML IV SOLN
1000.0000 mg | Freq: Four times a day (QID) | INTRAVENOUS | Status: DC
  Administered 2023-09-18 – 2023-09-19 (×3): 1000 mg via INTRAVENOUS
  Filled 2023-09-18 (×3): qty 100

## 2023-09-18 MED ORDER — LACTATED RINGERS IV BOLUS
1000.0000 mL | Freq: Once | INTRAVENOUS | Status: AC
  Administered 2023-09-18: 1000 mL via INTRAVENOUS

## 2023-09-18 MED ORDER — ONDANSETRON HCL 4 MG/2ML IJ SOLN
4.0000 mg | Freq: Once | INTRAMUSCULAR | Status: AC
  Administered 2023-09-18: 4 mg via INTRAVENOUS
  Filled 2023-09-18: qty 2

## 2023-09-18 MED ORDER — DIATRIZOATE MEGLUMINE & SODIUM 66-10 % PO SOLN
90.0000 mL | Freq: Once | ORAL | Status: AC
  Administered 2023-09-18: 90 mL via NASOGASTRIC
  Filled 2023-09-18: qty 90

## 2023-09-18 MED ORDER — IOHEXOL 350 MG/ML SOLN
75.0000 mL | Freq: Once | INTRAVENOUS | Status: AC | PRN
  Administered 2023-09-18: 75 mL via INTRAVENOUS

## 2023-09-18 MED ORDER — ENOXAPARIN SODIUM 40 MG/0.4ML IJ SOSY
40.0000 mg | PREFILLED_SYRINGE | INTRAMUSCULAR | Status: DC
  Administered 2023-09-18 – 2023-09-19 (×2): 40 mg via SUBCUTANEOUS
  Filled 2023-09-18 (×2): qty 0.4

## 2023-09-18 MED ORDER — MORPHINE SULFATE (PF) 2 MG/ML IV SOLN
2.0000 mg | Freq: Once | INTRAVENOUS | Status: AC
  Administered 2023-09-18: 2 mg via INTRAVENOUS
  Filled 2023-09-18: qty 1

## 2023-09-18 MED ORDER — TRIMETHOBENZAMIDE HCL 100 MG/ML IM SOLN: 200.0000 mg | Freq: Four times a day (QID) | INTRAMUSCULAR | Status: DC | PRN

## 2023-09-18 NOTE — Consult Note (Signed)
 Consult Note  [name] January 14, 1941  [id number].    Requesting MD: Prentice Medicus, MD Chief Complaint/Reason for Consult: PSBO HPI:  Patient is an 83 year old male with PMH significant for HTN, HLD, OA, gout, diverticulitis, BPH, prostate cancer s/p radiation, radiation hematuria/cystitis, and hx of DVT/PE who presented to the ED today with abdominal pain, nausea, vomiting and diarrhea. He reports a component of chronic abdominal pain over the last 2 years in his central abdomen. Pain worsened a few days ago but became more generalized and severe around 1-2am this morning. He had 3 episodes of non-bloody diarrhea around this time. No flatus or BM since then. He reports n/v with last episode around 11am. He has been seen by general surgery earlier this year for rule out SBO after CT scan of the abdomen showed possible mesenteric swirling that appeared similar to prior CT scans and was tx w/ non-operative management. No prior abdominal surgery. NKDA. Not currently on any blood thinners but has previously been on for hx of DVT/PE (off for several years).   ROS: ROS As above, see hpi.   Family History  Problem Relation Age of Onset   Heart disease Father    Stroke Father    Heart disease Brother 33    Past Medical History:  Diagnosis Date   Benign localized prostatic hyperplasia with lower urinary tract symptoms (LUTS)    Chronic gout    followed by pcp   Full dentures    History of colitis 2005   per colonoscopy chronic inflammation colits secondary to ascending colon ulceration   History of diverticulitis of colon    History of DVT of lower extremity 09/13/2006   LLE   History of pulmonary embolism 09/13/2006   bilateral PE secondary to DVT of LLE in 2008 On Warfarin until 01/2010 when he was admitted for syncope and noticed that he had completed course of warfarin and it was stopped.   HTN (hypertension)    followed by pcp   (05-12-2023no medication since 01/ 2023 by pt's  pcp)   Hx of acute blood loss anemia due to radiation proctitis, hg normalized after transfusion 07/16/2021   Hx of aortic root dilation (HCC), resolved 01/10/2012   2012: Left ventricle: no LVH.  EF 60% to 65%.  Aorta: Mildly dilated aortic root. Aortic root dimension: 39mm  2015 : Aortic root: The aortic root was top normal in size.  2015 : Stable diffuse thoracic aortic ectasia with maximum diameter of the ascending aorta to 4.6 cm.  2017 ECHO ascending aorta nl in size     Hyperlipidemia    Malignant neoplasm prostate (HCC) 01/2021   urologist-- dr bell;  dx 01/ 2023, Gleason 4+4   Mild intermittent asthma    PFT's 05/2012 : FEV1 / FVC 75 and 78 pre and post BD. FEV1 74 and 75 pre and post BD. TLC 72. RV 93. DLCO 79. Read as restrictive lung disease, possible with obstructive lung disease. Prior smoker.     Mild intermittent asthma, uncomplicated 07/13/2013   PFT's 05/2012 : FEV1 / FVC 75 and 78 pre and post BD. FEV1 74 and 75 pre and post BD. TLC 72. RV 93. DLCO 79. Read as restrictive lung disease, possible with obstructive lung disease.  Prior smoker.    OA (osteoarthritis) of knee 11/05/2013   Radiation proctitis 07/28/2021   Marked showed marked rectal thickening and perirectal stranding, findings compatible with radiation proctitis. His blood and urine  culture showed no growth. He received Vanco, cefepime   and Flagyl  during hospitalization which was changed to Rocephin , and Flagyl . He was discharged on Omnicef  and Flagyl  to cover proctitis for 10 days for a total 14 days.    Recurrent gross hematuria due to radiation cystitis 10/03/2022   Syncope 07/16/2021    Past Surgical History:  Procedure Laterality Date   GOLD SEED IMPLANT N/A 06/13/2021   Procedure: GOLD SEED IMPLANT;  Surgeon: Carolee Sherwood JONETTA DOUGLAS, MD;  Location: [facility];  Service: Urology;  Laterality: N/A;   I & D EXTREMITY Right 03/04/2014   Procedure: IRRIGATION AND DEBRIDEMENT FINGER / HAND;  Surgeon:  Balinda Rogue, MD;  Location: [facility] OR;  Service: Plastics;  Laterality: Right;  I&D Right Small Finger and Right Wrist   KNEE ARTHROSCOPY Left 2000   SPACE OAR INSTILLATION N/A 06/13/2021   Procedure: SPACE OAR INSTILLATION;  Surgeon: Carolee Sherwood JONETTA DOUGLAS, MD;  Location: [facility];  Service: Urology;  Laterality: N/A;   TRANSURETHRAL RESECTION OF PROSTATE  04/01/2003   @[facility]   by dr andra    Social History:  reports that he has never smoked. He has never used smokeless tobacco. He reports that he does not drink alcohol and does not use drugs.  Allergies: No Known Allergies  (Not in a hospital admission)   Blood pressure (!) 99/57, pulse 66, temperature 98.9 F (37.2 C), temperature source Oral, resp. rate 16, height 5' 11 (1.803 m), weight 74.8 kg, SpO2 98%. Physical Exam:  General: pleasant, WD, male who is laying in bed in NAD HEENT: head is normocephalic, atraumatic.  Sclera are noninjected.   Heart: regular, rate, and rhythm.   Lungs: Respiratory effort nonlabored Abd: soft, mild distension, mild suprapubic and LLQ ttp without rigidity or guarding and is otherwise NT. Skin: warm and dry  Psych: A&Ox3 with an appropriate affect.  Results for orders placed or performed during the hospital encounter of 09/18/23 (from the past 48 hours)  Urinalysis, Routine w reflex microscopic -Urine, Clean Catch     Status: Abnormal   Collection Time: 09/18/23  7:31 AM  Result Value Ref Range   Color, Urine YELLOW YELLOW   APPearance CLEAR CLEAR   Specific Gravity, Urine 1.044 (H) 1.005 - 1.030   pH 5.0 5.0 - 8.0   Glucose, UA NEGATIVE NEGATIVE mg/dL   Hgb urine dipstick MODERATE (A) NEGATIVE   Bilirubin Urine NEGATIVE NEGATIVE   Ketones, ur 5 (A) NEGATIVE mg/dL   Protein, ur NEGATIVE NEGATIVE mg/dL   Nitrite NEGATIVE NEGATIVE   Leukocytes,Ua NEGATIVE NEGATIVE   RBC / HPF 0-5 0 - 5 RBC/hpf   WBC, UA 0-5 0 - 5 WBC/hpf   Bacteria, UA NONE SEEN NONE SEEN   Squamous Epithelial /  HPF 0-5 0 - 5 /HPF   Mucus PRESENT     Comment: Performed at [facility], [address], [place], [place] [address]  CBC with Differential     Status: Abnormal   Collection Time: 09/18/23  7:35 AM  Result Value Ref Range   WBC 5.7 4.0 - 10.5 K/uL   RBC 4.50 4.22 - 5.81 MIL/uL   Hemoglobin 12.1 (L) 13.0 - 17.0 g/dL   HCT 63.4 (L) 60.9 - 47.9 %   MCV 81.1 80.0 - 100.0 fL   MCH 26.9 26.0 - 34.0 pg   MCHC 33.2 30.0 - 36.0 g/dL   RDW 84.8 88.4 - 84.4 %   Platelets 183 150 -  400 K/uL   nRBC 0.0 0.0 - 0.2 %   Neutrophils Relative % 62 %   Neutro Abs 3.6 1.7 - 7.7 K/uL   Lymphocytes Relative 24 %   Lymphs Abs 1.4 0.7 - 4.0 K/uL   Monocytes Relative 12 %   Monocytes Absolute 0.7 0.1 - 1.0 K/uL   Eosinophils Relative 1 %   Eosinophils Absolute 0.0 0.0 - 0.5 K/uL   Basophils Relative 1 %   Basophils Absolute 0.0 0.0 - 0.1 K/uL   Immature Granulocytes 0 %   Abs Immature Granulocytes 0.02 0.00 - 0.07 K/uL    Comment: Performed at [facility], [address] [place], [place] [address]  Comprehensive metabolic panel     Status: Abnormal   Collection Time: 09/18/23  7:35 AM  Result Value Ref Range   Sodium 134 (L) 135 - 145 mmol/L   Potassium 3.9 3.5 - 5.1 mmol/L   Chloride 100 98 - 111 mmol/L   CO2 20 (L) 22 - 32 mmol/L   Glucose, Bld 97 70 - 99 mg/dL    Comment: Glucose reference range applies only to samples taken after fasting for at least 8 hours.   BUN 15 8 - 23 mg/dL   Creatinine, Ser 8.55 (H) 0.61 - 1.24 mg/dL   Calcium 9.1 8.9 - 89.6 mg/dL   Total Protein 7.0 6.5 - 8.1 g/dL   Albumin 3.6 3.5 - 5.0 g/dL   AST 39 15 - 41 U/L   ALT 19 0 - 44 U/L   Alkaline Phosphatase 50 38 - 126 U/L   Total Bilirubin 1.2 0.0 - 1.2 mg/dL   GFR, Estimated 49 (L) >60 mL/min    Comment: (NOTE) Calculated using the CKD-EPI Creatinine Equation (2021)    Anion gap 14 5 - 15    Comment: Performed at [facility], [address], [place], [place] [address]  Lipase, blood      Status: None   Collection Time: 09/18/23  7:35 AM  Result Value Ref Range   Lipase 35 11 - 51 U/L    Comment: Performed at [facility], [address], [place], [place] [address]   CT ABDOMEN PELVIS W CONTRAST Result Date: 09/18/2023 CLINICAL DATA:  Abdominal pain, acute, nonlocalized. Nausea, vomiting and diarrhea. EXAM: CT ABDOMEN AND PELVIS WITH CONTRAST TECHNIQUE: Multidetector CT imaging of the abdomen and pelvis was performed using the standard protocol following bolus administration of intravenous contrast. RADIATION DOSE REDUCTION: This exam was performed according to the departmental dose-optimization program which includes automated exposure control, adjustment of the mA and/or kV according to patient size and/or use of iterative reconstruction technique. CONTRAST:  75mL OMNIPAQUE  IOHEXOL  350 MG/ML SOLN COMPARISON:  03/10/2023 FINDINGS: Lower chest: Lung bases are clear.  No pleural or pericardial fluid. Hepatobiliary: Liver parenchyma is unremarkable. Gallbladder is full but does not contain any calcified stones or show gross wall thickening. Pancreas: Normal Spleen: Normal Adrenals/Urinary Tract: Adrenal glands are normal. Bilateral renal cysts. No evidence of stone or obstruction. No imaging follow-up recommended for these cysts. Stomach/Bowel: Proximal small bowel appears normal. Mildly dilated fluid-filled mid ileum which could go along with a partial small bowel obstruction. No high-grade obstruction. Colon does not show any acute finding. Diverticulosis without visible diverticulitis. Some liquid stool. Bilateral inguinal hernia is that do not contain bowel. Vascular/Lymphatic: Aortic atherosclerosis. No aneurysm. IVC is normal. No adenopathy. Reproductive: Metallic markers in the prostate bed. Other: No free fluid or air. Musculoskeletal: Chronic  lumbar degenerative changes without acute finding. Chronic osteoarthritis of the hips. IMPRESSION: 1. Mildly dilated fluid-filled mid ileum  which could go along with a partial small bowel obstruction. No high-grade obstruction. 2. Diverticulosis without visible diverticulitis. 3. Bilateral inguinal hernias that do not contain bowel. 4. Aortic atherosclerosis. Aortic Atherosclerosis (ICD10-I70.0). Electronically Signed   By: Oneil Officer M.D.   On: 09/18/2023 08:52      Assessment/Plan PSBO vs enteritis  - CT today with mildly dilated fluid-filled mid ileum possible PSBO, bilateral inguinal hernias not containing bowel. No free fluid or air.  - No prior abdominal surgery. He has had radiation to his pelvis for prostate cancer - Afebrile. No tachycardia. BP 99/57 w/ AKI - getting IVF. WBC wnl. No peritonitis on exam.  - No indication for emergency surgery - Recommend NPO, NGT placement and SBO protocol  - Keep K >=4, Phos >= 3, Mg >= 2 and mobilize for bowel function. Okay to clamp NGT for mobilization.  - If any further loose stools would recommend GI panel as well  - Agree with medical admission. We will follow with you.   FEN: NPO, place NGT to LIWS, IVF per [facility] VTE: SCDs, ok to have LMWH or SQH from surgery standpoint  ID: no current clear indication for abx  - per [facility] -  HTN HLD OA Gout Hx of diverticulitis BPH prostate cancer s/p radiation radiation hematuria/cystitis hx of DVT/PE not currently on anticoagulation - has been off for years per pt report  I reviewed nursing notes, last 24 h vitals and pain scores, last 48 h intake and output, last 24 h labs and trends, and last 24 h imaging results.   Ozell CHRISTELLA Shaper, [facility] 09/18/2023, 1:40 PM Please see Amion for pager number during day hours 7:00am-4:30pm

## 2023-09-18 NOTE — Progress Notes (Signed)
 Assessed pt at bedside due to new findings on EKG. Pt's EKG showed T wave inversions in several anterior leads which were most prominent in v2-v3. These changes were not present in previous EKGs and the T-wave inversions were less than 2 mm. Repeat EKG remained unchanged.    Evaluated patient at bedside. Patient complained about chest pain only with cough that has been going on since he was admitted.  He denied any chest pain during other times.  Denied any shortness of breath.  He looked good overall.  On our CV exam patient had RRR-no murmurs and no reproducible chest wall tenderness. He pointed to his substernal and left side of the chest for area of pain. Although pt's CP is atypical, with the new EKG changes, we will get a troponin check for further evaluation.   Blood pressure (!) 141/64, pulse 69, temperature 98.2 F (36.8 C), temperature source Oral, resp. rate 17, height 5' 11 (1.803 m), weight 74.8 kg, SpO2 94%.

## 2023-09-18 NOTE — ED Triage Notes (Signed)
 Pt arrived EMS from home w/ c/o abdominal pain w/n/v/d x 2 days.

## 2023-09-18 NOTE — H&P (Cosign Needed Addendum)
 Date: 09/18/2023               Patient Name:  [name] MRN: [id number]  DOB: 26-Feb-1940 Age / Sex: 83 y.o., male   PCP: [name], MD         Medical Service: Internal Medicine Teaching Service         Attending Physician: Dr. MICAEL Riis Winfrey      First Contact: Letha Cheadle, MD    Second Contact: Dr. Fairy Pool, DO          Pager Information: First Contact Pager: [phone]   Second Contact Pager: [phone]   SUBJECTIVE   Chief Complaint: Abdominal pain  History of Present Illness  [name] is a 83 y.o. male with PMHx of prostate cancer, diverticulitis, hypertension, hyperlipidemia, gout who presents for evaluation of abdominal pain and diarrhea onset last night. The patient states that he has had abdominal soreness and a stiff neck for a long time. However, his abdominal pain acutely worsened last night and this was followed by 4-5 episodes of loose stools since last night. He localizes his abdominal pain to around his navel. The patient also states that he has had some nausea and vomited once at 11 AM today. He did not notice any blood in his emesis or stool. He says not much as alleviated his pain, but does note that sometimes it will improve when he repositions himself on his side while lying down. Notes he last ate yesterday without any problems. Denies chest pain, shortness of breath, hematochezia, or hematemesis. He says his neck stiffness is new and he is only able to relax when he rests on a pillow. He denies any trauma to his neck.  Of note, the patient has a prior admission on 03/07/23 for similar abdominal pain and found to have a partial small bowel obstruction.  ED Course:  Labs significant for Scr of 1.44, hematuria on UA Imaging: CT abd/pelvis c/f partial sbo, bilateral inguinal hernias w/o bowel Received 1L LR, 4mg  zofran  - bowel rest: NPO, NG tube Consults: General Surgery  Meds  Patient denies taking any medications  Allergies   Allergies as of 09/18/2023   (No Known Allergies)   Past Medical History BPH Prostate Cancer s/p radiation Radiation prostatitis Diverticulitis Gout HTN HLD Osteoarthritis  Past Surgical History Past Surgical History:  Procedure Laterality Date   GOLD SEED IMPLANT N/A 06/13/2021   Procedure: GOLD SEED IMPLANT;  Surgeon: Carolee Sherwood JONETTA DOUGLAS, MD;  Location: [facility];  Service: Urology;  Laterality: N/A;   I & D EXTREMITY Right 03/04/2014   Procedure: IRRIGATION AND DEBRIDEMENT FINGER / HAND;  Surgeon: Balinda Rogue, MD;  Location: [facility];  Service: Plastics;  Laterality: Right;  I&D Right Small Finger and Right Wrist   KNEE ARTHROSCOPY Left 2000   SPACE OAR INSTILLATION N/A 06/13/2021   Procedure: SPACE OAR INSTILLATION;  Surgeon: Carolee Sherwood JONETTA DOUGLAS, MD;  Location: [facility];  Service: Urology;  Laterality: N/A;   TRANSURETHRAL RESECTION OF PROSTATE  04/01/2003   @[facility]   by dr andra   Social History  Living Situation: lives at home with his son Occupation: retired; previously worked in [employer] Support: Son Level of Function: Independent, able to complete all ADLs and IADLs; can walk without assistive devices PCP: [name], MD  Substances: -Tobacco: former smoker; smoked 1 ppd for 3-4 years but quit ~40 years ago -Alcohol: Denies current use; last used 40 years ago -  Recreational Drug: denies  Family History  Family History  Problem Relation Age of Onset   Heart disease Father    Stroke Father    Heart disease Brother 63     Review of Systems  A complete ROS was negative except as per HPI.   OBJECTIVE:   Physical Exam: Blood pressure (!) 99/57, pulse 66, temperature 98.9 F (37.2 C), temperature source Oral, resp. rate 16, height 5' 11 (1.803 m), weight 74.8 kg, SpO2 98%.  Constitutional: well-appearing, well-nourished, lying in hospital bed in no acute distress; soft-spoken HENT: normocephalic atraumatic, mucous  membranes moist Eyes: conjunctiva non-erythematous, PERRL, no scleral icterus Neck: supple without lesions, thyroid non-enlarged and non-tender Cardiovascular: regular rate and rhythm, no m/r/g Pulmonary/Chest: normal work of breathing on room air, lungs clear to auscultation bilaterally Abdominal: soft, non-tender to palpation in all four quadrants; mild pain with suprapubic palpation, masses non-distended, bowel sounds normal MSK: normal bulk and tone, cervical spine non-tender to palpation; neck extension limited due to pain, but ROM of rotation and flexion normal. Neurological: alert & oriented x3; required redirection to questions but appropriately responded after repeating the question, 5/5 strength in bilateral upper and lower extremities, normal gait Skin: warm and dry Extremities: no edema or cyanosis; peripheral pulses intact; MTP joints swollen without erythema/warmth bilaterally Psych: normal mood and affect, thought content normal  Labs: CBC    Component Value Date/Time   WBC 5.7 09/18/2023 0735   RBC 4.50 09/18/2023 0735   HGB 12.1 (L) 09/18/2023 0735   HGB 13.9 07/26/2021 1459   HCT 36.5 (L) 09/18/2023 0735   HCT 42.1 07/26/2021 1459   PLT 183 09/18/2023 0735   PLT 360 07/26/2021 1459   MCV 81.1 09/18/2023 0735   MCV 87 07/26/2021 1459   MCH 26.9 09/18/2023 0735   MCHC 33.2 09/18/2023 0735   RDW 15.1 09/18/2023 0735   RDW 14.1 07/26/2021 1459   LYMPHSABS 1.4 09/18/2023 0735   LYMPHSABS 0.6 (L) 07/26/2021 1459   MONOABS 0.7 09/18/2023 0735   EOSABS 0.0 09/18/2023 0735   EOSABS 0.3 07/26/2021 1459   BASOSABS 0.0 09/18/2023 0735   BASOSABS 0.0 07/26/2021 1459     CMP     Component Value Date/Time   NA 134 (L) 09/18/2023 0735   NA 136 07/26/2021 1459   K 3.9 09/18/2023 0735   CL 100 09/18/2023 0735   CO2 20 (L) 09/18/2023 0735   GLUCOSE 97 09/18/2023 0735   BUN 15 09/18/2023 0735   BUN 15 07/26/2021 1459   CREATININE 1.44 (H) 09/18/2023 0735   CREATININE  1.05 03/15/2014 1546   CALCIUM 9.1 09/18/2023 0735   PROT 7.0 09/18/2023 0735   PROT 7.0 07/26/2021 1459   ALBUMIN 3.6 09/18/2023 0735   ALBUMIN 4.0 07/26/2021 1459   AST 39 09/18/2023 0735   ALT 19 09/18/2023 0735   ALKPHOS 50 09/18/2023 0735   BILITOT 1.2 09/18/2023 0735   BILITOT 0.2 07/26/2021 1459   GFRNONAA 49 (L) 09/18/2023 0735   GFRNONAA 52 (L) 02/03/2014 1111   GFRAA 64 04/15/2019 1002   GFRAA 60 02/03/2014 1111    Imaging: DG Abd Portable 1V-Small Bowel Protocol-Position Verification Result Date: 09/18/2023 CLINICAL DATA:  Nasogastric tube placement. EXAM: PORTABLE ABDOMEN - 1 VIEW COMPARISON:  Abdomen and pelvis CT dated 09/18/2023 FINDINGS: Interval nasogastric tube with its tip and side hole in the proximal stomach. Normal bowel-gas pattern. Lumbar and lower thoracic spine degenerative changes. IMPRESSION: Nasogastric tube tip and side hole in the proximal  stomach. Electronically Signed   By: Elspeth Bathe M.D.   On: 09/18/2023 15:37   CT ABDOMEN PELVIS W CONTRAST Result Date: 09/18/2023 CLINICAL DATA:  Abdominal pain, acute, nonlocalized. Nausea, vomiting and diarrhea. EXAM: CT ABDOMEN AND PELVIS WITH CONTRAST TECHNIQUE: Multidetector CT imaging of the abdomen and pelvis was performed using the standard protocol following bolus administration of intravenous contrast. RADIATION DOSE REDUCTION: This exam was performed according to the departmental dose-optimization program which includes automated exposure control, adjustment of the mA and/or kV according to patient size and/or use of iterative reconstruction technique. CONTRAST:  75mL OMNIPAQUE  IOHEXOL  350 MG/ML SOLN COMPARISON:  03/10/2023 FINDINGS: Lower chest: Lung bases are clear.  No pleural or pericardial fluid. Hepatobiliary: Liver parenchyma is unremarkable. Gallbladder is full but does not contain any calcified stones or show gross wall thickening. Pancreas: Normal Spleen: Normal Adrenals/Urinary Tract: Adrenal glands are  normal. Bilateral renal cysts. No evidence of stone or obstruction. No imaging follow-up recommended for these cysts. Stomach/Bowel: Proximal small bowel appears normal. Mildly dilated fluid-filled mid ileum which could go along with a partial small bowel obstruction. No high-grade obstruction. Colon does not show any acute finding. Diverticulosis without visible diverticulitis. Some liquid stool. Bilateral inguinal hernia is that do not contain bowel. Vascular/Lymphatic: Aortic atherosclerosis. No aneurysm. IVC is normal. No adenopathy. Reproductive: Metallic markers in the prostate bed. Other: No free fluid or air. Musculoskeletal: Chronic lumbar degenerative changes without acute finding. Chronic osteoarthritis of the hips. IMPRESSION: 1. Mildly dilated fluid-filled mid ileum which could go along with a partial small bowel obstruction. No high-grade obstruction. 2. Diverticulosis without visible diverticulitis. 3. Bilateral inguinal hernias that do not contain bowel. 4. Aortic atherosclerosis. Aortic Atherosclerosis (ICD10-I70.0). Electronically Signed   By: Oneil Officer M.D.   On: 09/18/2023 08:52    EKG: personally reviewed my interpretation is normal sinus rhythm with left axis deviation and prolonged QTc. Prior EKG with similar findings.  ASSESSMENT & PLAN:   Assessment & Plan by Problem: Principal Problem:   Partial bowel obstruction (HCC)   [name] is a male living with a history of prostate cancer, diverticulitis, OA, gout, HTN, HLD, asthma who presented with abdominal pain with N/V/D and was admitted for partial small bowel obstruction on hospital day 0.  #Partial Small Bowel Obstruction Patient presenting with acute abdominal pain with N/V/D. Lipase is normal. No leukocytosis. Blood pressure was a little soft down to 95/49 but improved with fluids. Otherwise vital signs stable. Abdominal exam was benign. No tenderness to palpation. Soft, nondistended, no masses palpated. CT  abdomen/pelvis showing mild dilation of mid-ileum which could be related to a partial small bowel obstruction.  Diverticulosis and bilateral inguinal hernias not containing bowel was also seen.He has a history of prior partial small bowel obstruction including admission in Feb 2025. Given his history of radiation for prostate adenocarcinoma, this could be a cause of mechanical obstruction as he lacks any surgical history. ED provider consulted general surgery who deemed that no acute surgical interventions were needed. They recommended bowel rest. I don't think he is having any acute infectious process and will likely improve with conservative management. -General Surgery recs:  -No indication for urgent surgical intervention -NGT to LWS - confirmed placement with CXR on 09/18/23 -NPO with MIVF -Will plan for SB protocol -s/p 1L LR bolus -maintenance fluids: LR at 125ml/hr for 16 hours -Tigan  q6h prn for nausea  #AKI Scr of 1.44 on admission with a baseline of ~1. Given history of abdominal pain,  diarrhea, and vomiting this is likely pre-renal in the setting of decreased PO intake and volume loss. He received a fluid bolus in the ED and is on maintenance fluids. We will trend BMP for improvement. -avoid nephrotoxic medications -trend BMPs  #Prostatic adenocarcinoma s/p seed implant and XRT #History of BPH s/p TURP #History of cystitis Patient noted increased urinary frequency. He has a prior history of BPH s/p TURP as well as prostate adenocarcinoma s/p gold seed implants on 06/13/2021 and radiation therapy from 09/05/21-09/10/21. 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 UA today with moderate blood but minimal RBCs seen. No concern for acute infection at this time but will reassess. -continue to monitor  #Bilateral inguinal hernias seen on imaging #Diverticulosis with history of  diverticulitis Bilateral inguinal hernias and diverticulosis seen on imaging but his symptomology is not consistent with this as a cause for his abdominal pain. He does not have a history of hernia incarceration and based on current physical exam I have extremely low suspicion for this. Presentation is also not consistent with diverticulitis. He has been afebrile and with normal WBC. Will continue to monitor and reassess for changes in clinical condition. -continue to monitor  #Neck pain/stiffness The patient does have some neck pain with neck extension. Neck muscles were tight until properly placed pillow. No pain with palpation. Range of motion is normal with rotation. Likely muscular in nature. After placing a pillow under his head he was able to relax his neck with some relief of his soreness. Added prn lidocaine  patches and will consider IV Ofirmev  while he is NPO for bowel rest. -Lidocaine  patch prn  #Prolonged QTc on EKG EKG today with QTc of 559, however there was a good amount of artifact so will repeat EKG. He received one dose of Zofran  in the ED. Added tigan  IM for prn nausea. -avoid QT prolonging medications -repeat EKG pending -Tigan  IM for nausea  Best practice: Diet: NPO VTE: Enoxaparin  IVF: LR,Bolus Code: Full  Disposition planning: Prior to Admission Living Arrangement: Home Anticipated Discharge Location: Home  Dispo: Admit patient to Observation with expected length of stay less than 2 midnights.  Signed: Cai Anfinson, MD [facility]  PGY-1 09/18/2023, 5:44 PM  Please contact IM Residency On-Call Pager at: [phone]

## 2023-09-18 NOTE — ED Provider Notes (Signed)
 [place] EMERGENCY DEPARTMENT AT [facility] Provider Note   CSN: [id number] Arrival date & time: 09/18/23  [id number]     Patient presents with: Abdominal Pain and Diarrhea   [name] is a 83 y.o. male.   83 year old male with past medical history of small bowel obstructions in the past as well as hypertension and diverticulitis presenting to the emergency department today with abdominal pain, vomiting, and diarrhea.  The patient is somewhat of a poor historian.  Reports symptoms have been going now for the past few days.  Denies any blood in his stool or dark stool.  States that this does feel similar to when he has had a bowel obstruction in the past although he is having some loose stools.  He denies any associated urinary symptoms.   Abdominal Pain Associated symptoms: diarrhea   Diarrhea Associated symptoms: abdominal pain        Prior to Admission medications   Not on File    Allergies: Patient has no known allergies.    Review of Systems  Gastrointestinal:  Positive for abdominal pain and diarrhea.  All other systems reviewed and are negative.   Updated Vital Signs BP (!) 99/57   Pulse 66   Temp 98.9 F (37.2 C) (Oral)   Resp 16   Ht 5' 11 (1.803 m)   Wt 74.8 kg   SpO2 98%   BMI 23.01 kg/m   Physical Exam Vitals and nursing note reviewed.   Gen: Appears mildly uncomfortable, vomiting intermittently during interview Eyes: PERRL, EOMI HEENT: no oropharyngeal swelling Neck: trachea midline Resp: clear to auscultation bilaterally Card: RRR, no murmurs, rubs, or gallops Abd: Mild diffuse tenderness with no guarding or rebound Extremities: no calf tenderness, no edema Vascular: 2+ radial pulses bilaterally, 2+ DP pulses bilaterally Skin: no rashes Psyc: acting appropriately   (all labs ordered are listed, but only abnormal results are displayed) Labs Reviewed  CBC WITH DIFFERENTIAL/PLATELET - Abnormal; Notable for the following  components:      Result Value   Hemoglobin 12.1 (*)    HCT 36.5 (*)    All other components within normal limits  COMPREHENSIVE METABOLIC PANEL WITH GFR - Abnormal; Notable for the following components:   Sodium 134 (*)    CO2 20 (*)    Creatinine, Ser 1.44 (*)    GFR, Estimated 49 (*)    All other components within normal limits  URINALYSIS, ROUTINE W REFLEX MICROSCOPIC - Abnormal; Notable for the following components:   Specific Gravity, Urine 1.044 (*)    Hgb urine dipstick MODERATE (*)    Ketones, ur 5 (*)    All other components within normal limits  LIPASE, BLOOD    EKG: None  Radiology: CT ABDOMEN PELVIS W CONTRAST Result Date: 09/18/2023 CLINICAL DATA:  Abdominal pain, acute, nonlocalized. Nausea, vomiting and diarrhea. EXAM: CT ABDOMEN AND PELVIS WITH CONTRAST TECHNIQUE: Multidetector CT imaging of the abdomen and pelvis was performed using the standard protocol following bolus administration of intravenous contrast. RADIATION DOSE REDUCTION: This exam was performed according to the departmental dose-optimization program which includes automated exposure control, adjustment of the mA and/or kV according to patient size and/or use of iterative reconstruction technique. CONTRAST:  75mL OMNIPAQUE  IOHEXOL  350 MG/ML SOLN COMPARISON:  03/10/2023 FINDINGS: Lower chest: Lung bases are clear.  No pleural or pericardial fluid. Hepatobiliary: Liver parenchyma is unremarkable. Gallbladder is full but does not contain any calcified stones or show gross wall thickening. Pancreas: Normal Spleen: Normal  Adrenals/Urinary Tract: Adrenal glands are normal. Bilateral renal cysts. No evidence of stone or obstruction. No imaging follow-up recommended for these cysts. Stomach/Bowel: Proximal small bowel appears normal. Mildly dilated fluid-filled mid ileum which could go along with a partial small bowel obstruction. No high-grade obstruction. Colon does not show any acute finding. Diverticulosis without  visible diverticulitis. Some liquid stool. Bilateral inguinal hernia is that do not contain bowel. Vascular/Lymphatic: Aortic atherosclerosis. No aneurysm. IVC is normal. No adenopathy. Reproductive: Metallic markers in the prostate bed. Other: No free fluid or air. Musculoskeletal: Chronic lumbar degenerative changes without acute finding. Chronic osteoarthritis of the hips. IMPRESSION: 1. Mildly dilated fluid-filled mid ileum which could go along with a partial small bowel obstruction. No high-grade obstruction. 2. Diverticulosis without visible diverticulitis. 3. Bilateral inguinal hernias that do not contain bowel. 4. Aortic atherosclerosis. Aortic Atherosclerosis (ICD10-I70.0). Electronically Signed   By: Oneil Officer M.D.   On: 09/18/2023 08:52     Procedures   Medications Ordered in the ED  lactated ringers  bolus 1,000 mL (has no administration in time range)  morphine  (PF) 2 MG/ML injection 2 mg (2 mg Intravenous Given 09/18/23 0805)  ondansetron  (ZOFRAN ) injection 4 mg (4 mg Intravenous Given 09/18/23 0805)  iohexol  (OMNIPAQUE ) 350 MG/ML injection 75 mL (75 mLs Intravenous Contrast Given 09/18/23 0845)                                    Medical Decision Making 83 year old male with past medical history of diverticulitis, hypertension, and bowel obstruction in the past presenting to the emergency department today with abdominal pain and vomiting.  I will further evaluate the patient here with basic labs including LFTs and lipase to evaluate for hepatobiliary pathology or pancreatitis.  Will obtain a CT scan of his abdomen to evaluate for diverticulitis, recurrent bowel obstruction, or other intra-abdominal pathology.  Will give him morphine  and Zofran  for his symptoms and reevaluate for ultimate disposition.  The patient's labs are revealing for a mild AKI.  His CT scan shows partial small bowel obstruction.  Call discussed this with surgery.  They recommend medical admission for bowel rest  given the recurrence of this.  The patient blood pressure on reassessment is 99/57.  Additional IV fluids ordered.  The patient will be admitted for further evaluation and management.  Amount and/or Complexity of Data Reviewed Labs: ordered. Radiology: ordered.  Risk Prescription drug management. Decision regarding hospitalization.        Final diagnoses:  AKI (acute kidney injury) (HCC)  SBO (small bowel obstruction) [facility])    ED Discharge Orders     None          Ula Prentice SAUNDERS, MD 09/18/23 [phone]

## 2023-09-18 NOTE — Hospital Course (Addendum)
 [name] is a 83 y.o. with a pertinent PMH of diverticulitis, prostate adenocarcinoma s/p XRT, HTN, HLD, and gout who presented with abdominal pain with N/V/D and admitted for partial small bowel obstruction.   #Partial Small Bowel Obstruction Patient presented with worsening abdominal pain and nausea with recent prior SBO in February 2025.  CT in the ED concerning for partial small bowel obstruction.  Surgery was consulted and NG tube was placed on the day of admission.  The following day he remained stable and NG tube was clamped and then removed.  He was put on a clear liquid diet and advance to a full liquid diet within 24 hours before discharging.  He had 2 bowel movements prior to discharge and no return of his pain.  He has close follow-up with internal medicine center on 09/23/2023.   #T-wave inversions on EKG Initial EKG with QTc of 559, however there was significant artifact present. Repeat showed normal QTc however there were new T-wave inversions present in leads V2-V4. Patient c/o CP only with cough which is a chronic finding for him. Troponins were flat 14->13, low suspicion for ACS as pattern of T-wave inversions do not correspond with coronary arterial supply.   #AKI, improving Scr of 1.44 on admission with a baseline of ~1.  Improved to baseline prior to discharge and was likely due to decreased oral intake due to pain from small bowel obstruction.     #Prostatic adenocarcinoma s/p seed implant and XRT #History of BPH s/p TURP #History of cystitis He has a prior history of BPH s/p TURP as well as prostate adenocarcinoma s/p gold seed implants on 06/13/2021 and radiation therapy from 09/05/21-09/10/21. 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 UA today with moderate blood but minimal RBCs seen. No concern for acute issue and recommend outpatient urology follow-up.   #Bilateral  inguinal hernias seen on imaging #Diverticulosis with history of diverticulitis Bilateral inguinal hernias and diverticulosis seen on imaging without incarceration or diverticulitis.

## 2023-09-18 NOTE — Plan of Care (Signed)
   Problem: Coping: Goal: Level of anxiety will decrease Outcome: Progressing   Problem: Elimination: Goal: Will not experience complications related to bowel motility Outcome: Progressing

## 2023-09-19 ENCOUNTER — Observation Stay (HOSPITAL_COMMUNITY)

## 2023-09-19 DIAGNOSIS — Z923 Personal history of irradiation: Secondary | ICD-10-CM | POA: Diagnosis not present

## 2023-09-19 DIAGNOSIS — M542 Cervicalgia: Secondary | ICD-10-CM

## 2023-09-19 DIAGNOSIS — Z8546 Personal history of malignant neoplasm of prostate: Secondary | ICD-10-CM | POA: Diagnosis not present

## 2023-09-19 DIAGNOSIS — K56609 Unspecified intestinal obstruction, unspecified as to partial versus complete obstruction: Secondary | ICD-10-CM

## 2023-09-19 DIAGNOSIS — I10 Essential (primary) hypertension: Secondary | ICD-10-CM | POA: Diagnosis present

## 2023-09-19 DIAGNOSIS — N400 Enlarged prostate without lower urinary tract symptoms: Secondary | ICD-10-CM | POA: Diagnosis present

## 2023-09-19 DIAGNOSIS — Z86711 Personal history of pulmonary embolism: Secondary | ICD-10-CM | POA: Diagnosis not present

## 2023-09-19 DIAGNOSIS — M436 Torticollis: Secondary | ICD-10-CM | POA: Diagnosis present

## 2023-09-19 DIAGNOSIS — R9431 Abnormal electrocardiogram [ECG] [EKG]: Secondary | ICD-10-CM | POA: Diagnosis not present

## 2023-09-19 DIAGNOSIS — N179 Acute kidney failure, unspecified: Principal | ICD-10-CM

## 2023-09-19 DIAGNOSIS — G8929 Other chronic pain: Secondary | ICD-10-CM | POA: Diagnosis present

## 2023-09-19 DIAGNOSIS — E785 Hyperlipidemia, unspecified: Secondary | ICD-10-CM | POA: Diagnosis present

## 2023-09-19 DIAGNOSIS — K4020 Bilateral inguinal hernia, without obstruction or gangrene, not specified as recurrent: Secondary | ICD-10-CM | POA: Diagnosis present

## 2023-09-19 DIAGNOSIS — J4520 Mild intermittent asthma, uncomplicated: Secondary | ICD-10-CM | POA: Diagnosis present

## 2023-09-19 DIAGNOSIS — Z86718 Personal history of other venous thrombosis and embolism: Secondary | ICD-10-CM | POA: Diagnosis not present

## 2023-09-19 DIAGNOSIS — Z87891 Personal history of nicotine dependence: Secondary | ICD-10-CM | POA: Diagnosis not present

## 2023-09-19 DIAGNOSIS — Z8249 Family history of ischemic heart disease and other diseases of the circulatory system: Secondary | ICD-10-CM | POA: Diagnosis not present

## 2023-09-19 DIAGNOSIS — M1A9XX0 Chronic gout, unspecified, without tophus (tophi): Secondary | ICD-10-CM | POA: Diagnosis present

## 2023-09-19 DIAGNOSIS — Z87440 Personal history of urinary (tract) infections: Secondary | ICD-10-CM

## 2023-09-19 DIAGNOSIS — K56600 Partial intestinal obstruction, unspecified as to cause: Secondary | ICD-10-CM | POA: Diagnosis present

## 2023-09-19 DIAGNOSIS — K5730 Diverticulosis of large intestine without perforation or abscess without bleeding: Secondary | ICD-10-CM | POA: Diagnosis present

## 2023-09-19 DIAGNOSIS — Z9079 Acquired absence of other genital organ(s): Secondary | ICD-10-CM | POA: Diagnosis not present

## 2023-09-19 LAB — CBC
HCT: 33.9 % — ABNORMAL LOW (ref 39.0–52.0)
Hemoglobin: 11.4 g/dL — ABNORMAL LOW (ref 13.0–17.0)
MCH: 27 pg (ref 26.0–34.0)
MCHC: 33.6 g/dL (ref 30.0–36.0)
MCV: 80.1 fL (ref 80.0–100.0)
Platelets: 173 K/uL (ref 150–400)
RBC: 4.23 MIL/uL (ref 4.22–5.81)
RDW: 14.9 % (ref 11.5–15.5)
WBC: 4 K/uL (ref 4.0–10.5)
nRBC: 0 % (ref 0.0–0.2)

## 2023-09-19 LAB — BASIC METABOLIC PANEL WITH GFR
Anion gap: 10 (ref 5–15)
BUN: 15 mg/dL (ref 8–23)
CO2: 22 mmol/L (ref 22–32)
Calcium: 9.1 mg/dL (ref 8.9–10.3)
Chloride: 102 mmol/L (ref 98–111)
Creatinine, Ser: 1.21 mg/dL (ref 0.61–1.24)
GFR, Estimated: 60 mL/min — ABNORMAL LOW (ref >60)
Glucose, Bld: 89 mg/dL (ref 70–99)
Potassium: 4.1 mmol/L (ref 3.5–5.1)
Sodium: 134 mmol/L — ABNORMAL LOW (ref 135–145)

## 2023-09-19 LAB — TROPONIN I (HIGH SENSITIVITY): Troponin I (High Sensitivity): 13 ng/L (ref <18)

## 2023-09-19 MED ORDER — ACETAMINOPHEN 325 MG PO TABS: 650.0000 mg | ORAL_TABLET | Freq: Four times a day (QID) | ORAL | Status: DC | PRN

## 2023-09-19 NOTE — Evaluation (Signed)
 Occupational Therapy Evaluation Patient Details Name: [name] MRN: [id number] DOB: 07-08-1940 Today's Date: 09/19/2023   History of Present Illness   [name] is a 83 y.o. who presented with abdominal pain with N/V/D and admitted for partial small bowel obstruction. PMH of diverticulitis, prostate adenocarcinoma s/p XRT, HTN, HLD, and gout     Clinical Impressions [name] was evaluated s/p the above admission list. He is indep at baseline and lives with his son who could assist if needed. Upon evaluation the pt was limited by generalized weakness, abdominal pain and limited activity tolerance. Overall he needed up to [facility] for transfers and functional mobility without AD. Due to the deficits listed below the pt also needs up to [facility] with increased time for ADLs. Pt will benefit from continued acute OT services to discharge home without follow up OT.      If plan is discharge home, recommend the following:   A little help with bathing/dressing/bathroom;Assistance with cooking/housework;Help with stairs or ramp for entrance;Assist for transportation     Functional Status Assessment   Patient has had a recent decline in their functional status and demonstrates the ability to make significant improvements in function in a reasonable and predictable amount of time.     Equipment Recommendations   None recommended by OT      Precautions/Restrictions   Precautions Precautions: Fall Recall of Precautions/Restrictions: Intact Restrictions Weight Bearing Restrictions Per Provider Order: No     Mobility Bed Mobility Overal bed mobility: Needs Assistance Bed Mobility: Supine to Sit     Supine to sit: Supervision          Transfers Overall transfer level: Needs assistance Equipment used: None Transfers: Sit to/from Stand Sit to Stand: Supervision          Balance Overall balance assessment: Needs assistance Sitting-balance support: Feet  supported Sitting balance-Leahy Scale: Good     Standing balance support: No upper extremity supported Standing balance-Leahy Scale: Fair         ADL either performed or assessed with clinical judgement   ADL Overall ADL's : Needs assistance/impaired Eating/Feeding: Independent   Grooming: Supervision/safety;Standing   Upper Body Bathing: Set up;Sitting   Lower Body Bathing: Contact guard assist;Sit to/from stand   Upper Body Dressing : Set up;Sitting   Lower Body Dressing: Contact guard assist;Sit to/from stand   Toilet Transfer: Contact guard assist;Ambulation;Regular Toilet   Toileting- Clothing Manipulation and Hygiene: Supervision/safety;Sitting/lateral lean       Functional mobility during ADLs: Contact guard assist General ADL Comments: no AD, a little unsteady upon standing but no overt LOB. progressed to superivison A. increased time needed for all tasks     Vision Baseline Vision/History: 1 Wears glasses Vision Assessment?: No apparent visual deficits     Perception Perception: Not tested       Praxis Praxis: Not tested       Pertinent Vitals/Pain Pain Assessment Pain Assessment: Faces Faces Pain Scale: Hurts a little bit Pain Location: abdomen Pain Descriptors / Indicators: Discomfort     Extremity/Trunk Assessment Upper Extremity Assessment Upper Extremity Assessment: Overall WFL for tasks assessed   Lower Extremity Assessment Lower Extremity Assessment: Defer to PT evaluation   Cervical / Trunk Assessment Cervical / Trunk Assessment: Kyphotic (mild)   Communication Communication Communication: No apparent difficulties (difficult to understand at times)   Cognition Arousal: Alert Behavior During Therapy: [facility] for tasks assessed/performed Cognition: No apparent impairments  OT - Cognition Comments: WFL. pt asked am I going to have to eat like this forever? in regard to the liquid diet                 Following  commands: Intact       Cueing  General Comments   Cueing Techniques: Verbal cues  VSS on RA    Home Living Family/patient expects to be discharged to:: Private residence Living Arrangements: Children (son) Available Help at Discharge: Family;Available 24 hours/day Type of Home: House Home Access: Ramped entrance     Home Layout: One level     Bathroom Shower/Tub: Chief Strategy Officer: Standard     Home Equipment: Cane - single point          Prior Functioning/Environment Prior Level of Function : Independent/Modified Independent             Mobility Comments: no AD, no falls ADLs Comments: indep ADL/IADLs    OT Problem List: Decreased activity tolerance;Decreased range of motion;Impaired balance (sitting and/or standing)   OT Treatment/Interventions: Self-care/ADL training;Therapeutic exercise;DME and/or AE instruction;Patient/family education;Balance training      OT Goals(Current goals can be found in the care plan section)   Acute Rehab OT Goals Patient Stated Goal: to eat normal food OT Goal Formulation: With patient Time For Goal Achievement: 10/03/23 Potential to Achieve Goals: Good ADL Goals Additional ADL Goal #1: pt will complete ADLs with mod I Additional ADL Goal #2: Pt will complete 10 minutes of OOB functional activity to demonstrate improved tolerance   OT Frequency:  Min 2X/week       AM-PAC OT 6 Clicks Daily Activity     Outcome Measure Help from another person eating meals?: None Help from another person taking care of personal grooming?: A Little Help from another person toileting, which includes using toliet, bedpan, or urinal?: A Little Help from another person bathing (including washing, rinsing, drying)?: A Little Help from another person to put on and taking off regular upper body clothing?: A Little Help from another person to put on and taking off regular lower body clothing?: A Little 6 Click Score: 19    End of Session Nurse Communication: Mobility status  Activity Tolerance: Patient tolerated treatment well Patient left: in chair;with call bell/phone within reach  OT Visit Diagnosis: Unsteadiness on feet (R26.81);Other abnormalities of gait and mobility (R26.89);Muscle weakness (generalized) (M62.81)                Time: 8879-8861 OT Time Calculation (min): 18 min Charges:  OT General Charges $OT Visit: 1 Visit OT Evaluation $OT Eval Moderate Complexity: 1 Mod  Lucie Kendall, OTR/L Acute Rehabilitation Services Office [phone] Secure Chat Communication Preferred   Lucie JONETTA Kendall 09/19/2023, 12:14 PM

## 2023-09-19 NOTE — Evaluation (Signed)
 Physical Therapy Evaluation Patient Details Name: [name] MRN: [id number] DOB: 16-Apr-1940 Today's Date: 09/19/2023  History of Present Illness  Pt is an 83 y/o M admitted on 09/18/23 after presenting with c/o abdominal ain, N/V/D. Pt is being treated for partial SBO. PMH: diverticulitis, prostate adenocarcinoma s/p XRT, HTN, HLD, gout, partial SBO (Feb 2025)  Clinical Impression  Pt seen for PT evaluation with pt agreeable to tx. Pt reports prior to admission he was independent without AD, denies falls, but reports having an episode of feeling faint in the past week. On this date, pt is able to ambulate with RUE HHA to simulate [facility] he has at home. Pt without LOB during gait, increasing ambulation distance on 2nd trial. Pt with c/o dizziness but VSS stable in sitting. Recommend ongoing PT services to progress balance & gait with LRAD.        If plan is discharge home, recommend the following: A little help with walking and/or transfers;A little help with bathing/dressing/bathroom;Assistance with cooking/housework;Assist for transportation;Help with stairs or ramp for entrance   Can travel by private vehicle        Equipment Recommendations None recommended by PT  Recommendations for Other Services       Functional Status Assessment Patient has had a recent decline in their functional status and/or demonstrates limited ability to make significant improvements in function in a reasonable and predictable amount of time     Precautions / Restrictions Precautions Precautions: Fall Restrictions Weight Bearing Restrictions Per Provider Order: No      Mobility  Bed Mobility               General bed mobility comments: not tested, pt received & left sitting in recliner    Transfers Overall transfer level: Needs assistance Equipment used: None Transfers: Sit to/from Stand Sit to Stand: Supervision, Contact guard assist           General transfer comment: sit>stand  from recliner    Ambulation/Gait Ambulation/Gait assistance: Contact guard assist Gait Distance (Feet): 40 Feet (+ 250 ft) Assistive device: 1 person hand held assist Gait Pattern/deviations: Decreased step length - right, Decreased step length - left, Decreased stride length Gait velocity: decreased     General Gait Details: First ambulation bout limited by c/o dizziness. Pt ambulated with RUE HHA to simulate SPC he has at home, no LOB noted.  Stairs            Wheelchair Mobility     Tilt Bed    Modified Rankin (Stroke Patients Only)       Balance Overall balance assessment: Needs assistance Sitting-balance support: Feet supported Sitting balance-Leahy Scale: Good     Standing balance support: Single extremity supported, During functional activity Standing balance-Leahy Scale: Fair                               Pertinent Vitals/Pain Pain Assessment Pain Assessment: No/denies pain    Home Living Family/patient expects to be discharged to:: Private residence Living Arrangements: Children Available Help at Discharge: Family;Available 24 hours/day Type of Home: House Home Access: Ramped entrance       Home Layout: One level Home Equipment: Cane - single point      Prior Function Prior Level of Function : Independent/Modified Independent             Mobility Comments: no AD, no falls, had planned fishing trip with son & grandson but  unable to go 2/2 hospitalization ADLs Comments: indep ADL/IADLs     Extremity/Trunk Assessment   Upper Extremity Assessment Upper Extremity Assessment: Overall WFL for tasks assessed    Lower Extremity Assessment Lower Extremity Assessment: Generalized weakness    Cervical / Trunk Assessment Cervical / Trunk Assessment:  (rounded shoulders, forward head)  Communication   Communication Communication: Impaired Factors Affecting Communication: Reduced clarity of speech (speaks at low volume)     Cognition Arousal: Alert Behavior During Therapy: WFL for tasks assessed/performed   PT - Cognitive impairments: No apparent impairments                         Following commands: Intact       Cueing Cueing Techniques: Verbal cues     General Comments General comments (skin integrity, edema, etc.): BP sitting in recliner after first gait trial in LUE: 132/74 (91) HR 72 bpm    Exercises     Assessment/Plan    PT Assessment Patient needs continued PT services  PT Problem List Decreased strength;Decreased activity tolerance;Decreased balance;Decreased mobility;Decreased knowledge of use of DME       PT Treatment Interventions DME instruction;Gait training;Neuromuscular re-education;Balance training;Functional mobility training;Therapeutic activities;Therapeutic exercise;Patient/family education;Stair training    PT Goals (Current goals can be found in the Care Plan section)  Acute Rehab PT Goals Patient Stated Goal: get better, have a BM PT Goal Formulation: With patient Time For Goal Achievement: 10/03/23 Potential to Achieve Goals: Good    Frequency Min 2X/week     Co-evaluation               AM-PAC PT 6 Clicks Mobility  Outcome Measure Help needed turning from your back to your side while in a flat bed without using bedrails?: None Help needed moving from lying on your back to sitting on the side of a flat bed without using bedrails?: None Help needed moving to and from a bed to a chair (including a wheelchair)?: A Little Help needed standing up from a chair using your arms (e.g., wheelchair or bedside chair)?: A Little Help needed to walk in hospital room?: A Little Help needed climbing 3-5 steps with a railing? : A Little 6 Click Score: 20    End of Session   Activity Tolerance: Patient tolerated treatment well Patient left: in chair;with call bell/phone within reach   PT Visit Diagnosis: Muscle weakness (generalized) (M62.81);Unsteadiness  on feet (R26.81)    Time: 8678-8665 PT Time Calculation (min) (ACUTE ONLY): 13 min   Charges:   PT Evaluation $PT Eval Moderate Complexity: 1 Mod   PT General Charges $$ ACUTE PT VISIT: 1 Visit         Richerd Pinal, PT, DPT 09/19/23, 1:59 PM   Richerd CHRISTELLA Pinal 09/19/2023, 1:58 PM

## 2023-09-19 NOTE — TOC CM/SW Note (Signed)
 Transition of Care [facility]) - Inpatient Brief Assessment   Patient Details  Name: [name] MRN: [id number] Date of Birth: Apr 10, 1940  Transition of Care [facility]) CM/SW Contact:    Lauraine FORBES Saa, LCSWA Phone Number: 09/19/2023, 9:10 AM   Clinical Narrative:  9:10 AM Per chart review, patient resides at home with child(ren). Patient has a PCP and insurance. Patient does not have SNF history. Patient has HH history with [facility]. Patient has DME (rolling walker) history. Patient's preferred pharmacy is [facility] [phone] [facility]. No TOC needs were identified at this time. TOC will continue to follow and be available to assist.  Transition of Care Asessment: Insurance and Status: Insurance coverage has been reviewed Patient has primary care physician: Yes Home environment has been reviewed: Private Residence Prior level of function:: N/A Prior/Current Home Services: No current home services Social Drivers of Health Review: SDOH reviewed no interventions necessary Readmission risk has been reviewed: Yes (Currently Observation Status) Transition of care needs: no transition of care needs at this time

## 2023-09-19 NOTE — Progress Notes (Addendum)
 HD#0 SUBJECTIVE:  Patient Summary: [name] is a 83 y.o. with a pertinent PMH of diverticulitis, prostate adenocarcinoma s/p XRT, HTN, HLD, and gout who presented with abdominal pain with N/V/D and admitted for partial small bowel obstruction.   Overnight Events: EKG with new T-wave inversions in leads V2-V4. Patient c/o chest pain only with cough. He states that the cough/pain only started after the NG tube was placed.  Interim History: Both abdominal pain and nausea has improved since admission. He is passing gas but no bowel movements. Otherwise feeling much better today.   OBJECTIVE:  Vital Signs: Vitals:   09/19/23 0029 09/19/23 0411 09/19/23 0413 09/19/23 0800  BP: (!) 146/67 116/66  (!) 140/73  Pulse: 62 64  63  Resp: 18 17    Temp: 97.7 F (36.5 C) 97.9 F (36.6 C)  97.9 F (36.6 C)  TempSrc: Oral Axillary    SpO2: 99% 100%  100%  Weight:   74.8 kg   Height:       Supplemental O2: Room Air SpO2: 100 %  Filed Weights   09/18/23 0730 09/19/23 0413  Weight: 74.8 kg 74.8 kg     Intake/Output Summary (Last 24 hours) at 09/19/2023 1133 Last data filed at 09/19/2023 1009 Gross per 24 hour  Intake 2075.66 ml  Output 1300 ml  Net 775.66 ml   Net IO Since Admission: 1,275.66 mL [09/19/23 1133]  Physical Exam: Constitutional: well-appearing, well-nourished, lying in hospital bed in no acute distress; soft-spoken HENT: normocephalic atraumatic, mucous membranes moist Eyes: conjunctiva non-erythematous, PERRL, no scleral icterus Neck: supple without lesions, thyroid non-enlarged and non-tender Cardiovascular: regular rate and rhythm, no m/r/g Pulmonary/Chest: normal work of breathing on room air, lungs clear to auscultation bilaterally Abdominal: soft, non-tender to palpation in all four quadrants; no masses, non-distended, bowel sounds normal MSK: normal bulk and tone, cervical spine non-tender to palpation; neck extension limited due to pain, but ROM of  rotation and flexion normal. Neurological: alert & oriented x3, answers questions appropriately Skin: warm and dry Extremities: no edema or cyanosis; peripheral pulses intact; MTP joints swollen without erythema/warmth bilaterally Psych: normal mood and affect, thought content normal  Patient Lines/Drains/Airways Status     Active Line/Drains/Airways     Name Placement date Placement time Site Days   Peripheral IV 09/18/23 20 G Anterior;Distal;Right;Upper Arm 09/18/23  0706  Arm  1   NG/OG Vented/Dual Lumen 14 Fr. Right nare 55 cm 09/18/23  1514  Right nare  1            Pertinent labs and imaging:      Latest Ref Rng & Units 09/19/2023   12:16 AM 09/18/2023    7:35 AM 03/11/2023    3:31 AM  CBC  WBC 4.0 - 10.5 K/uL 4.0  5.7  5.3   Hemoglobin 13.0 - 17.0 g/dL 88.5  87.8  88.3   Hematocrit 39.0 - 52.0 % 33.9  36.5  34.6   Platelets 150 - 400 K/uL 173  183  241        Latest Ref Rng & Units 09/19/2023   12:16 AM 09/18/2023    7:35 AM 03/11/2023    3:31 AM  CMP  Glucose 70 - 99 mg/dL 89  97  86   BUN 8 - 23 mg/dL 15  15  14    Creatinine 0.61 - 1.24 mg/dL 8.78  8.55  9.03   Sodium 135 - 145 mmol/L 134  134  136   Potassium  3.5 - 5.1 mmol/L 4.1  3.9  3.5   Chloride 98 - 111 mmol/L 102  100  104   CO2 22 - 32 mmol/L 22  20  23    Calcium 8.9 - 10.3 mg/dL 9.1  9.1  8.8   Total Protein 6.5 - 8.1 g/dL  7.0  6.8   Total Bilirubin 0.0 - 1.2 mg/dL  1.2  0.6   Alkaline Phos 38 - 126 U/L  50  49   AST 15 - 41 U/L  39  26   ALT 0 - 44 U/L  19  12     DG Abd Portable 1V-Small Bowel Obstruction Protocol-initial, 8 hr delay Result Date: 09/19/2023 CLINICAL DATA:  8 hour delayed small-bowel series. EXAM: PORTABLE ABDOMEN - 1 VIEW COMPARISON:  Flat plate study yesterday 3:25 p.m., CT abdomen and pelvis yesterday at 8:36 a.m. FINDINGS: 2:11 a.m. NGT extends to the left in the stomach with the tip in the proximal fundal area. There is slight dilatation of central abdominal small bowel  segments up to 3 cm. There is contrast in the normal caliber terminal ileum, appendix, and scattered throughout the colon. No other small bowel contrast is seen. Maximal small bowel distension on the prior study was only slightly greater at 3.3 cm. Colonic gas is seen through to the rectum within the normal caliber large bowel. There is diffuse diverticulosis of the left colon. IMPRESSION: 1. Slight dilatation of central abdominal small bowel segments up to 3 cm. Slightly improved. 2. Contrast in the normal caliber terminal ileum, appendix, and scattered throughout the colon. No other small bowel contrast is seen. 3. Diverticulosis. Electronically Signed   By: Francis Quam M.D.   On: 09/19/2023 02:34   DG Abd Portable 1V-Small Bowel Protocol-Position Verification Result Date: 09/18/2023 CLINICAL DATA:  Nasogastric tube placement. EXAM: PORTABLE ABDOMEN - 1 VIEW COMPARISON:  Abdomen and pelvis CT dated 09/18/2023 FINDINGS: Interval nasogastric tube with its tip and side hole in the proximal stomach. Normal bowel-gas pattern. Lumbar and lower thoracic spine degenerative changes. IMPRESSION: Nasogastric tube tip and side hole in the proximal stomach. Electronically Signed   By: Elspeth Bathe M.D.   On: 09/18/2023 15:37    ASSESSMENT/PLAN:  Assessment: Principal Problem:   Partial bowel obstruction (HCC) Active Problems:   AKI (acute kidney injury) (HCC)   SBO (small bowel obstruction) (HCC)  #Partial Small Bowel Obstruction Patient's abdominal pain and nausea which is improved today and he is now passing gas, but no BMs reported. NG with good output of 650cc. Today abdominal exam remains benign. No tenderness to palpation. Soft, nondistended, no masses palpated. CT abdomen/pelvis showing mild dilation of mid-ileum which may represent a partial small bowel obstruction. He has a history of prior partial small bowel obstruction including admission in Feb 2025. Given his history of radiation for prostate  adenocarcinoma, this could be a cause of mechanical obstruction as he lacks any surgical history. ED provider consulted general surgery who deemed that no acute surgical interventions were needed. They recommended bowel rest. I don't think he is having any acute infectious process and will likely improve with conservative management. -General Surgery following, recs:            -No indication for urgent surgical intervention -Remove NGT and  -CLD -Replete electrolytes (K >=4, Phos >= 3, Mg >= 2)  -s/p 1L LR bolus -maintenance fluids: LR at 126ml/hr\ -Zofran  prn for nausea  #T-wave inversions on EKG Initial EKG with QTc of 559,  however there was significant artifact present. Repeat showed normal QTc however there were new T-wave inversions present in leads V2-V4. Patient c/o CP only with cough which is a chronic finding for him. Troponins were flat 14->13, low suspicion for ACS as pattern of T-wave inversions do not correspond with coronary arterial supply. Will continue to monitor chest pain.  #AKI, improving Scr of 1.44 on admission with a baseline of ~1. This has now improved to 1.21 with fluids. Likely pre-renal in the setting of decreased PO intake and volume loss. He received a fluid bolus in the ED and is on maintenance fluids. We will trend BMP for improvement. -avoid nephrotoxic medications -trend BMPs  #Prostatic adenocarcinoma s/p seed implant and XRT #History of BPH s/p TURP #History of cystitis Patient noted increased urinary frequency. He has a prior history of BPH s/p TURP as well as prostate adenocarcinoma s/p gold seed implants on 06/13/2021 and radiation therapy from 09/05/21-09/10/21. 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 UA today with moderate blood but minimal RBCs seen. No concern for acute infection at this time but will reassess. -continue to monitor  #Bilateral  inguinal hernias seen on imaging #Diverticulosis with history of diverticulitis Bilateral inguinal hernias and diverticulosis seen on imaging but his symptomology is not consistent with this as a cause for his abdominal pain. He does not have a history of hernia incarceration and based on current physical exam I have extremely low suspicion for this. Presentation is also not consistent with diverticulitis. He has been afebrile and with normal WBC. Will continue to monitor and reassess for changes in clinical condition. -continue to monitor  #Neck pain/stiffness The patient does have some neck pain with neck extension. Neck muscles were tight until properly placed pillow. No pain with palpation. Range of motion is normal with rotation. Likely muscular in nature. After placing a pillow under his head he was able to relax his neck with some relief of his soreness. Added prn lidocaine  patches and will consider IV Ofirmev  while he is NPO for bowel rest. -Lidocaine  patch prn   Best Practice: Diet: Clear liquid diet IVF: Fluids: none VTE: enoxaparin  (LOVENOX ) injection 40 mg Start: 09/18/23 1515 Code: Full  Disposition planning: Therapy Recs: Pending, DME: none Family Contact: Vinie Gin, at bedside. DISPO: Anticipated discharge in 1-2 days to Home pending clinical improvement.  Signature:  Letha Cheadle, MD [facility]  11:33 AM, 09/19/2023  On Call pager [phone]

## 2023-09-19 NOTE — Plan of Care (Signed)

## 2023-09-19 NOTE — Progress Notes (Signed)
 Subjective: CC: Reports abdominal pain resolved. No nausea. NGT with 650cc out since placement. Passing flatus. No BM.   Objective: Vital signs in last 24 hours: Temp:  [97.7 F (36.5 C)-99.3 F (37.4 C)] 97.9 F (36.6 C) (08/22 0800) Pulse Rate:  [62-96] 63 (08/22 0800) Resp:  [16-18] 17 (08/22 0411) BP: (99-146)/(57-79) 140/73 (08/22 0800) SpO2:  [94 %-100 %] 100 % (08/22 0800) Weight:  [74.8 kg] 74.8 kg (08/22 0413) Last BM Date : 09/17/23  Intake/Output from previous day: 08/21 0701 - 08/22 0700 In: 1977.4 [I.V.:1763.7; IV Piggyback:213.8] Out: 1300 [Urine:650; Emesis/NG output:650] Intake/Output this shift: No intake/output data recorded.  PE: Gen:  Alert, NAD, pleasant Abd: Soft, possible mild distension, NT, NGT in place w/ bilious output.   Lab Results:  Recent Labs    09/18/23 0735 09/19/23 0016  WBC 5.7 4.0  HGB 12.1* 11.4*  HCT 36.5* 33.9*  PLT 183 173   BMET Recent Labs    09/18/23 0735 09/19/23 0016  NA 134* 134*  K 3.9 4.1  CL 100 102  CO2 20* 22  GLUCOSE 97 89  BUN 15 15  CREATININE 1.44* 1.21  CALCIUM 9.1 9.1   PT/INR No results for input(s): LABPROT, INR in the last 72 hours. CMP     Component Value Date/Time   NA 134 (L) 09/19/2023 0016   NA 136 07/26/2021 1459   K 4.1 09/19/2023 0016   CL 102 09/19/2023 0016   CO2 22 09/19/2023 0016   GLUCOSE 89 09/19/2023 0016   BUN 15 09/19/2023 0016   BUN 15 07/26/2021 1459   CREATININE 1.21 09/19/2023 0016   CREATININE 1.05 03/15/2014 1546   CALCIUM 9.1 09/19/2023 0016   PROT 7.0 09/18/2023 0735   PROT 7.0 07/26/2021 1459   ALBUMIN 3.6 09/18/2023 0735   ALBUMIN 4.0 07/26/2021 1459   AST 39 09/18/2023 0735   ALT 19 09/18/2023 0735   ALKPHOS 50 09/18/2023 0735   BILITOT 1.2 09/18/2023 0735   BILITOT 0.2 07/26/2021 1459   GFRNONAA 60 (L) 09/19/2023 0016   GFRNONAA 52 (L) 02/03/2014 1111   GFRAA 64 04/15/2019 1002   GFRAA 60 02/03/2014 1111   Lipase     Component  Value Date/Time   LIPASE 35 09/18/2023 0735    Studies/Results: DG Abd Portable 1V-Small Bowel Obstruction Protocol-initial, 8 hr delay Result Date: 09/19/2023 CLINICAL DATA:  8 hour delayed small-bowel series. EXAM: PORTABLE ABDOMEN - 1 VIEW COMPARISON:  Flat plate study yesterday 3:25 p.m., CT abdomen and pelvis yesterday at 8:36 a.m. FINDINGS: 2:11 a.m. NGT extends to the left in the stomach with the tip in the proximal fundal area. There is slight dilatation of central abdominal small bowel segments up to 3 cm. There is contrast in the normal caliber terminal ileum, appendix, and scattered throughout the colon. No other small bowel contrast is seen. Maximal small bowel distension on the prior study was only slightly greater at 3.3 cm. Colonic gas is seen through to the rectum within the normal caliber large bowel. There is diffuse diverticulosis of the left colon. IMPRESSION: 1. Slight dilatation of central abdominal small bowel segments up to 3 cm. Slightly improved. 2. Contrast in the normal caliber terminal ileum, appendix, and scattered throughout the colon. No other small bowel contrast is seen. 3. Diverticulosis. Electronically Signed   By: Francis Quam M.D.   On: 09/19/2023 02:34   DG Abd Portable 1V-Small Bowel Protocol-Position Verification Result Date: 09/18/2023 CLINICAL DATA:  Nasogastric tube placement. EXAM: PORTABLE ABDOMEN - 1 VIEW COMPARISON:  Abdomen and pelvis CT dated 09/18/2023 FINDINGS: Interval nasogastric tube with its tip and side hole in the proximal stomach. Normal bowel-gas pattern. Lumbar and lower thoracic spine degenerative changes. IMPRESSION: Nasogastric tube tip and side hole in the proximal stomach. Electronically Signed   By: Elspeth Bathe M.D.   On: 09/18/2023 15:37   CT ABDOMEN PELVIS W CONTRAST Result Date: 09/18/2023 CLINICAL DATA:  Abdominal pain, acute, nonlocalized. Nausea, vomiting and diarrhea. EXAM: CT ABDOMEN AND PELVIS WITH CONTRAST TECHNIQUE:  Multidetector CT imaging of the abdomen and pelvis was performed using the standard protocol following bolus administration of intravenous contrast. RADIATION DOSE REDUCTION: This exam was performed according to the departmental dose-optimization program which includes automated exposure control, adjustment of the mA and/or kV according to patient size and/or use of iterative reconstruction technique. CONTRAST:  75mL OMNIPAQUE  IOHEXOL  350 MG/ML SOLN COMPARISON:  03/10/2023 FINDINGS: Lower chest: Lung bases are clear.  No pleural or pericardial fluid. Hepatobiliary: Liver parenchyma is unremarkable. Gallbladder is full but does not contain any calcified stones or show gross wall thickening. Pancreas: Normal Spleen: Normal Adrenals/Urinary Tract: Adrenal glands are normal. Bilateral renal cysts. No evidence of stone or obstruction. No imaging follow-up recommended for these cysts. Stomach/Bowel: Proximal small bowel appears normal. Mildly dilated fluid-filled mid ileum which could go along with a partial small bowel obstruction. No high-grade obstruction. Colon does not show any acute finding. Diverticulosis without visible diverticulitis. Some liquid stool. Bilateral inguinal hernia is that do not contain bowel. Vascular/Lymphatic: Aortic atherosclerosis. No aneurysm. IVC is normal. No adenopathy. Reproductive: Metallic markers in the prostate bed. Other: No free fluid or air. Musculoskeletal: Chronic lumbar degenerative changes without acute finding. Chronic osteoarthritis of the hips. IMPRESSION: 1. Mildly dilated fluid-filled mid ileum which could go along with a partial small bowel obstruction. No high-grade obstruction. 2. Diverticulosis without visible diverticulitis. 3. Bilateral inguinal hernias that do not contain bowel. 4. Aortic atherosclerosis. Aortic Atherosclerosis (ICD10-I70.0). Electronically Signed   By: Oneil Officer M.D.   On: 09/18/2023 08:52    Anti-infectives: Anti-infectives (From admission,  onward)    None        Assessment/Plan PSBO vs enteritis  - CT 8/21 with mildly dilated fluid-filled mid ileum possible PSBO, bilateral inguinal hernias not containing bowel. No free fluid or air.  - No prior abdominal surgery. He has had radiation to his pelvis for prostate cancer - Afebrile. No tachycardia or hypotension. WBC wnl. No peritonitis on exam.  - No indication for emergency surgery - Xray with contrast in colon, pain resolved, he is now passing flatus and had a benign exam. Will d/c NGT and advance to CLD.  - Keep K >=4, Phos >= 3, Mg >= 2 and mobilize for bowel function - We will follow with you.    FEN: CLD, IVF per [facility] VTE: SCDs, Lovenox  ID: None currently   - per primary -  HTN HLD OA Gout Hx of diverticulitis BPH prostate cancer s/p radiation radiation hematuria/cystitis hx of DVT/PE not currently on anticoagulation - has been off for years per pt report T wave changes on EKG  I reviewed nursing notes, hospitalist notes, last 24 h vitals and pain scores, last 48 h intake and output, last 24 h labs and trends, and last 24 h imaging results.    LOS: 0 days    [name] Shaper, [facility] Surgery 09/19/2023, 8:37 AM Please see Amion for pager number during  day hours 7:00am-4:30pm

## 2023-09-19 NOTE — Care Management Obs Status (Signed)
 MEDICARE OBSERVATION STATUS NOTIFICATION   Patient Details  Name: [name] MRN: [id number] Date of Birth: 09-Mar-1940   Medicare Observation Status Notification Given:  Yes  Obs letter signed and copy given   Claretta Deed 09/19/2023, 1:05 PM

## 2023-09-20 DIAGNOSIS — K56600 Partial intestinal obstruction, unspecified as to cause: Secondary | ICD-10-CM | POA: Diagnosis not present

## 2023-09-20 LAB — CBC
HCT: 31.7 % — ABNORMAL LOW (ref 39.0–52.0)
Hemoglobin: 10.7 g/dL — ABNORMAL LOW (ref 13.0–17.0)
MCH: 26.8 pg (ref 26.0–34.0)
MCHC: 33.8 g/dL (ref 30.0–36.0)
MCV: 79.4 fL — ABNORMAL LOW (ref 80.0–100.0)
Platelets: 197 K/uL (ref 150–400)
RBC: 3.99 MIL/uL — ABNORMAL LOW (ref 4.22–5.81)
RDW: 14.7 % (ref 11.5–15.5)
WBC: 3.2 K/uL — ABNORMAL LOW (ref 4.0–10.5)
nRBC: 0 % (ref 0.0–0.2)

## 2023-09-20 LAB — RENAL FUNCTION PANEL
Albumin: 3.2 g/dL — ABNORMAL LOW (ref 3.5–5.0)
Anion gap: 6 (ref 5–15)
BUN: 13 mg/dL (ref 8–23)
CO2: 25 mmol/L (ref 22–32)
Calcium: 8.7 mg/dL — ABNORMAL LOW (ref 8.9–10.3)
Chloride: 102 mmol/L (ref 98–111)
Creatinine, Ser: 1.05 mg/dL (ref 0.61–1.24)
GFR, Estimated: >60 mL/min (ref >60)
Glucose, Bld: 81 mg/dL (ref 70–99)
Phosphorus: 2.8 mg/dL (ref 2.5–4.6)
Potassium: 3.8 mmol/L (ref 3.5–5.1)
Sodium: 133 mmol/L — ABNORMAL LOW (ref 135–145)

## 2023-09-20 LAB — MAGNESIUM: Magnesium: 1.9 mg/dL (ref 1.7–2.4)

## 2023-09-20 NOTE — Discharge Summary (Signed)
 Name: [name] MRN: [id number] DOB: [birth year] 83 y.o. PCP: [name] Mliss Dragon, MD  Date of Admission: 09/18/2023  7:25 AM Date of Discharge: 09/20/2023  Attending Physician: Dr. Dayton [name]  Discharge Diagnosis: Principal Problem:   Partial bowel obstruction [facility]) Active Problems:   AKI (acute kidney injury) (HCC)   SBO (small bowel obstruction) (HCC)    Discharge Medications: Allergies as of 09/20/2023   No Known Allergies      Medication List    You have not been prescribed any medications.     Disposition and follow-up:   [name] was discharged from [facility] in Good condition.  At the hospital follow up visit please address:  1.  Follow-up:  a.  No return of abdominal pain or nausea and adequate p.o. intake.  If any concerns for low p.o. intake would recommend repeat BMP.     Follow-up Appointments:  Follow-up Information     [name] Mliss Dragon, MD. Go on 09/23/2023.   Specialty: Internal Medicine Why: at 10:15 AM, please arrive 15-30 minutes early. Contact information: [address] [place] [address] [phone]                 Hospital Course by problem list: [name] is a 83 y.o. with a pertinent PMH of diverticulitis, prostate adenocarcinoma s/p XRT, HTN, HLD, and gout who presented with abdominal pain with N/V/D and admitted for partial small bowel obstruction.   #Partial Small Bowel Obstruction Patient initially presented with abdominal pain and nausea on admission rated 8/10. He has a prior admission for partial SBO in February 2025 and had CT abdomen/pelvis findings c/f another partial small bowel obstruction. Exam benign with no masses palpated, soft, non-distended, non-tender. General surgery was consulted who recommended bowel rest and did not think the patient required any surgical interventions. He was placed on NGT with suction and had good output after 1 day. He was  subsequently transitioned to CLD with resolution of his symptoms. Pain was initially controlled with IV acetaminophen  and transitioned to PO after he was taken off NPO. Likely etiology of his partial SBO may be related to his     which is improved today and he is now passing gas, but no BMs reported. NG with good output of 650cc. Today abdominal exam remains benign. No tenderness to palpation. Soft, nondistended, no masses palpated. CT abdomen/pelvis showing mild dilation of mid-ileum which may represent a partial small bowel obstruction. He has a history of prior partial small bowel obstruction including admission in Feb 2025. Given his history of radiation for prostate adenocarcinoma, this could be a cause of mechanical obstruction as he lacks any surgical history. ED provider consulted general surgery who deemed that no acute surgical interventions were needed. They recommended bowel rest. I don't think he is having any acute infectious process and will likely improve with conservative management. -General Surgery following, recs:            -No indication for urgent surgical intervention -Remove NGT and  -CLD -Replete electrolytes (K >=4, Phos >= 3, Mg >= 2)  -s/p 1L LR bolus -maintenance fluids: LR at 173ml/hr -Zofran  prn for nausea   #T-wave inversions on EKG Initial EKG with QTc of 559, however there was significant artifact present. Repeat showed normal QTc however there were new T-wave inversions present in leads V2-V4. Patient c/o CP only with cough which is a chronic finding for him. Troponins were flat 14->13,  low suspicion for ACS as pattern of T-wave inversions do not correspond with coronary arterial supply. Will continue to monitor chest pain.   #AKI, improving Scr of 1.44 on admission with a baseline of ~1. This has now improved to 1.21 with fluids. Likely pre-renal in the setting of decreased PO intake and volume loss. He received a fluid bolus in the ED and is on maintenance  fluids. We will trend BMP for improvement. -avoid nephrotoxic medications -trend BMPs   #Prostatic adenocarcinoma s/p seed implant and XRT #History of BPH s/p TURP #History of cystitis Patient noted increased urinary frequency. He has a prior history of BPH s/p TURP as well as prostate adenocarcinoma s/p gold seed implants on 06/13/2021 and radiation therapy from 09/05/21-09/10/21. 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 UA today with moderate blood but minimal RBCs seen. No concern for acute infection at this time but will reassess. -continue to monitor   #Bilateral inguinal hernias seen on imaging #Diverticulosis with history of diverticulitis Bilateral inguinal hernias and diverticulosis seen on imaging but his symptomology is not consistent with this as a cause for his abdominal pain. He does not have a history of hernia incarceration and based on current physical exam I have extremely low suspicion for this. Presentation is also not consistent with diverticulitis. He has been afebrile and with normal WBC. Will continue to monitor and reassess for changes in clinical condition. -continue to monitor   #Neck pain/stiffness The patient does have some neck pain with neck extension. Neck muscles were tight until properly placed pillow. No pain with palpation. Range of motion is normal with rotation. Likely muscular in nature. After placing a pillow under his head he was able to relax his neck with some relief of his soreness. Added prn lidocaine  patches and will consider IV Ofirmev  while he is NPO for bowel rest. -Lidocaine  patch prn    Discharge Subjective:   Discharge Exam:   BP 137/66   Pulse 65   Temp 98.1 F (36.7 C) (Oral)   Resp 17   Ht 5' 11 (1.803 m)   Wt 74.7 kg   SpO2 95%   BMI 22.97 kg/m  Constitutional: Well-appearing elderly male laying in bed. In no acute  distress. Pulm:Normal work of breathing on room air. Abdomen: Soft, non-tender, non-distended, positive bowel sounds. FDX:Wzhjupcz for extremity edema. Skin:Warm and dry. Neuro:Alert and oriented x3. No focal deficit noted.  Pertinent Labs, Studies, and Procedures:     Latest Ref Rng & Units 09/20/2023    6:01 AM 09/19/2023   12:16 AM 09/18/2023    7:35 AM  CBC  WBC 4.0 - 10.5 K/uL 3.2  4.0  5.7   Hemoglobin 13.0 - 17.0 g/dL 89.2  88.5  87.8   Hematocrit 39.0 - 52.0 % 31.7  33.9  36.5   Platelets 150 - 400 K/uL 197  173  183        Latest Ref Rng & Units 09/20/2023    6:01 AM 09/19/2023   12:16 AM 09/18/2023    7:35 AM  CMP  Glucose 70 - 99 mg/dL 81  89  97   BUN 8 - 23 mg/dL 13  15  15    Creatinine 0.61 - 1.24 mg/dL 8.94  8.78  8.55   Sodium 135 - 145 mmol/L 133  134  134   Potassium 3.5 - 5.1 mmol/L 3.8  4.1  3.9   Chloride 98 -  111 mmol/L 102  102  100   CO2 22 - 32 mmol/L 25  22  20    Calcium 8.9 - 10.3 mg/dL 8.7  9.1  9.1   Total Protein 6.5 - 8.1 g/dL   7.0   Total Bilirubin 0.0 - 1.2 mg/dL   1.2   Alkaline Phos 38 - 126 U/L   50   AST 15 - 41 U/L   39   ALT 0 - 44 U/L   19     DG Abd Portable 1V-Small Bowel Obstruction Protocol-initial, 8 hr delay Result Date: 09/19/2023 CLINICAL DATA:  8 hour delayed small-bowel series. EXAM: PORTABLE ABDOMEN - 1 VIEW COMPARISON:  Flat plate study yesterday 3:25 p.m., CT abdomen and pelvis yesterday at 8:36 a.m. FINDINGS: 2:11 a.m. NGT extends to the left in the stomach with the tip in the proximal fundal area. There is slight dilatation of central abdominal small bowel segments up to 3 cm. There is contrast in the normal caliber terminal ileum, appendix, and scattered throughout the colon. No other small bowel contrast is seen. Maximal small bowel distension on the prior study was only slightly greater at 3.3 cm. Colonic gas is seen through to the rectum within the normal caliber large bowel. There is diffuse diverticulosis of the left  colon. IMPRESSION: 1. Slight dilatation of central abdominal small bowel segments up to 3 cm. Slightly improved. 2. Contrast in the normal caliber terminal ileum, appendix, and scattered throughout the colon. No other small bowel contrast is seen. 3. Diverticulosis. Electronically Signed   By: Francis Quam M.D.   On: 09/19/2023 02:34   DG Abd Portable 1V-Small Bowel Protocol-Position Verification Result Date: 09/18/2023 CLINICAL DATA:  Nasogastric tube placement. EXAM: PORTABLE ABDOMEN - 1 VIEW COMPARISON:  Abdomen and pelvis CT dated 09/18/2023 FINDINGS: Interval nasogastric tube with its tip and side hole in the proximal stomach. Normal bowel-gas pattern. Lumbar and lower thoracic spine degenerative changes. IMPRESSION: Nasogastric tube tip and side hole in the proximal stomach. Electronically Signed   By: Elspeth Bathe M.D.   On: 09/18/2023 15:37   CT ABDOMEN PELVIS W CONTRAST Result Date: 09/18/2023 CLINICAL DATA:  Abdominal pain, acute, nonlocalized. Nausea, vomiting and diarrhea. EXAM: CT ABDOMEN AND PELVIS WITH CONTRAST TECHNIQUE: Multidetector CT imaging of the abdomen and pelvis was performed using the standard protocol following bolus administration of intravenous contrast. RADIATION DOSE REDUCTION: This exam was performed according to the departmental dose-optimization program which includes automated exposure control, adjustment of the mA and/or kV according to patient size and/or use of iterative reconstruction technique. CONTRAST:  75mL OMNIPAQUE  IOHEXOL  350 MG/ML SOLN COMPARISON:  03/10/2023 FINDINGS: Lower chest: Lung bases are clear.  No pleural or pericardial fluid. Hepatobiliary: Liver parenchyma is unremarkable. Gallbladder is full but does not contain any calcified stones or show gross wall thickening. Pancreas: Normal Spleen: Normal Adrenals/Urinary Tract: Adrenal glands are normal. Bilateral renal cysts. No evidence of stone or obstruction. No imaging follow-up recommended for these  cysts. Stomach/Bowel: Proximal small bowel appears normal. Mildly dilated fluid-filled mid ileum which could go along with a partial small bowel obstruction. No high-grade obstruction. Colon does not show any acute finding. Diverticulosis without visible diverticulitis. Some liquid stool. Bilateral inguinal hernia is that do not contain bowel. Vascular/Lymphatic: Aortic atherosclerosis. No aneurysm. IVC is normal. No adenopathy. Reproductive: Metallic markers in the prostate bed. Other: No free fluid or air. Musculoskeletal: Chronic lumbar degenerative changes without acute finding. Chronic osteoarthritis of the hips. IMPRESSION: 1. Mildly dilated fluid-filled  mid ileum which could go along with a partial small bowel obstruction. No high-grade obstruction. 2. Diverticulosis without visible diverticulitis. 3. Bilateral inguinal hernias that do not contain bowel. 4. Aortic atherosclerosis. Aortic Atherosclerosis (ICD10-I70.0). Electronically Signed   By: Oneil Officer M.D.   On: 09/18/2023 08:52     Discharge Instructions:   Discharge Instructions      Calyn, Rubi were recently admitted to [facility] for a partial blockage in your intestines that resolved after some bowel rest. Please watch for any return of you pain or any nausea with eating. If these return please call you PCP or return to the hospital.   Please follow up with Dr. Trudy on 09/23/2023 at 10:15 AM.  Sincerely, Fairy Pool, DO    Signed: Fairy Pool, DO Internal Medicine Resident, PGY-3 Please contact the on call pager at [phone] for any urgent or emergent needs. 11:25 AM 09/20/2023

## 2023-09-20 NOTE — Plan of Care (Signed)

## 2023-09-20 NOTE — Progress Notes (Signed)
 Mobility Specialist Progress Note:    09/20/23 1022  Mobility  Activity Ambulated with assistance (In hallway)  Level of Assistance Contact guard assist, steadying assist  Assistive Device Other (Comment) (HHA)  Distance Ambulated (ft) 40 ft (+ 250)  Activity Response Tolerated well  Mobility Referral Yes  Mobility visit 1 Mobility  Mobility Specialist Start Time (ACUTE ONLY) [id number]  Mobility Specialist Stop Time (ACUTE ONLY) 0902  Mobility Specialist Time Calculation (min) (ACUTE ONLY) 10 min   Received pt in bed and agreeable to mobility. No physical assistance needed. No c/o. Returned to room and left pt in chair. Personal belongings and call light within reach. All needs met.  Lavanda Pollack Mobility Specialist  Please contact via [employer] or  Rehab Office [phone]

## 2023-09-20 NOTE — Progress Notes (Addendum)
 Subjective: CC: Tolerating diet. No abdominal pain, n/v. BM yesterday.   Objective: Vital signs in last 24 hours: Temp:  [97.7 F (36.5 C)-98.1 F (36.7 C)] 98.1 F (36.7 C) (08/23 0736) Pulse Rate:  [57-76] 65 (08/23 0736) Resp:  [17-18] 17 (08/23 0426) BP: (102-139)/(62-70) 137/66 (08/23 0736) SpO2:  [95 %-100 %] 95 % (08/23 0736) Weight:  [74.7 kg] 74.7 kg (08/23 0455) Last BM Date : 09/19/23  Intake/Output from previous day: 08/22 0701 - 08/23 0700 In: 818.2 [P.O.:120; I.V.:598.2; IV Piggyback:100] Out: 1100 [Urine:1100] Intake/Output this shift: No intake/output data recorded.  PE: Gen:  Alert, NAD, pleasant Abd: Soft, ND, NT  Lab Results:  Recent Labs    09/19/23 0016 09/20/23 0601  WBC 4.0 3.2*  HGB 11.4* 10.7*  HCT 33.9* 31.7*  PLT 173 197   BMET Recent Labs    09/19/23 0016 09/20/23 0601  NA 134* 133*  K 4.1 3.8  CL 102 102  CO2 22 25  GLUCOSE 89 81  BUN 15 13  CREATININE 1.21 1.05  CALCIUM 9.1 8.7*   PT/INR No results for input(s): LABPROT, INR in the last 72 hours. CMP     Component Value Date/Time   NA 133 (L) 09/20/2023 0601   NA 136 07/26/2021 1459   K 3.8 09/20/2023 0601   CL 102 09/20/2023 0601   CO2 25 09/20/2023 0601   GLUCOSE 81 09/20/2023 0601   BUN 13 09/20/2023 0601   BUN 15 07/26/2021 1459   CREATININE 1.05 09/20/2023 0601   CREATININE 1.05 03/15/2014 1546   CALCIUM 8.7 (L) 09/20/2023 0601   PROT 7.0 09/18/2023 0735   PROT 7.0 07/26/2021 1459   ALBUMIN 3.2 (L) 09/20/2023 0601   ALBUMIN 4.0 07/26/2021 1459   AST 39 09/18/2023 0735   ALT 19 09/18/2023 0735   ALKPHOS 50 09/18/2023 0735   BILITOT 1.2 09/18/2023 0735   BILITOT 0.2 07/26/2021 1459   GFRNONAA >60 09/20/2023 0601   GFRNONAA 52 (L) 02/03/2014 1111   GFRAA 64 04/15/2019 1002   GFRAA 60 02/03/2014 1111   Lipase     Component Value Date/Time   LIPASE 35 09/18/2023 0735    Studies/Results: DG Abd Portable 1V-Small Bowel Obstruction  Protocol-initial, 8 hr delay Result Date: 09/19/2023 CLINICAL DATA:  8 hour delayed small-bowel series. EXAM: PORTABLE ABDOMEN - 1 VIEW COMPARISON:  Flat plate study yesterday 3:25 p.m., CT abdomen and pelvis yesterday at 8:36 a.m. FINDINGS: 2:11 a.m. NGT extends to the left in the stomach with the tip in the proximal fundal area. There is slight dilatation of central abdominal small bowel segments up to 3 cm. There is contrast in the normal caliber terminal ileum, appendix, and scattered throughout the colon. No other small bowel contrast is seen. Maximal small bowel distension on the prior study was only slightly greater at 3.3 cm. Colonic gas is seen through to the rectum within the normal caliber large bowel. There is diffuse diverticulosis of the left colon. IMPRESSION: 1. Slight dilatation of central abdominal small bowel segments up to 3 cm. Slightly improved. 2. Contrast in the normal caliber terminal ileum, appendix, and scattered throughout the colon. No other small bowel contrast is seen. 3. Diverticulosis. Electronically Signed   By: Francis Quam M.D.   On: 09/19/2023 02:34   DG Abd Portable 1V-Small Bowel Protocol-Position Verification Result Date: 09/18/2023 CLINICAL DATA:  Nasogastric tube placement. EXAM: PORTABLE ABDOMEN - 1 VIEW COMPARISON:  Abdomen and pelvis CT dated 09/18/2023  FINDINGS: Interval nasogastric tube with its tip and side hole in the proximal stomach. Normal bowel-gas pattern. Lumbar and lower thoracic spine degenerative changes. IMPRESSION: Nasogastric tube tip and side hole in the proximal stomach. Electronically Signed   By: Elspeth Bathe M.D.   On: 09/18/2023 15:37    Anti-infectives: Anti-infectives (From admission, onward)    None        Assessment/Plan PSBO vs enteritis  - CT 8/21 with mildly dilated fluid-filled mid ileum possible PSBO, bilateral inguinal hernias not containing bowel. No free fluid or air.  - No prior abdominal surgery. He has had  radiation to his pelvis for prostate cancer - No indication for emergency surgery - Clinically and radiographically resolving. Contrast in colon on delayed film. He reports resolution of symptoms. He is tolerating a diet without abdominal pain, n/v and has return of bowel function. He has a benign exam as above. Okay for discharge from our standpoint. We will sign off. Please call back with questions or concerns.   FEN: Reg, IVF per [facility] VTE: SCDs, Lovenox  ID: None currently   - per primary -  HTN HLD OA Gout Hx of diverticulitis BPH prostate cancer s/p radiation radiation hematuria/cystitis hx of DVT/PE not currently on anticoagulation - has been off for years per pt report T wave changes on EKG  I reviewed nursing notes, hospitalist notes, last 24 h vitals and pain scores, last 48 h intake and output, last 24 h labs and trends, and last 24 h imaging results.    LOS: 1 day    [name], [facility] 09/20/2023, 9:52 AM Please see Amion for pager number during day hours 7:00am-4:30pm

## 2023-09-20 NOTE — Discharge Instructions (Addendum)
 [name],  You were recently admitted to [facility] for a partial blockage in your intestines that resolved after some bowel rest. Please watch for any return of you pain or any nausea with eating. If these return please call you PCP or return to the hospital.   Please follow up with Dr. Trudy on 09/23/2023 at 10:15 AM.  Sincerely, Fairy Pool, DO

## 2023-09-22 ENCOUNTER — Telehealth: Payer: Self-pay | Admitting: *Deleted

## 2023-09-22 NOTE — Transitions of Care (Post Inpatient/ED Visit) (Signed)
   09/22/2023  Name: [name] MRN: [id number] DOB: 1940/12/03  Today's TOC FU Call Status: Today's TOC FU Call Status:: Unsuccessful Call (1st Attempt) Unsuccessful Call (1st Attempt) Date: 09/22/23  Attempted to reach the patient regarding the most recent Inpatient/ED visit.  Follow Up Plan: Additional outreach attempts will be made to reach the patient to complete the Transitions of Care (Post Inpatient/ED visit) call.   Cathlean Headland BSN RN [facility] Health Care Management Coordinator [email or web address] Direct Dial: [phone]  Fax: [phone] Website: [email or web address]

## 2023-09-23 ENCOUNTER — Ambulatory Visit (INDEPENDENT_AMBULATORY_CARE_PROVIDER_SITE_OTHER): Admitting: Internal Medicine

## 2023-09-23 ENCOUNTER — Telehealth: Payer: Self-pay | Admitting: *Deleted

## 2023-09-23 VITALS — BP 113/64 | HR 76 | Temp 97.5°F | Ht 71.0 in | Wt 167.0 lb

## 2023-09-23 DIAGNOSIS — Z8719 Personal history of other diseases of the digestive system: Secondary | ICD-10-CM | POA: Diagnosis not present

## 2023-09-23 DIAGNOSIS — K56600 Partial intestinal obstruction, unspecified as to cause: Secondary | ICD-10-CM

## 2023-09-23 DIAGNOSIS — I951 Orthostatic hypotension: Secondary | ICD-10-CM | POA: Diagnosis present

## 2023-09-23 DIAGNOSIS — N179 Acute kidney failure, unspecified: Secondary | ICD-10-CM

## 2023-09-23 DIAGNOSIS — N3041 Irradiation cystitis with hematuria: Secondary | ICD-10-CM

## 2023-09-23 DIAGNOSIS — Z87448 Personal history of other diseases of urinary system: Secondary | ICD-10-CM

## 2023-09-23 DIAGNOSIS — Z8249 Family history of ischemic heart disease and other diseases of the circulatory system: Secondary | ICD-10-CM | POA: Diagnosis not present

## 2023-09-23 NOTE — Transitions of Care (Post Inpatient/ED Visit) (Signed)
   09/23/2023  Name: [name] MRN: [id number] DOB: Jun 04, 1940  Today's TOC FU Call Status: Today's TOC FU Call Status:: Unsuccessful Call (2nd Attempt) Unsuccessful Call (2nd Attempt) Date: 09/23/23  Attempted to reach the patient regarding the most recent Inpatient/ED visit.  Follow Up Plan: Additional outreach attempts will be made to reach the patient to complete the Transitions of Care (Post Inpatient/ED visit) call.   Cathlean Headland BSN RN [facility] Health Care Management Coordinator [email or web address] Direct Dial: [phone]  Fax: [phone] Website: [email or web address]

## 2023-09-23 NOTE — Patient Instructions (Addendum)
 [name],  It was great to see you again today and I'm hopeful you'll be out fishing again soon, now that the weather has cooled off a bit!  Before you're safe to get out and about, you NEED TO DRINK MORE FLUIDS.  We talked about lots of choices.  Anything without caffeine (which runs right through you), is just fine.  I showed your son the pinch test - so he's got my permission to pinch you :)  No changes today, but keep working on eating healthy food that doesn't upset your stomach until you're all better.  Your son has your best interest in mind!  Take care and I'll see you in about 6 months, or earlier if needed.  Catch a fish for me too!  Dr. Trudy

## 2023-09-23 NOTE — Progress Notes (Signed)
   83 y.o. [name] is here (accompanied by his son) for hospital f/u.  Hospitalized 8/21 - 09/20/2023 for partial small bowel obstruction manifested by abdominal pain with nausea vomiting and diarrhea, complicated by AKI.  He improved with bowel decompression via NGT and tolerated clear liquid diet.  Similar hospitalization 03/2023.  General surgery consultant identified no surgical issues.  He underwent IV fluid treatment for management of prerenal AKI-creatinine 1.44 on admission with a baseline of 1, improved to 1.05 at time of discharge.  Eating and drinking well since he got out of the hospital with no abdominal pain or nausea/vomiting, but feeling very weak and positionally lightneaded and frustrated with inability to do what he wants, like go to the store or go fishing or mow with the riding mower.   Doesn't feel like he would faint, doesn't feel like he may fall.  Had some questions about which foods he should be eating.  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 10/03/2022   Tinea pedis of both feet 08/30/2021   Low-level of literacy 08/30/2021   Mild cognitive impairment of uncertain or unknown etiology 08/30/2021   History of venous thromboembolism, LLE with PE, completed anticoagulation 08/29/2021   Aortic atherosclerosis (HCC) 07/16/2021   Malignant neoplasm of prostate (HCC) treated with radioactive seed implants (and XRT?) 02/15/2021   Decreased hearing of both ears 06/01/2020   Gout, chronic, without tophus 10/12/2015   Counseling regarding end of life decision making 04/13/2015   Hx of BPH with lower urinary tract symptoms (LUTS); s/p TURP 08/04/2013   Healthcare  maintenance 11/26/2012   Glaucoma 06/12/2012   Hypertension, currently not requiring medication. 12/12/2005   No current outpatient medications on file.  Objective BP 115/63 (BP Location: Left Arm, Patient Position: Sitting, Cuff Size: Small)   Pulse 60   Temp (!) 97.5 F (36.4 C) (Oral)   Ht 5' 11 (1.803 m)   Wt 167 lb (75.8 kg)   SpO2 100%   BMI 23.29 kg/m   Exam: Slender habitus, cheerful alert demeanor, loves to talk about fishing.  Skin turgor is poor at forearm and anterior upper chest but hands and feet are well perfused, warm, pink.  Heart RRR no murmurs. Softly midly protuberant abdomen, active bowel sounds, nontender.    Assessment and Plan:  Orthostatic hypotension Assessment & Plan: Hypovolemic on exam (poor skin turgor) and dizzy when standing; orthostatic BP and pulse.  Drinks insufficient fluids.  Discussed appropriate intake and drink choices.  Son also heard message and will assist.  Partial intestinal obstruction, unspecified cause [facility]) Assessment & Plan: Hospitalized 8/21-23/2025; symptoms spontaneously resolved with NGT decompression.  He is feeling back to normal with regard to abdominal sxs.  We discussed food and hydration recommendations.   AKI (acute kidney injury) [facility]) Assessment & Plan: Resolved at time of recent discharge (discharge cr 1.05).  He remains hypovolemic and will be increasing fluid intake.   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

## 2023-09-24 ENCOUNTER — Telehealth: Payer: Self-pay

## 2023-09-24 NOTE — Transitions of Care (Post Inpatient/ED Visit) (Signed)
   09/24/2023  Name: [name] MRN: [id number] DOB: 01-Nov-1940  Today's TOC FU Call Status: Today's TOC FU Call Status:: Unsuccessful Call (3rd Attempt) Unsuccessful Call (3rd Attempt) Date: 09/24/23  Attempted to reach the patient regarding the most recent Inpatient/ED visit. ON chart review it was noted patient had PCP HFU on 09/22/23.  Follow Up Plan: No further outreach attempts will be made at this time. We have been unable to contact the patient.   Bing Edison MSN, RN RN Case [employer] executive Health  [facility] Office Hours M-F [phone] Direct Dial: [phone] Main Phone [phone]  Fax: [phone] [email or web address]

## 2023-10-15 ENCOUNTER — Encounter: Payer: Self-pay | Admitting: Internal Medicine

## 2023-10-15 DIAGNOSIS — I951 Orthostatic hypotension: Secondary | ICD-10-CM | POA: Insufficient documentation

## 2023-10-15 NOTE — Assessment & Plan Note (Signed)
 Hospitalized 8/21-23/2025; symptoms spontaneously resolved with NGT decompression.  He is feeling back to normal with regard to abdominal sxs.  We discussed food and hydration recommendations.

## 2023-10-15 NOTE — Assessment & Plan Note (Signed)
 Hypovolemic on exam (poor skin turgor) and dizzy when standing; orthostatic BP and pulse.  Drinks insufficient fluids.  Discussed appropriate intake and drink choices.  Son also heard message and will assist.

## 2023-10-15 NOTE — Assessment & Plan Note (Signed)
 Resolved at time of recent discharge (discharge cr 1.05).  He remains hypovolemic and will be increasing fluid intake.

## 2023-10-31 ENCOUNTER — Other Ambulatory Visit: Payer: Self-pay

## 2023-10-31 ENCOUNTER — Emergency Department (HOSPITAL_COMMUNITY)

## 2023-10-31 ENCOUNTER — Inpatient Hospital Stay (HOSPITAL_COMMUNITY)
Admission: EM | Admit: 2023-10-31 | Discharge: 2023-11-06 | DRG: 442 | Disposition: A | Discharge status: Home / Self Care | Attending: Family Medicine | Admitting: Family Medicine

## 2023-10-31 ENCOUNTER — Encounter (HOSPITAL_COMMUNITY): Payer: Self-pay

## 2023-10-31 DIAGNOSIS — R55 Syncope and collapse: Secondary | ICD-10-CM | POA: Diagnosis present

## 2023-10-31 DIAGNOSIS — J4520 Mild intermittent asthma, uncomplicated: Secondary | ICD-10-CM | POA: Diagnosis present

## 2023-10-31 DIAGNOSIS — Z8546 Personal history of malignant neoplasm of prostate: Secondary | ICD-10-CM

## 2023-10-31 DIAGNOSIS — K2980 Duodenitis without bleeding: Secondary | ICD-10-CM

## 2023-10-31 DIAGNOSIS — Z86711 Personal history of pulmonary embolism: Secondary | ICD-10-CM | POA: Diagnosis not present

## 2023-10-31 DIAGNOSIS — K5289 Other specified noninfective gastroenteritis and colitis: Secondary | ICD-10-CM | POA: Diagnosis present

## 2023-10-31 DIAGNOSIS — Z923 Personal history of irradiation: Secondary | ICD-10-CM

## 2023-10-31 DIAGNOSIS — N400 Enlarged prostate without lower urinary tract symptoms: Secondary | ICD-10-CM | POA: Diagnosis present

## 2023-10-31 DIAGNOSIS — Z23 Encounter for immunization: Secondary | ICD-10-CM | POA: Diagnosis present

## 2023-10-31 DIAGNOSIS — K7200 Acute and subacute hepatic failure without coma: Principal | ICD-10-CM | POA: Diagnosis present

## 2023-10-31 DIAGNOSIS — N401 Enlarged prostate with lower urinary tract symptoms: Secondary | ICD-10-CM | POA: Diagnosis present

## 2023-10-31 DIAGNOSIS — E861 Hypovolemia: Secondary | ICD-10-CM | POA: Diagnosis present

## 2023-10-31 DIAGNOSIS — G3184 Mild cognitive impairment, so stated: Secondary | ICD-10-CM | POA: Diagnosis present

## 2023-10-31 DIAGNOSIS — B179 Acute viral hepatitis, unspecified: Secondary | ICD-10-CM | POA: Diagnosis not present

## 2023-10-31 DIAGNOSIS — I959 Hypotension, unspecified: Secondary | ICD-10-CM | POA: Diagnosis present

## 2023-10-31 DIAGNOSIS — Z8249 Family history of ischemic heart disease and other diseases of the circulatory system: Secondary | ICD-10-CM | POA: Diagnosis not present

## 2023-10-31 DIAGNOSIS — D649 Anemia, unspecified: Secondary | ICD-10-CM | POA: Diagnosis present

## 2023-10-31 DIAGNOSIS — Z1152 Encounter for screening for COVID-19: Secondary | ICD-10-CM | POA: Diagnosis not present

## 2023-10-31 DIAGNOSIS — M6282 Rhabdomyolysis: Secondary | ICD-10-CM | POA: Diagnosis present

## 2023-10-31 DIAGNOSIS — E785 Hyperlipidemia, unspecified: Secondary | ICD-10-CM | POA: Diagnosis present

## 2023-10-31 DIAGNOSIS — N179 Acute kidney failure, unspecified: Principal | ICD-10-CM | POA: Diagnosis present

## 2023-10-31 DIAGNOSIS — M1A9XX0 Chronic gout, unspecified, without tophus (tophi): Secondary | ICD-10-CM | POA: Diagnosis present

## 2023-10-31 DIAGNOSIS — F32A Depression, unspecified: Secondary | ICD-10-CM | POA: Diagnosis present

## 2023-10-31 DIAGNOSIS — E872 Acidosis, unspecified: Secondary | ICD-10-CM | POA: Diagnosis present

## 2023-10-31 DIAGNOSIS — E162 Hypoglycemia, unspecified: Secondary | ICD-10-CM | POA: Diagnosis present

## 2023-10-31 DIAGNOSIS — C61 Malignant neoplasm of prostate: Secondary | ICD-10-CM | POA: Diagnosis present

## 2023-10-31 DIAGNOSIS — I10 Essential (primary) hypertension: Secondary | ICD-10-CM | POA: Diagnosis present

## 2023-10-31 DIAGNOSIS — R748 Abnormal levels of other serum enzymes: Secondary | ICD-10-CM

## 2023-10-31 DIAGNOSIS — R197 Diarrhea, unspecified: Secondary | ICD-10-CM

## 2023-10-31 DIAGNOSIS — Z87891 Personal history of nicotine dependence: Secondary | ICD-10-CM | POA: Diagnosis not present

## 2023-10-31 DIAGNOSIS — Z8679 Personal history of other diseases of the circulatory system: Secondary | ICD-10-CM

## 2023-10-31 DIAGNOSIS — Z86718 Personal history of other venous thrombosis and embolism: Secondary | ICD-10-CM

## 2023-10-31 DIAGNOSIS — Z9079 Acquired absence of other genital organ(s): Secondary | ICD-10-CM

## 2023-10-31 LAB — PROTIME-INR
INR: 1.2 (ref 0.8–1.2)
Prothrombin Time: 16 s — ABNORMAL HIGH (ref 11.4–15.2)

## 2023-10-31 LAB — CBC WITH DIFFERENTIAL/PLATELET
Abs Immature Granulocytes: 0.02 K/uL (ref 0.00–0.07)
Basophils Absolute: 0 K/uL (ref 0.0–0.1)
Basophils Relative: 0 %
Eosinophils Absolute: 0 K/uL (ref 0.0–0.5)
Eosinophils Relative: 1 %
HCT: 35.5 % — ABNORMAL LOW (ref 39.0–52.0)
Hemoglobin: 11.6 g/dL — ABNORMAL LOW (ref 13.0–17.0)
Immature Granulocytes: 1 %
Lymphocytes Relative: 10 %
Lymphs Abs: 0.4 K/uL — ABNORMAL LOW (ref 0.7–4.0)
MCH: 26.9 pg (ref 26.0–34.0)
MCHC: 32.7 g/dL (ref 30.0–36.0)
MCV: 82.2 fL (ref 80.0–100.0)
Monocytes Absolute: 0.3 K/uL (ref 0.1–1.0)
Monocytes Relative: 7 %
Neutro Abs: 3.1 K/uL (ref 1.7–7.7)
Neutrophils Relative %: 81 %
Platelets: 197 K/uL (ref 150–400)
RBC: 4.32 MIL/uL (ref 4.22–5.81)
RDW: 15.5 % (ref 11.5–15.5)
WBC: 3.8 K/uL — ABNORMAL LOW (ref 4.0–10.5)
nRBC: 0 % (ref 0.0–0.2)

## 2023-10-31 LAB — COMPREHENSIVE METABOLIC PANEL WITH GFR
ALT: 861 U/L — ABNORMAL HIGH (ref 0–44)
AST: 2184 U/L — ABNORMAL HIGH (ref 15–41)
Albumin: 3.8 g/dL (ref 3.5–5.0)
Alkaline Phosphatase: 148 U/L — ABNORMAL HIGH (ref 38–126)
Anion gap: 16 — ABNORMAL HIGH (ref 5–15)
BUN: 16 mg/dL (ref 8–23)
CO2: 16 mmol/L — ABNORMAL LOW (ref 22–32)
Calcium: 9.4 mg/dL (ref 8.9–10.3)
Chloride: 106 mmol/L (ref 98–111)
Creatinine, Ser: 1.44 mg/dL — ABNORMAL HIGH (ref 0.61–1.24)
GFR, Estimated: 49 mL/min — ABNORMAL LOW (ref >60)
Glucose, Bld: 69 mg/dL — ABNORMAL LOW (ref 70–99)
Potassium: 4.3 mmol/L (ref 3.5–5.1)
Sodium: 138 mmol/L (ref 135–145)
Total Bilirubin: 1.8 mg/dL — ABNORMAL HIGH (ref 0.0–1.2)
Total Protein: 7 g/dL (ref 6.5–8.1)

## 2023-10-31 LAB — I-STAT CG4 LACTIC ACID, ED
Lactic Acid, Venous: 3 mmol/L (ref 0.5–1.9)
Lactic Acid, Venous: 2.1 mmol/L (ref 0.5–1.9)

## 2023-10-31 LAB — HEPATITIS PANEL, ACUTE
HCV Ab: NONREACTIVE
Hep A IgM: NONREACTIVE
Hep B C IgM: NONREACTIVE
Hepatitis B Surface Ag: NONREACTIVE

## 2023-10-31 LAB — TROPONIN I (HIGH SENSITIVITY)
Troponin I (High Sensitivity): 9 ng/L (ref <18)
Troponin I (High Sensitivity): 15 ng/L (ref <18)

## 2023-10-31 LAB — RESP PANEL BY RT-PCR (RSV, FLU A&B, COVID)  RVPGX2
Influenza A by PCR: NEGATIVE
Influenza B by PCR: NEGATIVE
Resp Syncytial Virus by PCR: NEGATIVE
SARS Coronavirus 2 by RT PCR: NEGATIVE

## 2023-10-31 LAB — ACETAMINOPHEN LEVEL: Acetaminophen (Tylenol), Serum: <10 ug/mL — ABNORMAL LOW (ref 10–30)

## 2023-10-31 LAB — LIPASE, BLOOD: Lipase: 59 U/L — ABNORMAL HIGH (ref 11–51)

## 2023-10-31 MED ORDER — TRAMADOL HCL 50 MG PO TABS
50.0000 mg | ORAL_TABLET | Freq: Four times a day (QID) | ORAL | Status: DC | PRN
  Administered 2023-11-03 – 2023-11-04 (×2): 50 mg via ORAL
  Filled 2023-10-31 (×2): qty 1

## 2023-10-31 MED ORDER — ENOXAPARIN SODIUM 40 MG/0.4ML IJ SOSY
40.0000 mg | PREFILLED_SYRINGE | INTRAMUSCULAR | Status: DC
  Administered 2023-11-01 – 2023-11-06 (×6): 40 mg via SUBCUTANEOUS
  Filled 2023-10-31 (×6): qty 0.4

## 2023-10-31 MED ORDER — LACTATED RINGERS IV BOLUS (SEPSIS)
1000.0000 mL | Freq: Once | INTRAVENOUS | Status: AC
  Administered 2023-10-31: 1000 mL via INTRAVENOUS

## 2023-10-31 MED ORDER — SODIUM CHLORIDE 0.9 % IV SOLN: 2.0000 g | INTRAVENOUS | Status: DC

## 2023-10-31 MED ORDER — LACTATED RINGERS IV SOLN
150.0000 mL/h | INTRAVENOUS | Status: AC
  Administered 2023-11-01 (×3): 150 mL/h via INTRAVENOUS

## 2023-10-31 MED ORDER — IOHEXOL 350 MG/ML SOLN
75.0000 mL | Freq: Once | INTRAVENOUS | Status: AC | PRN
  Administered 2023-10-31: 75 mL via INTRAVENOUS

## 2023-10-31 MED ORDER — LACTATED RINGERS IV SOLN: INTRAVENOUS | Status: DC

## 2023-10-31 MED ORDER — ONDANSETRON HCL 4 MG/2ML IJ SOLN: 4.0000 mg | Freq: Four times a day (QID) | INTRAMUSCULAR | Status: DC | PRN

## 2023-10-31 MED ORDER — SODIUM CHLORIDE 0.9 % IV SOLN
2.0000 g | Freq: Once | INTRAVENOUS | Status: AC
  Administered 2023-10-31: 2 g via INTRAVENOUS
  Filled 2023-10-31: qty 20

## 2023-10-31 MED ORDER — ONDANSETRON HCL 4 MG PO TABS
4.0000 mg | ORAL_TABLET | Freq: Four times a day (QID) | ORAL | Status: DC | PRN
  Administered 2023-11-03 (×2): 4 mg via ORAL
  Filled 2023-10-31 (×2): qty 1

## 2023-10-31 MED ORDER — DEXTROSE 50 % IV SOLN
1.0000 | Freq: Once | INTRAVENOUS | Status: AC
  Administered 2023-10-31: 50 mL via INTRAVENOUS
  Filled 2023-10-31: qty 50

## 2023-10-31 MED ORDER — METRONIDAZOLE 500 MG/100ML IV SOLN
500.0000 mg | Freq: Two times a day (BID) | INTRAVENOUS | Status: DC
  Administered 2023-11-01: 500 mg via INTRAVENOUS
  Filled 2023-10-31: qty 100

## 2023-10-31 MED ORDER — METRONIDAZOLE 500 MG/100ML IV SOLN
500.0000 mg | Freq: Once | INTRAVENOUS | Status: AC
  Administered 2023-10-31: 500 mg via INTRAVENOUS
  Filled 2023-10-31: qty 100

## 2023-10-31 NOTE — Sepsis Progress Note (Signed)
 eLink is following this Code Sepsis.

## 2023-10-31 NOTE — ED Notes (Signed)
 Unable to obtain 2nd BLC, Pt is hard stick, tried 2X

## 2023-10-31 NOTE — ED Provider Notes (Signed)
 [place] EMERGENCY DEPARTMENT AT [facility] Provider Note   CSN: [id number] Arrival date & time: 10/31/23  1112     Patient presents with: Diarrhea and Weakness   [name] is a 83 y.o. male.   83 year old male with past medical history of prostate cancer as well as hypertension and hyperlipidemia presenting to the emergency department today with nausea, vomiting, diarrhea, and near syncope.  The patient states that he woke up this morning feeling unwell.  Reports that he has had a few loose stools this morning that have been nonbloody and nonmelanotic.  Reports that he has had a few episodes of nonbloody, nonbilious emesis since then.  He came to the emergency department today for further evaluation regarding this.  He was initially found to be hypotensive with medics.  He reports that he is having little bit of chest pressure.  Denies any cough.  Has not had any fevers.        Prior to Admission medications   Not on File    Allergies: Patient has no known allergies.    Review of Systems  Constitutional:  Positive for fatigue.  Gastrointestinal:  Positive for diarrhea, nausea and vomiting.  Neurological:  Positive for weakness.  All other systems reviewed and are negative.   Updated Vital Signs BP (!) 132/59   Pulse 88   Temp 99.8 F (37.7 C) (Oral)   Resp 20   SpO2 100%   Physical Exam Vitals and nursing note reviewed.   Gen: Ill-appearing but nontoxic Eyes: PERRL, EOMI HEENT: no oropharyngeal swelling Neck: trachea midline Resp: clear to auscultation bilaterally Card: RRR, no murmurs, rubs, or gallops Abd: Moderately distended, not particularly tender Extremities: no calf tenderness, no edema Vascular: 2+ radial pulses bilaterally, 2+ DP pulses bilaterally Skin: no rashes Psyc: acting appropriately   (all labs ordered are listed, but only abnormal results are displayed) Labs Reviewed  COMPREHENSIVE METABOLIC PANEL WITH GFR - Abnormal;  Notable for the following components:      Result Value   CO2 16 (*)    Glucose, Bld 69 (*)    Creatinine, Ser 1.44 (*)    Alkaline Phosphatase 148 (*)    Total Bilirubin 1.8 (*)    GFR, Estimated 49 (*)    Anion gap 16 (*)    All other components within normal limits  PROTIME-INR - Abnormal; Notable for the following components:   Prothrombin Time 16.0 (*)    All other components within normal limits  I-STAT CG4 LACTIC ACID, ED - Abnormal; Notable for the following components:   Lactic Acid, Venous 3.0 (*)    All other components within normal limits  I-STAT CG4 LACTIC ACID, ED - Abnormal; Notable for the following components:   Lactic Acid, Venous 2.1 (*)    All other components within normal limits  CULTURE, BLOOD (ROUTINE X 2)  CULTURE, BLOOD (ROUTINE X 2) W REFLEX TO ID PANEL  CBC WITH DIFFERENTIAL/PLATELET  LIPASE, BLOOD  CBC WITH DIFFERENTIAL/PLATELET  TROPONIN I (HIGH SENSITIVITY)  TROPONIN I (HIGH SENSITIVITY)    EKG: EKG Interpretation Date/Time:  Friday October 31 2023 12:04:48 EDT Ventricular Rate:  90 PR Interval:    QRS Duration:  98 QT Interval:  338 QTC Calculation: 414 R Axis:   -23  Text Interpretation: Atrial flutter with predominant 3:1 AV block Borderline left axis deviation Abnormal R-wave progression, early transition Confirmed by Ula Barter [phone]) on 10/31/2023 12:17:23 PM  Radiology: [facility] Chest Port 1 View Result  Date: 10/31/2023 CLINICAL DATA:  Weakness, diarrhea. EXAM: PORTABLE CHEST 1 VIEW COMPARISON:  October 11, 2021. FINDINGS: The heart size and mediastinal contours are within normal limits. Both lungs are clear. The visualized skeletal structures are unremarkable. IMPRESSION: No active disease. Electronically Signed   By: Lynwood Landy Raddle M.D.   On: 10/31/2023 11:50     Procedures   Medications Ordered in the ED  lactated ringers  bolus 1,000 mL (1,000 mLs Intravenous New Bag/Given 10/31/23 1336)                                     Medical Decision Making 83 year old male with past medical history of hypertension, hyperlipidemia, and prostate cancer presenting to the emergency department today with nausea, vomiting, diarrhea, and chest pressure.  The patient is hypotensive here on arrival.  Will initiate a sepsis workup on him but hold off on antibiotics at this time as this may just be due to hypovolemia.  Will obtain a CT scan as his abdomen is moderately distended.  Will obtain EKG and troponin to evaluate for ACS.  In reviewing his most recent echocardiogram it does appear that his ejection fraction is 55 to 60% so we will give him some IV fluids with the hypotension.  Will obtain an RSV/COVID/flu swab to evaluate for potential viral etiologies and reevaluate.  If a source of infection is identified I will start the patient on antibiotics.  The patient's lactic acid is mildly elevated.  This is downtrending with IV fluids and his blood pressure is improved so this may be due to dehydration.  CBC has not resulted so antibiotics have not been started.  Signed out to oncoming provider with CT scan and CBC pending.  The patient does have an AKI.  Given his lactic acidosis, AKI, and initial hypotension he may require admission for further evaluation.  Amount and/or Complexity of Data Reviewed Labs: ordered. Radiology: ordered.        Final diagnoses:  AKI (acute kidney injury)  Lactic acidosis    ED Discharge Orders     None          Ula Prentice SAUNDERS, MD 10/31/23 1452

## 2023-10-31 NOTE — ED Triage Notes (Addendum)
 Pt BIB [facility] from home for diarrhea, 1 episode of emesis, and possible syncopal episode. Per EMS pt began having diarrhea last night, had 1 episode of emesis this morning, feels weak and lethargic. BP 80/30 per EMS with all other VSS.

## 2023-10-31 NOTE — ED Notes (Signed)
 Patient transported to CT; No changes

## 2023-10-31 NOTE — ED Notes (Signed)
 Assuming pt care, pt bib ems coming from home for diarrhea, n/v, and fatigue x 1 day. Pt laying in bed, aaox4, lethargic, ambulates with assistance, pt instructed on providing stool sample. Denies pain/discomfort at this time, call bell within reach

## 2023-10-31 NOTE — H&P (Signed)
 History and Physical    Patient: [name] FMW:[id number] DOB: 11/19/1940 DOA: 10/31/2023 DOS: the patient was seen and examined on 10/31/2023 PCP: [name], MD  Patient coming from: Home  Chief Complaint:  Chief Complaint  Patient presents with   Diarrhea   Weakness   HPI: [name] is a 83 y.o. male with medical history significant of BPH, history of prostate cancer, history of DVT of the lower extremity, history of colitis, prior PE, essential hypertension, hyperlipidemia, who was brought in from home with complaint of abdominal pain, diarrhea and vomiting.  Patient also suspected to have possible syncopal episode.  He started having diarrhea last night and had 1 episode of emesis this morning.  Patient then felt weak and lethargic.  When EMS arrived his blood pressure was 80/30.  Patient received some fluid and was brought to the ER. Denies any melena.  No bright red blood per rectum.  In the ER patient was evaluated.  He was found to have markedly elevated LFTs.  AST more than 2000 and ALT of about 800.  Denied history of alcoholism and no Tylenol  intake. Patient also meets sepsis criteria with white count of 3.8 and heart rate of 101.  CT abdomen pelvis suggested duodenitis but no clear-cut cause.  Suspected acute hepatitis probably viral hepatitis.  Patient being admitted to the hospital for further evaluation and treatment.  GI has been consulted.  Review of Systems: As mentioned in the history of present illness. All other systems reviewed and are negative. Past Medical History:  Diagnosis Date   Benign localized prostatic hyperplasia with lower urinary tract symptoms (LUTS)    Chronic gout    followed by pcp   Full dentures    History of colitis 2005   per colonoscopy chronic inflammation colits secondary to ascending colon ulceration   History of diverticulitis of colon    History of DVT of lower extremity 09/13/2006   LLE   History of hypertension, not  currently requiring treatment 12/12/2005            History of pulmonary embolism 09/13/2006   bilateral PE secondary to DVT of LLE in 2008 On Warfarin until 01/2010 when he was admitted for syncope and noticed that he had completed course of warfarin and it was stopped.   HTN (hypertension)    followed by pcp   (05-12-2023no medication since 01/ 2023 by pt's pcp)   Hx of acute blood loss anemia due to radiation proctitis, hg normalized after transfusion 07/16/2021   Hx of aortic root dilation (HCC), resolved 01/10/2012   2012: Left ventricle: no LVH.  EF 60% to 65%.  Aorta: Mildly dilated aortic root. Aortic root dimension: 39mm  2015 : Aortic root: The aortic root was top normal in size.  2015 : Stable diffuse thoracic aortic ectasia with maximum diameter of the ascending aorta to 4.6 cm.  2017 ECHO ascending aorta nl in size     Hyperlipidemia    Malignant neoplasm prostate (HCC) 01/2021   urologist-- dr bell;  dx 01/ 2023, Gleason 4+4   Mild intermittent asthma    PFT's 05/2012 : FEV1 / FVC 75 and 78 pre and post BD. FEV1 74 and 75 pre and post BD. TLC 72. RV 93. DLCO 79. Read as restrictive lung disease, possible with obstructive lung disease. Prior smoker.     Mild intermittent asthma, uncomplicated 07/13/2013   PFT's 05/2012 : FEV1 / FVC 75 and 78 pre and post BD.  FEV1 74 and 75 pre and post BD. TLC 72. RV 93. DLCO 79. Read as restrictive lung disease, possible with obstructive lung disease.  Prior smoker.    OA (osteoarthritis) of knee 11/05/2013   Partial bowel obstruction (HCC) 09/18/2023   Radiation proctitis 07/28/2021   Marked showed marked rectal thickening and perirectal stranding, findings compatible with radiation proctitis. His blood and urine culture showed no growth. He received Vanco, cefepime   and Flagyl  during hospitalization which was changed to Rocephin , and Flagyl . He was discharged on Omnicef  and Flagyl  to cover proctitis for 10 days for a total 14 days.    Recurrent gross  hematuria due to radiation cystitis 10/03/2022   Syncope 07/16/2021   Past Surgical History:  Procedure Laterality Date   GOLD SEED IMPLANT N/A 06/13/2021   Procedure: GOLD SEED IMPLANT;  Surgeon: Carolee Sherwood JONETTA DOUGLAS, MD;  Location: [facility];  Service: Urology;  Laterality: N/A;   I & D EXTREMITY Right 03/04/2014   Procedure: IRRIGATION AND DEBRIDEMENT FINGER / HAND;  Surgeon: Balinda Rogue, MD;  Location: [facility] OR;  Service: Plastics;  Laterality: Right;  I&D Right Small Finger and Right Wrist   KNEE ARTHROSCOPY Left 2000   SPACE OAR INSTILLATION N/A 06/13/2021   Procedure: SPACE OAR INSTILLATION;  Surgeon: Carolee Sherwood JONETTA DOUGLAS, MD;  Location: [facility];  Service: Urology;  Laterality: N/A;   TRANSURETHRAL RESECTION OF PROSTATE  04/01/2003   @[facility]   by dr andra   Social History:  reports that he has never smoked. He has never used smokeless tobacco. He reports that he does not drink alcohol and does not use drugs.  No Known Allergies  Family History  Problem Relation Age of Onset   Heart disease Father    Stroke Father    Heart disease Brother 68    Prior to Admission medications   Not on File    Physical Exam: Vitals:   10/31/23 1515 10/31/23 1539 10/31/23 1837 10/31/23 2015  BP: 122/62 (!) 120/57 (!) 184/164 (!) 127/55  Pulse: 86 (!) 101 87 75  Resp: 16 20 18 19   Temp:  99.6 F (37.6 C) 99.2 F (37.3 C)   TempSrc:  Oral Oral   SpO2: 100% 94% 100% 100%   Constitutional: Acutely ill looking no distress NAD, calm, comfortable Eyes: PERRL, lids and conjunctivae normal ENMT: Mucous membranes are moist. Posterior pharynx clear of any exudate or lesions.Normal dentition.  Neck: normal, supple, no masses, no thyromegaly Respiratory: clear to auscultation bilaterally, no wheezing, no crackles. Normal respiratory effort. No accessory muscle use.  Cardiovascular: Sinus tachycardia, no murmurs / rubs / gallops. No extremity edema. 2+ pedal pulses. No  carotid bruits.  Abdomen: Mild diffuse tenderness, no masses palpated. No hepatosplenomegaly. Bowel sounds positive.  Musculoskeletal: Good range of motion, no joint swelling or tenderness, Skin: no rashes, lesions, ulcers. No induration Neurologic: CN 2-12 grossly intact. Sensation intact, DTR normal. Strength 5/5 in all 4.  Psychiatric: Slightly drowsy, alert and oriented x 3. Normal mood  Data Reviewed:  Temperature 99.8, blood pressure 184/64, pulse 101, white count 3.8 hemoglobin 11.6 and creatinine 1.44.  Initial lactic acid 2.1and then 3.0.  Alkaline phosphatase is 148 lipase 59.  AST 2184, ALT 861.  Total bilirubin 1.8 PT 16.0 INR 1.2.  Glucose of 69.  Acute viral screen is negative for influenza COVID-19 and RSV.  CT abdomen pelvis showed moderately distended gallbladder with gallbladder wall thickening versus pericholecystic fluid.  No calcified gallstone.  There is any edema about the third portion of the duodenum which is likely reactive duodenitis is less likely.  Colonic diverticulosis.  Abdominal ultrasound showed mild gallbladder wall thickening along the hepatic aspect the gallbladder probably upper abdominal inflammation including acute hepatitis or volume overload.  Assessment and Plan:  #1 sepsis due to acute hepatitis: Cause is unknown.  Differentials include acute viral hepatitis, hepatic injury from the variety of causes, alcohol related but patient does not drink, no Tylenol  intake but level pending.  Patient will be admitted for supportive care.  Avoid hepatotoxic medications.  GI consulted.  Acute viral hepatitis panel also pending.  Follow LFTs  #2 Hypoglycemia: Probably poor oral intake.  Could be hepatic related to.  Monitor blood sugar.  Will give some glucose as needed.  #3 essential hypertension: Not on current treatment.  Monitor and treat as necessary  #4 history of prostate cancer: In remission.  #5 AKI: Hydrate and follow renal function  #6 lactic acidosis:  Hydrate and follow lipase.  Could be due to early sepsis    Advance Care Planning:   Code Status: Full Code   Consults: Gastroenterology Dr. Rollin  Family Communication: No family at bedside  Severity of Illness: The appropriate patient status for this patient is INPATIENT. Inpatient status is judged to be reasonable and necessary in order to provide the required intensity of service to ensure the patient's safety. The patient's presenting symptoms, physical exam findings, and initial radiographic and laboratory data in the context of their chronic comorbidities is felt to place them at high risk for further clinical deterioration. Furthermore, it is not anticipated that the patient will be medically stable for discharge from the hospital within 2 midnights of admission.   * I certify that at the point of admission it is my clinical judgment that the patient will require inpatient hospital care spanning beyond 2 midnights from the point of admission due to high intensity of service, high risk for further deterioration and high frequency of surveillance required.*  AuthorBETHA SIM KNOLL, MD 10/31/2023 9:14 PM  For on call review [email or web address].

## 2023-10-31 NOTE — ED Provider Notes (Signed)
 Ultimately patient handed off to me awaiting CT scan and labs.  Here with diarrhea abdominal pain.  Low-grade fever.  Lactic acid elevated at 3.  White count 3.8.  CT scan did show duodenitis inflammation of the gallbladder but no gallstones or clear-cut cholecystitis.  Lipase is 59.  INR normal at 1.2.  However liver enzymes elevated with an AST of 2000 and ALT of 861, alk phos of 148 and bilirubin of 1.8.  That time I started IV antibiotics IV fluids.  CT scan showed maybe inflammation in this area.  However ultrasound did not show clear-cut cholecystitis.  No Murphy sign no gallstones.  I talked with Dr. Stevie with general surgery and Dr. Rollin with gastroenterology.  Ultimately he thinks likely some sort of viral process likely causing this given the diarrhea as well.  I sent off hepatitis panel Tylenol  level.  Will hydrate and trend labs overnight.  My suspicion is that this is likely from infectious/viral process given the clinical picture.  GI will follow along.  Can consult surgery if needed but at this time not a surgical candidate.  Limit to hospitalist for further care.  Lactic acid improving to 2.1.  This chart was dictated using voice recognition software.  Despite best efforts to proofread,  errors can occur which can change the documentation meaning.   .Critical Care  Performed by: Ruthe Cornet, DO Authorized by: Ruthe Cornet, DO   Critical care provider statement:    Critical care time (minutes):  35   Critical care was necessary to treat or prevent imminent or life-threatening deterioration of the following conditions:  Sepsis (elevated liver enzymes)   Critical care was time spent personally by me on the following activities:  Blood draw for specimens, development of treatment plan with patient or surrogate, discussions with consultants, discussions with primary provider, evaluation of patient's response to treatment, examination of patient, obtaining history from patient or  surrogate, ordering and performing treatments and interventions, ordering and review of laboratory studies, ordering and review of radiographic studies, re-evaluation of patient's condition, pulse oximetry, review of old charts and transcutaneous pacing   Care discussed with: admitting provider       Ruthe Cornet, DO 10/31/23 2020

## 2023-11-01 DIAGNOSIS — B179 Acute viral hepatitis, unspecified: Secondary | ICD-10-CM | POA: Diagnosis not present

## 2023-11-01 LAB — COMPREHENSIVE METABOLIC PANEL WITH GFR
ALT: 675 U/L — ABNORMAL HIGH (ref 0–44)
AST: 830 U/L — ABNORMAL HIGH (ref 15–41)
Albumin: 3.1 g/dL — ABNORMAL LOW (ref 3.5–5.0)
Alkaline Phosphatase: 156 U/L — ABNORMAL HIGH (ref 38–126)
Anion gap: 11 (ref 5–15)
BUN: 12 mg/dL (ref 8–23)
CO2: 21 mmol/L — ABNORMAL LOW (ref 22–32)
Calcium: 9 mg/dL (ref 8.9–10.3)
Chloride: 105 mmol/L (ref 98–111)
Creatinine, Ser: 1.23 mg/dL (ref 0.61–1.24)
GFR, Estimated: 59 mL/min — ABNORMAL LOW (ref >60)
Glucose, Bld: 94 mg/dL (ref 70–99)
Potassium: 3.8 mmol/L (ref 3.5–5.1)
Sodium: 137 mmol/L (ref 135–145)
Total Bilirubin: 1.7 mg/dL — ABNORMAL HIGH (ref 0.0–1.2)
Total Protein: 6.4 g/dL — ABNORMAL LOW (ref 6.5–8.1)

## 2023-11-01 LAB — CORTISOL-AM, BLOOD: Cortisol - AM: 11.1 ug/dL (ref 6.7–22.6)

## 2023-11-01 LAB — C DIFFICILE QUICK SCREEN W PCR REFLEX
C Diff antigen: NEGATIVE
C Diff interpretation: NOT DETECTED
C Diff toxin: NEGATIVE

## 2023-11-01 LAB — GASTROINTESTINAL PANEL BY PCR, STOOL (REPLACES STOOL CULTURE)
Adenovirus F40/41: NOT DETECTED
Astrovirus: DETECTED — AB
Campylobacter species: NOT DETECTED
Cryptosporidium: NOT DETECTED
Cyclospora cayetanensis: NOT DETECTED
Entamoeba histolytica: NOT DETECTED
Enteroaggregative E coli (EAEC): NOT DETECTED
Enteropathogenic E coli (EPEC): DETECTED — AB
Enterotoxigenic E coli (ETEC): NOT DETECTED
Giardia lamblia: NOT DETECTED
Norovirus GI/GII: NOT DETECTED
Plesimonas shigelloides: DETECTED — AB
Rotavirus A: NOT DETECTED
Salmonella species: NOT DETECTED
Sapovirus (I, II, IV, and V): NOT DETECTED
Shiga like toxin producing E coli (STEC): NOT DETECTED
Shigella/Enteroinvasive E coli (EIEC): NOT DETECTED
Vibrio cholerae: NOT DETECTED
Vibrio species: NOT DETECTED
Yersinia enterocolitica: NOT DETECTED

## 2023-11-01 LAB — CBC
HCT: 31.2 % — ABNORMAL LOW (ref 39.0–52.0)
Hemoglobin: 10.3 g/dL — ABNORMAL LOW (ref 13.0–17.0)
MCH: 26.5 pg (ref 26.0–34.0)
MCHC: 33 g/dL (ref 30.0–36.0)
MCV: 80.4 fL (ref 80.0–100.0)
Platelets: 207 K/uL (ref 150–400)
RBC: 3.88 MIL/uL — ABNORMAL LOW (ref 4.22–5.81)
RDW: 15.8 % — ABNORMAL HIGH (ref 11.5–15.5)
WBC: 6.5 K/uL (ref 4.0–10.5)
nRBC: 0 % (ref 0.0–0.2)

## 2023-11-01 LAB — CK TOTAL AND CKMB (NOT AT ARMC)
CK, MB: 7.5 ng/mL — ABNORMAL HIGH (ref 0.5–5.0)
Total CK: 1694 U/L — ABNORMAL HIGH (ref 49–397)

## 2023-11-01 LAB — PROTIME-INR
INR: 1.6 — ABNORMAL HIGH (ref 0.8–1.2)
Prothrombin Time: 19.4 s — ABNORMAL HIGH (ref 11.4–15.2)

## 2023-11-01 LAB — GAMMA GT: GGT: 145 U/L — ABNORMAL HIGH (ref 7–50)

## 2023-11-01 NOTE — Plan of Care (Signed)

## 2023-11-01 NOTE — Hospital Course (Addendum)
 83 y.o. M with HTN, HLD, history PE, prosCA, prior asthma, hx HSV, and hx internal hernia SBO last Feb presented with N/V and near syncope.    In the ER, hypotensive and sluggish and AST/ALT 2000/861, with Tbili 1.8. CT abdomen showed possible duodenitis, possible GB thickening, no other focal findings.  Admitted on IV fluids and antibiotics. GI consulted.      Assessment and plan: Ischemic hepatitis Initial presenting complaint.  GI consulted, viral hepatitis serologies negative, determined to be due to hypovolemia from colitis.  Now resolved.   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  GI panel positive for multiple species.  Initially on Rocephin /Flagyl , this was held after day 1, but given poor recovery, Abx restarted on 10/6, oral on purpose.  Tolerating well.  Stools slowing but still with pain, vomiting and dizziness. - Continue Cipro , Flagyl , day 3 of 5  Hypertension Not on medication at home  Hyperlipidemia Not on medication  Anemia Mild, no bleeding noted  AKI Baseline Cr 1.0, Cr here 1.4 on admission, now resolved.     Abnormal gallbladder on imaging Serial abdominal exams normal, no post-prandial pain.  In absence of RUQ pain, I have very low suspicion for cholectystitis at this time.

## 2023-11-01 NOTE — Progress Notes (Addendum)
  Progress Note   Patient: [name] FMW:[id number] DOB: [birth year] DOA: 10/31/2023     1 DOS: the patient was seen and examined on 11/01/2023 at 9:22AM      Brief hospital course: 83 y.o. M with HTN, HLD, history PE, prosCA, prior asthma, hx HSV, and hx internal hernia SBO last Feb presented with N/V and near syncope.    In the ER, hypotensive and sluggish and AST/ALT 2000/861, with Tbili 1.8. CT abdomen showed possible duodenitis, possible GB thickening, no other focal findings.  Admitted on IV fluids and antibiotics. GI consulted.      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  No abdominal pain but vague abdominal upset.  Cdiff negative.   Viral hepatitis serologies negative, no history excessive acetaminophen .  Denies alcohol.  COVID and flu negative.  Certainly the pattern of transaminases suggests ischemic injury, c/w the recorded hypotension on arrival. - IV fluids - Trend LFts - Consult GI - Follow GI pathogen panel  ADDENDUM: Multiple infections on GI panel.   Hypertension Not on medication  Hyperlipidemia Not on medication  Anemia Mild, no bleeding noted  AKI Baseline Cr 1.0, Cr here 1.4 on admission - IV fluids - Avoid nephrotoxins  Abnormal gallbladder on imaging In absence of RUQ pain, I have very low suspicion for cholectystitis at this time.   - Serial abdominal exmas           Subjective: Feeling somewhat better, still weak.  Still weith loose stools.  No bleeding. No vomiting, no abdominal pain.     Physical Exam: BP 107/84   Pulse 73   Temp 98 F (36.7 C) (Oral)   Resp 17   Ht 5' 11 (1.803 m)   Wt 77.6 kg   SpO2 100%   BMI 23.86 kg/m   Elderly adult male, lying in bed, no acute distress RRR, no murmurs, no peripheral edema Respiratory rate  normal, lungs clear without rales or wheezes Abdomen soft, no tenderness or guarding at all quadrants, no pain to palpation in the right upper quadrant normal Attention normal, affect normal, oriented to person, place, time, memory seems slightly impaired but at baseline, upper extremity strength generally weak but symmetric, lower extremity strength normal    Data Reviewed: Discussed with gastroenterology CBC shows mild anemia, probably dilution, no leukocytosis Basic metabolic panel shows resolution of AKI, normal electrolytes LFTs improving  CT abdomen right upper quadrant ultrasound report reviewed    Family Communication:     Disposition: Status is: Inpatient         Author: Lonni SHAUNNA Dalton, MD 11/01/2023 2:25 PM  For on call review [email or web address].
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
## 2023-11-01 NOTE — Plan of Care (Signed)

## 2023-11-02 DIAGNOSIS — B179 Acute viral hepatitis, unspecified: Secondary | ICD-10-CM | POA: Diagnosis not present

## 2023-11-02 LAB — CK: Total CK: 743 U/L — ABNORMAL HIGH (ref 49–397)

## 2023-11-02 LAB — CBC
HCT: 30.9 % — ABNORMAL LOW (ref 39.0–52.0)
Hemoglobin: 10.4 g/dL — ABNORMAL LOW (ref 13.0–17.0)
MCH: 26.7 pg (ref 26.0–34.0)
MCHC: 33.7 g/dL (ref 30.0–36.0)
MCV: 79.2 fL — ABNORMAL LOW (ref 80.0–100.0)
Platelets: 209 K/uL (ref 150–400)
RBC: 3.9 MIL/uL — ABNORMAL LOW (ref 4.22–5.81)
RDW: 15.6 % — ABNORMAL HIGH (ref 11.5–15.5)
WBC: 5.1 K/uL (ref 4.0–10.5)
nRBC: 0 % (ref 0.0–0.2)

## 2023-11-02 LAB — COMPREHENSIVE METABOLIC PANEL WITH GFR
ALT: 478 U/L — ABNORMAL HIGH (ref 0–44)
AST: 466 U/L — ABNORMAL HIGH (ref 15–41)
Albumin: 2.9 g/dL — ABNORMAL LOW (ref 3.5–5.0)
Alkaline Phosphatase: 139 U/L — ABNORMAL HIGH (ref 38–126)
Anion gap: 9 (ref 5–15)
BUN: 13 mg/dL (ref 8–23)
CO2: 25 mmol/L (ref 22–32)
Calcium: 8.9 mg/dL (ref 8.9–10.3)
Chloride: 105 mmol/L (ref 98–111)
Creatinine, Ser: 1.16 mg/dL (ref 0.61–1.24)
GFR, Estimated: >60 mL/min (ref >60)
Glucose, Bld: 94 mg/dL (ref 70–99)
Potassium: 3.6 mmol/L (ref 3.5–5.1)
Sodium: 139 mmol/L (ref 135–145)
Total Bilirubin: 0.9 mg/dL (ref 0.0–1.2)
Total Protein: 5.9 g/dL — ABNORMAL LOW (ref 6.5–8.1)

## 2023-11-02 LAB — HIV ANTIBODY (ROUTINE TESTING W REFLEX): HIV Screen 4th Generation wRfx: NONREACTIVE

## 2023-11-02 NOTE — Progress Notes (Signed)
 [facility] Progress Note  SUBJECTIVE:   Interval history: [name] was seen and evaluated today at bedside. Resting comfortably in bed with daughter at bedside. Son on telephone. No abdominal pain, nausea, vomiting, chest pain, shortness of breath. Having diarrhea. No reported EtOH use.  Past Medical History:  Diagnosis Date   Benign localized prostatic hyperplasia with lower urinary tract symptoms (LUTS)    Chronic gout    followed by pcp   Full dentures    History of colitis 2005   per colonoscopy chronic inflammation colits secondary to ascending colon ulceration   History of diverticulitis of colon    History of DVT of lower extremity 09/13/2006   LLE   History of hypertension, not currently requiring treatment 12/12/2005            History of pulmonary embolism 09/13/2006   bilateral PE secondary to DVT of LLE in 2008 On Warfarin until 01/2010 when he was admitted for syncope and noticed that he had completed course of warfarin and it was stopped.   HTN (hypertension)    followed by pcp   (05-12-2023no medication since 01/ 2023 by pt's pcp)   Hx of acute blood loss anemia due to radiation proctitis, hg normalized after transfusion 07/16/2021   Hx of aortic root dilation (HCC), resolved 01/10/2012   2012: Left ventricle: no LVH.  EF 60% to 65%.  Aorta: Mildly dilated aortic root. Aortic root dimension: 39mm  2015 : Aortic root: The aortic root was top normal in size.  2015 : Stable diffuse thoracic aortic ectasia with maximum diameter of the ascending aorta to 4.6 cm.  2017 ECHO ascending aorta nl in size     Hyperlipidemia    Malignant neoplasm prostate (HCC) 01/2021   urologist-- dr bell;  dx 01/ 2023, Gleason 4+4   Mild intermittent asthma    PFT's 05/2012 : FEV1 / FVC 75 and 78 pre and post BD. FEV1 74 and 75 pre and post BD. TLC 72. RV 93. DLCO 79. Read as restrictive lung disease, possible with obstructive lung disease. Prior smoker.     Mild intermittent  asthma, uncomplicated 07/13/2013   PFT's 05/2012 : FEV1 / FVC 75 and 78 pre and post BD. FEV1 74 and 75 pre and post BD. TLC 72. RV 93. DLCO 79. Read as restrictive lung disease, possible with obstructive lung disease.  Prior smoker.    OA (osteoarthritis) of knee 11/05/2013   Partial bowel obstruction (HCC) 09/18/2023   Radiation proctitis 07/28/2021   Marked showed marked rectal thickening and perirectal stranding, findings compatible with radiation proctitis. His blood and urine culture showed no growth. He received Vanco, cefepime   and Flagyl  during hospitalization which was changed to Rocephin , and Flagyl . He was discharged on Omnicef  and Flagyl  to cover proctitis for 10 days for a total 14 days.    Recurrent gross hematuria due to radiation cystitis 10/03/2022   Syncope 07/16/2021   Past Surgical History:  Procedure Laterality Date   GOLD SEED IMPLANT N/A 06/13/2021   Procedure: GOLD SEED IMPLANT;  Surgeon: Carolee Sherwood JONETTA DOUGLAS, MD;  Location: [facility];  Service: Urology;  Laterality: N/A;   I & D EXTREMITY Right 03/04/2014   Procedure: IRRIGATION AND DEBRIDEMENT FINGER / HAND;  Surgeon: Balinda Rogue, MD;  Location: [facility];  Service: Plastics;  Laterality: Right;  I&D Right Small Finger and Right Wrist   KNEE ARTHROSCOPY Left 2000   SPACE OAR INSTILLATION N/A 06/13/2021   Procedure:  SPACE OAR INSTILLATION;  Surgeon: Carolee Sherwood JONETTA DOUGLAS, MD;  Location: [facility];  Service: Urology;  Laterality: N/A;   TRANSURETHRAL RESECTION OF PROSTATE  04/01/2003   @[facility]   by dr andra   Current Facility-Administered Medications  Medication Dose Route Frequency Provider Last Rate Last Admin   enoxaparin  (LOVENOX ) injection 40 mg  40 mg Subcutaneous Q24H Garba, Mohammad L, MD   40 mg at 11/02/23 1004   ondansetron  (ZOFRAN ) tablet 4 mg  4 mg Oral Q6H PRN Garba, Mohammad L, MD       traMADol (ULTRAM) tablet 50 mg  50 mg Oral Q6H PRN Sim Emery CROME, MD       Allergies as  of 10/31/2023   (No Known Allergies)   Review of Systems:  Review of Systems  Respiratory:  Negative for shortness of breath.   Cardiovascular:  Negative for chest pain.  Gastrointestinal:  Positive for diarrhea. Negative for abdominal pain, nausea and vomiting.    OBJECTIVE:   Temp:  [98.1 F (36.7 C)-98.2 F (36.8 C)] 98.2 F (36.8 C) (10/05 0843) Pulse Rate:  [65-76] 65 (10/05 1133) Resp:  [17-18] 17 (10/05 0843) BP: (128-149)/(63-86) 149/75 (10/05 1133) SpO2:  [100 %] 100 % (10/05 0843) Last BM Date : 11/02/23 Physical Exam Constitutional:      General: He is not in acute distress.    Appearance: He is not ill-appearing, toxic-appearing or diaphoretic.  Cardiovascular:     Rate and Rhythm: Normal rate and regular rhythm.  Pulmonary:     Effort: No respiratory distress.     Breath sounds: Normal breath sounds.  Abdominal:     General: Bowel sounds are normal. There is no distension.     Palpations: Abdomen is soft.     Tenderness: There is no abdominal tenderness. There is no guarding.  Skin:    General: Skin is warm and dry.  Neurological:     Mental Status: He is alert.     Labs: Recent Labs    10/31/23 1600 11/01/23 1351 11/02/23 0334  WBC 3.8* 6.5 5.1  HGB 11.6* 10.3* 10.4*  HCT 35.5* 31.2* 30.9*  PLT 197 207 209   BMET Recent Labs    10/31/23 1217 11/01/23 1027 11/02/23 0334  NA 138 137 139  K 4.3 3.8 3.6  CL 106 105 105  CO2 16* 21* 25  GLUCOSE 69* 94 94  BUN 16 12 13   CREATININE 1.44* 1.23 1.16  CALCIUM 9.4 9.0 8.9   LFT Recent Labs    11/02/23 0334  PROT 5.9*  ALBUMIN 2.9*  AST 466*  ALT 478*  ALKPHOS 139*  BILITOT 0.9   PT/INR Recent Labs    10/31/23 1217 11/01/23 1027  LABPROT 16.0* 19.4*  INR 1.2 1.6*   Diagnostic imaging: US  Abdomen Limited RUQ (LIVER/GB) Result Date: 10/31/2023 CLINICAL DATA:  221910 Elevated LFTs 221910 EXAM: ULTRASOUND ABDOMEN LIMITED RIGHT UPPER QUADRANT COMPARISON:  None Available. FINDINGS:  Gallbladder: No gallstones. Mild wall thickening along the hepatic surface of the gallbladder with trace pericholecystic fluid. No sonographic Murphy's sign noted by sonographer. Common bile duct: Diameter: 4 mm Liver: Normal echogenicity. No focal lesion identified. No intrahepatic biliary ductal dilation. Portal vein is patent on color Doppler imaging with normal direction of blood flow towards the liver. Right Kidney: Partially visualized. Partially visualized right renal cyst again noted. No hydronephrosis or nephrolithiasis. Other: None. IMPRESSION: Mild gallbladder wall thickening along the hepatic aspect of the gallbladder with trace pericholecystic fluid. In  the absence of a sonographic Murphy's sign and gallstones, this may be due to upper abdominal inflammation, acute hepatitis, or volume overload from either CHF or renal failure. Laboratory correlation recommended. Electronically Signed   By: Rogelia Myers M.D.   On: 10/31/2023 19:36   CT ABDOMEN PELVIS W CONTRAST Result Date: 10/31/2023 CLINICAL DATA:  Acute abdominal pain. EXAM: CT ABDOMEN AND PELVIS WITH CONTRAST TECHNIQUE: Multidetector CT imaging of the abdomen and pelvis was performed using the standard protocol following bolus administration of intravenous contrast. RADIATION DOSE REDUCTION: This exam was performed according to the departmental dose-optimization program which includes automated exposure control, adjustment of the mA and/or kV according to patient size and/or use of iterative reconstruction technique. CONTRAST:  75mL OMNIPAQUE  IOHEXOL  350 MG/ML SOLN COMPARISON:  Most recent comparison CT 09/18/2023 FINDINGS: Lower chest: Hepatobiliary: Subsegmental atelectasis in the dependent lungs with trace right pleural thickening. The heart is normal in size. Pancreas: Punctate granuloma in the liver. No focal liver lesion. The gallbladder is moderately distended. Gallbladder wall thickening versus pericholecystic fluid, series 6, image  56. No calcified gallstone. Pericholecystic edema tracks inferiorly in the anterior pararenal space no biliary dilatation. Spleen: Normal in size without focal abnormality. Adrenals/Urinary Tract: Normal adrenal glands. Stable bilateral renal cysts. No further follow-up imaging is recommended. No hydronephrosis or renal inflammation. Partially distended urinary bladder. Mild wall thickening inferiorly. Stomach/Bowel: Fluid-filled stomach and proximal small bowel. No abnormal small bowel distension. Duodenal diverticulum. Stranding about the third portion of the duodenum is likely reactive; no definite wall thickening. The appendix is normal. Small to moderate colonic stool burden. Left colonic diverticulosis, prominent in the descending and sigmoid colon. Sigmoid is redundant. Vascular/Lymphatic: Aortic and branch atherosclerosis. No aortic aneurysm. Portal vein is patent. No enlarged lymph nodes in the abdomen or pelvis. Reproductive: TURP defect in the prostate. Probable urolith. Retractile testis suspected in the inguinal canals. Other: Free fluid in the right upper quadrant with inflammatory fat stranding. No free air. No discrete fluid collection. Small bilateral fat containing inguinal hernias. Musculoskeletal: There are no acute or suspicious osseous abnormalities. Scattered degenerative change in Schmorl's nodes in the spine. Bilateral hip osteoarthritis. IMPRESSION: 1. Moderately distended gallbladder with gallbladder wall thickening versus pericholecystic fluid. No calcified gallstone. Findings may represent for acute cholecystitis. Recommend right upper quadrant ultrasound for further evaluation. 2. Edema about the third portion of the duodenum is felt to be reactive due to gallbladder inflammation, duodenitis is felt less likely, but not entirely excluded. 3. Colonic diverticulosis without diverticulitis. 4. Mild wall thickening of the inferior urinary bladder, may be due to chronic bladder outlet  obstruction or cystitis. Aortic Atherosclerosis (ICD10-I70.0). Electronically Signed   By: Andrea Gasman M.D.   On: 10/31/2023 17:28   IMPRESSION: Transaminase elevation in mixed pattern Shock liver Rhabdomyolysis Acute diarrhea             -GI pathogen panel (+) Plesimonas shigelloides, enteropathogenic E.Coli, astrovirus             -C.difficile (-) Prostate cancer status post radiation History radiation proctitis Normocytic anemia Diverticulosis  PLAN: -Liver enzyme trend improving, continue supportive care -Can monitor bowel movements off antibiotic therapy  -Outpatient GI follow up to monitor liver enzymes, will expand serologic workup if improvement stagnates -Appears stable for discharge   LOS: 2 days   Estefana Keas, [facility]

## 2023-11-02 NOTE — Progress Notes (Signed)
  Progress Note   Patient: [name] FMW:[id number] DOB: 1940/02/03 DOA: 10/31/2023     2 DOS: the patient was seen and examined on 11/02/2023 at 10:30AM      Brief hospital course: 83 y.o. M with HTN, HLD, history PE, prosCA, prior asthma, hx HSV, and hx internal hernia SBO last Feb presented with N/V and near syncope.    In the ER, hypotensive and sluggish and AST/ALT 2000/861, with Tbili 1.8. CT abdomen showed possible duodenitis, possible GB thickening, no other focal findings.  Admitted on IV fluids and antibiotics. GI consulted.      Assessment and plan: Hepatitis Diarrhea due to EPEC P shigelloides and astrovirus colitis -Continue IV fluids - Trend LFTs - Trend stool pattern     AKI Resolved with fluids            Subjective: Patient still having 4 bowel movements since midnight.  No vomiting.  Still weak and dizzy with standing.     Physical Exam: BP 137/73   Pulse 61   Temp (!) 97.5 F (36.4 C) (Oral)   Resp 17   Ht 5' 11 (1.803 m)   Wt 77.6 kg   SpO2 98%   BMI 23.86 kg/m   Adult male, sitting up in recliner, no acute distress RRR, no murmurs, no peripheral edema Respiratory rate normal, lungs clear without rales or wheezes Abdomen soft without tenderness palpation or guarding Attention normal, affect normal, judgment and insight appear normal  Data Reviewed: Basic metabolic panel shows creatinine down to 1.1 CBC shows hemoglobin down to 10.4, white count normal  LFTs trending down  Family Communication: Nephew at the bedside    Disposition: Status is: Inpatient The patient was admitted with ischemic hepatopathy, due to a PICC, present on his shingler days, and astrovirus colitis  He is still having diarrhea and dizziness with standing, and I recommend continued treatment in the hospital for another 24 hours, if his stool pattern improves by tomorrow, likely home        Author: Lonni SHAUNNA Dalton, MD 11/02/2023 4:37  PM  For on call review [email or web address].

## 2023-11-02 NOTE — Plan of Care (Signed)

## 2023-11-02 NOTE — Evaluation (Signed)
 Physical Therapy Evaluation Patient Details Name: [name] MRN: [id number] DOB: 03-06-1940 Today's Date: 11/02/2023  History of Present Illness  Pt is an 83 y/o M admitted on 11/02/23 for acute diarrhea, abdominal pain, emesis. Diagnosed with acute hepatitis. PMH: diverticulitis, prostate adenocarcinoma s/p XRT, HTN, HLD, gout, partial SBO (Feb 2025).  Clinical Impression  Prior to admission, patient lived with son in one level home, independent with ambulation/ADLs. Patient presents today with mild unsteadiness with mobility and reports of minimal R knee pain. Patient did report mild dizziness with ambulation however, vitals WNL. Patient will continue to benefit from skilled acute PT services to address the above impairments. Recommend discharge home with intermittent assist/supervision and no follow up PT.        If plan is discharge home, recommend the following: Help with stairs or ramp for entrance   Can travel by private vehicle        Equipment Recommendations None recommended by PT  Recommendations for Other Services       Functional Status Assessment       Precautions / Restrictions Precautions Precautions: Fall Recall of Precautions/Restrictions: Intact Restrictions Weight Bearing Restrictions Per Provider Order: No      Mobility  Bed Mobility Overal bed mobility: Modified Independent                  Transfers Overall transfer level: Needs assistance Equipment used: None Transfers: Sit to/from Stand, Bed to chair/wheelchair/BSC Sit to Stand: Contact guard assist   Step pivot transfers: Contact guard assist       General transfer comment: mild unsteadiness initially with STS transfers, reported mild dizziness    Ambulation/Gait Ambulation/Gait assistance: Contact guard assist Gait Distance (Feet): 65 Feet Assistive device: None Gait Pattern/deviations: Decreased weight shift to right, Step-through pattern       General Gait Details:  Limited to room secondary to precautions. No AD, mild unsteadiness but no formal LOB.  Stairs            Wheelchair Mobility     Tilt Bed    Modified Rankin (Stroke Patients Only)       Balance Overall balance assessment: Needs assistance Sitting-balance support: Feet unsupported, No upper extremity supported Sitting balance-Leahy Scale: Normal     Standing balance support: During functional activity Standing balance-Leahy Scale: Good Standing balance comment: Mild unsteadiness noted with gait, reduced gait speed, and shorter step length. No formal LOB                             Pertinent Vitals/Pain Pain Assessment Pain Assessment: 0-10 Pain Score: 2  Pain Location: R knee with ambulation Pain Descriptors / Indicators: Aching Pain Intervention(s): Monitored during session, Other (comment) (Patient declined needing pain medicine.)    Home Living Family/patient expects to be discharged to:: Private residence Living Arrangements: Children (son) Available Help at Discharge: Family;Available 24 hours/day Type of Home: House Home Access: Ramped entrance       Home Layout: One level Home Equipment: Cane - single point      Prior Function Prior Level of Function : Independent/Modified Independent             Mobility Comments: Patient reports no AD for ambulation and denies history of falls. ADLs Comments: indep ADL/IADLs     Extremity/Trunk Assessment        Lower Extremity Assessment Lower Extremity Assessment: Generalized weakness    Cervical / Trunk Assessment Cervical /  Trunk Assessment: Normal  Communication   Communication Communication: No apparent difficulties    Cognition Arousal: Alert Behavior During Therapy: WFL for tasks assessed/performed   PT - Cognitive impairments: No apparent impairments                         Following commands: Intact       Cueing Cueing Techniques: Verbal cues, Tactile cues      General Comments      Exercises     Assessment/Plan    PT Assessment Patient needs continued PT services  PT Problem List Decreased strength;Decreased activity tolerance;Decreased balance       PT Treatment Interventions DME instruction;Gait training;Balance training;Therapeutic activities;Therapeutic exercise;Patient/family education    PT Goals (Current goals can be found in the Care Plan section)  Acute Rehab PT Goals Patient Stated Goal: to return home PT Goal Formulation: With patient Time For Goal Achievement: 11/16/23 Potential to Achieve Goals: Good    Frequency Min 1X/week     Co-evaluation               AM-PAC PT 6 Clicks Mobility  Outcome Measure Help needed turning from your back to your side while in a flat bed without using bedrails?: None Help needed moving from lying on your back to sitting on the side of a flat bed without using bedrails?: None Help needed moving to and from a bed to a chair (including a wheelchair)?: A Little Help needed standing up from a chair using your arms (e.g., wheelchair or bedside chair)?: A Little Help needed to walk in hospital room?: A Little Help needed climbing 3-5 steps with a railing? : A Little 6 Click Score: 20    End of Session Equipment Utilized During Treatment: Gait belt Activity Tolerance: Patient tolerated treatment well Patient left: in chair;with chair alarm set;with call bell/phone within reach Nurse Communication: Mobility status PT Visit Diagnosis: Unsteadiness on feet (R26.81);Other abnormalities of gait and mobility (R26.89)    Time: 9054-8990 PT Time Calculation (min) (ACUTE ONLY): 24 min   Charges:   PT Evaluation $PT Eval Low Complexity: 1 Low PT Treatments $Therapeutic Activity: 8-22 mins PT General Charges $$ ACUTE PT VISIT: 1 Visit         [name], PT, DPT [facility] Acute Rehabilitation Office: [phone]   [name] 11/02/2023, 11:42 AM

## 2023-11-03 DIAGNOSIS — B179 Acute viral hepatitis, unspecified: Secondary | ICD-10-CM | POA: Diagnosis not present

## 2023-11-03 LAB — COMPREHENSIVE METABOLIC PANEL WITH GFR
ALT: 315 U/L — ABNORMAL HIGH (ref 0–44)
AST: 184 U/L — ABNORMAL HIGH (ref 15–41)
Albumin: 3.1 g/dL — ABNORMAL LOW (ref 3.5–5.0)
Alkaline Phosphatase: 131 U/L — ABNORMAL HIGH (ref 38–126)
Anion gap: 7 (ref 5–15)
BUN: 14 mg/dL (ref 8–23)
CO2: 24 mmol/L (ref 22–32)
Calcium: 8.8 mg/dL — ABNORMAL LOW (ref 8.9–10.3)
Chloride: 104 mmol/L (ref 98–111)
Creatinine, Ser: 1.07 mg/dL (ref 0.61–1.24)
GFR, Estimated: >60 mL/min (ref >60)
Glucose, Bld: 84 mg/dL (ref 70–99)
Potassium: 3.6 mmol/L (ref 3.5–5.1)
Sodium: 135 mmol/L (ref 135–145)
Total Bilirubin: 0.7 mg/dL (ref 0.0–1.2)
Total Protein: 6.3 g/dL — ABNORMAL LOW (ref 6.5–8.1)

## 2023-11-03 LAB — CBC
HCT: 33.6 % — ABNORMAL LOW (ref 39.0–52.0)
Hemoglobin: 11 g/dL — ABNORMAL LOW (ref 13.0–17.0)
MCH: 26.3 pg (ref 26.0–34.0)
MCHC: 32.7 g/dL (ref 30.0–36.0)
MCV: 80.4 fL (ref 80.0–100.0)
Platelets: 201 K/uL (ref 150–400)
RBC: 4.18 MIL/uL — ABNORMAL LOW (ref 4.22–5.81)
RDW: 15.7 % — ABNORMAL HIGH (ref 11.5–15.5)
WBC: 4.1 K/uL (ref 4.0–10.5)
nRBC: 0 % (ref 0.0–0.2)

## 2023-11-03 MED ORDER — CIPROFLOXACIN HCL 500 MG PO TABS
500.0000 mg | ORAL_TABLET | Freq: Two times a day (BID) | ORAL | Status: DC
  Administered 2023-11-03 – 2023-11-06 (×7): 500 mg via ORAL
  Filled 2023-11-03 (×7): qty 1

## 2023-11-03 MED ORDER — METRONIDAZOLE 500 MG PO TABS
500.0000 mg | ORAL_TABLET | Freq: Two times a day (BID) | ORAL | Status: DC
  Administered 2023-11-03 – 2023-11-06 (×7): 500 mg via ORAL
  Filled 2023-11-03 (×7): qty 1

## 2023-11-03 NOTE — Plan of Care (Signed)

## 2023-11-03 NOTE — Plan of Care (Signed)

## 2023-11-03 NOTE — Progress Notes (Signed)
  Progress Note   Patient: [name] FMW:[id number] DOB: [birth year] DOA: 10/31/2023     3 DOS: the patient was seen and examined on 11/03/2023 at 12:50PM      Brief hospital course: 83 y.o. M with HTN, HLD, history PE, prosCA, prior asthma, hx HSV, and hx internal hernia SBO last Feb presented with N/V and near syncope.    In the ER, hypotensive and sluggish and AST/ALT 2000/861, with Tbili 1.8. CT abdomen showed possible duodenitis, possible GB thickening, no other focal findings.  Admitted on IV fluids and antibiotics. GI consulted.      Assessment and plan: Hepatitis Diarrhea due to EPEC P shigelloides and astrovirus colitis Worse today, still having greater than 6 liquid bowel movements per day, feeling weak and dizzy this afternoon - Start Cipro  and Flagyl  - Resume IV fluids    AKI IV fluids overnight - Check BMP tomorrow              Subjective: The patient said frequent bowel movements overnight, the seem to be worsening, and he is still weak and dizzy with standing.  No vomiting, no hematochezia     Physical Exam: BP 113/64 (BP Location: Right Arm)   Pulse (!) 53   Temp 97.7 F (36.5 C) (Oral)   Resp 17   Ht 5' 11 (1.803 m)   Wt 77.6 kg   SpO2 100%   BMI 23.86 kg/m   Adult male, lying in bed, appears debilitated, weak and listless Slow, regular, no murmurs, no peripheral edema Abdomen soft, mild tenderness to palpation Attention normal, affect blunted, judgment and insight appear normal, upper extremity strength is weak but symmetric    Data Reviewed: Basic metabolic panel shows normal electrolytes and renal function CBC shows no leukocytosis, mild anemia     Family Communication:     Disposition: Status is: Inpatient         Author: Lonni SHAUNNA Dalton, MD 11/03/2023 6:11 PM  For on call review [email or web address].

## 2023-11-03 NOTE — Progress Notes (Signed)
 Mobility Specialist Progress Note:    11/03/23 1122  Mobility  Activity Ambulated with assistance (In hallway)  Level of Assistance Contact guard assist, steadying assist  Assistive Device None  Distance Ambulated (ft) 275 ft  Activity Response Tolerated well  Mobility Referral Yes  Mobility visit 1 Mobility  Mobility Specialist Start Time (ACUTE ONLY) [id number]  Mobility Specialist Stop Time (ACUTE ONLY) 1004  Mobility Specialist Time Calculation (min) (ACUTE ONLY) 12 min   Received pt in chair and agreeable to mobility. Pt required MinG for safety. Pt c/o RLE pain, otherwise tolerated well. Returned to room without fault. Left pt in chair with alarm on. Personal belongings and call light within reach. All needs met.  Lavanda Pollack Mobility Specialist  Please contact via [employer] or  Rehab Office [phone]'

## 2023-11-03 NOTE — Care Management Important Message (Signed)
 Important Message  Patient Details  Name: [name] MRN: [id number] Date of Birth: 02/11/1940   Important Message Given:  Yes - Medicare IM     Jon Cruel 11/03/2023, 12:42 PM

## 2023-11-04 DIAGNOSIS — B179 Acute viral hepatitis, unspecified: Secondary | ICD-10-CM | POA: Diagnosis not present

## 2023-11-04 LAB — CBC
HCT: 34.1 % — ABNORMAL LOW (ref 39.0–52.0)
Hemoglobin: 11.2 g/dL — ABNORMAL LOW (ref 13.0–17.0)
MCH: 26.5 pg (ref 26.0–34.0)
MCHC: 32.8 g/dL (ref 30.0–36.0)
MCV: 80.8 fL (ref 80.0–100.0)
Platelets: 236 K/uL (ref 150–400)
RBC: 4.22 MIL/uL (ref 4.22–5.81)
RDW: 15.9 % — ABNORMAL HIGH (ref 11.5–15.5)
WBC: 3.7 K/uL — ABNORMAL LOW (ref 4.0–10.5)
nRBC: 0 % (ref 0.0–0.2)

## 2023-11-04 LAB — COMPREHENSIVE METABOLIC PANEL WITH GFR
ALT: 243 U/L — ABNORMAL HIGH (ref 0–44)
AST: 132 U/L — ABNORMAL HIGH (ref 15–41)
Albumin: 2.9 g/dL — ABNORMAL LOW (ref 3.5–5.0)
Alkaline Phosphatase: 113 U/L (ref 38–126)
Anion gap: 7 (ref 5–15)
BUN: 14 mg/dL (ref 8–23)
CO2: 27 mmol/L (ref 22–32)
Calcium: 8.9 mg/dL (ref 8.9–10.3)
Chloride: 102 mmol/L (ref 98–111)
Creatinine, Ser: 1.14 mg/dL (ref 0.61–1.24)
GFR, Estimated: >60 mL/min (ref >60)
Glucose, Bld: 85 mg/dL (ref 70–99)
Potassium: 3.9 mmol/L (ref 3.5–5.1)
Sodium: 136 mmol/L (ref 135–145)
Total Bilirubin: 0.5 mg/dL (ref 0.0–1.2)
Total Protein: 6.1 g/dL — ABNORMAL LOW (ref 6.5–8.1)

## 2023-11-04 MED ORDER — ONDANSETRON HCL 4 MG/2ML IJ SOLN
4.0000 mg | Freq: Three times a day (TID) | INTRAMUSCULAR | Status: DC | PRN
  Administered 2023-11-04 – 2023-11-05 (×2): 4 mg via INTRAVENOUS
  Filled 2023-11-04 (×4): qty 2

## 2023-11-04 NOTE — Plan of Care (Signed)

## 2023-11-04 NOTE — Progress Notes (Signed)
 Physical Therapy Treatment Patient Details Name: [name] MRN: [id number] DOB: 1940/12/21 Today's Date: 11/04/2023   History of Present Illness Pt is an 83 y/o M admitted on 11/02/23 for acute diarrhea, abdominal pain, emesis. Diagnosed with acute hepatitis. PMH: diverticulitis, prostate adenocarcinoma s/p XRT, HTN, HLD, gout, partial SBO (Feb 2025).    PT Comments  Patient eager to get OOB and mobilize. Denied knee pain today, however with +abd discomfort. Demonstrated independence with bed mobility, transfers, and gait x 200 ft with no device. All PT goals met and pt discharged from PT.     If plan is discharge home, recommend the following:     Can travel by private vehicle        Equipment Recommendations  None recommended by PT    Recommendations for Other Services       Precautions / Restrictions Precautions Precautions: Fall Recall of Precautions/Restrictions: Intact Restrictions Weight Bearing Restrictions Per Provider Order: No     Mobility  Bed Mobility Overal bed mobility: Modified Independent                  Transfers Overall transfer level: Independent Equipment used: None Transfers: Sit to/from Stand Sit to Stand: Independent           General transfer comment: denied dizziness; no unsteadiness    Ambulation/Gait Ambulation/Gait assistance: Independent Gait Distance (Feet): 200 Feet Assistive device: None Gait Pattern/deviations: Step-through pattern, WFL(Within Functional Limits)   Gait velocity interpretation: 1.31 - 2.62 ft/sec, indicative of limited community ambulator   General Gait Details: denied knee pain; reports walking slower than usual due to abd discomfort   Stairs             Wheelchair Mobility     Tilt Bed    Modified Rankin (Stroke Patients Only)       Balance Overall balance assessment: Needs assistance Sitting-balance support: Feet unsupported, No upper extremity supported Sitting  balance-Leahy Scale: Normal     Standing balance support: During functional activity Standing balance-Leahy Scale: Good                              Communication Communication Communication: Impaired Factors Affecting Communication: Reduced clarity of speech;Hearing impaired (speaks at low volume; wearing hearing aids)  Cognition Arousal: Alert Behavior During Therapy: WFL for tasks assessed/performed   PT - Cognitive impairments: No apparent impairments                         Following commands: Intact      Cueing Cueing Techniques: Verbal cues, Tactile cues  Exercises      General Comments        Pertinent Vitals/Pain Pain Assessment Pain Assessment: Faces Faces Pain Scale: Hurts a little bit Pain Location: abd Pain Descriptors / Indicators: Discomfort Pain Intervention(s): Limited activity within patient's tolerance, Monitored during session    Home Living                          Prior Function            PT Goals (current goals can now be found in the care plan section) Acute Rehab PT Goals Patient Stated Goal: to return home PT Goal Formulation: With patient Time For Goal Achievement: 11/16/23 Potential to Achieve Goals: Good Progress towards PT goals: Goals met/education completed, patient discharged from PT  Frequency           PT Plan      Co-evaluation              AM-PAC PT 6 Clicks Mobility   Outcome Measure  Help needed turning from your back to your side while in a flat bed without using bedrails?: None Help needed moving from lying on your back to sitting on the side of a flat bed without using bedrails?: None Help needed moving to and from a bed to a chair (including a wheelchair)?: None Help needed standing up from a chair using your arms (e.g., wheelchair or bedside chair)?: None Help needed to walk in hospital room?: None Help needed climbing 3-5 steps with a railing? : None 6 Click  Score: 24    End of Session Equipment Utilized During Treatment: Gait belt Activity Tolerance: Patient tolerated treatment well Patient left: in chair;with chair alarm set;with call bell/phone within reach Nurse Communication: Mobility status PT Visit Diagnosis: Unsteadiness on feet (R26.81);Other abnormalities of gait and mobility (R26.89)   PT Discharge Note  Patient is being discharged from PT services secondary to:  Goals met and no further therapy needs identified.  Please see latest Therapy Progress Note for current level of functioning and progress toward goals.  Progress and discharge plan and discussed with patient/caregiver and they  Agree    Time: 9158-9148 PT Time Calculation (min) (ACUTE ONLY): 10 min  Charges:    $Gait Training: 8-22 mins PT General Charges $$ ACUTE PT VISIT: 1 Visit                      [name], PT Acute Rehabilitation Services  Office [phone]    [name] 11/04/2023, 8:55 AM

## 2023-11-04 NOTE — Plan of Care (Signed)
  Problem: Education: Goal: Knowledge of General Education information will improve Description: Including pain rating scale, medication(s)/side effects and non-pharmacologic comfort measures Outcome: Progressing   Problem: Activity: Goal: Risk for activity intolerance will decrease Outcome: Progressing   Problem: Coping: Goal: Level of anxiety will decrease Outcome: Progressing   Problem: Nutrition: Goal: Adequate nutrition will be maintained Outcome: Not Progressing

## 2023-11-04 NOTE — Progress Notes (Signed)
  Progress Note   Patient: [name] FMW:[id number] DOB: 04-27-1940 DOA: 10/31/2023     4 DOS: the patient was seen and examined on 11/04/2023 at 11:45AM      Brief hospital course: 83 y.o. M with HTN, HLD, history PE, prosCA, prior asthma, hx HSV, and hx internal hernia SBO last Feb presented with N/V and near syncope.    In the ER, hypotensive and sluggish and AST/ALT 2000/861, with Tbili 1.8. CT abdomen showed possible duodenitis, possible GB thickening, no other focal findings.  Admitted on IV fluids and antibiotics. GI consulted.      Assessment and plan: Ischemic hepatitis Initial presenting complaint.  GI consulted, viral hepatitis serologies negative, determined to be due to hypovolemia from colitis.  Now resolved.   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  GI panel positive for multiple species.  Initially on Rocephin /Flagyl , this was held after day 1, but given poor recovery, Abx restarted on 10/6, oral on purpose.  Tolerating well.  Stools slowing but still with pain, vomiting and dizziness. - Continue Cipro , Flagyl , day 3 of 5  Hypertension Not on medication at home  Hyperlipidemia Not on medication  Anemia Mild, no bleeding noted  AKI Baseline Cr 1.0, Cr here 1.4 on admission, now resolved.     Abnormal gallbladder on imaging Serial abdominal exams normal, no post-prandial pain.  In absence of RUQ pain, I have very low suspicion for cholectystitis at this time.                Subjective: Still dizzy today, vomited today.  No fever, no confusion.     Physical Exam: BP 127/69 (BP Location: Right Arm)   Pulse (!) 58   Temp 98.4 F (36.9 C) (Oral)   Resp 16   Ht 5' 11 (1.803 m)   Wt 77.6 kg   SpO2 100%   BMI 23.86 kg/m   Thin adult male, lying in bed, no acute distress RRR no mrumurs no LE edema Respiratory rate normal, lungs clear without rales or wheezes Abdomen soft, he has  diffuse tenderness to palpation, nothing focal, no guarding. Attention normal, affect normal, judgment and insight appear normal, generalized weakness but symmetric strength    Data Reviewed: Basic metabolic panel shows normal electrolytes and renal function CBC shows mild leukopenia, mild anemia, stable LFTs improving     Family Communication: Son at the bedside    Disposition: Status is: Inpatient 83 yo M admitted with E. coli, plesimonas and astrovirus colitis.  Given his age and ongoing dizziness and vomiting, I do not feel safe to discharge yet.  If stool solid and less rfrequent, oral intake good, and labs okay tomorrow, hopefully home tomorrow          Author: Lonni SHAUNNA Dalton, MD 11/04/2023 5:21 PM  For on call review [email or web address].

## 2023-11-05 DIAGNOSIS — B179 Acute viral hepatitis, unspecified: Secondary | ICD-10-CM | POA: Diagnosis not present

## 2023-11-05 LAB — COMPREHENSIVE METABOLIC PANEL WITH GFR
ALT: 232 U/L — ABNORMAL HIGH (ref 0–44)
AST: 187 U/L — ABNORMAL HIGH (ref 15–41)
Albumin: 3 g/dL — ABNORMAL LOW (ref 3.5–5.0)
Alkaline Phosphatase: 106 U/L (ref 38–126)
Anion gap: 9 (ref 5–15)
BUN: 11 mg/dL (ref 8–23)
CO2: 25 mmol/L (ref 22–32)
Calcium: 8.7 mg/dL — ABNORMAL LOW (ref 8.9–10.3)
Chloride: 98 mmol/L (ref 98–111)
Creatinine, Ser: 1.08 mg/dL (ref 0.61–1.24)
GFR, Estimated: >60 mL/min (ref >60)
Glucose, Bld: 76 mg/dL (ref 70–99)
Potassium: 3.9 mmol/L (ref 3.5–5.1)
Sodium: 132 mmol/L — ABNORMAL LOW (ref 135–145)
Total Bilirubin: 0.6 mg/dL (ref 0.0–1.2)
Total Protein: 6.1 g/dL — ABNORMAL LOW (ref 6.5–8.1)

## 2023-11-05 LAB — CBC
HCT: 32.5 % — ABNORMAL LOW (ref 39.0–52.0)
Hemoglobin: 10.8 g/dL — ABNORMAL LOW (ref 13.0–17.0)
MCH: 26.7 pg (ref 26.0–34.0)
MCHC: 33.2 g/dL (ref 30.0–36.0)
MCV: 80.2 fL (ref 80.0–100.0)
Platelets: 225 K/uL (ref 150–400)
RBC: 4.05 MIL/uL — ABNORMAL LOW (ref 4.22–5.81)
RDW: 15.5 % (ref 11.5–15.5)
WBC: 3.6 K/uL — ABNORMAL LOW (ref 4.0–10.5)
nRBC: 0 % (ref 0.0–0.2)

## 2023-11-05 LAB — CULTURE, BLOOD (ROUTINE X 2): Culture: NO GROWTH

## 2023-11-05 MED ORDER — INFLUENZA VAC SPLIT HIGH-DOSE 0.5 ML IM SUSY
0.5000 mL | PREFILLED_SYRINGE | INTRAMUSCULAR | Status: AC
  Administered 2023-11-06: 0.5 mL via INTRAMUSCULAR
  Filled 2023-11-05: qty 0.5

## 2023-11-05 NOTE — Plan of Care (Signed)
  Problem: Education: Goal: Knowledge of General Education information will improve Description: Including pain rating scale, medication(s)/side effects and non-pharmacologic comfort measures Outcome: Progressing   Problem: Clinical Measurements: Goal: Ability to maintain clinical measurements within normal limits will improve Outcome: Progressing   Problem: Coping: Goal: Level of anxiety will decrease Outcome: Progressing   

## 2023-11-05 NOTE — Plan of Care (Signed)
  Problem: Clinical Measurements: Goal: Ability to maintain clinical measurements within normal limits will improve Outcome: Progressing Goal: Diagnostic test results will improve Outcome: Progressing   Problem: Coping: Goal: Level of anxiety will decrease Outcome: Progressing   Problem: Activity: Goal: Risk for activity intolerance will decrease Outcome: Not Progressing   Problem: Nutrition: Goal: Adequate nutrition will be maintained Outcome: Not Progressing

## 2023-11-05 NOTE — Progress Notes (Signed)
 Patient did not eat any dinner. Was afraid of getting sick. He did take his antibiotics along with prn zofran . Has had no nausea or vomiting up to this point. Is resting with eyes closed at this time.

## 2023-11-05 NOTE — Progress Notes (Signed)
 Mobility Specialist Progress Note:   11/05/23 1011  Mobility  Activity Ambulated with assistance (In hallway)  Level of Assistance Modified independent, requires aide device or extra time  Assistive Device None  Distance Ambulated (ft) 200 ft  Activity Response Tolerated well  Mobility Referral Yes  Mobility visit 1 Mobility  Mobility Specialist Start Time (ACUTE ONLY) [id number]  Mobility Specialist Stop Time (ACUTE ONLY) [id number]  Mobility Specialist Time Calculation (min) (ACUTE ONLY) 10 min   Received pt in bed and agreeable to mobility. No physical assistance needed. No c/o. Returned to room without fault. Left pt in chair with alarm on. Personal belongings and call light within reach. All needs met.  [name] Mobility Specialist  Please contact via [employer] or  Rehab Office [phone]

## 2023-11-05 NOTE — Progress Notes (Signed)
 PROGRESS NOTE    [name]  FMW:[id number] DOB: 1940/09/02 DOA: 10/31/2023 PCP: [name], MD   Brief Narrative:  This 83 y.o. Male with HTN, HLD, history PE, prosCA, prior asthma, hx HSV, and hx internal hernia,  SBO last Feb presented with N/V and near syncope.  In the ER, found to be hypotensive and sluggish and AST/ALT 2000/861, with Tbili 1.8. CT abdomen showed possible duodenitis, possible GB thickening, no other focal findings. Admitted on IV fluids and antibiotics. GI consulted.    Assessment & Plan:   Principal Problem:   Acute hepatitis Active Problems:   History of hypertension, not currently requiring treatment   Malignant neoplasm of prostate (HCC) treated with radioactive seed implants (and XRT?)   Mild cognitive impairment of uncertain or unknown etiology   Hypoglycemia   Ischemic hepatitis: Initially presented with nausea and vomiting.   GI consulted, viral hepatitis serologies negative, determined to be due to hypovolemia from colitis.  Now resolved.     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  GI panel positive for multiple species. Initially on Rocephin /Flagyl , this was held after day 1, but given poor recovery, Abx restarted on 10/6, oral on purpose.  Tolerating well.  Stools slowing but still with pain, vomiting and dizziness. - Continue Cipro , Flagyl , day 3 of 5   Hypertension: Not on medication at home.   Hyperlipidemia: Not on medication   Anemia: Mild, no bleeding noted.   AKI: Baseline Cr 1.0, Cr here 1.4 on admission, now resolved.      Abnormal gallbladder on imaging: Serial abdominal exams normal, no post-prandial pain.   In absence of RUQ pain, We have very low suspicion for cholectystitis at this time.     DVT prophylaxis: Lovenox  Code Status: Full code Family Communication: No family at bed side Disposition Plan:   Status is: Inpatient Remains inpatient appropriate  because: Severity of illness.    Consultants:  GI  Procedures:  Antimicrobials:  Anti-infectives (From admission, onward)    Start     Dose/Rate Route Frequency Ordered Stop   11/03/23 1415  ciprofloxacin  (CIPRO ) tablet 500 mg        500 mg Oral Every 12 hours 11/03/23 1316     11/03/23 1415  metroNIDAZOLE  (FLAGYL ) tablet 500 mg        500 mg Oral Every 12 hours 11/03/23 1316     11/01/23 1800  cefTRIAXone  (ROCEPHIN ) 2 g in sodium chloride  0.9 % 100 mL IVPB  Status:  Discontinued        2 g 200 mL/hr over 30 Minutes Intravenous Every 24 hours 10/31/23 2202 11/01/23 1414   11/01/23 1000  metroNIDAZOLE  (FLAGYL ) IVPB 500 mg  Status:  Discontinued        500 mg 100 mL/hr over 60 Minutes Intravenous Every 12 hours 10/31/23 2202 11/01/23 1414   10/31/23 1800  cefTRIAXone  (ROCEPHIN ) 2 g in sodium chloride  0.9 % 100 mL IVPB        2 g 200 mL/hr over 30 Minutes Intravenous Once 10/31/23 1745 10/31/23 1900   10/31/23 1800  metroNIDAZOLE  (FLAGYL ) IVPB 500 mg        500 mg 100 mL/hr over 60 Minutes Intravenous  Once 10/31/23 1745 10/31/23 2126      Subjective: Patient was seen and examined at bedside.  Overnight events noted.   Patient reports feeling better,  abdominal pain is improving,  He has no bowel movement in two days,  asking for stool softeners.  Objective: Vitals:   11/04/23 1722 11/04/23 2137 11/05/23 0518 11/05/23 0929  BP: 137/80 (!) 146/83 121/71 129/65  Pulse: (!) 53 (!) 58 (!) 47 (!) 52  Resp: 15 16 16 17   Temp: 98.1 F (36.7 C) 97.9 F (36.6 C) (!) 97.5 F (36.4 C) 98.1 F (36.7 C)  TempSrc: Oral Oral Oral Oral  SpO2: 100% 100% 100% 100%  Weight:      Height:        Intake/Output Summary (Last 24 hours) at 11/05/2023 1318 Last data filed at 11/05/2023 0900 Gross per 24 hour  Intake 840 ml  Output 2150 ml  Net -1310 ml   Filed Weights   10/31/23 2127 10/31/23 2233  Weight: 75.8 kg 77.6 kg    Examination:  General exam: Appears calm and comfortable ,  deconditioned, weak, not in any distress Respiratory system: Clear to auscultation. Respiratory effort normal.  RR 12 Cardiovascular system: S1 & S2 heard, RRR. No JVD, murmurs, rubs, gallops or clicks. Gastrointestinal system: Abdomen is non distended, soft and non tender. Normal bowel sounds heard. Central nervous system: Alert and oriented x 2. No focal neurological deficits. Extremities: No edema, no cyanosis, no clubbing Skin: No rashes, lesions or ulcers Psychiatry: Judgement and insight appear normal. Mood & affect appropriate.     Data Reviewed: I have personally reviewed following labs and imaging studies  CBC: Recent Labs  Lab 10/31/23 1600 11/01/23 1351 11/02/23 0334 11/03/23 1100 11/04/23 0610 11/05/23 0437  WBC 3.8* 6.5 5.1 4.1 3.7* 3.6*  NEUTROABS 3.1  --   --   --   --   --   HGB 11.6* 10.3* 10.4* 11.0* 11.2* 10.8*  HCT 35.5* 31.2* 30.9* 33.6* 34.1* 32.5*  MCV 82.2 80.4 79.2* 80.4 80.8 80.2  PLT 197 207 209 201 236 225   Basic Metabolic Panel: Recent Labs  Lab 11/01/23 1027 11/02/23 0334 11/03/23 1100 11/04/23 0610 11/05/23 0437  NA 137 139 135 136 132*  K 3.8 3.6 3.6 3.9 3.9  CL 105 105 104 102 98  CO2 21* 25 24 27 25   GLUCOSE 94 94 84 85 76  BUN 12 13 14 14 11   CREATININE 1.23 1.16 1.07 1.14 1.08  CALCIUM 9.0 8.9 8.8* 8.9 8.7*   GFR: Estimated Creatinine Clearance: 56.2 mL/min (by C-G formula based on SCr of 1.08 mg/dL). Liver Function Tests: Recent Labs  Lab 11/01/23 1027 11/02/23 0334 11/03/23 1100 11/04/23 0610 11/05/23 0437  AST 830* 466* 184* 132* 187*  ALT 675* 478* 315* 243* 232*  ALKPHOS 156* 139* 131* 113 106  BILITOT 1.7* 0.9 0.7 0.5 0.6  PROT 6.4* 5.9* 6.3* 6.1* 6.1*  ALBUMIN 3.1* 2.9* 3.1* 2.9* 3.0*   Recent Labs  Lab 10/31/23 1217  LIPASE 59*   No results for input(s): AMMONIA in the last 168 hours. Coagulation Profile: Recent Labs  Lab 10/31/23 1217 11/01/23 1027  INR 1.2 1.6*   Cardiac Enzymes: Recent Labs   Lab 11/01/23 1027 11/02/23 0334  CKTOTAL 1,694* 743*  CKMB 7.5*  --    BNP (last 3 results) No results for input(s): PROBNP in the last 8760 hours. HbA1C: No results for input(s): HGBA1C in the last 72 hours. CBG: No results for input(s): GLUCAP in the last 168 hours. Lipid Profile: No results for input(s): CHOL, HDL, LDLCALC, TRIG, CHOLHDL, LDLDIRECT in the last 72 hours. Thyroid Function Tests: No results for input(s): TSH, T4TOTAL, [name]4, T3FREE, THYROIDAB in the last 72 hours. Anemia  Panel: No results for input(s): VITAMINB12, FOLATE, FERRITIN, TIBC, IRON, RETICCTPCT in the last 72 hours. Sepsis Labs: Recent Labs  Lab 10/31/23 1229 10/31/23 1342  LATICACIDVEN 3.0* 2.1*    Recent Results (from the past 240 hours)  Blood Culture (routine x 2)     Status: None   Collection Time: 10/31/23 12:41 PM   Specimen: BLOOD  Result Value Ref Range Status   Specimen Description BLOOD LEFT ANTECUBITAL  Final   Special Requests   Final    BOTTLES DRAWN AEROBIC ONLY Blood Culture results may not be optimal due to an inadequate volume of blood received in culture bottles   Culture   Final    NO GROWTH 5 DAYS Performed at [facility], [address] [place], [place] [address]    Report Status 11/05/2023 FINAL  Final  Gastrointestinal Panel by PCR , Stool     Status: Abnormal   Collection Time: 10/31/23  3:23 PM   Specimen: Rectum; Stool  Result Value Ref Range Status   Campylobacter species NOT DETECTED NOT DETECTED Final   Plesimonas shigelloides DETECTED (A) NOT DETECTED Final    Comment: RESULT CALLED TO, READ BACK BY AND VERIFIED WITH: DAINA DASEN., RN AT 1413 11/01/23 RAM    Salmonella species NOT DETECTED NOT DETECTED Final   Yersinia enterocolitica NOT DETECTED NOT DETECTED Final   Vibrio species NOT DETECTED NOT DETECTED Final   Vibrio cholerae NOT DETECTED NOT DETECTED Final   Enteroaggregative E coli (EAEC) NOT DETECTED NOT  DETECTED Final   Enteropathogenic E coli (EPEC) DETECTED (A) NOT DETECTED Final    Comment: RESULT CALLED TO, READ BACK BY AND VERIFIED WITH: DAINA DASEN., RN AT [phone] 11/01/23 RAM    Enterotoxigenic E coli (ETEC) NOT DETECTED NOT DETECTED Final   Shiga like toxin producing E coli (STEC) NOT DETECTED NOT DETECTED Final   Shigella/Enteroinvasive E coli (EIEC) NOT DETECTED NOT DETECTED Final   Cryptosporidium NOT DETECTED NOT DETECTED Final   Cyclospora cayetanensis NOT DETECTED NOT DETECTED Final   Entamoeba histolytica NOT DETECTED NOT DETECTED Final   Giardia lamblia NOT DETECTED NOT DETECTED Final   Adenovirus F40/41 NOT DETECTED NOT DETECTED Final   Astrovirus DETECTED (A) NOT DETECTED Final   Norovirus GI/GII NOT DETECTED NOT DETECTED Final   Rotavirus A NOT DETECTED NOT DETECTED Final   Sapovirus (I, II, IV, and V) NOT DETECTED NOT DETECTED Final    Comment: Performed at [facility], [address] [place], [place] [address]  C Difficile Quick Screen w PCR reflex     Status: None   Collection Time: 10/31/23  3:23 PM   Specimen: Rectum; Stool  Result Value Ref Range Status   C Diff antigen NEGATIVE NEGATIVE Final   C Diff toxin NEGATIVE NEGATIVE Final   C Diff interpretation No C. difficile detected.  Final    Comment: Performed at [facility], [address], [place], [place] [address]  Resp panel by RT-PCR (RSV, Flu A&B, Covid) Anterior Nasal Swab     Status: None   Collection Time: 10/31/23  7:40 PM   Specimen: Anterior Nasal Swab  Result Value Ref Range Status   SARS Coronavirus 2 by RT PCR NEGATIVE NEGATIVE Final   Influenza A by PCR NEGATIVE NEGATIVE Final   Influenza B by PCR NEGATIVE NEGATIVE Final    Comment: (NOTE) The Xpert Xpress SARS-CoV-2/FLU/RSV plus assay is intended as an aid in the diagnosis of influenza from Nasopharyngeal swab specimens  and should not be used as a sole basis for treatment. Nasal washings and aspirates are unacceptable for  Xpert Xpress SARS-CoV-2/FLU/RSV testing.  Fact Sheet for Patients: BloggerCourse.com  Fact Sheet for Healthcare Providers: SeriousBroker.it  This test is not yet approved or cleared by the [place]  FDA and has been authorized for detection and/or diagnosis of SARS-CoV-2 by FDA under an Emergency Use Authorization (EUA). This EUA will remain in effect (meaning this test can be used) for the duration of the COVID-19 declaration under Section 564(b)(1) of the Act, 21 U.S.C. section 360bbb-3(b)(1), unless the authorization is terminated or revoked.     Resp Syncytial Virus by PCR NEGATIVE NEGATIVE Final    Comment: (NOTE) Fact Sheet for Patients: BloggerCourse.com  Fact Sheet for Healthcare Providers: SeriousBroker.it  This test is not yet approved or cleared by the [place]  FDA and has been authorized for detection and/or diagnosis of SARS-CoV-2 by FDA under an Emergency Use Authorization (EUA). This EUA will remain in effect (meaning this test can be used) for the duration of the COVID-19 declaration under Section 564(b)(1) of the Act, 21 U.S.C. section 360bbb-3(b)(1), unless the authorization is terminated or revoked.  Performed at [facility], [address] [place], [place] [address]   Culture, blood (Routine X 2) w Reflex to ID Panel     Status: None (Preliminary result)   Collection Time: 11/01/23 10:27 AM   Specimen: BLOOD LEFT ARM  Result Value Ref Range Status   Specimen Description BLOOD LEFT ARM  Final   Special Requests   Final    BOTTLES DRAWN AEROBIC AND ANAEROBIC Blood Culture results may not be optimal due to an inadequate volume of blood received in culture bottles   Culture   Final    NO GROWTH 4 DAYS Performed at [facility], [address] [place], [place] [address]    Report Status PENDING  Incomplete    Radiology Studies: No  results found.  Scheduled Meds:  ciprofloxacin   500 mg Oral Q12H   enoxaparin  (LOVENOX ) injection  40 mg Subcutaneous Q24H   [START ON 11/06/2023] Influenza vac split trivalent PF  0.5 mL Intramuscular Tomorrow-1000   metroNIDAZOLE   500 mg Oral Q12H   Continuous Infusions:   LOS: 5 days    Time spent: 50 mins    Darcel Dawley, MD [facility]   If 7PM-7AM, please contact night-coverage

## 2023-11-06 DIAGNOSIS — B179 Acute viral hepatitis, unspecified: Secondary | ICD-10-CM | POA: Diagnosis not present

## 2023-11-06 LAB — CULTURE, BLOOD (ROUTINE X 2): Culture: NO GROWTH

## 2023-11-06 LAB — HEPATIC FUNCTION PANEL
ALT: 215 U/L — ABNORMAL HIGH (ref 0–44)
AST: 177 U/L — ABNORMAL HIGH (ref 15–41)
Albumin: 3 g/dL — ABNORMAL LOW (ref 3.5–5.0)
Alkaline Phosphatase: 105 U/L (ref 38–126)
Bilirubin, Direct: 0.2 mg/dL (ref 0.0–0.2)
Indirect Bilirubin: 0.4 mg/dL (ref 0.3–0.9)
Total Bilirubin: 0.6 mg/dL (ref 0.0–1.2)
Total Protein: 6.2 g/dL — ABNORMAL LOW (ref 6.5–8.1)

## 2023-11-06 MED ORDER — CIPROFLOXACIN HCL 500 MG PO TABS: 500.0000 mg | ORAL_TABLET | Freq: Two times a day (BID) | ORAL | 0 refills | Status: AC

## 2023-11-06 MED ORDER — TRAMADOL HCL 50 MG PO TABS: 50.0000 mg | ORAL_TABLET | Freq: Four times a day (QID) | ORAL | 0 refills | Status: AC | PRN

## 2023-11-06 MED ORDER — METRONIDAZOLE 500 MG PO TABS: 500.0000 mg | ORAL_TABLET | Freq: Two times a day (BID) | ORAL | 0 refills | Status: AC

## 2023-11-06 NOTE — Progress Notes (Signed)
 Explained discharge instructions to patient. Reviewed follow up appointment and next medication administration times. Also reviewed education. Patient verbalized having an understanding for instructions given. All belongings are in the patient's possessio. IV  removed. Flu vaccine administered prior to discharging in left deltoid.  No other needs verbalized. Transported downstairs for discharge.

## 2023-11-06 NOTE — Progress Notes (Signed)
 Mobility Specialist Progress Note:    11/06/23 1048  Mobility  Activity Ambulated with assistance (In hallway)  Level of Assistance Modified independent, requires aide device or extra time  Assistive Device None  Distance Ambulated (ft) 150 ft  Activity Response Tolerated well  Mobility Referral Yes  Mobility visit 1 Mobility  Mobility Specialist Start Time (ACUTE ONLY) 1032  Mobility Specialist Stop Time (ACUTE ONLY) 1042  Mobility Specialist Time Calculation (min) (ACUTE ONLY) 10 min   Received pt in bed and agreeable to mobility. No physical assistance needed. C/o nausea, otherwise tolerated well. Returned to room without fault. Left pt in chair with alarm on. Personal belongings and call light within reach. All needs met.   Lavanda Pollack Mobility Specialist  Please contact via [employer] or  Rehab Office [phone]

## 2023-11-06 NOTE — Plan of Care (Signed)
  Problem: Education: Goal: Knowledge of General Education information will improve Description: Including pain rating scale, medication(s)/side effects and non-pharmacologic comfort measures Outcome: Adequate for Discharge   Problem: Health Behavior/Discharge Planning: Goal: Ability to manage health-related needs will improve Outcome: Adequate for Discharge   Problem: Clinical Measurements: Goal: Ability to maintain clinical measurements within normal limits will improve Outcome: Adequate for Discharge Goal: Will remain free from infection Outcome: Adequate for Discharge Goal: Diagnostic test results will improve Outcome: Adequate for Discharge Goal: Respiratory complications will improve Outcome: Adequate for Discharge Goal: Cardiovascular complication will be avoided Outcome: Adequate for Discharge   Problem: Activity: Goal: Risk for activity intolerance will decrease Outcome: Adequate for Discharge   Problem: Nutrition: Goal: Adequate nutrition will be maintained Outcome: Adequate for Discharge   Problem: Coping: Goal: Level of anxiety will decrease Outcome: Adequate for Discharge   Problem: Elimination: Goal: Will not experience complications related to bowel motility Outcome: Adequate for Discharge Goal: Will not experience complications related to urinary retention Outcome: Adequate for Discharge   Problem: Pain Managment: Goal: General experience of comfort will improve and/or be controlled Outcome: Adequate for Discharge   Problem: Skin Integrity: Goal: Risk for impaired skin integrity will decrease Outcome: Adequate for Discharge   Problem: Fluid Volume: Goal: Hemodynamic stability will improve Outcome: Adequate for Discharge   Problem: Clinical Measurements: Goal: Diagnostic test results will improve Outcome: Adequate for Discharge Goal: Signs and symptoms of infection will decrease Outcome: Adequate for Discharge   Problem: Respiratory: Goal:  Ability to maintain adequate ventilation will improve Outcome: Adequate for Discharge   Problem: Acute Rehab PT Goals(only PT should resolve) Goal: Patient Will Transfer Sit To/From Stand Outcome: Adequate for Discharge Goal: Pt Will Ambulate Outcome: Adequate for Discharge Goal: PT Additional Goal #1 Outcome: Adequate for Discharge

## 2023-11-06 NOTE — Discharge Summary (Signed)
 Physician Discharge Summary  [name] FMW:[id number] DOB: [birth year] DOA: 10/31/2023  PCP: [name] Mliss Dragon, MD  Admit date: 10/31/2023  Discharge date: 11/06/2023  Admitted From: Home  Disposition:  Home  Recommendations for Outpatient Follow-up:   Please obtain CMP/CBC in one week. Advised to take cipro  500 bid and flagyl  500 bid for 2 more days. Advised to drink more fluids. Advised to follow up gastroenterology as scheduled. Follow-up with PCP as scheduled.  Home Health:None Equipment/Devices:None  Discharge Condition: Stable CODE STATUS:Full code Diet recommendation: Heart Healthy   Brief Summary / Hospital Course: This 83 y.o. Male with HTN, HLD, history PE, prosCA, prior asthma, hx HSV, and hx internal hernia,  SBO last Feb presented with N/V and near syncope.  In the ER, found to be hypotensive and sluggish and AST/ALT 2000/861, with Tbili 1.8. CT abdomen showed possible duodenitis, possible GB thickening, no other focal findings. Admitted on IV fluids and antibiotics. GI consulted.  Patient has diarrhea due to EPEC P shigelloides and astrovirus colitis.  Which is now resolved. Patient continued on Cipro  and Flagyl  for total 5 days.  Patient feels much better and wants to be discharged home.   Discharge Diagnoses:  Principal Problem:   Acute hepatitis Active Problems:   History of hypertension, not currently requiring treatment   Malignant neoplasm of prostate (HCC) treated with radioactive seed implants (and XRT?)   Mild cognitive impairment of uncertain or unknown etiology   Hypoglycemia  Ischemic hepatitis: Initially presented with nausea and vomiting.   GI consulted, viral hepatitis serologies negative, determined to be due to hypovolemia from colitis.  Now resolved.     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  GI panel positive for multiple species. Initially on Rocephin /Flagyl , this was held after  day 1, but given poor recovery, Abx restarted on 10/6, oral on purpose.  Tolerating well.  Stools slowing but still with pain, vomiting and dizziness. - Continue Cipro , Flagyl , day 3 of 5.   Hypertension: Not on medication at home.   Hyperlipidemia: Not on medication   Anemia: Mild, no bleeding noted.   AKI: Baseline Cr 1.0, Cr here 1.4 on admission, now resolved.      Abnormal gallbladder on imaging: Serial abdominal exams normal, no post-prandial pain.   In absence of RUQ pain, We have very low suspicion for cholectystitis at this time.    Discharge Instructions  Discharge Instructions     Call MD for:  difficulty breathing, headache or visual disturbances   Complete by: As directed    Call MD for:  persistant dizziness or light-headedness   Complete by: As directed    Call MD for:  persistant nausea and vomiting   Complete by: As directed    Diet - low sodium heart healthy   Complete by: As directed    Diet general   Complete by: As directed    Discharge instructions   Complete by: As directed    Advised to follow up PCP in one week. Advised to take cipro  500bid and flagyl  500 bid for 2 more days. Advised to drink more fluids   Increase activity slowly   Complete by: As directed       Allergies as of 11/06/2023   No Known Allergies      Medication List     TAKE these medications    ciprofloxacin  500 MG tablet Commonly known as: CIPRO  Take 1 tablet (500 mg total) by mouth every  12 (twelve) hours for 2 days.   metroNIDAZOLE  500 MG tablet Commonly known as: FLAGYL  Take 1 tablet (500 mg total) by mouth every 12 (twelve) hours for 2 days.   traMADol 50 MG tablet Commonly known as: ULTRAM Take 1 tablet (50 mg total) by mouth every 6 (six) hours as needed for up to 3 days for moderate pain (pain score 4-6).        Follow-up Information     [name] Estefana DEL, DO. Schedule an appointment as soon as possible for a visit in 2 week(s).   Specialty:  Gastroenterology Why: Hospital follow up, elevated liver enzymes. Contact information: [address] [place] [address] [phone]         [name] Mliss Dragon, MD Follow up in 1 week(s).   Specialty: Internal Medicine Contact information: [address] [place], [address] [place] [address] [phone]                No Known Allergies  Consultations: Gastroenterology   Procedures/Studies: US  Abdomen Limited RUQ (LIVER/GB) Result Date: 10/31/2023 CLINICAL DATA:  221910 Elevated LFTs 221910 EXAM: ULTRASOUND ABDOMEN LIMITED RIGHT UPPER QUADRANT COMPARISON:  None Available. FINDINGS: Gallbladder: No gallstones. Mild wall thickening along the hepatic surface of the gallbladder with trace pericholecystic fluid. No sonographic Murphy's sign noted by sonographer. Common bile duct: Diameter: 4 mm Liver: Normal echogenicity. No focal lesion identified. No intrahepatic biliary ductal dilation. Portal vein is patent on color Doppler imaging with normal direction of blood flow towards the liver. Right Kidney: Partially visualized. Partially visualized right renal cyst again noted. No hydronephrosis or nephrolithiasis. Other: None. IMPRESSION: Mild gallbladder wall thickening along the hepatic aspect of the gallbladder with trace pericholecystic fluid. In the absence of a sonographic Murphy's sign and gallstones, this may be due to upper abdominal inflammation, acute hepatitis, or volume overload from either CHF or renal failure. Laboratory correlation recommended. Electronically Signed   By: Rogelia Myers M.D.   On: 10/31/2023 19:36   CT ABDOMEN PELVIS W CONTRAST Result Date: 10/31/2023 CLINICAL DATA:  Acute abdominal pain. EXAM: CT ABDOMEN AND PELVIS WITH CONTRAST TECHNIQUE: Multidetector CT imaging of the abdomen and pelvis was performed using the standard protocol following bolus administration of intravenous contrast. RADIATION DOSE REDUCTION: This exam was performed  according to the departmental dose-optimization program which includes automated exposure control, adjustment of the mA and/or kV according to patient size and/or use of iterative reconstruction technique. CONTRAST:  75mL OMNIPAQUE  IOHEXOL  350 MG/ML SOLN COMPARISON:  Most recent comparison CT 09/18/2023 FINDINGS: Lower chest: Hepatobiliary: Subsegmental atelectasis in the dependent lungs with trace right pleural thickening. The heart is normal in size. Pancreas: Punctate granuloma in the liver. No focal liver lesion. The gallbladder is moderately distended. Gallbladder wall thickening versus pericholecystic fluid, series 6, image 56. No calcified gallstone. Pericholecystic edema tracks inferiorly in the anterior pararenal space no biliary dilatation. Spleen: Normal in size without focal abnormality. Adrenals/Urinary Tract: Normal adrenal glands. Stable bilateral renal cysts. No further follow-up imaging is recommended. No hydronephrosis or renal inflammation. Partially distended urinary bladder. Mild wall thickening inferiorly. Stomach/Bowel: Fluid-filled stomach and proximal small bowel. No abnormal small bowel distension. Duodenal diverticulum. Stranding about the third portion of the duodenum is likely reactive; no definite wall thickening. The appendix is normal. Small to moderate colonic stool burden. Left colonic diverticulosis, prominent in the descending and sigmoid colon. Sigmoid is redundant. Vascular/Lymphatic: Aortic and branch atherosclerosis. No aortic aneurysm. Portal vein is patent. No enlarged lymph  nodes in the abdomen or pelvis. Reproductive: TURP defect in the prostate. Probable urolith. Retractile testis suspected in the inguinal canals. Other: Free fluid in the right upper quadrant with inflammatory fat stranding. No free air. No discrete fluid collection. Small bilateral fat containing inguinal hernias. Musculoskeletal: There are no acute or suspicious osseous abnormalities. Scattered  degenerative change in Schmorl's nodes in the spine. Bilateral hip osteoarthritis. IMPRESSION: 1. Moderately distended gallbladder with gallbladder wall thickening versus pericholecystic fluid. No calcified gallstone. Findings may represent for acute cholecystitis. Recommend right upper quadrant ultrasound for further evaluation. 2. Edema about the third portion of the duodenum is felt to be reactive due to gallbladder inflammation, duodenitis is felt less likely, but not entirely excluded. 3. Colonic diverticulosis without diverticulitis. 4. Mild wall thickening of the inferior urinary bladder, may be due to chronic bladder outlet obstruction or cystitis. Aortic Atherosclerosis (ICD10-I70.0). Electronically Signed   By: Andrea Gasman M.D.   On: 10/31/2023 17:28   DG Chest Port 1 View Result Date: 10/31/2023 CLINICAL DATA:  Weakness, diarrhea. EXAM: PORTABLE CHEST 1 VIEW COMPARISON:  October 11, 2021. FINDINGS: The heart size and mediastinal contours are within normal limits. Both lungs are clear. The visualized skeletal structures are unremarkable. IMPRESSION: No active disease. Electronically Signed   By: Lynwood Landy Raddle M.D.   On: 10/31/2023 11:50     Subjective: Patient was seen and examined at bedside.  Overnight events noted. Patient reports feeling much better and wants to be discharged home.  Abdominal pain has resolved.  Discharge Exam: Vitals:   11/06/23 0916 11/06/23 0948  BP: 106/78 107/61  Pulse: 68 (!) 59  Resp: 16 17  Temp: 98.2 F (36.8 C) 98 F (36.7 C)  SpO2: 98% 100%   Vitals:   11/05/23 2100 11/06/23 0425 11/06/23 0916 11/06/23 0948  BP: 133/69 135/70 106/78 107/61  Pulse: (!) 52 (!) 48 68 (!) 59  Resp: 16 16 16 17   Temp: 98.1 F (36.7 C) 97.9 F (36.6 C) 98.2 F (36.8 C) 98 F (36.7 C)  TempSrc: Oral Oral Oral Oral  SpO2: 100% 98% 98% 100%  Weight:      Height:        General: Pt is alert, awake, not in acute distress Cardiovascular: RRR, S1/S2 +, no  rubs, no gallops Respiratory: CTA bilaterally, no wheezing, no rhonchi Abdominal: Soft, NT, ND, bowel sounds + Extremities: no edema, no cyanosis    The results of significant diagnostics from this hospitalization (including imaging, microbiology, ancillary and laboratory) are listed below for reference.     Microbiology: Recent Results (from the past 240 hours)  Blood Culture (routine x 2)     Status: None   Collection Time: 10/31/23 12:41 PM   Specimen: BLOOD  Result Value Ref Range Status   Specimen Description BLOOD LEFT ANTECUBITAL  Final   Special Requests   Final    BOTTLES DRAWN AEROBIC ONLY Blood Culture results may not be optimal due to an inadequate volume of blood received in culture bottles   Culture   Final    NO GROWTH 5 DAYS Performed at [facility], [address], [place], [place] [address]    Report Status 11/05/2023 FINAL  Final  Gastrointestinal Panel by PCR , Stool     Status: Abnormal   Collection Time: 10/31/23  3:23 PM   Specimen: Rectum; Stool  Result Value Ref Range Status   Campylobacter species NOT DETECTED NOT DETECTED Final   Plesimonas shigelloides DETECTED (  A) NOT DETECTED Final    Comment: RESULT CALLED TO, READ BACK BY AND VERIFIED WITH: SHIANN T., RN AT 1413 11/01/23 RAM    Salmonella species NOT DETECTED NOT DETECTED Final   Yersinia enterocolitica NOT DETECTED NOT DETECTED Final   Vibrio species NOT DETECTED NOT DETECTED Final   Vibrio cholerae NOT DETECTED NOT DETECTED Final   Enteroaggregative E coli (EAEC) NOT DETECTED NOT DETECTED Final   Enteropathogenic E coli (EPEC) DETECTED (A) NOT DETECTED Final    Comment: RESULT CALLED TO, READ BACK BY AND VERIFIED WITH: DAINA DASEN., RN AT 1413 11/01/23 RAM    Enterotoxigenic E coli (ETEC) NOT DETECTED NOT DETECTED Final   Shiga like toxin producing E coli (STEC) NOT DETECTED NOT DETECTED Final   Shigella/Enteroinvasive E coli (EIEC) NOT DETECTED NOT DETECTED Final   Cryptosporidium NOT  DETECTED NOT DETECTED Final   Cyclospora cayetanensis NOT DETECTED NOT DETECTED Final   Entamoeba histolytica NOT DETECTED NOT DETECTED Final   Giardia lamblia NOT DETECTED NOT DETECTED Final   Adenovirus F40/41 NOT DETECTED NOT DETECTED Final   Astrovirus DETECTED (A) NOT DETECTED Final   Norovirus GI/GII NOT DETECTED NOT DETECTED Final   Rotavirus A NOT DETECTED NOT DETECTED Final   Sapovirus (I, II, IV, and V) NOT DETECTED NOT DETECTED Final    Comment: Performed at [facility], [address] [place], [place] [address]  C Difficile Quick Screen w PCR reflex     Status: None   Collection Time: 10/31/23  3:23 PM   Specimen: Rectum; Stool  Result Value Ref Range Status   C Diff antigen NEGATIVE NEGATIVE Final   C Diff toxin NEGATIVE NEGATIVE Final   C Diff interpretation No C. difficile detected.  Final    Comment: Performed at [facility], [address] [place], [place] [address]  Resp panel by RT-PCR (RSV, Flu A&B, Covid) Anterior Nasal Swab     Status: None   Collection Time: 10/31/23  7:40 PM   Specimen: Anterior Nasal Swab  Result Value Ref Range Status   SARS Coronavirus 2 by RT PCR NEGATIVE NEGATIVE Final   Influenza A by PCR NEGATIVE NEGATIVE Final   Influenza B by PCR NEGATIVE NEGATIVE Final    Comment: (NOTE) The Xpert Xpress SARS-CoV-2/FLU/RSV plus assay is intended as an aid in the diagnosis of influenza from Nasopharyngeal swab specimens and should not be used as a sole basis for treatment. Nasal washings and aspirates are unacceptable for Xpert Xpress SARS-CoV-2/FLU/RSV testing.  Fact Sheet for Patients: BloggerCourse.com  Fact Sheet for Healthcare Providers: SeriousBroker.it  This test is not yet approved or cleared by the [place]  FDA and has been authorized for detection and/or diagnosis of SARS-CoV-2 by FDA under an Emergency Use Authorization (EUA). This EUA will remain in effect  (meaning this test can be used) for the duration of the COVID-19 declaration under Section 564(b)(1) of the Act, 21 U.S.C. section 360bbb-3(b)(1), unless the authorization is terminated or revoked.     Resp Syncytial Virus by PCR NEGATIVE NEGATIVE Final    Comment: (NOTE) Fact Sheet for Patients: BloggerCourse.com  Fact Sheet for Healthcare Providers: SeriousBroker.it  This test is not yet approved or cleared by the [place]  FDA and has been authorized for detection and/or diagnosis of SARS-CoV-2 by FDA under an Emergency Use Authorization (EUA). This EUA will remain in effect (meaning this test can be used) for the duration of the COVID-19 declaration under Section 564(b)(1) of the Act, 21  U.S.C. section 360bbb-3(b)(1), unless the authorization is terminated or revoked.  Performed at [facility], [address] [place], [place] [address]   Culture, blood (Routine X 2) w Reflex to ID Panel     Status: None   Collection Time: 11/01/23 10:27 AM   Specimen: BLOOD LEFT ARM  Result Value Ref Range Status   Specimen Description BLOOD LEFT ARM  Final   Special Requests   Final    BOTTLES DRAWN AEROBIC AND ANAEROBIC Blood Culture results may not be optimal due to an inadequate volume of blood received in culture bottles   Culture   Final    NO GROWTH 5 DAYS Performed at [facility], [address], [place], [place] [address]    Report Status 11/06/2023 FINAL  Final     Labs: BNP (last 3 results) No results for input(s): BNP in the last 8760 hours. Basic Metabolic Panel: Recent Labs  Lab 11/01/23 1027 11/02/23 0334 11/03/23 1100 11/04/23 0610 11/05/23 0437  NA 137 139 135 136 132*  K 3.8 3.6 3.6 3.9 3.9  CL 105 105 104 102 98  CO2 21* 25 24 27 25   GLUCOSE 94 94 84 85 76  BUN 12 13 14 14 11   CREATININE 1.23 1.16 1.07 1.14 1.08  CALCIUM 9.0 8.9 8.8* 8.9 8.7*   Liver Function Tests: Recent Labs   Lab 11/02/23 0334 11/03/23 1100 11/04/23 0610 11/05/23 0437 11/06/23 0441  AST 466* 184* 132* 187* 177*  ALT 478* 315* 243* 232* 215*  ALKPHOS 139* 131* 113 106 105  BILITOT 0.9 0.7 0.5 0.6 0.6  PROT 5.9* 6.3* 6.1* 6.1* 6.2*  ALBUMIN 2.9* 3.1* 2.9* 3.0* 3.0*   Recent Labs  Lab 10/31/23 1217  LIPASE 59*   No results for input(s): AMMONIA in the last 168 hours. CBC: Recent Labs  Lab 10/31/23 1600 11/01/23 1351 11/02/23 0334 11/03/23 1100 11/04/23 0610 11/05/23 0437  WBC 3.8* 6.5 5.1 4.1 3.7* 3.6*  NEUTROABS 3.1  --   --   --   --   --   HGB 11.6* 10.3* 10.4* 11.0* 11.2* 10.8*  HCT 35.5* 31.2* 30.9* 33.6* 34.1* 32.5*  MCV 82.2 80.4 79.2* 80.4 80.8 80.2  PLT 197 207 209 201 236 225   Cardiac Enzymes: Recent Labs  Lab 11/01/23 1027 11/02/23 0334  CKTOTAL 1,694* 743*  CKMB 7.5*  --    BNP: Invalid input(s): POCBNP CBG: No results for input(s): GLUCAP in the last 168 hours. D-Dimer No results for input(s): DDIMER in the last 72 hours. Hgb A1c No results for input(s): HGBA1C in the last 72 hours. Lipid Profile No results for input(s): CHOL, HDL, LDLCALC, TRIG, CHOLHDL, LDLDIRECT in the last 72 hours. Thyroid function studies No results for input(s): TSH, T4TOTAL, T3FREE, THYROIDAB in the last 72 hours.  Invalid input(s): [name]3 Anemia work up No results for input(s): VITAMINB12, FOLATE, FERRITIN, TIBC, IRON, RETICCTPCT in the last 72 hours. Urinalysis    Component Value Date/Time   COLORURINE YELLOW 09/18/2023 0731   APPEARANCEUR CLEAR 09/18/2023 0731   LABSPEC 1.044 (H) 09/18/2023 0731   PHURINE 5.0 09/18/2023 0731   GLUCOSEU NEGATIVE 09/18/2023 0731   GLUCOSEU NEG mg/dL 98/90/7987 7868   HGBUR MODERATE (A) 09/18/2023 0731   BILIRUBINUR NEGATIVE 09/18/2023 0731   BILIRUBINUR Negative 04/10/2012 1606   KETONESUR 5 (A) 09/18/2023 0731   PROTEINUR NEGATIVE 09/18/2023 0731   UROBILINOGEN 1.0 04/28/2014 1325    NITRITE NEGATIVE 09/18/2023 0731   LEUKOCYTESUR  NEGATIVE 09/18/2023 0731   Sepsis Labs Recent Labs  Lab 11/02/23 0334 11/03/23 1100 11/04/23 0610 11/05/23 0437  WBC 5.1 4.1 3.7* 3.6*   Microbiology Recent Results (from the past 240 hours)  Blood Culture (routine x 2)     Status: None   Collection Time: 10/31/23 12:41 PM   Specimen: BLOOD  Result Value Ref Range Status   Specimen Description BLOOD LEFT ANTECUBITAL  Final   Special Requests   Final    BOTTLES DRAWN AEROBIC ONLY Blood Culture results may not be optimal due to an inadequate volume of blood received in culture bottles   Culture   Final    NO GROWTH 5 DAYS Performed at [facility], [address], [place], [place] [address]    Report Status 11/05/2023 FINAL  Final  Gastrointestinal Panel by PCR , Stool     Status: Abnormal   Collection Time: 10/31/23  3:23 PM   Specimen: Rectum; Stool  Result Value Ref Range Status   Campylobacter species NOT DETECTED NOT DETECTED Final   Plesimonas shigelloides DETECTED (A) NOT DETECTED Final    Comment: RESULT CALLED TO, READ BACK BY AND VERIFIED WITH: DAINA DASEN., RN AT 1413 11/01/23 RAM    Salmonella species NOT DETECTED NOT DETECTED Final   Yersinia enterocolitica NOT DETECTED NOT DETECTED Final   Vibrio species NOT DETECTED NOT DETECTED Final   Vibrio cholerae NOT DETECTED NOT DETECTED Final   Enteroaggregative E coli (EAEC) NOT DETECTED NOT DETECTED Final   Enteropathogenic E coli (EPEC) DETECTED (A) NOT DETECTED Final    Comment: RESULT CALLED TO, READ BACK BY AND VERIFIED WITH: DAINA DASEN., RN AT [phone] 11/01/23 RAM    Enterotoxigenic E coli (ETEC) NOT DETECTED NOT DETECTED Final   Shiga like toxin producing E coli (STEC) NOT DETECTED NOT DETECTED Final   Shigella/Enteroinvasive E coli (EIEC) NOT DETECTED NOT DETECTED Final   Cryptosporidium NOT DETECTED NOT DETECTED Final   Cyclospora cayetanensis NOT DETECTED NOT DETECTED Final   Entamoeba histolytica NOT DETECTED  NOT DETECTED Final   Giardia lamblia NOT DETECTED NOT DETECTED Final   Adenovirus F40/41 NOT DETECTED NOT DETECTED Final   Astrovirus DETECTED (A) NOT DETECTED Final   Norovirus GI/GII NOT DETECTED NOT DETECTED Final   Rotavirus A NOT DETECTED NOT DETECTED Final   Sapovirus (I, II, IV, and V) NOT DETECTED NOT DETECTED Final    Comment: Performed at [facility], [address] [name], [place] [address]  C Difficile Quick Screen w PCR reflex     Status: None   Collection Time: 10/31/23  3:23 PM   Specimen: Rectum; Stool  Result Value Ref Range Status   C Diff antigen NEGATIVE NEGATIVE Final   C Diff toxin NEGATIVE NEGATIVE Final   C Diff interpretation No C. difficile detected.  Final    Comment: Performed at [name], [address] [place], [place] [address]  Resp panel by RT-PCR (RSV, Flu A&B, Covid) Anterior Nasal Swab     Status: None   Collection Time: 10/31/23  7:40 PM   Specimen: Anterior Nasal Swab  Result Value Ref Range Status   SARS Coronavirus 2 by RT PCR NEGATIVE NEGATIVE Final   Influenza A by PCR NEGATIVE NEGATIVE Final   Influenza B by PCR NEGATIVE NEGATIVE Final    Comment: (NOTE) The Xpert Xpress SARS-CoV-2/FLU/RSV plus assay is intended as an aid in the diagnosis of influenza from Nasopharyngeal swab specimens and should not be used  as a sole basis for treatment. Nasal washings and aspirates are unacceptable for Xpert Xpress SARS-CoV-2/FLU/RSV testing.  Fact Sheet for Patients: BloggerCourse.com  Fact Sheet for Healthcare Providers: SeriousBroker.it  This test is not yet approved or cleared by the [place]  FDA and has been authorized for detection and/or diagnosis of SARS-CoV-2 by FDA under an Emergency Use Authorization (EUA). This EUA will remain in effect (meaning this test can be used) for the duration of the COVID-19 declaration under Section 564(b)(1) of the Act, 21  U.S.C. section 360bbb-3(b)(1), unless the authorization is terminated or revoked.     Resp Syncytial Virus by PCR NEGATIVE NEGATIVE Final    Comment: (NOTE) Fact Sheet for Patients: BloggerCourse.com  Fact Sheet for Healthcare Providers: SeriousBroker.it  This test is not yet approved or cleared by the [place]  FDA and has been authorized for detection and/or diagnosis of SARS-CoV-2 by FDA under an Emergency Use Authorization (EUA). This EUA will remain in effect (meaning this test can be used) for the duration of the COVID-19 declaration under Section 564(b)(1) of the Act, 21 U.S.C. section 360bbb-3(b)(1), unless the authorization is terminated or revoked.  Performed at [facility], [address], [place], [place] [address]   Culture, blood (Routine X 2) w Reflex to ID Panel     Status: None   Collection Time: 11/01/23 10:27 AM   Specimen: BLOOD LEFT ARM  Result Value Ref Range Status   Specimen Description BLOOD LEFT ARM  Final   Special Requests   Final    BOTTLES DRAWN AEROBIC AND ANAEROBIC Blood Culture results may not be optimal due to an inadequate volume of blood received in culture bottles   Culture   Final    NO GROWTH 5 DAYS Performed at [facility], [address] [place], [place] [address]    Report Status 11/06/2023 FINAL  Final     Time coordinating discharge: Over 30 minutes  SIGNED:   Darcel Dawley, MD  [facility] 11/06/2023, 3:17 PM Pager   If 7PM-7AM, please contact night-coverage

## 2023-11-06 NOTE — Progress Notes (Signed)
   11/06/23 0807  TOC Brief Assessment  Insurance and Status Reviewed  Patient has primary care physician Yes  Prior level of function: independent  Prior/Current Home Services No current home services  Social Drivers of Health Review SDOH reviewed no interventions necessary  Readmission risk has been reviewed No  Transition of care needs no transition of care needs at this time     Transition of Care Department [facility]) has reviewed patient and no TOC needs have been identified at this time. We will continue to monitor patient advancement through interdisciplinary progression rounds. If new patient transition needs arise, please place a TOC consult.

## 2023-11-06 NOTE — Discharge Instructions (Signed)
 Advised to follow up PCP in one week. Advised to take cipro  500bid and flagyl  500 bid for 2 more days. Advised to drink more fluids

## 2023-11-06 NOTE — Evaluation (Signed)
 Occupational Therapy Evaluation and Discharge Patient Details Name: [name] MRN: [id number] DOB: 05-Feb-1940 Today's Date: 11/06/2023   History of Present Illness   Pt is an 83 y/o M admitted on 11/02/23 for acute diarrhea, abdominal pain, emesis. Diagnosed with acute hepatitis. PMH: diverticulitis, prostate adenocarcinoma s/p XRT, HTN, HLD, gout, partial SBO (Feb 2025).     Clinical Impressions Pt is typically independent in self care and mobility. Lives with his son. Presents with generalized weakness and abdominal pain. Mobilizing modified independently in room and bathroom (increased time). Completes basic ADLs modified independently. Son assists with IADLs. Encouraged regular walking when he returns home later today. No further OT needs.      If plan is discharge home, recommend the following:   Assistance with cooking/housework;Assist for transportation     Functional Status Assessment   Patient has not had a recent decline in their functional status     Equipment Recommendations   None recommended by OT     Recommendations for Other Services         Precautions/Restrictions   Restrictions Weight Bearing Restrictions Per Provider Order: No     Mobility Bed Mobility Overal bed mobility: Modified Independent                  Transfers Overall transfer level: Modified independent Equipment used: None               General transfer comment: slow to rise, no assist      Balance Overall balance assessment: Needs assistance   Sitting balance-Leahy Scale: Normal     Standing balance support: During functional activity Standing balance-Leahy Scale: Good                             ADL either performed or assessed with clinical judgement   ADL Overall ADL's : Modified independent                                             Vision Ability to See in Adequate Light: 0 Adequate Patient Visual Report:  No change from baseline       Perception         Praxis         Pertinent Vitals/Pain Pain Assessment Pain Assessment: Faces Faces Pain Scale: Hurts little more Pain Location: abdomen Pain Descriptors / Indicators: Discomfort Pain Intervention(s): Monitored during session, Repositioned     Extremity/Trunk Assessment Upper Extremity Assessment Upper Extremity Assessment: Right hand dominant;Generalized weakness   Lower Extremity Assessment Lower Extremity Assessment: Defer to PT evaluation   Cervical / Trunk Assessment Cervical / Trunk Assessment: Normal   Communication Communication Communication: Impaired Factors Affecting Communication: Hearing impaired   Cognition Arousal: Alert Behavior During Therapy: WFL for tasks assessed/performed               OT - Cognition Comments: cognition intact for activities assessed                 Following commands: Intact       Cueing  General Comments   Cueing Techniques: Verbal cues      Exercises     Shoulder Instructions      Home Living Family/patient expects to be discharged to:: Private residence Living Arrangements: Children Available Help at Discharge: Family;Available 24 hours/day Type of  Home: House Home Access: Ramped entrance     Home Layout: One level     Bathroom Shower/Tub: Chief Strategy Officer: Standard     Home Equipment: Cane - single point          Prior Functioning/Environment Prior Level of Function : Independent/Modified Independent                    OT Problem List:     OT Treatment/Interventions:        OT Goals(Current goals can be found in the care plan section)       OT Frequency:       Co-evaluation              AM-PAC OT 6 Clicks Daily Activity     Outcome Measure Help from another person eating meals?: None Help from another person taking care of personal grooming?: None Help from another person toileting, which  includes using toliet, bedpan, or urinal?: None Help from another person bathing (including washing, rinsing, drying)?: None Help from another person to put on and taking off regular upper body clothing?: None Help from another person to put on and taking off regular lower body clothing?: None 6 Click Score: 24   End of Session    Activity Tolerance: Patient tolerated treatment well Patient left: Other (comment) (walking with mobility specialist)  OT Visit Diagnosis: Muscle weakness (generalized) (M62.81)                Time: 8971-8960 OT Time Calculation (min): 11 min Charges:  OT General Charges $OT Visit: 1 Visit OT Evaluation $OT Eval Low Complexity: 1 Low Mliss HERO, OTR/L Acute Rehabilitation Services Office: [phone]  Kennth Mliss Helling 11/06/2023, 10:41 AM

## 2023-11-07 ENCOUNTER — Telehealth: Payer: Self-pay

## 2023-11-07 NOTE — Transitions of Care (Post Inpatient/ED Visit) (Signed)
   11/07/2023  Name: [name] MRN: [id number] DOB: [birth year]  Today's TOC FU Call Status: Today's TOC FU Call Status:: Unsuccessful Call (1st Attempt) Unsuccessful Call (1st Attempt) Date: 11/07/23  Attempted to reach the patient regarding the most recent Inpatient/ED visit.  Follow Up Plan: Additional outreach attempts will be made to reach the patient to complete the Transitions of Care (Post Inpatient/ED visit) call.   Shona Prow RN, CCM [place]  VBCI-Population Health RN Care Manager [phone]

## 2023-11-10 ENCOUNTER — Telehealth: Payer: Self-pay

## 2023-11-10 NOTE — Transitions of Care (Post Inpatient/ED Visit) (Signed)
   11/10/2023  Name: [name] MRN: [id number] DOB: March 26, 1940  Today's TOC FU Call Status: Today's TOC FU Call Status:: Successful TOC FU Call Completed TOC FU Call Complete Date: 11/10/23 (Completed - Spoke with son who states patient has hospital follow up 11/12/23 and has finished his meds that were just for 2 days and only other med is for pain which he hasn't needed - son denied need for [facility] program) Patient's Name and Date of Birth confirmed.  Transition Care Management Follow-up Telephone Call   Follow up appointments reviewed: PCP Follow-up appointment confirmed?: Yes Date of PCP follow-up appointment?: 11/12/23 Follow-up Provider: PCP  Shona Prow RN, CCM [place]  VBCI-Population Health RN Care Manager [phone]

## 2023-11-12 ENCOUNTER — Encounter: Payer: Self-pay | Admitting: Student

## 2023-11-12 ENCOUNTER — Ambulatory Visit (INDEPENDENT_AMBULATORY_CARE_PROVIDER_SITE_OTHER): Payer: Self-pay | Admitting: Student

## 2023-11-12 VITALS — BP 115/64 | HR 95 | Temp 97.4°F | Ht 71.0 in | Wt 165.2 lb

## 2023-11-12 DIAGNOSIS — K529 Noninfective gastroenteritis and colitis, unspecified: Secondary | ICD-10-CM

## 2023-11-12 NOTE — Progress Notes (Signed)
 [facility] Attending  Case discussed with the resident at the time of the visit.  We reviewed the resident's history and exam and pertinent patient test results.  I agree with the assessment, diagnosis, and plan of care documented in the resident's note.

## 2023-11-12 NOTE — Patient Instructions (Signed)
 Ease into your normal activities. Eat and drink as normal but avoid spicy or fried foods for a few days. Be sure to stay hydrated. I will call if your lab results show anything out of the ordinary.

## 2023-11-12 NOTE — Progress Notes (Signed)
 CC: HFU for bacterial colitis, ischemic hepatitis, AKI  HPI:  [name] is a 83 y.o. male with a PMH stated below who presents today for hospital follow up. He feels well and has no complaints.  Follow up Hospitalization  Patient was admitted to [facility] hospital on 10/3 and discharged on 10/9. He was treated for colitis, hypovolemia, ischemic hepatitis, AKI. Treatment for this included antibiotics and fluids. Telephone follow up was done on 10/13 He reports excellent compliance with treatment. He reports this condition is resolved. ----------------------------------------------------------------------------------------- - Please see problem based assessment and plan for additional details.  Past Medical History:  Diagnosis Date   Benign localized prostatic hyperplasia with lower urinary tract symptoms (LUTS)    Chronic gout    followed by pcp   Full dentures    History of colitis 2005   per colonoscopy chronic inflammation colits secondary to ascending colon ulceration   History of diverticulitis of colon    History of DVT of lower extremity 09/13/2006   LLE   History of hypertension, not currently requiring treatment 12/12/2005            History of pulmonary embolism 09/13/2006   bilateral PE secondary to DVT of LLE in 2008 On Warfarin until 01/2010 when he was admitted for syncope and noticed that he had completed course of warfarin and it was stopped.   HTN (hypertension)    followed by pcp   (05-12-2023no medication since 01/ 2023 by pt's pcp)   Hx of acute blood loss anemia due to radiation proctitis, hg normalized after transfusion 07/16/2021   Hx of aortic root dilation (HCC), resolved 01/10/2012   2012: Left ventricle: no LVH.  EF 60% to 65%.  Aorta: Mildly dilated aortic root. Aortic root dimension: 39mm  2015 : Aortic root: The aortic root was top normal in size.  2015 : Stable diffuse thoracic aortic ectasia with maximum diameter of the ascending aorta to 4.6  cm.  2017 ECHO ascending aorta nl in size     Hyperlipidemia    Malignant neoplasm prostate (HCC) 01/2021   urologist-- dr bell;  dx 01/ 2023, Gleason 4+4   Mild intermittent asthma    PFT's 05/2012 : FEV1 / FVC 75 and 78 pre and post BD. FEV1 74 and 75 pre and post BD. TLC 72. RV 93. DLCO 79. Read as restrictive lung disease, possible with obstructive lung disease. Prior smoker.     Mild intermittent asthma, uncomplicated 07/13/2013   PFT's 05/2012 : FEV1 / FVC 75 and 78 pre and post BD. FEV1 74 and 75 pre and post BD. TLC 72. RV 93. DLCO 79. Read as restrictive lung disease, possible with obstructive lung disease.  Prior smoker.    OA (osteoarthritis) of knee 11/05/2013   Partial bowel obstruction (HCC) 09/18/2023   Radiation proctitis 07/28/2021   Marked showed marked rectal thickening and perirectal stranding, findings compatible with radiation proctitis. His blood and urine culture showed no growth. He received Vanco, cefepime   and Flagyl  during hospitalization which was changed to Rocephin , and Flagyl . He was discharged on Omnicef  and Flagyl  to cover proctitis for 10 days for a total 14 days.    Recurrent gross hematuria due to radiation cystitis 10/03/2022   Syncope 07/16/2021   Review of Systems: ROS negative except for what is noted on the assessment and plan.  Vitals:   11/12/23 0848  BP: 115/64  Pulse: 95  Temp: (!) 97.4 F (36.3 C)  TempSrc: Oral  SpO2:  97%  Weight: 165 lb 3.2 oz (74.9 kg)  Height: 5' 11 (1.803 m)   Physical Exam: Constitutional: well-appearing man in no acute distress HENT: normocephalic atraumatic, mucous membranes moist Eyes: conjunctiva non-erythematous Cardiovascular: regular rate and rhythm, no m/r/g Pulmonary/Chest: normal work of breathing on room air, lungs clear to auscultation bilaterally Abdominal: soft, non-tender, non-distended MSK: normal bulk and tone Skin: warm and dry  Assessment & Plan:   Patient discussed with Dr.  Francesco  Assessment & Plan Colitis He was hospitalized after presenting to [facility] with gastrointestinal distress.  Treated with antibiotics Cipro  and Flagyl  for 5 days to resolve bacterial colitis that was C. difficile negative but positive for EPEC Shigelloides and astrovirus.  There is a mild AKI and liver damage secondary to hypovolemia.  This resolved with supportive care.  Today he feels very well.  He has no complaints.  He is eating, drinking, working into his normal routine but easing into it.  He is doing his best to stay hydrated reports that his urine is light.  He finished his antibiotic course. - Will check CMP and CBC, otherwise no interventions required at this point   Lonni Africa, D.O. [facility] Internal Medicine, PGY-2 Phone: [phone] Date 11/12/2023 Time 11:17 AM

## 2023-11-13 ENCOUNTER — Ambulatory Visit: Payer: Self-pay | Admitting: Student

## 2023-11-13 ENCOUNTER — Ambulatory Visit: Admitting: Student

## 2023-11-13 LAB — COMPREHENSIVE METABOLIC PANEL WITH GFR
ALT: 88 IU/L — ABNORMAL HIGH (ref 0–44)
AST: 56 IU/L — ABNORMAL HIGH (ref 0–40)
Albumin: 4.2 g/dL (ref 3.7–4.7)
Alkaline Phosphatase: 107 IU/L (ref 48–129)
BUN/Creatinine Ratio: 14 (ref 10–24)
BUN: 15 mg/dL (ref 8–27)
Bilirubin Total: 0.3 mg/dL (ref 0.0–1.2)
CO2: 22 mmol/L (ref 20–29)
Calcium: 9.6 mg/dL (ref 8.6–10.2)
Chloride: 99 mmol/L (ref 96–106)
Creatinine, Ser: 1.06 mg/dL (ref 0.76–1.27)
Globulin, Total: 2.9 g/dL (ref 1.5–4.5)
Glucose: 125 mg/dL — ABNORMAL HIGH (ref 70–99)
Potassium: 4.2 mmol/L (ref 3.5–5.2)
Sodium: 135 mmol/L (ref 134–144)
Total Protein: 7.1 g/dL (ref 6.0–8.5)
eGFR: 70 mL/min/1.73 (ref >59)

## 2023-11-13 LAB — CBC
Hematocrit: 38.6 % (ref 37.5–51.0)
Hemoglobin: 12.1 g/dL — ABNORMAL LOW (ref 13.0–17.7)
MCH: 27 pg (ref 26.6–33.0)
MCHC: 31.3 g/dL — ABNORMAL LOW (ref 31.5–35.7)
MCV: 86 fL (ref 79–97)
Platelets: 356 x10E3/uL (ref 150–450)
RBC: 4.48 x10E6/uL (ref 4.14–5.80)
RDW: 16 % — ABNORMAL HIGH (ref 11.6–15.4)
WBC: 4.2 x10E3/uL (ref 3.4–10.8)

## 2023-11-14 ENCOUNTER — Other Ambulatory Visit (HOSPITAL_COMMUNITY): Payer: Self-pay | Admitting: Internal Medicine

## 2023-11-14 DIAGNOSIS — R748 Abnormal levels of other serum enzymes: Secondary | ICD-10-CM

## 2023-11-14 DIAGNOSIS — R1033 Periumbilical pain: Secondary | ICD-10-CM

## 2023-12-02 ENCOUNTER — Encounter (HOSPITAL_COMMUNITY)
Admission: RE | Admit: 2023-12-02 | Discharge: 2023-12-02 | Disposition: A | Discharge status: Home / Self Care | Source: Ambulatory Visit | Attending: Internal Medicine | Admitting: Internal Medicine

## 2023-12-02 DIAGNOSIS — R1033 Periumbilical pain: Secondary | ICD-10-CM | POA: Insufficient documentation

## 2023-12-02 DIAGNOSIS — R748 Abnormal levels of other serum enzymes: Secondary | ICD-10-CM | POA: Diagnosis present

## 2023-12-02 MED ORDER — TECHNETIUM TC 99M MEBROFENIN IV KIT
5.0000 | PACK | Freq: Once | INTRAVENOUS | Status: AC | PRN
  Administered 2023-12-02: 5.09 via INTRAVENOUS

## 2024-02-11 ENCOUNTER — Ambulatory Visit (INDEPENDENT_AMBULATORY_CARE_PROVIDER_SITE_OTHER): Payer: Medicare HMO

## 2024-02-11 VITALS — Ht 71.0 in | Wt 165.0 lb

## 2024-02-11 DIAGNOSIS — Z0000 Encounter for general adult medical examination without abnormal findings: Secondary | ICD-10-CM

## 2024-02-11 NOTE — Progress Notes (Signed)
 "  Chief Complaint  Patient presents with   Medicare Wellness    SUBSEQUENT     Subjective:   [name] is a 84 y.o. male who presents for a Medicare Annual Wellness Visit.  Visit info / Clinical Intake: Medicare Wellness Visit Type:: Subsequent Annual Wellness Visit Persons participating in visit and providing information:: patient Medicare Wellness Visit Mode:: Video Since this visit was completed virtually, some vitals may be partially provided or unavailable. Missing vitals are due to the limitations of the virtual format.: Documented vitals are patient reported If Telephone or Video please confirm:: I connected with patient using audio/video enable telemedicine. I verified patient identity with two identifiers, discussed telehealth limitations, and patient agreed to proceed. Patient Location:: HOME Provider Location:: OFFICE Interpreter Needed?: No Pre-visit prep was completed: yes AWV questionnaire completed by patient prior to visit?: no Living arrangements:: (!) lives alone Patient's Overall Health Status Rating: (!) fair Typical amount of pain: none Does pain affect daily life?: no Are you currently prescribed opioids?: no  Dietary Habits and Nutritional Risks How many meals a day?: 3 Eats fruit and vegetables daily?: yes Most meals are obtained by: preparing own meals; eating out; having others provide food In the last 2 weeks, have you had any of the following?: none Diabetic:: no  Functional Status Activities of Daily Living (to include ambulation/medication): (!) Needs Assist Feeding: Independent Dressing/Grooming: Independent Bathing: Independent Toileting: Independent Transfer: Independent Ambulation: Independent Home Assistive Devices/Equipment: Eyeglasses; Other (Comment) (HEARING AIDS) Medication Administration: Independent Home Management (perform basic housework or laundry): Independent Manage your own finances?: yes Primary transportation is:  driving Concerns about vision?: no *vision screening is required for [facility]* [facility]) Concerns about hearing?: (!) yes Uses hearing aids?: (!) yes  Fall Screening Falls in the past year?: 0 Number of falls in past year: 0 Was there an injury with Fall?: 0 Fall Risk Category Calculator: 0 Patient Fall Risk Level: Low Fall Risk  Fall Risk Patient at Risk for Falls Due to: No Fall Risks Fall risk Follow up: Falls evaluation completed; Education provided  Home and Transportation Safety: All rugs have non-skid backing?: N/A, no rugs All stairs or steps have railings?: N/A, no stairs Grab bars in the bathtub or shower?: (!) no Have non-skid surface in bathtub or shower?: (!) no Good home lighting?: yes Regular seat belt use?: yes Hospital stays in the last year:: (!) yes How many hospital stays:: 3 Reason: AKI (twice) and SBO  Cognitive Assessment Difficulty concentrating, remembering, or making decisions? : no Will 6CIT or Mini Cog be Completed: yes Which version was used?: Version1 : banana, sunrise, chair How many words correct?: 3 Which version was used?: Version 4: river, nation, finger What year is it?: 0 points What month is it?: 0 points Give patient an address phrase to remember (5 components): [address] [place] About what time is it?: 0 points Count backwards from 20 to 1: 0 points Say the months of the year in reverse: 0 points Repeat the address phrase from earlier: 0 points 6 CIT Score: 0 points  Advance Directives (For Healthcare) Does Patient Have a Medical Advance Directive?: No Would patient like information on creating a medical advance directive?: No - Patient declined (PENDING)  Reviewed/Updated  Reviewed/Updated: Reviewed All (Medical, Surgical, Family, Medications, Allergies, Care Teams, Patient Goals)    Allergies (verified) Patient has no known allergies.   Current Medications (verified) No outpatient encounter medications on  file as of 02/11/2024.  No facility-administered encounter medications on file as of 02/11/2024.    History: Past Medical History:  Diagnosis Date   Benign localized prostatic hyperplasia with lower urinary tract symptoms (LUTS)    Chronic gout    followed by pcp   Full dentures    History of colitis 2005   per colonoscopy chronic inflammation colits secondary to ascending colon ulceration   History of diverticulitis of colon    History of DVT of lower extremity 09/13/2006   LLE   History of hypertension, not currently requiring treatment 12/12/2005            History of pulmonary embolism 09/13/2006   bilateral PE secondary to DVT of LLE in 2008 On Warfarin until 01/2010 when he was admitted for syncope and noticed that he had completed course of warfarin and it was stopped.   HTN (hypertension)    followed by pcp   (05-12-2023no medication since 01/ 2023 by pt's pcp)   Hx of acute blood loss anemia due to radiation proctitis, hg normalized after transfusion 07/16/2021   Hx of aortic root dilation (HCC), resolved 01/10/2012   2012: Left ventricle: no LVH.  EF 60% to 65%.  Aorta: Mildly dilated aortic root. Aortic root dimension: 39mm  2015 : Aortic root: The aortic root was top normal in size.  2015 : Stable diffuse thoracic aortic ectasia with maximum diameter of the ascending aorta to 4.6 cm.  2017 ECHO ascending aorta nl in size     Hyperlipidemia    Malignant neoplasm prostate (HCC) 01/2021   urologist-- dr bell;  dx 01/ 2023, Gleason 4+4   Mild intermittent asthma    PFT's 05/2012 : FEV1 / FVC 75 and 78 pre and post BD. FEV1 74 and 75 pre and post BD. TLC 72. RV 93. DLCO 79. Read as restrictive lung disease, possible with obstructive lung disease. Prior smoker.     Mild intermittent asthma, uncomplicated 07/13/2013   PFT's 05/2012 : FEV1 / FVC 75 and 78 pre and post BD. FEV1 74 and 75 pre and post BD. TLC 72. RV 93. DLCO 79. Read as restrictive lung disease, possible with  obstructive lung disease.  Prior smoker.    OA (osteoarthritis) of knee 11/05/2013   Partial bowel obstruction (HCC) 09/18/2023   Radiation proctitis 07/28/2021   Marked showed marked rectal thickening and perirectal stranding, findings compatible with radiation proctitis. His blood and urine culture showed no growth. He received Vanco, cefepime   and Flagyl  during hospitalization which was changed to Rocephin , and Flagyl . He was discharged on Omnicef  and Flagyl  to cover proctitis for 10 days for a total 14 days.    Recurrent gross hematuria due to radiation cystitis 10/03/2022   Syncope 07/16/2021   Past Surgical History:  Procedure Laterality Date   GOLD SEED IMPLANT N/A 06/13/2021   Procedure: GOLD SEED IMPLANT;  Surgeon: Carolee Sherwood JONETTA DOUGLAS, MD;  Location: [facility];  Service: Urology;  Laterality: N/A;   I & D EXTREMITY Right 03/04/2014   Procedure: IRRIGATION AND DEBRIDEMENT FINGER / HAND;  Surgeon: Balinda Rogue, MD;  Location: [facility] OR;  Service: Plastics;  Laterality: Right;  I&D Right Small Finger and Right Wrist   KNEE ARTHROSCOPY Left 2000   SPACE OAR INSTILLATION N/A 06/13/2021   Procedure: SPACE OAR INSTILLATION;  Surgeon: Carolee Sherwood JONETTA DOUGLAS, MD;  Location: [facility];  Service: Urology;  Laterality: N/A;   TRANSURETHRAL RESECTION OF PROSTATE  04/01/2003   @[facility]   by  dr andra   Family History  Problem Relation Age of Onset   Heart disease Father    Stroke Father    Heart disease Brother 44   Social History   Occupational History   Not on file  Tobacco Use   Smoking status: Never   Smokeless tobacco: Never  Vaping Use   Vaping status: Never Used  Substance and Sexual Activity   Alcohol use: No   Drug use: Never   Sexual activity: Not on file   Tobacco Counseling Counseling given: Not Answered  SDOH Screenings   Food Insecurity: No Food Insecurity (02/11/2024)  Housing: Low Risk (02/11/2024)  Transportation Needs: No Transportation  Needs (02/11/2024)  Utilities: Not At Risk (02/11/2024)  Alcohol Screen: Low Risk (02/05/2023)  Depression (PHQ2-9): Low Risk (02/11/2024)  Financial Resource Strain: Low Risk (02/11/2024)  Physical Activity: Inactive (02/11/2024)  Social Connections: Moderately Integrated (02/11/2024)  Stress: No Stress Concern Present (02/11/2024)  Tobacco Use: Low Risk (02/11/2024)  Health Literacy: Adequate Health Literacy (02/11/2024)   See flowsheets for full screening details  Depression Screen PHQ 2 & 9 Depression Scale- Over the past 2 weeks, how often have you been bothered by any of the following problems? Little interest or pleasure in doing things: 0 Feeling down, depressed, or hopeless (PHQ Adolescent also includes...irritable): 1 PHQ-2 Total Score: 1 Trouble falling or staying asleep, or sleeping too much: 0 (SLEEPING WELL; VOIDS 3-4 TIMES PER NIGHT) Feeling tired or having little energy: 1 (C/O WEAKNESS) Poor appetite or overeating (PHQ Adolescent also includes...weight loss): 0 Feeling bad about yourself - or that you are a failure or have let yourself or your family down: 0 Trouble concentrating on things, such as reading the newspaper or watching television (PHQ Adolescent also includes...like school work): 1 Moving or speaking so slowly that other people could have noticed. Or the opposite - being so fidgety or restless that you have been moving around a lot more than usual: 0 Thoughts that you would be better off dead, or of hurting yourself in some way: 0 PHQ-9 Total Score: 3 If you checked off any problems, how difficult have these problems made it for you to do your work, take care of things at home, or get along with other people?: Not difficult at all  Depression Treatment Depression Interventions/Treatment : EYV7-0 Score <4 Follow-up Not Indicated     Goals Addressed             This Visit's Progress    02/11/2024: My goal is to stay well, maintain my health, continue to believe  and lean on God.               Objective:    Today's Vitals   02/11/24 1553  Weight: 165 lb (74.8 kg)  Height: 5' 11 (1.803 m)  PainSc: 0-No pain   Body mass index is 23.01 kg/m.  Hearing/Vision screen Hearing Screening - Comments:: Patient wears hearing aids. Vision Screening - Comments:: Patient wears eyeglasses.  Eye exam completed yearly by My Phebe Daniels, OD with [facility]. Immunizations and Health Maintenance Health Maintenance  Topic Date Due   Zoster Vaccines- Shingrix (1 of 2) 06/08/1960   COVID-19 Vaccine (4 - 2025-26 season) 09/29/2023   Medicare Annual Wellness (AWV)  02/10/2025   DTaP/Tdap/Td (3 - Td or Tdap) 06/02/2030   Pneumococcal Vaccine: 50+ Years  Completed   Influenza Vaccine  Completed   Meningococcal B Vaccine  Aged Out   Hepatitis C Screening  Discontinued  Assessment/Plan:  This is a routine wellness examination for Luismiguel.  Patient Care Team: [name], MD as PCP - General (Internal Medicine) Carolee Sherwood JONETTA DOUGLAS, MD as Consulting Physician (Urology) Patrcia Cough, MD as Consulting Physician (Radiation Oncology) Crawford, Morna Pickle, NP as Nurse Practitioner (Hematology and Oncology) Starla Wendelyn JONETTA, RN as Registered Nurse [place], My [place], [place] as Referring Physician (Optometry)  I have personally reviewed and noted the following in the patients chart:   Medical and social history Use of alcohol, tobacco or illicit drugs  Current medications and supplements including opioid prescriptions. Functional ability and status Nutritional status Physical activity Advanced directives List of other physicians Hospitalizations, surgeries, and ER visits in previous 12 months Vitals Screenings to include cognitive, depression, and falls Referrals and appointments  No orders of the defined types were placed in this encounter.  In addition, I have reviewed and discussed with patient certain preventive protocols, quality metrics,  and best practice recommendations. A written personalized care plan for preventive services as well as general preventive health recommendations were provided to patient.   Roz LOISE Fuller, LPN   8/85/7973   Return in about 1 year (around 02/10/2025) for Medicare wellness.  After Visit Summary: (MyChart) Due to this being a telephonic visit, the after visit summary with patients personalized plan was offered to patient via MyChart   Nurse Notes: Patient aware of current care gaps.  Patient is due for the following: Shingrix and Covid-19.  This nurse requested eye exam from Optometrist. "

## 2024-02-11 NOTE — Patient Instructions (Signed)
 [name],  Thank you for taking the time for your Medicare Wellness Visit. I appreciate your continued commitment to your health goals. Please review the care plan we discussed, and feel free to reach out if I can assist you further.  Please note that Annual Wellness Visits do not include a physical exam. Some assessments may be limited, especially if the visit was conducted virtually. If needed, we may recommend an in-person follow-up with your provider.  Ongoing Care Seeing your primary care provider every 3 to 6 months helps us  monitor your health and provide consistent, personalized care.   Referrals If a referral was made during today's visit and you haven't received any updates within two weeks, please contact the referred provider directly to check on the status.  Recommended Screenings:  Health Maintenance  Topic Date Due   Zoster (Shingles) Vaccine (1 of 2) 06/08/1959   COVID-19 Vaccine (4 - 2025-26 season) 09/29/2023   Medicare Annual Wellness Visit  02/05/2024   DTaP/Tdap/Td vaccine (3 - Td or Tdap) 06/02/2030   Pneumococcal Vaccine for age over 51  Completed   Flu Shot  Completed   Meningitis B Vaccine  Aged Out   Hepatitis C Screening  Discontinued       02/11/2024    3:44 PM  Advanced Directives  Does Patient Have a Medical Advance Directive? No  Would patient like information on creating a medical advance directive? No - Patient declined    Vision: Annual vision screenings are recommended for early detection of glaucoma, cataracts, and diabetic retinopathy. These exams can also reveal signs of chronic conditions such as diabetes and high blood pressure.  Dental: Annual dental screenings help detect early signs of oral cancer, gum disease, and other conditions linked to overall health, including heart disease and diabetes.  Please see the attached documents for additional preventive care recommendations.
